# Patient Record
Sex: Female | Born: 1960 | Race: White | Hispanic: No | Marital: Married | Smoking: Never smoker
Health system: Southern US, Community
[De-identification: ages and names within clinical notes are randomized; demographics above are authoritative.]

## PROBLEM LIST (undated history)

## (undated) DIAGNOSIS — C9590 Leukemia, unspecified not having achieved remission: Secondary | ICD-10-CM

## (undated) DIAGNOSIS — I10 Essential (primary) hypertension: Secondary | ICD-10-CM

## (undated) DIAGNOSIS — E119 Type 2 diabetes mellitus without complications: Secondary | ICD-10-CM

## (undated) DIAGNOSIS — E039 Hypothyroidism, unspecified: Secondary | ICD-10-CM

## (undated) HISTORY — PX: MOHS SURGERY: SHX181

## (undated) HISTORY — DX: Essential (primary) hypertension: I10

## (undated) HISTORY — DX: Leukemia, unspecified not having achieved remission: C95.90

## (undated) HISTORY — DX: Hypothyroidism, unspecified: E03.9

## (undated) HISTORY — PX: OTHER SURGICAL HISTORY: SHX169

---

## 1999-07-09 ENCOUNTER — Other Ambulatory Visit: Admission: RE | Admit: 1999-07-09 | Discharge: 1999-07-09 | Payer: Self-pay | Admitting: Emergency Medicine

## 2000-01-15 HISTORY — PX: REDUCTION MAMMAPLASTY: SUR839

## 2000-11-11 ENCOUNTER — Encounter (INDEPENDENT_AMBULATORY_CARE_PROVIDER_SITE_OTHER): Payer: Self-pay

## 2000-11-11 ENCOUNTER — Other Ambulatory Visit: Admission: RE | Admit: 2000-11-11 | Discharge: 2000-11-11 | Payer: Self-pay | Admitting: Plastic Surgery

## 2005-09-16 ENCOUNTER — Emergency Department (HOSPITAL_COMMUNITY): Admission: EM | Admit: 2005-09-16 | Discharge: 2005-09-16 | Payer: Self-pay | Admitting: Emergency Medicine

## 2008-04-11 ENCOUNTER — Ambulatory Visit: Payer: Self-pay | Admitting: Urology

## 2009-05-09 ENCOUNTER — Ambulatory Visit (HOSPITAL_COMMUNITY): Admission: RE | Admit: 2009-05-09 | Discharge: 2009-05-09 | Payer: Self-pay | Admitting: Nurse Practitioner

## 2009-06-14 ENCOUNTER — Ambulatory Visit (HOSPITAL_COMMUNITY): Admission: RE | Admit: 2009-06-14 | Discharge: 2009-06-14 | Payer: Self-pay | Admitting: Nurse Practitioner

## 2009-06-23 ENCOUNTER — Ambulatory Visit (HOSPITAL_COMMUNITY): Admission: RE | Admit: 2009-06-23 | Discharge: 2009-06-23 | Payer: Self-pay | Admitting: Nurse Practitioner

## 2010-02-04 ENCOUNTER — Encounter: Payer: Self-pay | Admitting: Occupational Therapy

## 2010-07-03 ENCOUNTER — Ambulatory Visit (INDEPENDENT_AMBULATORY_CARE_PROVIDER_SITE_OTHER): Payer: Managed Care, Other (non HMO) | Admitting: Gastroenterology

## 2010-07-03 ENCOUNTER — Encounter: Payer: Self-pay | Admitting: Gastroenterology

## 2010-07-03 VITALS — BP 120/78 | HR 77 | Temp 97.4°F | Ht 63.0 in | Wt 143.8 lb

## 2010-07-03 DIAGNOSIS — Z1211 Encounter for screening for malignant neoplasm of colon: Secondary | ICD-10-CM | POA: Insufficient documentation

## 2010-07-03 DIAGNOSIS — K589 Irritable bowel syndrome without diarrhea: Secondary | ICD-10-CM

## 2010-07-03 NOTE — Assessment & Plan Note (Signed)
Usually does well with avoiding trigger foods. Does not feel she needs to have any antispasmodics for her. Would advise high fiber diet.

## 2010-07-03 NOTE — Progress Notes (Signed)
Primary Care Physician:  Ninfa Linden, NP, NP  Primary Gastroenterologist:  Jonette Eva, MD  Chief Complaint  Patient presents with  . Colonoscopy    HPI:  Kristin Good is a 50 y.o. female here to schedule colonoscopy. She has a history of irritable bowel syndrome. She usually controls it with her diet. Denies any melena or rectal bleeding. No abdominal pain, vomiting, heartburn, dysphagia. No family history of colon cancer. No prior history of colonoscopy.  Current Outpatient Prescriptions  Medication Sig Dispense Refill  . calcium citrate-vitamin D (CITRACAL+D) 315-200 MG-UNIT per tablet Take 1 tablet by mouth 2 (two) times daily.        Marland Kitchen levothyroxine (SYNTHROID, LEVOTHROID) 50 MCG tablet Take 50 mcg by mouth daily.        Marland Kitchen losartan (COZAAR) 50 MG tablet Take 50 mg by mouth daily.        Marland Kitchen triamterene-hydrochlorothiazide (MAXZIDE-25) 37.5-25 MG per tablet Take 1 tablet by mouth daily.          Allergies as of 07/03/2010 - Review Complete 07/03/2010  Allergen Reaction Noted  . Penicillins  07/03/2010  . Sulfa antibiotics  07/03/2010    Past Medical History  Diagnosis Date  . Hypothyroidism   . HTN (hypertension)   . Leukemia     age 31    Past Surgical History  Procedure Date  . Infertility surgery   . Mohs surgery     basal cell carcinoma    Family History  Problem Relation Age of Onset  . Colon cancer Neg Hx   . Liver disease Neg Hx   . Heart disease Mother   . Diabetes Father     History   Social History  . Marital Status: Married    Spouse Name: N/A    Number of Children: 0  . 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 Smoking status: Never Smoker   . Smokeless tobacco: Not on file  . Alcohol Use: Yes     once or twice a year  . Drug Use: No  . Sexually Active: Not on file     ROS:  General: Negative for anorexia, weight loss, fever, chills, fatigue,  weakness. Eyes: Negative for vision changes.  ENT: Negative for hoarseness, difficulty swallowing , nasal congestion. CV: Negative for chest pain, angina, palpitations, dyspnea on exertion, peripheral edema.  Respiratory: Negative for dyspnea at rest, dyspnea on exertion, cough, sputum, wheezing.  GI: See history of present illness. GU:  Negative for dysuria, hematuria, urinary incontinence, urinary frequency, nocturnal urination.  MS: Negative for joint pain, low back pain.  Derm: Negative for rash or itching.  Neuro: Negative for weakness, abnormal sensation, seizure, frequent headaches, memory loss, confusion.  Psych: Negative for anxiety, depression, suicidal ideation, hallucinations.  Endo: Negative for unusual weight change.  Heme: Negative for bruising or bleeding. Allergy: Negative for rash or hives.    Physical Examination:  BP 120/78  Pulse 77  Temp(Src) 97.4 F (36.3 C) (Temporal)  Ht 5\' 3"  (1.6 m)  Wt 143 lb 12.8 oz (65.227 kg)  BMI 25.47 kg/m2   General: Well-nourished, well-developed in no acute distress.  Head: Normocephalic, atraumatic.   Eyes: Conjunctiva pink, no icterus. Mouth: Oropharyngeal mucosa moist and pink , no lesions erythema or exudate. Neck: Supple without thyromegaly, masses, or lymphadenopathy.  Lungs: Clear to auscultation bilaterally.  Heart: Regular rate and rhythm,  no murmurs rubs or gallops.  Abdomen: Bowel sounds are normal, nontender, nondistended, no hepatosplenomegaly or masses, no abdominal bruits or    hernia , no rebound or guarding.   Extremities: No lower extremity edema.  Neuro: Alert and oriented x 4 , grossly normal neurologically.  Skin: Warm and dry, no rash or jaundice.   Psych: Alert and cooperative, normal mood and affect.

## 2010-07-03 NOTE — Progress Notes (Signed)
Cc to PCP 

## 2010-07-03 NOTE — Assessment & Plan Note (Signed)
Due for first time screening colonoscopy. I have discussed the risks, alternatives, benefits with regards to but not limited to the risk of reaction to medication, bleeding, infection, perforation and the patient is agreeable to proceed. Written consent to be obtained.

## 2010-07-10 NOTE — Progress Notes (Signed)
agree

## 2010-07-26 MED ORDER — SODIUM CHLORIDE 0.45 % IV SOLN: Freq: Once | INTRAVENOUS | Status: DC

## 2010-07-27 ENCOUNTER — Encounter (HOSPITAL_COMMUNITY)
Admission: RE | Disposition: A | Discharge status: Home / Self Care | Payer: Self-pay | Source: Ambulatory Visit | Attending: Gastroenterology

## 2010-07-27 ENCOUNTER — Encounter: Payer: Managed Care, Other (non HMO) | Admitting: Gastroenterology

## 2010-07-27 ENCOUNTER — Encounter (HOSPITAL_COMMUNITY): Payer: Self-pay | Admitting: *Deleted

## 2010-07-27 ENCOUNTER — Ambulatory Visit (HOSPITAL_COMMUNITY)
Admission: RE | Admit: 2010-07-27 | Discharge: 2010-07-27 | Disposition: A | Discharge status: Home / Self Care | Payer: Managed Care, Other (non HMO) | Source: Ambulatory Visit | Attending: Gastroenterology | Admitting: Gastroenterology

## 2010-07-27 DIAGNOSIS — K648 Other hemorrhoids: Secondary | ICD-10-CM | POA: Insufficient documentation

## 2010-07-27 DIAGNOSIS — Z1211 Encounter for screening for malignant neoplasm of colon: Secondary | ICD-10-CM

## 2010-07-27 HISTORY — PX: COLONOSCOPY: SHX5424

## 2010-07-27 SURGERY — COLONOSCOPY
Anesthesia: Moderate Sedation

## 2010-07-27 MED ORDER — MIDAZOLAM HCL 5 MG/5ML IJ SOLN
INTRAMUSCULAR | Status: AC
  Filled 2010-07-27: qty 5

## 2010-07-27 MED ORDER — MEPERIDINE HCL 100 MG/2ML IJ SOLN
INTRAMUSCULAR | Status: DC | PRN
  Administered 2010-07-27: 25 mg via INTRAMUSCULAR
  Administered 2010-07-27: 50 mg via INTRAMUSCULAR

## 2010-07-27 MED ORDER — MIDAZOLAM HCL 5 MG/5ML IJ SOLN
INTRAMUSCULAR | Status: DC | PRN
  Administered 2010-07-27 (×2): 2 mg via INTRAVENOUS

## 2010-07-27 MED ORDER — MEPERIDINE HCL 100 MG/ML IJ SOLN
INTRAMUSCULAR | Status: AC
  Filled 2010-07-27: qty 1

## 2010-07-27 MED ORDER — STERILE WATER FOR IRRIGATION IR SOLN
Status: DC | PRN
  Administered 2010-07-27: 09:00:00

## 2010-07-27 MED FILL — Meperidine HCl Inj 50 MG/ML: INTRAMUSCULAR | Qty: 1 | Status: AC

## 2010-07-27 NOTE — H&P (Signed)
No changes

## 2010-07-27 NOTE — Progress Notes (Signed)
error 

## 2010-08-02 NOTE — H&P (Signed)
Tana Coast, PA  07/03/2010  2:31 PM  Signed Primary Care Physician:  Ninfa Linden, NP, NP   Primary Gastroenterologist:  Jonette Eva, MD    Chief Complaint   Patient presents with   .  Colonoscopy      HPI:  Kristin Good is a 50 y.o. female here to schedule colonoscopy. She has a history of irritable bowel syndrome. She usually controls it with her diet. Denies any melena or rectal bleeding. No abdominal pain, vomiting, heartburn, dysphagia. No family history of colon cancer. No prior history of colonoscopy.    Current Outpatient Prescriptions   Medication  Sig  Dispense  Refill   .  calcium citrate-vitamin D (CITRACAL+D) 315-200 MG-UNIT per tablet  Take 1 tablet by mouth 2 (two) times daily.           Marland Kitchen  levothyroxine (SYNTHROID, LEVOTHROID) 50 MCG tablet  Take 50 mcg by mouth daily.           Marland Kitchen  losartan (COZAAR) 50 MG tablet  Take 50 mg by mouth daily.           Marland Kitchen  triamterene-hydrochlorothiazide (MAXZIDE-25) 37.5-25 MG per tablet  Take 1 tablet by mouth daily.               Allergies as of 07/03/2010 - Review Complete 07/03/2010   Allergen  Reaction  Noted   .  Penicillins    07/03/2010   .  Sulfa antibiotics    07/03/2010       Past Medical History   Diagnosis  Date   .  Hypothyroidism     .  HTN (hypertension)     .  Leukemia         age 66       Past Surgical History   Procedure  Date   .  Infertility surgery     .  Mohs surgery         basal cell carcinoma       Family History   Problem  Relation  Age of Onset   .  Colon cancer  Neg Hx     .  Liver disease  Neg Hx     .  Heart disease  Mother     .  Diabetes  Father         History       Social History   .  Marital Status:  Married       Spouse Name:  N/A       Number of Children:  0   .  Years of Education:  N/A       Occupational History   .  [employer]         Nurse       Social History Main Topics   .  Smoking status:  Never Smoker    .  Smokeless tobacco:  Not on file    .  Alcohol Use:  Yes         once or twice a year   .  Drug Use:  No   .  Sexually Active:  Not on file       ROS:   General: Negative for anorexia, weight loss, fever, chills, fatigue, weakness. Eyes: Negative for vision changes.   ENT: Negative for hoarseness, difficulty swallowing , nasal congestion. CV: Negative for chest pain, angina, palpitations, dyspnea on exertion, peripheral edema.   Respiratory: Negative for dyspnea at rest,  dyspnea on exertion, cough, sputum, wheezing.   GI: See history of present illness. GU:  Negative for dysuria, hematuria, urinary incontinence, urinary frequency, nocturnal urination.   MS: Negative for joint pain, low back pain.   Derm: Negative for rash or itching.   Neuro: Negative for weakness, abnormal sensation, seizure, frequent headaches, memory loss, confusion.   Psych: Negative for anxiety, depression, suicidal ideation, hallucinations.   Endo: Negative for unusual weight change.   Heme: Negative for bruising or bleeding. Allergy: Negative for rash or hives.     Physical Examination:   BP 120/78  Pulse 77  Temp(Src) 97.4 F (36.3 C) (Temporal)  Ht 5\' 3"  (1.6 m)  Wt 143 lb 12.8 oz (65.227 kg)  BMI 25.47 kg/m2    General: Well-nourished, well-developed in no acute distress.   Head: Normocephalic, atraumatic.    Eyes: Conjunctiva pink, no icterus. Mouth: Oropharyngeal mucosa moist and pink , no lesions erythema or exudate. Neck: Supple without thyromegaly, masses, or lymphadenopathy.   Lungs: Clear to auscultation bilaterally.   Heart: Regular rate and rhythm, no murmurs rubs or gallops.   Abdomen: Bowel sounds are normal, nontender, nondistended, no hepatosplenomegaly or masses, no abdominal bruits or    hernia , no rebound or guarding.    Extremities: No lower extremity edema.   Neuro: Alert and oriented x 4 , grossly normal neurologically.   Skin: Warm and dry, no rash or jaundice.    Psych: Alert and cooperative, normal mood  and affect.          Glendora Score  07/03/2010  2:41 PM  Signed Cc to PCP  Jonette Eva, MD  07/10/2010 11:02 AM  Signed agree        IBS (irritable bowel syndrome) - Tana Coast, PA  07/03/2010  1:52 PM  Signed Usually does well with avoiding trigger foods. Does not feel she needs to have any antispasmodics for her. Would advise high fiber diet.  Colon cancer screening - Tana Coast, PA  07/03/2010  1:56 PM  Signed Due for first time screening colonoscopy. I have discussed the risks, alternatives, benefits with regards to but not limited to the risk of reaction to medication, bleeding, infection, perforation and the patient is agreeable to proceed. Written consent to be obtained.

## 2010-08-08 ENCOUNTER — Encounter (HOSPITAL_COMMUNITY): Payer: Self-pay | Admitting: Gastroenterology

## 2010-10-23 ENCOUNTER — Other Ambulatory Visit (HOSPITAL_COMMUNITY): Payer: Self-pay | Admitting: Nurse Practitioner

## 2010-10-23 DIAGNOSIS — Z139 Encounter for screening, unspecified: Secondary | ICD-10-CM

## 2010-11-15 ENCOUNTER — Ambulatory Visit (HOSPITAL_COMMUNITY): Payer: Managed Care, Other (non HMO)

## 2011-07-11 ENCOUNTER — Ambulatory Visit (HOSPITAL_COMMUNITY)
Admission: RE | Admit: 2011-07-11 | Discharge: 2011-07-11 | Disposition: A | Discharge status: Home / Self Care | Payer: Managed Care, Other (non HMO) | Source: Ambulatory Visit | Attending: Nurse Practitioner | Admitting: Nurse Practitioner

## 2011-07-11 DIAGNOSIS — Z139 Encounter for screening, unspecified: Secondary | ICD-10-CM

## 2011-07-11 DIAGNOSIS — Z1231 Encounter for screening mammogram for malignant neoplasm of breast: Secondary | ICD-10-CM | POA: Insufficient documentation

## 2014-03-14 ENCOUNTER — Ambulatory Visit: Payer: Self-pay | Admitting: Physician Assistant

## 2015-03-29 ENCOUNTER — Other Ambulatory Visit: Payer: Self-pay | Admitting: Physician Assistant

## 2015-03-29 DIAGNOSIS — Z1231 Encounter for screening mammogram for malignant neoplasm of breast: Secondary | ICD-10-CM

## 2015-04-03 ENCOUNTER — Ambulatory Visit
Admission: RE | Admit: 2015-04-03 | Discharge: 2015-04-03 | Disposition: A | Discharge status: Home / Self Care | Payer: Commercial Managed Care - PPO | Source: Ambulatory Visit | Attending: Physician Assistant | Admitting: Physician Assistant

## 2015-04-03 DIAGNOSIS — Z1231 Encounter for screening mammogram for malignant neoplasm of breast: Secondary | ICD-10-CM | POA: Insufficient documentation

## 2016-04-01 ENCOUNTER — Other Ambulatory Visit: Payer: Self-pay | Admitting: Physician Assistant

## 2016-04-01 DIAGNOSIS — Z1231 Encounter for screening mammogram for malignant neoplasm of breast: Secondary | ICD-10-CM

## 2016-04-10 ENCOUNTER — Ambulatory Visit: Payer: Managed Care, Other (non HMO) | Attending: Physician Assistant

## 2016-05-17 ENCOUNTER — Telehealth: Payer: Self-pay | Admitting: Emergency Medicine

## 2016-05-17 NOTE — Telephone Encounter (Signed)
I received a message from answering service that "Kristin Good" has a critical INR. It is unclear if Kristin Good or Kristin Good has the critical INR. I attempted to call the patient back and left a voicemail requesting that they call back so I can discuss this with them. There are no INR lab results in system for me to review.

## 2016-09-11 ENCOUNTER — Ambulatory Visit
Admission: RE | Admit: 2016-09-11 | Discharge: 2016-09-11 | Disposition: A | Discharge status: Home / Self Care | Payer: Commercial Managed Care - PPO | Source: Ambulatory Visit | Attending: Physician Assistant | Admitting: Physician Assistant

## 2016-09-11 DIAGNOSIS — Z1231 Encounter for screening mammogram for malignant neoplasm of breast: Secondary | ICD-10-CM | POA: Insufficient documentation

## 2017-09-03 ENCOUNTER — Other Ambulatory Visit: Payer: Self-pay | Admitting: Physician Assistant

## 2017-09-03 DIAGNOSIS — Z1231 Encounter for screening mammogram for malignant neoplasm of breast: Secondary | ICD-10-CM

## 2017-09-30 ENCOUNTER — Inpatient Hospital Stay: Admission: RE | Admit: 2017-09-30 | Payer: Managed Care, Other (non HMO) | Source: Ambulatory Visit

## 2017-10-08 ENCOUNTER — Ambulatory Visit
Admission: RE | Admit: 2017-10-08 | Discharge: 2017-10-08 | Disposition: A | Discharge status: Home / Self Care | Payer: Commercial Managed Care - PPO | Source: Ambulatory Visit | Attending: Physician Assistant | Admitting: Physician Assistant

## 2017-10-08 DIAGNOSIS — Z1231 Encounter for screening mammogram for malignant neoplasm of breast: Secondary | ICD-10-CM | POA: Diagnosis not present

## 2018-01-18 ENCOUNTER — Emergency Department (HOSPITAL_COMMUNITY): Payer: Commercial Managed Care - PPO

## 2018-01-18 ENCOUNTER — Encounter (HOSPITAL_COMMUNITY): Payer: Self-pay | Admitting: Emergency Medicine

## 2018-01-18 ENCOUNTER — Other Ambulatory Visit: Payer: Self-pay

## 2018-01-18 ENCOUNTER — Inpatient Hospital Stay (HOSPITAL_COMMUNITY)
Admission: EM | Admit: 2018-01-18 | Discharge: 2018-01-24 | DRG: 840 | Disposition: A | Discharge status: Home / Self Care | Payer: Commercial Managed Care - PPO | Attending: Internal Medicine | Admitting: Internal Medicine

## 2018-01-18 DIAGNOSIS — I10 Essential (primary) hypertension: Secondary | ICD-10-CM | POA: Diagnosis not present

## 2018-01-18 DIAGNOSIS — Z79890 Hormone replacement therapy (postmenopausal): Secondary | ICD-10-CM

## 2018-01-18 DIAGNOSIS — D763 Other histiocytosis syndromes: Secondary | ICD-10-CM | POA: Diagnosis present

## 2018-01-18 DIAGNOSIS — C8590 Non-Hodgkin lymphoma, unspecified, unspecified site: Principal | ICD-10-CM | POA: Diagnosis present

## 2018-01-18 DIAGNOSIS — D61818 Other pancytopenia: Secondary | ICD-10-CM

## 2018-01-18 DIAGNOSIS — Z794 Long term (current) use of insulin: Secondary | ICD-10-CM

## 2018-01-18 DIAGNOSIS — E785 Hyperlipidemia, unspecified: Secondary | ICD-10-CM | POA: Diagnosis present

## 2018-01-18 DIAGNOSIS — Z923 Personal history of irradiation: Secondary | ICD-10-CM

## 2018-01-18 DIAGNOSIS — N179 Acute kidney failure, unspecified: Secondary | ICD-10-CM | POA: Diagnosis not present

## 2018-01-18 DIAGNOSIS — R740 Nonspecific elevation of levels of transaminase and lactic acid dehydrogenase [LDH]: Secondary | ICD-10-CM | POA: Diagnosis not present

## 2018-01-18 DIAGNOSIS — E43 Unspecified severe protein-calorie malnutrition: Secondary | ICD-10-CM

## 2018-01-18 DIAGNOSIS — R509 Fever, unspecified: Secondary | ICD-10-CM | POA: Diagnosis not present

## 2018-01-18 DIAGNOSIS — E860 Dehydration: Secondary | ICD-10-CM | POA: Insufficient documentation

## 2018-01-18 DIAGNOSIS — Z85820 Personal history of malignant melanoma of skin: Secondary | ICD-10-CM | POA: Diagnosis not present

## 2018-01-18 DIAGNOSIS — E039 Hypothyroidism, unspecified: Secondary | ICD-10-CM | POA: Diagnosis present

## 2018-01-18 DIAGNOSIS — B9710 Unspecified enterovirus as the cause of diseases classified elsewhere: Secondary | ICD-10-CM | POA: Diagnosis present

## 2018-01-18 DIAGNOSIS — B9789 Other viral agents as the cause of diseases classified elsewhere: Secondary | ICD-10-CM | POA: Diagnosis present

## 2018-01-18 DIAGNOSIS — E538 Deficiency of other specified B group vitamins: Secondary | ICD-10-CM | POA: Diagnosis present

## 2018-01-18 DIAGNOSIS — E119 Type 2 diabetes mellitus without complications: Secondary | ICD-10-CM | POA: Diagnosis present

## 2018-01-18 DIAGNOSIS — A419 Sepsis, unspecified organism: Secondary | ICD-10-CM | POA: Diagnosis not present

## 2018-01-18 DIAGNOSIS — R7401 Elevation of levels of liver transaminase levels: Secondary | ICD-10-CM

## 2018-01-18 DIAGNOSIS — Z9221 Personal history of antineoplastic chemotherapy: Secondary | ICD-10-CM | POA: Diagnosis not present

## 2018-01-18 DIAGNOSIS — Z79899 Other long term (current) drug therapy: Secondary | ICD-10-CM | POA: Diagnosis not present

## 2018-01-18 DIAGNOSIS — R161 Splenomegaly, not elsewhere classified: Secondary | ICD-10-CM | POA: Diagnosis present

## 2018-01-18 DIAGNOSIS — Z6822 Body mass index (BMI) 22.0-22.9, adult: Secondary | ICD-10-CM | POA: Diagnosis not present

## 2018-01-18 DIAGNOSIS — R6520 Severe sepsis without septic shock: Secondary | ICD-10-CM | POA: Diagnosis present

## 2018-01-18 DIAGNOSIS — Z01810 Encounter for preprocedural cardiovascular examination: Secondary | ICD-10-CM | POA: Diagnosis not present

## 2018-01-18 DIAGNOSIS — Z452 Encounter for adjustment and management of vascular access device: Secondary | ICD-10-CM | POA: Diagnosis not present

## 2018-01-18 DIAGNOSIS — Z856 Personal history of leukemia: Secondary | ICD-10-CM | POA: Diagnosis not present

## 2018-01-18 HISTORY — DX: Type 2 diabetes mellitus without complications: E11.9

## 2018-01-18 LAB — CBC WITH DIFFERENTIAL/PLATELET
ABS IMMATURE GRANULOCYTES: 0.01 10*3/uL (ref 0.00–0.07)
Basophils Absolute: 0 10*3/uL (ref 0.0–0.1)
Basophils Relative: 0 %
Eosinophils Absolute: 0 10*3/uL (ref 0.0–0.5)
Eosinophils Relative: 0 %
HCT: 16.2 % — ABNORMAL LOW (ref 36.0–46.0)
Hemoglobin: 4.9 g/dL — CL (ref 12.0–15.0)
IMMATURE GRANULOCYTES: 1 %
Lymphocytes Relative: 73 %
Lymphs Abs: 0.5 10*3/uL — ABNORMAL LOW (ref 0.7–4.0)
MCH: 30.2 pg (ref 26.0–34.0)
MCHC: 30.2 g/dL (ref 30.0–36.0)
MCV: 100 fL (ref 80.0–100.0)
Monocytes Absolute: 0 10*3/uL — ABNORMAL LOW (ref 0.1–1.0)
Monocytes Relative: 4 %
NEUTROS ABS: 0.2 10*3/uL — AB (ref 1.7–7.7)
Neutrophils Relative %: 22 %
Platelets: 29 10*3/uL — CL (ref 150–400)
RBC: 1.62 MIL/uL — ABNORMAL LOW (ref 3.87–5.11)
RDW: 18.5 % — ABNORMAL HIGH (ref 11.5–15.5)
WBC: 0.7 10*3/uL — CL (ref 4.0–10.5)
nRBC: 0 % (ref 0.0–0.2)

## 2018-01-18 LAB — URINALYSIS, ROUTINE W REFLEX MICROSCOPIC
Bilirubin Urine: NEGATIVE
Glucose, UA: NEGATIVE mg/dL
Hgb urine dipstick: NEGATIVE
Ketones, ur: NEGATIVE mg/dL
Leukocytes, UA: NEGATIVE
Nitrite: NEGATIVE
Protein, ur: NEGATIVE mg/dL
Specific Gravity, Urine: 1.011 (ref 1.005–1.030)
pH: 6 (ref 5.0–8.0)

## 2018-01-18 LAB — VITAMIN B12: VITAMIN B 12: 94 pg/mL — AB (ref 180–914)

## 2018-01-18 LAB — APTT: aPTT: 32 seconds (ref 24–36)

## 2018-01-18 LAB — RETICULOCYTES
Immature Retic Fract: 16.3 % — ABNORMAL HIGH (ref 2.3–15.9)
RBC.: 1.62 MIL/uL — ABNORMAL LOW (ref 3.87–5.11)
Retic Count, Absolute: 74.4 10*3/uL (ref 19.0–186.0)
Retic Ct Pct: 4.6 % — ABNORMAL HIGH (ref 0.4–3.1)

## 2018-01-18 LAB — TROPONIN I

## 2018-01-18 LAB — IRON AND TIBC
Iron: 15 ug/dL — ABNORMAL LOW (ref 28–170)
Saturation Ratios: 5 % — ABNORMAL LOW (ref 10.4–31.8)
TIBC: 283 ug/dL (ref 250–450)
UIBC: 268 ug/dL

## 2018-01-18 LAB — I-STAT CG4 LACTIC ACID, ED
Lactic Acid, Venous: 1.98 mmol/L — ABNORMAL HIGH (ref 0.5–1.9)
Lactic Acid, Venous: 1.84 mmol/L (ref 0.5–1.9)

## 2018-01-18 LAB — GLUCOSE, CAPILLARY
Glucose-Capillary: 145 mg/dL — ABNORMAL HIGH (ref 70–99)
Glucose-Capillary: 147 mg/dL — ABNORMAL HIGH (ref 70–99)

## 2018-01-18 LAB — COMPREHENSIVE METABOLIC PANEL
ALT: 285 U/L — ABNORMAL HIGH (ref 0–44)
AST: 30 U/L (ref 15–41)
Albumin: 3.2 g/dL — ABNORMAL LOW (ref 3.5–5.0)
Alkaline Phosphatase: 211 U/L — ABNORMAL HIGH (ref 38–126)
Anion gap: 7 (ref 5–15)
BUN: 37 mg/dL — AB (ref 6–20)
CHLORIDE: 109 mmol/L (ref 98–111)
CO2: 17 mmol/L — ABNORMAL LOW (ref 22–32)
Calcium: 8.1 mg/dL — ABNORMAL LOW (ref 8.9–10.3)
Creatinine, Ser: 1.51 mg/dL — ABNORMAL HIGH (ref 0.44–1.00)
GFR calc Af Amer: 43 mL/min — ABNORMAL LOW (ref >60)
GFR calc non Af Amer: 37 mL/min — ABNORMAL LOW (ref >60)
Glucose, Bld: 254 mg/dL — ABNORMAL HIGH (ref 70–99)
Potassium: 3.8 mmol/L (ref 3.5–5.1)
Sodium: 133 mmol/L — ABNORMAL LOW (ref 135–145)
Total Bilirubin: 1.2 mg/dL (ref 0.3–1.2)
Total Protein: 5.4 g/dL — ABNORMAL LOW (ref 6.5–8.1)

## 2018-01-18 LAB — FIBRINOGEN: Fibrinogen: 302 mg/dL (ref 210–475)

## 2018-01-18 LAB — PROTIME-INR
INR: 1.23
Prothrombin Time: 15.4 seconds — ABNORMAL HIGH (ref 11.4–15.2)

## 2018-01-18 LAB — PREPARE RBC (CROSSMATCH)

## 2018-01-18 LAB — PROCALCITONIN: PROCALCITONIN: 0.35 ng/mL

## 2018-01-18 LAB — FOLATE: Folate: 18.9 ng/mL (ref >5.9)

## 2018-01-18 LAB — INFLUENZA PANEL BY PCR (TYPE A & B)
Influenza A By PCR: NEGATIVE
Influenza B By PCR: NEGATIVE

## 2018-01-18 LAB — ABO/RH: ABO/RH(D): B POS

## 2018-01-18 LAB — LACTATE DEHYDROGENASE: LDH: 255 U/L — AB (ref 98–192)

## 2018-01-18 LAB — MRSA PCR SCREENING: MRSA BY PCR: POSITIVE — AB

## 2018-01-18 LAB — BRAIN NATRIURETIC PEPTIDE: B Natriuretic Peptide: 60 pg/mL (ref 0.0–100.0)

## 2018-01-18 LAB — FERRITIN: Ferritin: 842 ng/mL — ABNORMAL HIGH (ref 11–307)

## 2018-01-18 MED ORDER — KETOROLAC TROMETHAMINE 15 MG/ML IJ SOLN
15.0000 mg | Freq: Once | INTRAMUSCULAR | Status: AC
  Administered 2018-01-18: 15 mg via INTRAVENOUS
  Filled 2018-01-18: qty 1

## 2018-01-18 MED ORDER — SODIUM CHLORIDE 0.9% IV SOLUTION
Freq: Once | INTRAVENOUS | Status: AC
  Administered 2018-01-18: 17:00:00 via INTRAVENOUS

## 2018-01-18 MED ORDER — CHLORHEXIDINE GLUCONATE CLOTH 2 % EX PADS
6.0000 | MEDICATED_PAD | Freq: Every day | CUTANEOUS | Status: AC
  Administered 2018-01-19 – 2018-01-23 (×4): 6 via TOPICAL

## 2018-01-18 MED ORDER — SODIUM CHLORIDE 0.9 % IV BOLUS (SEPSIS)
1000.0000 mL | Freq: Once | INTRAVENOUS | Status: AC
  Administered 2018-01-18: 1000 mL via INTRAVENOUS

## 2018-01-18 MED ORDER — CYANOCOBALAMIN 1000 MCG/ML IJ SOLN
1000.0000 ug | Freq: Once | INTRAMUSCULAR | Status: AC
  Administered 2018-01-18: 1000 ug via INTRAMUSCULAR
  Filled 2018-01-18: qty 1

## 2018-01-18 MED ORDER — ACETAMINOPHEN 650 MG RE SUPP: 650.0000 mg | Freq: Four times a day (QID) | RECTAL | Status: DC | PRN

## 2018-01-18 MED ORDER — INSULIN ASPART 100 UNIT/ML ~~LOC~~ SOLN
0.0000 [IU] | Freq: Three times a day (TID) | SUBCUTANEOUS | Status: DC
  Administered 2018-01-18 – 2018-01-19 (×2): 2 [IU] via SUBCUTANEOUS
  Administered 2018-01-19: 3 [IU] via SUBCUTANEOUS

## 2018-01-18 MED ORDER — SODIUM CHLORIDE 0.9 % IV SOLN
2.0000 g | Freq: Once | INTRAVENOUS | Status: AC
  Administered 2018-01-18: 2 g via INTRAVENOUS
  Filled 2018-01-18: qty 2

## 2018-01-18 MED ORDER — INSULIN GLARGINE 100 UNIT/ML ~~LOC~~ SOLN
20.0000 [IU] | Freq: Every day | SUBCUTANEOUS | Status: DC
  Administered 2018-01-18: 20 [IU] via SUBCUTANEOUS
  Filled 2018-01-18 (×3): qty 0.2

## 2018-01-18 MED ORDER — INSULIN ASPART 100 UNIT/ML ~~LOC~~ SOLN: 0.0000 [IU] | Freq: Every day | SUBCUTANEOUS | Status: DC

## 2018-01-18 MED ORDER — SODIUM CHLORIDE 0.9 % IV SOLN
1000.0000 mL | INTRAVENOUS | Status: DC
  Administered 2018-01-18: 1000 mL via INTRAVENOUS

## 2018-01-18 MED ORDER — ACETAMINOPHEN 500 MG PO TABS
1000.0000 mg | ORAL_TABLET | Freq: Once | ORAL | Status: AC
  Administered 2018-01-18: 1000 mg via ORAL
  Filled 2018-01-18: qty 2

## 2018-01-18 MED ORDER — MUPIROCIN 2 % EX OINT
1.0000 "application " | TOPICAL_OINTMENT | Freq: Two times a day (BID) | CUTANEOUS | Status: AC
  Administered 2018-01-18 – 2018-01-22 (×9): 1 via NASAL
  Filled 2018-01-18: qty 22

## 2018-01-18 MED ORDER — LEVOTHYROXINE SODIUM 50 MCG PO TABS
50.0000 ug | ORAL_TABLET | Freq: Every day | ORAL | Status: DC
  Administered 2018-01-19 – 2018-01-24 (×6): 50 ug via ORAL
  Filled 2018-01-18: qty 1
  Filled 2018-01-18 (×2): qty 2
  Filled 2018-01-18: qty 1
  Filled 2018-01-18 (×3): qty 2

## 2018-01-18 MED ORDER — METRONIDAZOLE IN NACL 5-0.79 MG/ML-% IV SOLN
500.0000 mg | Freq: Three times a day (TID) | INTRAVENOUS | Status: DC
  Administered 2018-01-18 – 2018-01-19 (×3): 500 mg via INTRAVENOUS
  Filled 2018-01-18 (×3): qty 100

## 2018-01-18 MED ORDER — POTASSIUM CHLORIDE IN NACL 20-0.9 MEQ/L-% IV SOLN
INTRAVENOUS | Status: DC
  Administered 2018-01-18 – 2018-01-22 (×6): via INTRAVENOUS

## 2018-01-18 MED ORDER — VANCOMYCIN HCL IN DEXTROSE 1-5 GM/200ML-% IV SOLN
1000.0000 mg | Freq: Once | INTRAVENOUS | Status: AC
  Administered 2018-01-18: 1000 mg via INTRAVENOUS
  Filled 2018-01-18: qty 200

## 2018-01-18 MED ORDER — SODIUM CHLORIDE 0.9 % IV SOLN
2.0000 g | Freq: Three times a day (TID) | INTRAVENOUS | Status: DC
  Administered 2018-01-18 – 2018-01-19 (×2): 2 g via INTRAVENOUS
  Filled 2018-01-18 (×8): qty 2

## 2018-01-18 MED ORDER — VANCOMYCIN HCL 500 MG IV SOLR
500.0000 mg | INTRAVENOUS | Status: DC
  Administered 2018-01-19: 500 mg via INTRAVENOUS
  Filled 2018-01-18 (×3): qty 500

## 2018-01-18 MED ORDER — ACETAMINOPHEN 325 MG PO TABS
650.0000 mg | ORAL_TABLET | Freq: Four times a day (QID) | ORAL | Status: DC | PRN
  Administered 2018-01-18 – 2018-01-23 (×5): 650 mg via ORAL
  Filled 2018-01-18 (×7): qty 2

## 2018-01-18 MED ORDER — SODIUM CHLORIDE 0.9 % IV BOLUS (SEPSIS)
500.0000 mL | Freq: Once | INTRAVENOUS | Status: AC
  Administered 2018-01-18: 500 mL via INTRAVENOUS

## 2018-01-18 MED ORDER — ONDANSETRON HCL 4 MG/2ML IJ SOLN: 4.0000 mg | Freq: Four times a day (QID) | INTRAMUSCULAR | Status: DC | PRN

## 2018-01-18 MED ORDER — SODIUM CHLORIDE 0.9 % IV BOLUS (SEPSIS)
250.0000 mL | Freq: Once | INTRAVENOUS | Status: AC
  Administered 2018-01-18: 250 mL via INTRAVENOUS

## 2018-01-18 MED ORDER — ONDANSETRON HCL 4 MG PO TABS: 4.0000 mg | ORAL_TABLET | Freq: Four times a day (QID) | ORAL | Status: DC | PRN

## 2018-01-18 NOTE — Progress Notes (Addendum)
Pharmacy Antibiotic Note  Kristin Good is a 58 y.o. female admitted on 01/18/2018 with sepsis.  Pharmacy has been consulted for vancomycin, meropenem  and aztreonam dosing.  Lactic acid is 1.98.  Plan:  start aztreonam 2g IV q8h  Start meropenem 1g IV q12h  Loading dose:  vancomycin 1g IV x1 dose Maintenance dose: vancomycin 500mg  IV q24h  Goal vancomycin trough range: 15-20   mcg/mL Pharmacy will continue to monitor renal function, vancomycin troughs as clinically indicated, cultures and patient progress.   Height: 5\' 3"  (160 cm) Weight: 121 lb (54.9 kg) IBW/kg (Calculated) : 52.4  Temp (24hrs), Avg:101 F (38.3 C), Min:99.4 F (37.4 C), Max:102.6 F (39.2 C)  Recent Labs  Lab 01/18/18 1216 01/18/18 1227  WBC 0.7*  --   CREATININE 1.51*  --   LATICACIDVEN  --  1.98*    Estimated Creatinine Clearance: 33.2 mL/min (A) (by C-G formula based on SCr of 1.51 mg/dL (H)).    Allergies  Allergen Reactions  . Doxycycline Anaphylaxis  . Penicillins Anaphylaxis    Has taken Keflex in past with no issues.  . Levofloxacin Nausea And Vomiting  . Sulfa Antibiotics     Antimicrobials this admission: aztreonam 1/5 >>  vancomycin 1/5 >>   Metronidazole 1/5>> Meropenem 1/5>>   Microbiology results: 1/5 BC x2: in progress 1/5 UCx: sent       Thank you for allowing pharmacy to be a part of this patient's care.  Despina Pole 01/18/2018 1:51 PM

## 2018-01-18 NOTE — ED Provider Notes (Signed)
[facility] EMERGENCY DEPARTMENT Provider Note   CSN: [id number] Arrival date & time: 01/18/18  1103     History   Chief Complaint Chief Complaint  Patient presents with  . Weakness    HPI Kristin Good is a 58 y.o. female.  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  Reportedly according to the family members and the patient she started having fevers night sweats and feeling generally weak starting about 1 month ago.  Ultimately seen at her doctor's office thought to be viral, went [place] the [place] at [facility] and had an ER visit because of similar symptoms of fever and night sweats, had lab work and a work-up that showed no acute findings according to the patient.  Told it was viral and supportive care was indicated.  Unfortunately she has lost about 20 pounds over the last month, her symptoms have been persistent, gradually worsening and have now become severe.  She has no associated significant cough, dysuria, diarrhea, rectal bleeding but has had associated pale appearance, generalized weakness, severe progressive fatigue.  She denies taking any blood thinners but does note a history of chronic mild thrombocytopenia with platelet levels in the 130,000 range.  In the last week she has had a couple of nosebleeds including this morning.  It is not actively bleeding.  Past Medical History:  Diagnosis Date  . HTN (hypertension)   . Hypothyroidism   . Leukemia [facility])    age 29    Patient Active Problem List   Diagnosis Date Noted  . IBS (irritable bowel syndrome) 07/03/2010  . Colon cancer screening 07/03/2010    Past Surgical History:  Procedure Laterality Date  . COLONOSCOPY  07/27/2010   Procedure: COLONOSCOPY;  Surgeon: Dorothyann Peng, MD;  Location: [facility]  ENDO SUITE;  Service: Endoscopy;  Laterality: N/A;  . infertility surgery    . MOHS SURGERY     basal cell carcinoma  . REDUCTION MAMMAPLASTY Bilateral 2002     OB History   No obstetric history on file.      Home Medications    Prior to Admission medications   Medication Sig Start Date End Date Taking? Authorizing Provider  calcium citrate-vitamin D (CITRACAL+D) 315-200 MG-UNIT per tablet Take 1 tablet by mouth 2 (two) times daily.      [provider]  levothyroxine (SYNTHROID, LEVOTHROID) 50 MCG tablet Take 50 mcg by mouth daily.      [provider]  losartan (COZAAR) 50 MG tablet Take 50 mg by mouth daily.      [provider]  triamterene-hydrochlorothiazide (MAXZIDE-25) 37.5-25 MG per tablet Take 1 tablet by mouth daily.      [provider]    Family History Family History  Problem Relation Age of Onset  . Heart disease Mother   . Diabetes Father   . Breast cancer Sister 63  . Breast cancer Maternal Aunt   . Colon cancer Neg Hx   . Liver disease Neg Hx     Social History Social History   Tobacco Use  . Smoking status: Never Smoker  . Smokeless tobacco: Never Used  Substance Use Topics  . Alcohol use: Yes    Comment: once or twice a year  . Drug use:  No     Allergies   Penicillins and Sulfa antibiotics   Review of Systems Review of Systems  All other systems reviewed and are negative.    Physical Exam Updated Vital Signs BP (!) 86/51 (BP Location: Left Arm)   Pulse (!) 106   Temp 99.4 F (37.4 C) (Oral)   Resp 18   Ht 1.6 m (_0 )   Wt 54.9 kg   SpO2 100%   BMI 21.43 kg/m   Physical Exam Constitutional:      Comments: Very pale, ill-appearing, hypotensive and tachycardic  HENT:     Head:     Comments: Pale mucous membranes, oropharynx is clear, moist, no obstructions, normal phonation    Nose: Nose normal.     Mouth/Throat:     Pharynx: Oropharynx is clear. No oropharyngeal exudate or posterior  oropharyngeal erythema.  Eyes:     General: No scleral icterus.       Right eye: No discharge.        Left eye: No discharge.     Pupils: Pupils are equal, round, and reactive to light.     Comments: No jaundice but patient is severely pale  Neck:     Musculoskeletal: Normal range of motion and neck supple. No neck rigidity or muscular tenderness.     Vascular: No carotid bruit.  Cardiovascular:     Comments: Soft systolic murmur, tachycardic to 110, weak peripheral pulses, edema present bilaterally at ankles Pulmonary:     Comments: Lungs clear, no rales, no wheezing, no increased work of breathing Abdominal:     Comments: Soft nontender abdomen  Musculoskeletal:     Comments: Bilateral mild edema which is symmetrical in the lower extremities as well as the right upper extremity, the patient states the upper extremity and lower extremities are chronically swollen since her teenage years  Lymphadenopathy:     Cervical: No cervical adenopathy.  Skin:    Coloration: Skin is pale.     Findings: No erythema or rash.  Neurological:     Comments: The patient is generalized weakness however she is able to perform all of the actions I asked her including sitting up in the bed.  There is no facial droop, clear speech, normal coordination.      ED Treatments / Results  Labs (all labs ordered are listed, but only abnormal results are displayed) Labs Reviewed  CULTURE, BLOOD (ROUTINE X 2)  CULTURE, BLOOD (ROUTINE X 2)  URINE CULTURE  COMPREHENSIVE METABOLIC PANEL  CBC WITH DIFFERENTIAL/PLATELET  URINALYSIS, ROUTINE W REFLEX MICROSCOPIC  VITAMIN B12  FOLATE  IRON AND TIBC  FERRITIN  RETICULOCYTES  PROCALCITONIN  BRAIN NATRIURETIC PEPTIDE  TROPONIN I  I-STAT CG4 LACTIC ACID, ED    EKG None  Radiology No results found.  Procedures .Critical Care Performed by: Noemi Chapel, MD Authorized by: Noemi Chapel, MD   Critical care provider statement:    Critical care time  (minutes):  35   Critical care time was exclusive of:  Separately billable procedures and treating other patients and teaching time   Critical care was necessary to treat or prevent imminent or life-threatening deterioration of the following conditions:  Shock (severe anemia)   Critical care was time spent personally by me on the following activities:  Blood draw for specimens, development of treatment plan with patient or surrogate, discussions with consultants, evaluation of patient's response to treatment, examination of patient, obtaining history from patient or surrogate, ordering and performing treatments and  interventions, ordering and review of laboratory studies, ordering and review of radiographic studies, pulse oximetry, re-evaluation of patient's condition and review of old charts   (including critical care time)  Medications Ordered in ED Medications  0.9 %  sodium chloride infusion (has no administration in time range)  sodium chloride 0.9 % bolus 1,000 mL (has no administration in time range)    And  sodium chloride 0.9 % bolus 500 mL (has no administration in time range)    And  sodium chloride 0.9 % bolus 250 mL (has no administration in time range)  aztreonam (AZACTAM) 2 g in sodium chloride 0.9 % 100 mL IVPB (has no administration in time range)  metroNIDAZOLE (FLAGYL) IVPB 500 mg (has no administration in time range)  vancomycin (VANCOCIN) IVPB 1000 mg/200 mL premix (has no administration in time range)     Initial Impression / Assessment and Plan / ED Course  I have reviewed the triage vital signs and the nursing notes.  Pertinent labs & imaging results that were available during my care of the patient were reviewed by me and considered in my medical decision making (see chart for details).     The patient is very ill-appearing and I would suspect a number of different possible etiologies including an underlying infection, recurrent leukemia, she may have a blood  dyscrasia that is causing her anemia.  She has not had any visible blood in her stools and has a home health worker she is aware of what to look for and states she has not had melena hematochezia or any other bleeding.  Now with the nosebleeds the pale appearance the hypotension the tachycardia I suspect that she is severely anemic.  She will need a type and screen, work work-up for sepsis, fluid resuscitation and antibiotics.  She will need a rectal temperature, she was measuring 102.5 prior to arrival.  I have reviewed the prior medical record and the most recent lab work-up was done at [facility] in June 2019 at which time she had a hemoglobin of 12.4, platelets of 100,000 and a white blood cell count of 2900.  Today her lab values are completely different with a hemoglobin of 4.9, platelets of 29,000 and a white blood cell count of 0.7 showing neutropenia.  She has a creatinine of 1.5 and a BUN of 37 suggesting significant and worsening dehydration with some endorgan dysfunction.  I personally looked at her chest x-ray and see no signs of infiltrate on my interpretation.  The patient is critically ill with what appears to be severe sepsis, borderline shock, febrile neutropenia of an unknown source.  I discussed her care with multiple consultants including the oncologist Dr. Delton Coombes who recommends that the patient be transferred to [facility] long hospital to expedite bone marrow biopsy after the patient stabilizes.  I believe that she will likely need ICU level care at this time.  She has 2 IVs with fluid resuscitation and seems to be responding.  Lactic acid just less than 2.  Discussed with Dr. Carles Collet who is agreeable to see the patient for admission.  Transfusion orders started in the ED 30cc / kg of IVF Broad spectrum antibiotics Antipyretics  Sepsis - Repeat Assessment  Performed at:    1:46 PM   Vitals     Blood pressure (!) 95/55, pulse 100, temperature (!) 102.6 F (39.2 C), temperature  source Rectal, resp. rate (!) 22, height 1.6 m (_0 ), weight 54.9 kg, SpO2 100 %.  Heart:  Regular rate and rhythm  Lungs:    CTA  Capillary Refill:   <2 sec  Peripheral Pulse:   Radial pulse palpable  Skin:     Pale     Final Clinical Impressions(s) / ED Diagnoses   Final diagnoses:  Severe sepsis ([place])  Pancytopenia ([place])  AKI (acute kidney injury) ([place])  Dehydration  Transaminitis      Noemi Chapel, MD 01/18/18 1346

## 2018-01-18 NOTE — ED Notes (Signed)
Dr. Delton Coombes called for EDP.

## 2018-01-18 NOTE — ED Triage Notes (Signed)
Pt reports nosebleed,weakness, nausea, weight loss of 20lbs in one month.

## 2018-01-18 NOTE — Progress Notes (Signed)
Patient noted to feel warm to touch, temp 101.5 po tylenol given per orders. At current time temp 101.8 po. Dr. Olevia Bowens notified. Orders recieved

## 2018-01-18 NOTE — ED Notes (Signed)
CRITICAL VALUE ALERT  Critical Value:  WBC 0.7, Hemoglobin 4.9, Platelets 29  Date & Time Notied:  01/18/2018 1242  Provider Notified: Dr. Sabra Heck  Orders Received/Actions taken: EDP notified, no further orders given

## 2018-01-18 NOTE — H&P (Signed)
History and Physical  QUINTAVIA ROGSTAD VCB:[id number] DOB: 1960/05/16 DOA: 01/18/2018   PCP: Marinda Elk, MD   Patient coming from: Home  Chief Complaint: fever, generalized weakness  HPI:  Kristin Good is a 58 y.o. female with medical history of some type of hematologic malignancy, diabetes mellitus, hypertension, hypothyroidism, melanoma presenting with 1 month history of fevers, chills, night sweats, and generalized weakness.  The patient states that she went on vacation to [facility] approximately 2 weeks prior to Christmas.  She went to the emergency department in [facility] with similar symptoms.  She was told that she had a viral infection and discharged in stable condition.  The patient subsequently came home back to [place] on the week of Christmas.  She went to see her primary care provider with a similar symptoms.  Once again, the patient was told that she has some type of viral infection.  However, the patient's generalized weakness and fevers persisted.  Today the patient was going to work and getting out of her car when she had a near syncopal episode secondary to dizziness.  She also had a temperature of 102.9 F.  As result, the patient drove herself to the emergency department for further evaluation.  The patient states that she has had intermittent epistaxis for the better part of a month that has been self-limited.  She denies any hemoptysis, hematemesis, hematochezia, melena.  She denies any headache, chest pain, nausea, vomiting, diarrhea, abdominal pain, dysuria, hematuria, neck pain.  She has had dyspnea on exertion for the better part of the month.  The patient denies starting any new medications.  She does not take any over-the-counter medications.  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  The patient was treated with chlorambucil, prednisone, and radiation  therapy.  She has been in remission since her teenage years.  She states that during her multiple visits to the emergency department and PCP, blood work was performed and she was never told of any concerns.  In the emergency department, the patient was febrile up to 102.6 F with tachycardia and soft blood pressure with systolic blood pressure in the 80s.  Oxygen saturation was 100% on room air.  The patient was started vancomycin and aztreonam and IV fluids.  Assessment/Plan: Sepsis -Source unclear -Certainly, the patient may be having an acute hematologic event such as leukemia -Continue vancomycin -Start meropenem--patient has tolerated cephalexin in the past -Lactic acid 1.8 -Procalcitonin 0.35 -Continue IV fluids -Personally reviewed chest x-ray--no consolidations, no edema -Urinalysis negative for pyuria -Follow blood cultures  Pancytopenia -The case was discussed with hematology, Dr. Allean Found did not feel the patient currently has acute leukemia, but will need further work-up of her hematologic abnormalities -As a result, the patient can be managed for the time being pending further work-up from himself regarding her hematology derangement -Influenza PCR -Viral respiratory panel -RMSF serology -Ehrlichia serology  AKI -Baseline creatinine 0.9-1.1 -Secondary to sepsis -Presented with creatinine 1.51  Diabetes mellitus type 2 -Hemoglobin A1c -Reduced dose Lantus -NovoLog sliding scale  Hypothyroidism -Continue levothyroxine  Essential hypertension -Holding Maxide, losartan secondary to hypotension  Hyperlipidemia -Continue statin    The patient is critically ill with multiple organ systems failure and requires high complexity decision making for assessment and support, frequent evaluation and titration of therapies, application of advanced monitoring technologies and extensive interpretation of  multiple databases.  Critical care time - 35 mins.      Past  Medical History:  Diagnosis Date  . HTN (hypertension)   . Hypothyroidism   . Leukemia [facility])    age 73   Past Surgical History:  Procedure Laterality Date  . COLONOSCOPY  07/27/2010   Procedure: COLONOSCOPY;  Surgeon: Dorothyann Peng, MD;  Location: [facility];  Service: Endoscopy;  Laterality: N/A;  . infertility surgery    . MOHS SURGERY     basal cell carcinoma  . REDUCTION MAMMAPLASTY Bilateral 2002   Social History:  reports that she has never smoked. She has never used smokeless tobacco. She reports current alcohol use. She reports that she does not use drugs.   Family History  Problem Relation Age of Onset  . Heart disease Mother   . Diabetes Father   . Breast cancer Sister 57  . Breast cancer Maternal Aunt   . Colon cancer Neg Hx   . Liver disease Neg Hx      Allergies  Allergen Reactions  . Doxycycline Anaphylaxis  . Penicillins Anaphylaxis    Has taken Keflex in past with no issues.  . Levofloxacin Nausea And Vomiting  . Sulfa Antibiotics      Prior to Admission medications   Medication Sig Start Date End Date Taking? Authorizing Provider  atorvastatin (LIPITOR) 20 MG tablet Take 1 tablet by mouth daily. 04/17/17  Yes [provider]  Insulin Glargine-Lixisenatide (SOLIQUA) 100-33 UNT-MCG/ML SOPN Inject 56 Units into the skin daily. 01/16/18  Yes [provider]  metFORMIN (GLUCOPHAGE) 1000 MG tablet Take 1 tablet by mouth 2 (two) times daily. 10/17/17  Yes [provider]  calcium citrate-vitamin D (CITRACAL+D) 315-200 MG-UNIT per tablet Take 1 tablet by mouth 2 (two) times daily.      [provider]  calcium-vitamin D (CALCIUM 500+D HIGH POTENCY) 500-400 MG-UNIT tablet Take 1 tablet by mouth 2 (two) times daily.    [provider]  furosemide (LASIX) 20 MG tablet Take 1 tablet by mouth daily as needed.    [provider]  levothyroxine (SYNTHROID, LEVOTHROID) 50 MCG tablet Take 50 mcg by mouth daily.       [provider]  losartan (COZAAR) 50 MG tablet Take 50 mg by mouth daily.      [provider]  triamterene-hydrochlorothiazide (MAXZIDE-25) 37.5-25 MG per tablet Take 1 tablet by mouth daily.      [provider]    Review of Systems:  Constitutional:  She endorses weight loss, night sweats, Fevers, chills, fatigue.  Head&Eyes: No headache.  No vision loss.  No eye pain or scotoma ENT:  No Difficulty swallowing,Tooth/dental problems,Sore throat,  No ear ache, post nasal drip,  Cardio-vascular:  No chest pain, Orthopnea, PND, swelling in lower extremities,  dizziness, palpitations  GI:  No  abdominal pain, nausea, vomiting, diarrhea, loss of appetite, hematochezia, melena, heartburn, indigestion, Resp:  No cough. No coughing up of blood .No wheezing.No chest wall deformity  Skin:  no rash or lesions.  GU:  no dysuria, change in color of urine, no urgency or frequency. No flank pain.  Musculoskeletal:  No joint pain or swelling. No decreased range of motion. No back pain.  Psych:  No change in mood or affect. No depression or anxiety. Neurologic: No headache, no dysesthesia, no focal weakness, no vision loss. No syncope  Physical Exam: Vitals:   01/18/18 1400 01/18/18 1406 01/18/18 1427 01/18/18 1430  BP: Marland Kitchen)  93/56 (!) 93/56 100/67 (!) 87/51  Pulse: 91 92 90 91  Resp: 16  18 (!) 22  Temp:  99.7 F (37.6 C) 100.2 F (37.9 C)   TempSrc:  Oral Oral   SpO2:  100% 100%   Weight:      Height:       General:  A&O x 3, NAD, nontoxic, pleasant/cooperative Head/Eye: No conjunctival hemorrhage, no icterus, [place]/AT, No nystagmus ENT:  No icterus,  No thrush, good dentition, no pharyngeal exudate Neck:  No masses, no lymphadenpathy, no bruits CV:  RRR, no rub, no gallop, no S3 Lung:  CTAB, good air movement, no wheeze, no rhonchi Abdomen: soft/NT, +BS, nondistended, no peritoneal signs Ext: No cyanosis, No rashes, No petechiae, No lymphangitis, No  edema Neuro: CNII-XII intact, strength 4/5 in bilateral upper and lower extremities, no dysmetria  Labs on Admission:  Basic Metabolic Panel: Recent Labs  Lab 01/18/18 1216  NA 133*  K 3.8  CL 109  CO2 17*  GLUCOSE 254*  BUN 37*  CREATININE 1.51*  CALCIUM 8.1*   Liver Function Tests: Recent Labs  Lab 01/18/18 1216  AST 30  ALT 285*  ALKPHOS 211*  BILITOT 1.2  PROT 5.4*  ALBUMIN 3.2*   No results for input(s): LIPASE, AMYLASE in the last 168 hours. No results for input(s): AMMONIA in the last 168 hours. CBC: Recent Labs  Lab 01/18/18 1216  WBC 0.7*  NEUTROABS 0.2*  HGB 4.9*  HCT 16.2*  MCV 100.0  PLT 29*   Coagulation Profile: Recent Labs  Lab 01/18/18 1216  INR 1.23   Cardiac Enzymes: Recent Labs  Lab 01/18/18 1216  TROPONINI <0.03   BNP: Invalid input(s): POCBNP CBG: No results for input(s): GLUCAP in the last 168 hours. Urine analysis:    Component Value Date/Time   COLORURINE YELLOW 01/18/2018 [place] 01/18/2018 1245   LABSPEC 1.011 01/18/2018 1245   PHURINE 6.0 01/18/2018 1245   GLUCOSEU NEGATIVE 01/18/2018 1245   HGBUR NEGATIVE 01/18/2018 [place] 01/18/2018 1245   KETONESUR NEGATIVE 01/18/2018 1245   PROTEINUR NEGATIVE 01/18/2018 1245   NITRITE NEGATIVE 01/18/2018 1245   LEUKOCYTESUR NEGATIVE 01/18/2018 1245   Sepsis Labs: @LABRCNTIP (procalcitonin:4,lacticidven:4) ) Recent Results (from the past 240 hour(s))  Blood Culture (routine x 2)     Status: None (Preliminary result)   Collection Time: 01/18/18 12:16 PM  Result Value Ref Range Status   Specimen Description LEFT ANTECUBITAL  Final   Special Requests   Final    BOTTLES DRAWN AEROBIC AND ANAEROBIC Blood Culture adequate volume Performed at [facility], [address] [place], [place] [address]    Culture PENDING  Incomplete   Report Status PENDING  Incomplete  Blood Culture (routine x 2)     Status: None (Preliminary result)    Collection Time: 01/18/18 12:21 PM  Result Value Ref Range Status   Specimen Description BLOOD LEFT ARM  Final   Special Requests   Final    BOTTLES DRAWN AEROBIC AND ANAEROBIC Blood Culture adequate volume Performed at [facility], [address], [place], [place] [address]    Culture PENDING  Incomplete   Report Status PENDING  Incomplete     Radiological Exams on Admission: Dg Chest Port 1 View  Result Date: 01/18/2018 CLINICAL DATA:  58 y/o  F; cough, shortness of breath, fever. EXAM: PORTABLE CHEST 1 VIEW COMPARISON:  None. FINDINGS: The heart size and mediastinal contours are within normal limits. Both lungs  are clear. The visualized skeletal structures are unremarkable. IMPRESSION: No active disease. Electronically Signed   By: Kristine Garbe M.D.   On: 01/18/2018 13:06        Time spent:60 minutes Code Status:   FULL Family Communication:  No Family at bedside Disposition Plan: expect 2-3 day hospitalization Consults called: heme/onc DVT Prophylaxis: SCDs  Orson Eva, DO  [facility] Pager [phone]  If 7PM-7AM, please contact night-coverage [email or web address] Password [email or web address] 01/18/2018, 3:00 PM

## 2018-01-18 NOTE — ED Notes (Signed)
Pharmacy called and is confirming aztreonam order with patient and EDP. Vancomycin and Flagyl to infuse while medication being verified.

## 2018-01-18 NOTE — Progress Notes (Signed)
Spoke with RN re PICC order.  States 2 PIV working well at this time. Notified best to wait on PICC unless necessary due to pending blood cultures and fever.  If PICC deemed necessary, to please notify [facility] for weekend coverage.  RN states she will discuss with Dr Tat.

## 2018-01-19 ENCOUNTER — Inpatient Hospital Stay (HOSPITAL_COMMUNITY): Payer: Commercial Managed Care - PPO

## 2018-01-19 DIAGNOSIS — R7401 Elevation of levels of liver transaminase levels: Secondary | ICD-10-CM

## 2018-01-19 DIAGNOSIS — R161 Splenomegaly, not elsewhere classified: Secondary | ICD-10-CM

## 2018-01-19 DIAGNOSIS — R740 Nonspecific elevation of levels of transaminase and lactic acid dehydrogenase [LDH]: Secondary | ICD-10-CM

## 2018-01-19 LAB — COMPREHENSIVE METABOLIC PANEL
ALT: 222 U/L — ABNORMAL HIGH (ref 0–44)
AST: 56 U/L — ABNORMAL HIGH (ref 15–41)
Albumin: 2.7 g/dL — ABNORMAL LOW (ref 3.5–5.0)
Alkaline Phosphatase: 235 U/L — ABNORMAL HIGH (ref 38–126)
Anion gap: 4 — ABNORMAL LOW (ref 5–15)
BUN: 32 mg/dL — ABNORMAL HIGH (ref 6–20)
CO2: 17 mmol/L — ABNORMAL LOW (ref 22–32)
Calcium: 7.3 mg/dL — ABNORMAL LOW (ref 8.9–10.3)
Chloride: 120 mmol/L — ABNORMAL HIGH (ref 98–111)
Creatinine, Ser: 1.21 mg/dL — ABNORMAL HIGH (ref 0.44–1.00)
GFR calc Af Amer: 57 mL/min — ABNORMAL LOW (ref >60)
GFR calc non Af Amer: 49 mL/min — ABNORMAL LOW (ref >60)
GLUCOSE: 129 mg/dL — AB (ref 70–99)
Potassium: 4 mmol/L (ref 3.5–5.1)
Sodium: 141 mmol/L (ref 135–145)
TOTAL PROTEIN: 4.5 g/dL — AB (ref 6.5–8.1)
Total Bilirubin: 2.2 mg/dL — ABNORMAL HIGH (ref 0.3–1.2)

## 2018-01-19 LAB — CBC
HCT: 23.7 % — ABNORMAL LOW (ref 36.0–46.0)
Hemoglobin: 7.5 g/dL — ABNORMAL LOW (ref 12.0–15.0)
MCH: 30.4 pg (ref 26.0–34.0)
MCHC: 31.6 g/dL (ref 30.0–36.0)
MCV: 96 fL (ref 80.0–100.0)
Platelets: 25 10*3/uL — CL (ref 150–400)
RBC: 2.47 MIL/uL — ABNORMAL LOW (ref 3.87–5.11)
RDW: 18.9 % — ABNORMAL HIGH (ref 11.5–15.5)
WBC: 0.6 10*3/uL — AB (ref 4.0–10.5)
nRBC: 0 % (ref 0.0–0.2)

## 2018-01-19 LAB — HEMOGLOBIN A1C
Hgb A1c MFr Bld: 5.7 % — ABNORMAL HIGH (ref 4.8–5.6)
Mean Plasma Glucose: 116.89 mg/dL

## 2018-01-19 LAB — BILIRUBIN, FRACTIONATED(TOT/DIR/INDIR)
BILIRUBIN TOTAL: 1.8 mg/dL — AB (ref 0.3–1.2)
Bilirubin, Direct: 0.8 mg/dL — ABNORMAL HIGH (ref 0.0–0.2)
Indirect Bilirubin: 1 mg/dL — ABNORMAL HIGH (ref 0.3–0.9)

## 2018-01-19 LAB — LACTATE DEHYDROGENASE: LDH: 283 U/L — ABNORMAL HIGH (ref 98–192)

## 2018-01-19 LAB — RESPIRATORY PANEL BY PCR
Adenovirus: NOT DETECTED
Bordetella pertussis: NOT DETECTED
Chlamydophila pneumoniae: NOT DETECTED
Coronavirus 229E: NOT DETECTED
Coronavirus HKU1: NOT DETECTED
Coronavirus NL63: NOT DETECTED
Coronavirus OC43: NOT DETECTED
INFLUENZA A-RVPPCR: NOT DETECTED
Influenza B: NOT DETECTED
Metapneumovirus: NOT DETECTED
Mycoplasma pneumoniae: NOT DETECTED
Parainfluenza Virus 1: NOT DETECTED
Parainfluenza Virus 2: NOT DETECTED
Parainfluenza Virus 3: NOT DETECTED
Parainfluenza Virus 4: NOT DETECTED
Respiratory Syncytial Virus: NOT DETECTED
Rhinovirus / Enterovirus: DETECTED — AB

## 2018-01-19 LAB — GLUCOSE, CAPILLARY
GLUCOSE-CAPILLARY: 104 mg/dL — AB (ref 70–99)
Glucose-Capillary: 139 mg/dL — ABNORMAL HIGH (ref 70–99)
Glucose-Capillary: 115 mg/dL — ABNORMAL HIGH (ref 70–99)
Glucose-Capillary: 93 mg/dL (ref 70–99)
Glucose-Capillary: 129 mg/dL — ABNORMAL HIGH (ref 70–99)
Glucose-Capillary: 190 mg/dL — ABNORMAL HIGH (ref 70–99)

## 2018-01-19 LAB — URINE CULTURE

## 2018-01-19 LAB — DIFFERENTIAL
Basophils Absolute: 0 10*3/uL (ref 0.0–0.1)
Basophils Relative: 0 %
Eosinophils Absolute: 0 10*3/uL (ref 0.0–0.5)
Eosinophils Relative: 0 %
Lymphocytes Relative: 52 %
Lymphs Abs: 0.3 10*3/uL — ABNORMAL LOW (ref 0.7–4.0)
MONOS PCT: 4 %
Monocytes Absolute: 0 10*3/uL — ABNORMAL LOW (ref 0.1–1.0)
Neutro Abs: 0.2 10*3/uL — ABNORMAL LOW (ref 1.7–7.7)
Neutrophils Relative %: 43 %

## 2018-01-19 LAB — PROCALCITONIN: Procalcitonin: 0.68 ng/mL

## 2018-01-19 MED ORDER — VITAMIN B-12 100 MCG PO TABS
500.0000 ug | ORAL_TABLET | Freq: Every day | ORAL | Status: DC
  Administered 2018-01-19 – 2018-01-24 (×6): 500 ug via ORAL
  Filled 2018-01-19: qty 1
  Filled 2018-01-19 (×2): qty 5
  Filled 2018-01-19 (×3): qty 1

## 2018-01-19 MED ORDER — SODIUM CHLORIDE 0.9 % IV SOLN
100.0000 mg | Freq: Two times a day (BID) | INTRAVENOUS | Status: DC
  Administered 2018-01-19 – 2018-01-22 (×6): 100 mg via INTRAVENOUS
  Filled 2018-01-19 (×8): qty 100

## 2018-01-19 MED ORDER — SODIUM CHLORIDE 0.9 % IV SOLN
1.0000 g | Freq: Two times a day (BID) | INTRAVENOUS | Status: DC
  Administered 2018-01-19 – 2018-01-22 (×7): 1 g via INTRAVENOUS
  Filled 2018-01-19 (×9): qty 1

## 2018-01-19 NOTE — Progress Notes (Signed)
CRITICAL VALUE ALERT  Critical Value:  platlets 25  Date & Time Notied:  01/19/18 0608  Provider Notified: Dr. Olevia Bowens  Orders Received/Actions taken: none

## 2018-01-19 NOTE — Progress Notes (Signed)
PROGRESS NOTE  Kristin Good RXV:[id number] DOB: 1961-01-06 DOA: 01/18/2018 PCP: Marinda Elk, MD  Brief History:   58 y.o. female with medical history of some type of hematologic malignancy, diabetes mellitus, hypertension, hypothyroidism, melanoma presenting with 1 month history of fevers, chills, night sweats, and generalized weakness.  The patient states that she went on vacation to [facility] approximately 2 weeks prior to Christmas.  She went to the emergency department in [facility] with similar symptoms.  She was told that she had a viral infection and discharged in stable condition.  The patient subsequently came home back to [place] on the week of Christmas.  She went to see her primary care provider with a similar symptoms.  Once again, the patient was told that she has some type of viral infection.  However, the patient's generalized weakness and fevers persisted.  Today the patient was going to work and getting out of her car when she had a near syncopal episode secondary to dizziness.  She also had a temperature of 102.9 F.  As result, the patient drove herself to the emergency department for further evaluation.  The patient states that she has had intermittent epistaxis for the better part of a month that has been self-limited.  She denies any hemoptysis, hematemesis, hematochezia, melena.  She denies any headache, chest pain, nausea, vomiting, diarrhea, abdominal pain, dysuria, hematuria, neck pain.  She has had dyspnea on exertion for the better part of the month.  The patient denies starting any new medications.  She does not take any over-the-counter medications.  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  The patient was treated with chlorambucil, prednisone, and radiation therapy.  She has been in remission since her teenage years.  She states that during her  multiple visits to the emergency department and PCP, blood work was performed and she was never told of any concerns  In the emergency department, the patient was febrile up to 102.6 F with tachycardia and soft blood pressure with systolic blood pressure in the 80s.  Oxygen saturation was 100% on room air.  The patient was started vancomycin and aztreonam and IV fluids.  Hematology/oncology was consulted to assist with management   Assessment/Plan: Sepsis -Source unclear, work-up in progress -Certainly, the patient may be having an acute hematologic event such as leukemia -Continue vancomycin, aztreonam, metronidazole -Lactic acid 1.8 -Procalcitonin 0.35 -Continue IV fluids -Personally reviewed chest x-ray--no consolidations, no edema -Urinalysis negative for pyuria -Follow blood cultures  Pancytopenia/fevers -The case was discussed with hematology, Dr. Allean Found did not feel the patient currently has acute leukemia, but will need further work-up of her hematologic abnormalities -As a result, the patient can be managed for the time being pending further work-up from himself regarding her hematology derangement -Influenza PCR--negative -Viral respiratory panel -RMSF serology -Ehrlichia serology -EBV DNA -CMV DNA -HIV RNA -Hepatitis B surface antigen -Hep C antibody -LDH 283  AKI -Baseline creatinine 0.9-1.1 -Secondary to sepsis -Presented with creatinine 1.51 -Abdominal ultrasound  Transaminasemia -Abdominal ultrasound -Viral serologies and PCR as discussed above -Suspect this is likely due to septic shock -fractionate bili -haptoglobin  Diabetes mellitus type 2 -Hemoglobin A1c -Reduced dose Lantus -NovoLog sliding scale  Hypothyroidism -Continue levothyroxine  Essential hypertension -Holding Maxide, losartan secondary to hypotension  Hyperlipidemia -Continue statin   Disposition Plan:  Remain in ICU Family Communication: No  Family at  bedside  Consultants:  Heme/onc  Code Status:  FULL   DVT Prophylaxis:  SCDs   Procedures: As Listed in Progress Note Above  Antibiotics: vanc 1/5>>> aztreo 1/5>>> Flagyl 1/5>>>       Subjective: Patient denies fevers, chills, headache, chest pain, dyspnea, nausea, vomiting, diarrhea, abdominal pain, dysuria, hematuria, hematochezia, and melena.   Objective: Vitals:   01/19/18 0500 01/19/18 0530 01/19/18 0600 01/19/18 0700  BP: (!) 91/57 (!) 94/53 99/64 105/66  Pulse: 70 71 69 67  Resp: 20 18 16 15   Temp:      TempSrc:      SpO2: 99% 96% 98% 99%  Weight:      Height:        Intake/Output Summary (Last 24 hours) at 01/19/2018 0744 Last data filed at 01/19/2018 0200 Gross per 24 hour  Intake 4515.7 ml  Output 500 ml  Net 4015.7 ml   Weight change:  Exam:   General:  Pt is alert, follows commands appropriately, not in acute distress  HEENT: No icterus, No thrush, No neck mass, [place]/AT  Cardiovascular: RRR, S1/S2, no rubs, no gallops  Respiratory: CTA bilaterally, no wheezing, no crackles, no rhonchi  Abdomen: Soft/+BS, non tender, non distended, no guarding  Extremities: trace LE edema, No lymphangitis, No petechiae, No rashes, no synovitis   Data Reviewed: I have personally reviewed following labs and imaging studies Basic Metabolic Panel: Recent Labs  Lab 01/18/18 1216 01/19/18 0420  NA 133* 141  K 3.8 4.0  CL 109 120*  CO2 17* 17*  GLUCOSE 254* 129*  BUN 37* 32*  CREATININE 1.51* 1.21*  CALCIUM 8.1* 7.3*   Liver Function Tests: Recent Labs  Lab 01/18/18 1216 01/19/18 0420  AST 30 56*  ALT 285* 222*  ALKPHOS 211* 235*  BILITOT 1.2 2.2*  PROT 5.4* 4.5*  ALBUMIN 3.2* 2.7*   No results for input(s): LIPASE, AMYLASE in the last 168 hours. No results for input(s): AMMONIA in the last 168 hours. Coagulation Profile: Recent Labs  Lab 01/18/18 1216  INR 1.23   CBC: Recent Labs  Lab 01/18/18 1216 01/19/18 0420  WBC 0.7* 0.6*   NEUTROABS 0.2*  --   HGB 4.9* 7.5*  HCT 16.2* 23.7*  MCV 100.0 96.0  PLT 29* 25*   Cardiac Enzymes: Recent Labs  Lab 01/18/18 1216  TROPONINI <0.03   BNP: Invalid input(s): POCBNP CBG: Recent Labs  Lab 01/18/18 1649 01/18/18 2129 01/19/18 0245 01/19/18 0405  GLUCAP 145* 147* 139* 115*   HbA1C: No results for input(s): HGBA1C in the last 72 hours. Urine analysis:    Component Value Date/Time   COLORURINE YELLOW 01/18/2018 [place] 01/18/2018 1245   LABSPEC 1.011 01/18/2018 1245   PHURINE 6.0 01/18/2018 1245   GLUCOSEU NEGATIVE 01/18/2018 1245   HGBUR NEGATIVE 01/18/2018 [place] 01/18/2018 1245   KETONESUR NEGATIVE 01/18/2018 1245   PROTEINUR NEGATIVE 01/18/2018 1245   NITRITE NEGATIVE 01/18/2018 1245   LEUKOCYTESUR NEGATIVE 01/18/2018 1245   Sepsis Labs: @LABRCNTIP (procalcitonin:4,lacticidven:4) ) Recent Results (from the past 240 hour(s))  Blood Culture (routine x 2)     Status: None (Preliminary result)   Collection Time: 01/18/18 12:16 PM  Result Value Ref Range Status   Specimen Description LEFT ANTECUBITAL  Final   Special Requests   Final    BOTTLES DRAWN AEROBIC AND ANAEROBIC Blood Culture adequate volume Performed at [facility], [address],  [place], [place] [address]    Culture PENDING  Incomplete   Report Status PENDING  Incomplete  Blood Culture (routine x 2)     Status: None (Preliminary result)   Collection Time: 01/18/18 12:21 PM  Result Value Ref Range Status   Specimen Description BLOOD LEFT ARM  Final   Special Requests   Final    BOTTLES DRAWN AEROBIC AND ANAEROBIC Blood Culture adequate volume Performed at [facility], [address] [place], [place] [address]    Culture PENDING  Incomplete   Report Status PENDING  Incomplete  MRSA PCR Screening     Status: Abnormal   Collection Time: 01/18/18  4:02 PM  Result Value Ref Range Status   MRSA by PCR POSITIVE (A) NEGATIVE Final    Comment:         The GeneXpert MRSA Assay (FDA approved for NASAL specimens only), is one component of a comprehensive MRSA colonization surveillance program. It is not intended to diagnose MRSA infection nor to guide or monitor treatment for MRSA infections. RESULT CALLED TO, READ BACK BY AND VERIFIED WITH: EVANS,H. AT 2012 ON 01/18/2017 BY EVA Performed at [facility], [address], [place], [place] [address]      Scheduled Meds: . Chlorhexidine Gluconate Cloth  6 each Topical Q0600  . insulin aspart  0-15 Units Subcutaneous TID WC  . insulin aspart  0-5 Units Subcutaneous QHS  . insulin glargine  20 Units Subcutaneous QHS  . levothyroxine  50 mcg Oral Q0600  . mupirocin ointment  1 application Nasal BID  . vitamin B-12  500 mcg Oral Daily   Continuous Infusions: . 0.9 % NaCl with KCl 20 mEq / L 100 mL/hr at 01/19/18 0406  . aztreonam 2 g (01/19/18 0459)  . metronidazole 500 mg (01/19/18 0410)  . vancomycin      Procedures/Studies: Dg Chest Port 1 View  Result Date: 01/18/2018 CLINICAL DATA:  58 y/o  F; cough, shortness of breath, fever. EXAM: PORTABLE CHEST 1 VIEW COMPARISON:  None. FINDINGS: The heart size and mediastinal contours are within normal limits. Both lungs are clear. The visualized skeletal structures are unremarkable. IMPRESSION: No active disease. Electronically Signed   By: Kristine Garbe M.D.   On: 01/18/2018 13:06    Orson Eva, DO  [facility] Pager [phone]  If 7PM-7AM, please contact night-coverage [email or web address] Password [email or web address] 01/19/2018, 7:44 AM   LOS: 1 day

## 2018-01-19 NOTE — Progress Notes (Signed)
Initial Nutrition Assessment  DOCUMENTATION CODES:   Severe malnutrition in context of acute illness/injury  INTERVENTION:  Regular diet   Snacks between meals as desired  Nutrition services to obtain food preferences each meal  Multivitamin daily  NUTRITION DIAGNOSIS:   Severe Malnutrition related to acute illness(sepsis-w/AKI, B-12 defciency, severe pancocytopenia) as evidenced by energy intake < or equal to 50% for > or equal to 5 days, mild fat depletion, mild muscle depletion, percent weight loss- 9% in < 1 month.   GOAL:   Patient will meet greater than or equal to 90% of their needs MONITOR:   PO intake, Weight trends, Labs REASON FOR ASSESSMENT:   Malnutrition Screening Tool    ASSESSMENT: Patient is a 58 yo female with a history of leukemia, Hypothyroidism. She  presents with generalized weakness, fever-septic with AKI. Oncology consulted -(severe pancocytopenia). Vitamin B-12 found to be low-94. [place] of abdomen- marked splenomegaly. Patient has mild-pitting edema- bilateral upper and lower extremities.  Meal intake poor at 25-50% of regular diet. Reports feeling a little better today but appetite still diminished. Patient says she has been eating approximately 1/3 of her usual intake the past 2 weeks.  She is able to feed herself. Encouraged her to work with nutrition services to customize her meals for maximum intake. She does have snacks on bedside table snickers and diet Dr Malachi Bonds. Patient says family also brought in a meal for her yesterday. Regular diet order allows for flexibility of food choices which is helpful given her poor intake.  Usual body weight self-reported as 140 lb. Currently wt at 128 lb-reflects an acute loss of 9% < 1 month which is clinically significant.      Medications reviewed and include:  SSI, Lantus, B-12, Vibramycin, Merrem, Vancomycin.   Labs: Albumin 2.4 (low), Hemoglobin 7.0 (low) BMP Latest Ref Rng & Units 01/20/2018 01/19/2018 01/18/2018   Glucose 70 - 99 mg/dL 108(H) 129(H) 254(H)  BUN 6 - 20 mg/dL 22(H) 32(H) 37(H)  Creatinine 0.44 - 1.00 mg/dL 0.92 1.21(H) 1.51(H)  Sodium 135 - 145 mmol/L 141 141 133(L)  Potassium 3.5 - 5.1 mmol/L 3.9 4.0 3.8  Chloride 98 - 111 mmol/L 122(H) 120(H) 109  CO2 22 - 32 mmol/L 16(L) 17(L) 17(L)  Calcium 8.9 - 10.3 mg/dL 7.4(L) 7.3(L) 8.1(L)     NUTRITION - FOCUSED PHYSICAL EXAM:    Most Recent Value  Orbital Region  Mild depletion  Buccal Region  Mild depletion  Temple Region  Moderate depletion  Clavicle Bone Region  Mild depletion  Clavicle and Acromion Bone Region  Mild depletion  Dorsal Hand  Unable to assess [edema]  Hair  Reviewed  Skin  Reviewed       Diet Order:   Diet Order            Diet regular Room service appropriate? Yes; Fluid consistency: Thin  Diet effective now             EDUCATION NEEDS:    Skin:  Skin Assessment: Reviewed RN Assessment  Last BM:  1/4  Height:   Ht Readings from Last 1 Encounters:  01/18/18 5\' 3"  (1.6 m)    Weight:   Wt Readings from Last 1 Encounters:  01/20/18 57.5 kg    Ideal Body Weight:  52 kg  BMI:  Body mass index is 22.46 kg/m.  Estimated Nutritional Needs:   Kcal:  2426-8341 (28-32 kcal/kg/bw)  Protein:  70-75 gr (1.2-1.3 gr/kg/bw)  Fluid:  1.6-1.8 liters daily  Colman Cater MS,RD,CSG,LDN Office: [phone] Pager: [phone]

## 2018-01-19 NOTE — Progress Notes (Signed)
Per patient's RN, MD no longer needs a PICC  placed on this patient. Please record if patient's condition changes.

## 2018-01-19 NOTE — Consult Note (Signed)
[facility] Consultation Oncology  Name: Kristin Good      MRN: [id number]    Location: IC05/IC05-01  Date: 01/19/2018 Time:1:32 PM   REFERRING PHYSICIAN: Dr. Carles Collet  REASON FOR CONSULT: Pancytopenia   DIAGNOSIS: Pancytopenia under work-up, likely due to vitamin B12 deficiency   HISTORY OF PRESENT ILLNESS: Kristin Good is a 58 year old very pleasant white female seen in consultation today for further work-up and management of severe pancytopenia.  She has been having severe weakness which has progressively worsened since October/November 2019.  She was evaluated by her PMD and also in the ER at [facility].  She recollects that her blood work was drawn at both sites and was told to have "viral infection".  As she was progressively getting worse, she came to the emergency room and was found to have severely low blood counts with hemoglobin of 4.9, MCV of 100, white count of 0.7 and platelet count of 29.  She was also febrile with a temperature of 102.6 with chills.  She reported 20 pound weight loss since October.  She has lost appetite.  She works as a Emergency planning/management officer.  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  She also reportedly received radiation therapy to the neck area.  She was treated by Dr. Jeralyn Ruths at [facility].  She is currently on IV antibiotics.  Blood cultures are pending at this time.  Chest x-ray did not reveal any source of infection.  She has been afebrile since yesterday evening.  MRSA swab was positive from the nose.  She was started on broad-spectrum antibiotics.  She is feeling much better after transfusions.  Her hemoglobin improved to 7.5.  Platelet count was 25 and white count of 0.6.  Her B12 was found to be low at 94.  Sister has breast cancer diagnosed in 2013.  Mother had ovarian cancer.  Paternal uncle had prostate  cancer. She works as a Emergency planning/management officer and lives at home with her husband.  She does not have children.  PAST MEDICAL HISTORY:   Past Medical History:  Diagnosis Date  . HTN (hypertension)   . Hypothyroidism   . Leukemia [facility])    age 66    ALLERGIES: Allergies  Allergen Reactions  . Doxycycline Anaphylaxis  . Penicillins Anaphylaxis    Has taken Keflex in past with no issues.  . Levofloxacin Nausea And Vomiting  . Sulfa Antibiotics       MEDICATIONS: I have reviewed the patient's current medications.     PAST SURGICAL HISTORY Past Surgical History:  Procedure Laterality Date  . COLONOSCOPY  07/27/2010   Procedure: COLONOSCOPY;  Surgeon: Dorothyann Peng, MD;  Location: [facility] ENDO [facility];  Service: Endoscopy;  Laterality: N/A;  . infertility surgery    . MOHS SURGERY     basal cell carcinoma  . REDUCTION MAMMAPLASTY Bilateral 2002    FAMILY HISTORY: Family History  Problem Relation Age of Onset  . Heart disease Mother   . Diabetes Father   . Breast cancer Sister 72  . Breast cancer Maternal Aunt   . Colon cancer Neg Hx   . Liver disease Neg Hx     SOCIAL HISTORY:  reports that she has never smoked. She has never used smokeless tobacco. She reports current alcohol use. She reports that she does not use drugs.  PERFORMANCE STATUS: The  patient's performance status is 2 - Symptomatic, <50% confined to bed  PHYSICAL EXAM: Most Recent Vital Signs: Blood pressure (!) 144/74, pulse 93, temperature 98.3 F (36.8 C), resp. rate (!) 25, height 5\' 3"  (1.6 m), weight 126 lb 12.2 oz (57.5 kg), SpO2 100 %. BP (!) 144/74   Pulse 93   Temp 98.3 F (36.8 C)   Resp (!) 25   Ht 5\' 3"  (1.6 m)   Wt 126 lb 12.2 oz (57.5 kg)   SpO2 100%   BMI 22.46 kg/m  General appearance: alert, cooperative and appears stated age Neck: no adenopathy, supple, symmetrical, trachea midline and thyroid not enlarged, symmetric, no tenderness/mass/nodules Lungs: clear to auscultation  bilaterally Heart: regularly irregular rhythm Abdomen: soft, non-tender; bowel sounds normal; no masses,  no organomegaly Extremities: extremities normal, atraumatic, no cyanosis or edema Skin: Skin color, texture, turgor normal. No rashes or lesions Lymph nodes: Cervical, supraclavicular, and axillary nodes normal. Neurologic: Grossly normal  LABORATORY DATA:  Results for orders placed or performed during the hospital encounter of 01/18/18 (from the past 48 hour(s))  Type and screen     Status: None   Collection Time: 01/18/18 12:00 PM  Result Value Ref Range   ABO/RH(D) B POS    Antibody Screen NEG    Sample Expiration 01/21/2018    Unit Number [id number]    Blood Component Type RED CELLS,LR    Unit division 00    Status of Unit ISSUED,FINAL    Transfusion Status OK TO TRANSFUSE    Crossmatch Result      Compatible Performed at [facility], [address] [place], [place] [address]    Unit Number [id number]    Blood Component Type RBC LR PHER2    Unit division 00    Status of Unit ISSUED,FINAL    Transfusion Status OK TO TRANSFUSE    Crossmatch Result Compatible    Unit Number [id number]    Blood Component Type RBC LR PHER1    Unit division 00    Status of Unit ISSUED,FINAL    Transfusion Status OK TO TRANSFUSE    Crossmatch Result Compatible   ABO/Rh     Status: None   Collection Time: 01/18/18 12:00 PM  Result Value Ref Range   ABO/RH(D)      B POS Performed at [facility], [address], [place], [place] [address]   Comprehensive metabolic panel     Status: Abnormal   Collection Time: 01/18/18 12:16 PM  Result Value Ref Range   Sodium 133 (L) 135 - 145 mmol/L   Potassium 3.8 3.5 - 5.1 mmol/L   Chloride 109 98 - 111 mmol/L   CO2 17 (L) 22 - 32 mmol/L   Glucose, Bld 254 (H) 70 - 99 mg/dL   BUN 37 (H) 6 - 20 mg/dL   Creatinine, Ser 1.51 (H) 0.44 - 1.00 mg/dL   Calcium 8.1 (L) 8.9 - 10.3 mg/dL   Total Protein 5.4 (L) 6.5 - 8.1 g/dL   Albumin 3.2 (L)  3.5 - 5.0 g/dL   AST 30 15 - 41 U/L   ALT 285 (H) 0 - 44 U/L   Alkaline Phosphatase 211 (H) 38 - 126 U/L   Total Bilirubin 1.2 0.3 - 1.2 mg/dL   GFR calc non Af Amer 37 (L) >60 mL/min   GFR calc Af Amer 43 (L) >60 mL/min   Anion gap 7 5 - 15    Comment: Performed at [facility], [address],  [place], [place] [address]  CBC WITH DIFFERENTIAL     Status: Abnormal   Collection Time: 01/18/18 12:16 PM  Result Value Ref Range   WBC 0.7 (LL) 4.0 - 10.5 K/uL    Comment: REPEATED TO VERIFY WHITE COUNT CONFIRMED ON SMEAR THIS CRITICAL RESULT HAS VERIFIED AND BEEN CALLED TO WHITE M. BY LATISHA HENDERSON ON 01 05 2020 AT 28, AND HAS BEEN READ BACK. CRITICAL RESULT VERIFIED    RBC 1.62 (L) 3.87 - 5.11 MIL/uL   Hemoglobin 4.9 (LL) 12.0 - 15.0 g/dL    Comment: REPEATED TO VERIFY THIS CRITICAL RESULT HAS VERIFIED AND BEEN CALLED TO WHITE M. BY LATISHA HENDERSON ON 01 05 2020 AT 1240, AND HAS BEEN READ BACK. CRITICAL RESULT VERIFIED    HCT 16.2 (L) 36.0 - 46.0 %   MCV 100.0 80.0 - 100.0 fL   MCH 30.2 26.0 - 34.0 pg   MCHC 30.2 30.0 - 36.0 g/dL   RDW 18.5 (H) 11.5 - 15.5 %   Platelets 29 (LL) 150 - 400 K/uL    Comment: REPEATED TO VERIFY PLATELET COUNT CONFIRMED BY SMEAR SPECIMEN CHECKED FOR CLOTS THIS CRITICAL RESULT HAS VERIFIED AND BEEN CALLED TO WHITE M. BY LATISHA HENDERSON ON 01 05 2020 AT 1240, AND HAS BEEN READ BACK. CRITICAL RESULT VERIFIED    nRBC 0.0 0.0 - 0.2 %   Neutrophils Relative % 22 %   Neutro Abs 0.2 (L) 1.7 - 7.7 K/uL   Lymphocytes Relative 73 %   Lymphs Abs 0.5 (L) 0.7 - 4.0 K/uL   Monocytes Relative 4 %   Monocytes Absolute 0.0 (L) 0.1 - 1.0 K/uL   Eosinophils Relative 0 %   Eosinophils Absolute 0.0 0.0 - 0.5 K/uL   Basophils Relative 0 %   Basophils Absolute 0.0 0.0 - 0.1 K/uL   WBC Morphology ABSOLUTE LYMPHOCYTOSIS     Comment: WHITE CELLS CONFIRMED BY SMEAR   Immature Granulocytes 1 %   Abs Immature Granulocytes 0.01 0.00 - 0.07 K/uL   Reactive, Benign  Lymphocytes PRESENT    Tear Drop Cells PRESENT     Comment: Performed at [facility], [address], [place], [place] [address]  Blood Culture (routine x 2)     Status: None (Preliminary result)   Collection Time: 01/18/18 12:16 PM  Result Value Ref Range   Specimen Description LEFT ANTECUBITAL    Special Requests      BOTTLES DRAWN AEROBIC AND ANAEROBIC Blood Culture adequate volume   Culture      NO GROWTH < 24 HOURS Performed at [facility], [address] [place], [place] [address]    Report Status PENDING   Reticulocytes     Status: Abnormal   Collection Time: 01/18/18 12:16 PM  Result Value Ref Range   Retic Ct Pct 4.6 (H) 0.4 - 3.1 %   RBC. 1.62 (L) 3.87 - 5.11 MIL/uL   Retic Count, Absolute 74.4 19.0 - 186.0 K/uL   Immature Retic Fract 16.3 (H) 2.3 - 15.9 %    Comment: Performed at [facility], [address] [place], [place] [address]  Procalcitonin     Status: None   Collection Time: 01/18/18 12:16 PM  Result Value Ref Range   Procalcitonin 0.35 ng/mL    Comment:        Interpretation: PCT (Procalcitonin) <= 0.5 ng/mL: Systemic infection (sepsis) is not likely. Local bacterial infection is possible. (NOTE)       Sepsis PCT Algorithm  Lower Respiratory Tract                                      Infection PCT Algorithm    ----------------------------     ----------------------------         PCT < 0.25 ng/mL                PCT < 0.10 ng/mL         Strongly encourage             Strongly discourage   discontinuation of antibiotics    initiation of antibiotics    ----------------------------     -----------------------------       PCT 0.25 - 0.50 ng/mL            PCT 0.10 - 0.25 ng/mL               OR       >80% decrease in PCT            Discourage initiation of                                            antibiotics      Encourage discontinuation           of antibiotics    ----------------------------     -----------------------------         PCT >=  0.50 ng/mL              PCT 0.26 - 0.50 ng/mL               AND        <80% decrease in PCT             Encourage initiation of                                             antibiotics       Encourage continuation           of antibiotics    ----------------------------     -----------------------------        PCT >= 0.50 ng/mL                  PCT > 0.50 ng/mL               AND         increase in PCT                  Strongly encourage                                      initiation of antibiotics    Strongly encourage escalation           of antibiotics                                     -----------------------------  PCT <= 0.25 ng/mL                                                 OR                                        > 80% decrease in PCT                                     Discontinue / Do not initiate                                             antibiotics Performed at [facility], [address], [place], [place] [address]   Troponin I - ONCE - STAT     Status: None   Collection Time: 01/18/18 12:16 PM  Result Value Ref Range   Troponin I <0.03 <0.03 ng/mL    Comment: Performed at [facility], [address], [place], [place] [address]  Protime-INR     Status: Abnormal   Collection Time: 01/18/18 12:16 PM  Result Value Ref Range   Prothrombin Time 15.4 (H) 11.4 - 15.2 seconds   INR 1.23     Comment: Performed at [facility], [address], [place], [place] [address]  PTT     Status: None   Collection Time: 01/18/18 12:16 PM  Result Value Ref Range   aPTT 32 24 - 36 seconds    Comment: Performed at [facility], [address] [place], [place] [address]  Fibrinogen     Status: None   Collection Time: 01/18/18 12:16 PM  Result Value Ref Range   Fibrinogen 302 210 - 475 mg/dL    Comment: Performed at [facility], [address], [place], [place] [address]  Prepare RBC     Status: None   Collection Time: 01/18/18 12:16 PM   Result Value Ref Range   Order Confirmation      ORDER PROCESSED BY BLOOD BANK Performed at [facility], [address] [place], [place] [address]   Lactate dehydrogenase     Status: Abnormal   Collection Time: 01/18/18 12:16 PM  Result Value Ref Range   LDH 255 (H) 98 - 192 U/L    Comment: Performed at [facility], [address] [place], [place] [address]  Prepare RBC     Status: None   Collection Time: 01/18/18 12:16 PM  Result Value Ref Range   Order Confirmation      ORDER PROCESSED BY BLOOD BANK Performed at [facility], [address] [place], [place] [address]   Vitamin B12     Status: Abnormal   Collection Time: 01/18/18 12:17 PM  Result Value Ref Range   Vitamin B-12 94 (L) 180 - 914 pg/mL    Comment: (NOTE) This assay is not validated for testing neonatal or myeloproliferative syndrome specimens for Vitamin B12 levels. Performed at [facility], [address], [place], [place] [address]   Folate     Status: None   Collection Time: 01/18/18 12:17 PM  Result Value  Ref Range   Folate 18.9 >5.9 ng/mL    Comment: Performed at [facility], [address], [place], [place] [address]  Iron and TIBC     Status: Abnormal   Collection Time: 01/18/18 12:17 PM  Result Value Ref Range   Iron 15 (L) 28 - 170 ug/dL   TIBC 283 250 - 450 ug/dL   Saturation Ratios 5 (L) 10.4 - 31.8 %   UIBC 268 ug/dL    Comment: Performed at [facility], [address] [place], [place] [address]  Ferritin     Status: Abnormal   Collection Time: 01/18/18 12:17 PM  Result Value Ref Range   Ferritin 842 (H) 11 - 307 ng/mL    Comment: Performed at [facility], [address] [place], [place] [address]  Brain natriuretic peptide     Status: None   Collection Time: 01/18/18 12:18 PM  Result Value Ref Range   B Natriuretic Peptide 60.0 0.0 - 100.0 pg/mL    Comment: Performed at [facility], [address] [place], [place] [address]  Blood Culture (routine x 2)     Status: None (Preliminary  result)   Collection Time: 01/18/18 12:21 PM  Result Value Ref Range   Specimen Description BLOOD LEFT ARM    Special Requests      BOTTLES DRAWN AEROBIC AND ANAEROBIC Blood Culture adequate volume   Culture      NO GROWTH < 24 HOURS Performed at [facility], [address], [place], [place] [address]    Report Status PENDING   I-Stat CG4 Lactic Acid, ED     Status: Abnormal   Collection Time: 01/18/18 12:27 PM  Result Value Ref Range   Lactic Acid, Venous 1.98 (H) 0.5 - 1.9 mmol/L  Urinalysis, Routine w reflex microscopic     Status: None   Collection Time: 01/18/18 12:45 PM  Result Value Ref Range   Color, Urine YELLOW YELLOW   APPearance CLEAR CLEAR   Specific Gravity, Urine 1.011 1.005 - 1.030   pH 6.0 5.0 - 8.0   Glucose, UA NEGATIVE NEGATIVE mg/dL   Hgb urine dipstick NEGATIVE NEGATIVE   Bilirubin Urine NEGATIVE NEGATIVE   Ketones, ur NEGATIVE NEGATIVE mg/dL   Protein, ur NEGATIVE NEGATIVE mg/dL   Nitrite NEGATIVE NEGATIVE   Leukocytes, UA NEGATIVE NEGATIVE    Comment: Performed at [facility], [address], [place], [place] [address]  Urine culture     Status: Abnormal   Collection Time: 01/18/18 12:45 PM  Result Value Ref Range   Specimen Description      URINE, CLEAN CATCH Performed at [facility], [address], [place], [place] [address]    Special Requests      NONE Performed at [facility], [address], [place], [place] [address]    Culture MULTIPLE SPECIES PRESENT, SUGGEST RECOLLECTION (A)    Report Status 01/19/2018 FINAL   I-Stat CG4 Lactic Acid, ED     Status: None   Collection Time: 01/18/18  3:46 PM  Result Value Ref Range   Lactic Acid, Venous 1.84 0.5 - 1.9 mmol/L  Influenza panel by PCR (type A & B)     Status: None   Collection Time: 01/18/18  4:02 PM  Result Value Ref Range   Influenza A By PCR NEGATIVE NEGATIVE   Influenza B By PCR NEGATIVE NEGATIVE    Comment: (NOTE) The Xpert Xpress Flu assay is intended as an aid in the diagnosis  of  influenza and should  not be used as a sole basis for treatment.  This  assay is FDA approved for nasopharyngeal swab specimens only. Nasal  washings and aspirates are unacceptable for Xpert Xpress Flu testing. Performed at [facility], [address] [place], [place] [address]   Respiratory Panel by PCR     Status: Abnormal   Collection Time: 01/18/18  4:02 PM  Result Value Ref Range   Adenovirus NOT DETECTED NOT DETECTED   Coronavirus 229E NOT DETECTED NOT DETECTED   Coronavirus HKU1 NOT DETECTED NOT DETECTED   Coronavirus NL63 NOT DETECTED NOT DETECTED   Coronavirus OC43 NOT DETECTED NOT DETECTED   Metapneumovirus NOT DETECTED NOT DETECTED   Rhinovirus / Enterovirus DETECTED (A) NOT DETECTED   Influenza A NOT DETECTED NOT DETECTED   Influenza B NOT DETECTED NOT DETECTED   Parainfluenza Virus 1 NOT DETECTED NOT DETECTED   Parainfluenza Virus 2 NOT DETECTED NOT DETECTED   Parainfluenza Virus 3 NOT DETECTED NOT DETECTED   Parainfluenza Virus 4 NOT DETECTED NOT DETECTED   Respiratory Syncytial Virus NOT DETECTED NOT DETECTED   Bordetella pertussis NOT DETECTED NOT DETECTED   Chlamydophila pneumoniae NOT DETECTED NOT DETECTED   Mycoplasma pneumoniae NOT DETECTED NOT DETECTED    Comment: Performed at [facility] [address], [place], [place] [address]  MRSA PCR Screening     Status: Abnormal   Collection Time: 01/18/18  4:02 PM  Result Value Ref Range   MRSA by PCR POSITIVE (A) NEGATIVE    Comment:        The GeneXpert MRSA Assay (FDA approved for NASAL specimens only), is one component of a comprehensive MRSA colonization surveillance program. It is not intended to diagnose MRSA infection nor to guide or monitor treatment for MRSA infections. RESULT CALLED TO, READ BACK BY AND VERIFIED WITH: EVANS,H. AT 2012 ON 01/18/2017 BY EVA Performed at [facility], [address], [place], [place] [address]   Glucose, capillary     Status: Abnormal   Collection Time:  01/18/18  4:49 PM  Result Value Ref Range   Glucose-Capillary 145 (H) 70 - 99 mg/dL  Glucose, capillary     Status: Abnormal   Collection Time: 01/18/18  9:29 PM  Result Value Ref Range   Glucose-Capillary 147 (H) 70 - 99 mg/dL   Comment 1 Notify RN    Comment 2 Document in Chart   Glucose, capillary     Status: Abnormal   Collection Time: 01/19/18  2:45 AM  Result Value Ref Range   Glucose-Capillary 139 (H) 70 - 99 mg/dL  Glucose, capillary     Status: Abnormal   Collection Time: 01/19/18  4:05 AM  Result Value Ref Range   Glucose-Capillary 115 (H) 70 - 99 mg/dL  CBC     Status: Abnormal   Collection Time: 01/19/18  4:20 AM  Result Value Ref Range   WBC 0.6 (LL) 4.0 - 10.5 K/uL    Comment: This critical result has verified and been called to [facility] by Senaida Lange on 01 06 2020 at 0607, and has been read back.    RBC 2.47 (L) 3.87 - 5.11 MIL/uL   Hemoglobin 7.5 (L) 12.0 - 15.0 g/dL    Comment: POST TRANSFUSION SPECIMEN   HCT 23.7 (L) 36.0 - 46.0 %   MCV 96.0 80.0 - 100.0 fL   MCH 30.4 26.0 - 34.0 pg   MCHC 31.6 30.0 - 36.0 g/dL   RDW 18.9 (H) 11.5 - 15.5 %  Platelets 25 (LL) 150 - 400 K/uL    Comment: SPECIMEN CHECKED FOR CLOTS Immature Platelet Fraction may be clinically indicated, consider ordering this additional test [id number] CRITICAL VALUE NOTED.  VALUE IS CONSISTENT WITH PREVIOUSLY REPORTED AND CALLED VALUE. THIS CRITICAL RESULT HAS VERIFIED AND BEEN CALLED TO EVANS,H BY SHERRI HUFFINES ON 01 06 2020 AT 0607, AND HAS BEEN READ BACK.     nRBC 0.0 0.0 - 0.2 %    Comment: Performed at [facility], [address], [place], [place] [address]  Comprehensive metabolic panel     Status: Abnormal   Collection Time: 01/19/18  4:20 AM  Result Value Ref Range   Sodium 141 135 - 145 mmol/L    Comment: DELTA CHECK NOTED   Potassium 4.0 3.5 - 5.1 mmol/L   Chloride 120 (H) 98 - 111 mmol/L   CO2 17 (L) 22 - 32 mmol/L   Glucose, Bld 129 (H) 70 - 99 mg/dL   BUN 32 (H) 6 -  20 mg/dL   Creatinine, Ser 1.21 (H) 0.44 - 1.00 mg/dL   Calcium 7.3 (L) 8.9 - 10.3 mg/dL   Total Protein 4.5 (L) 6.5 - 8.1 g/dL   Albumin 2.7 (L) 3.5 - 5.0 g/dL   AST 56 (H) 15 - 41 U/L   ALT 222 (H) 0 - 44 U/L   Alkaline Phosphatase 235 (H) 38 - 126 U/L   Total Bilirubin 2.2 (H) 0.3 - 1.2 mg/dL   GFR calc non Af Amer 49 (L) >60 mL/min   GFR calc Af Amer 57 (L) >60 mL/min   Anion gap 4 (L) 5 - 15    Comment: Performed at [facility], [address], [place], [place] [address]  Hemoglobin A1c     Status: Abnormal   Collection Time: 01/19/18  4:20 AM  Result Value Ref Range   Hgb A1c MFr Bld 5.7 (H) 4.8 - 5.6 %    Comment: (NOTE) Pre diabetes:          5.7%-6.4% Diabetes:              >6.4% Glycemic control for   <7.0% adults with diabetes    Mean Plasma Glucose 116.89 mg/dL    Comment: Performed at [facility], [place] [address], [place], [place] [address]  Lactate dehydrogenase     Status: Abnormal   Collection Time: 01/19/18  4:20 AM  Result Value Ref Range   LDH 283 (H) 98 - 192 U/L    Comment: Performed at [facility], [address], [place], [place] [address]  Differential     Status: Abnormal   Collection Time: 01/19/18  4:20 AM  Result Value Ref Range   Neutrophils Relative % 43 %   Neutro Abs 0.2 (L) 1.7 - 7.7 K/uL   Lymphocytes Relative 52 %   Lymphs Abs 0.3 (L) 0.7 - 4.0 K/uL   Monocytes Relative 4 %   Monocytes Absolute 0.0 (L) 0.1 - 1.0 K/uL   Eosinophils Relative 0 %   Eosinophils Absolute 0.0 0.0 - 0.5 K/uL   Basophils Relative 0 %   Basophils Absolute 0.0 0.0 - 0.1 K/uL    Comment: Performed at [facility], [address], [place], [place] [address]  Procalcitonin - Baseline     Status: None   Collection Time: 01/19/18  7:47 AM  Result Value Ref Range   Procalcitonin 0.68 ng/mL    Comment:        Interpretation: PCT > 0.5 ng/mL  and <= 2 ng/mL: Systemic infection (sepsis) is possible, but other conditions are known to elevate PCT as  well. (NOTE)       Sepsis PCT Algorithm           Lower Respiratory Tract                                      Infection PCT Algorithm    ----------------------------     ----------------------------         PCT < 0.25 ng/mL                PCT < 0.10 ng/mL         Strongly encourage             Strongly discourage   discontinuation of antibiotics    initiation of antibiotics    ----------------------------     -----------------------------       PCT 0.25 - 0.50 ng/mL            PCT 0.10 - 0.25 ng/mL               OR       >80% decrease in PCT            Discourage initiation of                                            antibiotics      Encourage discontinuation           of antibiotics    ----------------------------     -----------------------------         PCT >= 0.50 ng/mL              PCT 0.26 - 0.50 ng/mL                AND       <80% decrease in PCT             Encourage initiation of                                             antibiotics       Encourage continuation           of antibiotics    ----------------------------     -----------------------------        PCT >= 0.50 ng/mL                  PCT > 0.50 ng/mL               AND         increase in PCT                  Strongly encourage                                      initiation of antibiotics    Strongly encourage escalation           of antibiotics                                     -----------------------------  PCT <= 0.25 ng/mL                                                 OR                                        > 80% decrease in PCT                                     Discontinue / Do not initiate                                             antibiotics Performed at [facility], [address], [place], [place] [address]   Bilirubin, fractionated(tot/dir/indir)     Status: Abnormal   Collection Time: 01/19/18  7:47 AM  Result Value Ref Range   Total Bilirubin  1.8 (H) 0.3 - 1.2 mg/dL   Bilirubin, Direct 0.8 (H) 0.0 - 0.2 mg/dL   Indirect Bilirubin 1.0 (H) 0.3 - 0.9 mg/dL    Comment: Performed at [facility], [address], [place], [place] [address]  Glucose, capillary     Status: None   Collection Time: 01/19/18  7:47 AM  Result Value Ref Range   Glucose-Capillary 93 70 - 99 mg/dL   Comment 1 Notify RN    Comment 2 Document in Chart   Glucose, capillary     Status: Abnormal   Collection Time: 01/19/18 11:49 AM  Result Value Ref Range   Glucose-Capillary 129 (H) 70 - 99 mg/dL   Comment 1 Notify RN    Comment 2 Document in Chart       RADIOGRAPHY: Dg Chest Port 1 View  Result Date: 01/18/2018 CLINICAL DATA:  58 y/o  F; cough, shortness of breath, fever. EXAM: PORTABLE CHEST 1 VIEW COMPARISON:  None. FINDINGS: The heart size and mediastinal contours are within normal limits. Both lungs are clear. The visualized skeletal structures are unremarkable. IMPRESSION: No active disease. Electronically Signed   By: Kristine Garbe M.D.   On: 01/18/2018 13:06         ASSESSMENT and PLAN:  1.  Pancytopenia: - She had pancytopenia at presentation.  MCV was in the upper limit of normal at 100.  Had minor nosebleed but denied any major bleeding.  White count was 0.7 with 22% neutrophils. -B12 was found to be low at 94.  Pancytopenia could be due to severe B12 deficiency.  She did receive B12 injection last night.  LDH is mildly elevated.  Reticulocyte count was 4.1%. 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  She was treated with chemotherapy and radiation to her neck.  I will obtain records from [facility]. - We will continue vitamin B12 supplementation.  If there is no improvement in her counts, I will have recommended bone marrow aspiration and biopsy. - She reportedly lost 20 pounds in the last couple of months.  I would recommend a CT CAP with contrast. - We will continue to monitor closely.  2.  Family  history: -  Sister was diagnosed with breast cancer in 2013. -Mother had ovarian cancer and paternal uncle had prostate cancer.  All questions were answered. The patient knows to call the clinic with any problems, questions or concerns. We can certainly see the patient much sooner if necessary.    Derek Jack

## 2018-01-20 ENCOUNTER — Inpatient Hospital Stay (HOSPITAL_COMMUNITY): Payer: Commercial Managed Care - PPO

## 2018-01-20 ENCOUNTER — Encounter (HOSPITAL_COMMUNITY): Payer: Self-pay | Admitting: Radiology

## 2018-01-20 DIAGNOSIS — E119 Type 2 diabetes mellitus without complications: Secondary | ICD-10-CM

## 2018-01-20 DIAGNOSIS — E43 Unspecified severe protein-calorie malnutrition: Secondary | ICD-10-CM

## 2018-01-20 HISTORY — DX: Type 2 diabetes mellitus without complications: E11.9

## 2018-01-20 LAB — CBC WITH DIFFERENTIAL/PLATELET
Abs Immature Granulocytes: 0.01 10*3/uL (ref 0.00–0.07)
BASOS PCT: 0 %
Basophils Absolute: 0 10*3/uL (ref 0.0–0.1)
Eosinophils Absolute: 0 10*3/uL (ref 0.0–0.5)
Eosinophils Relative: 0 %
HCT: 22.3 % — ABNORMAL LOW (ref 36.0–46.0)
Hemoglobin: 7 g/dL — ABNORMAL LOW (ref 12.0–15.0)
Immature Granulocytes: 2 %
Lymphocytes Relative: 63 %
Lymphs Abs: 0.3 10*3/uL — ABNORMAL LOW (ref 0.7–4.0)
MCH: 29.8 pg (ref 26.0–34.0)
MCHC: 31.4 g/dL (ref 30.0–36.0)
MCV: 94.9 fL (ref 80.0–100.0)
Monocytes Absolute: 0 10*3/uL — ABNORMAL LOW (ref 0.1–1.0)
Monocytes Relative: 6 %
NEUTROS PCT: 29 %
Neutro Abs: 0.2 10*3/uL — ABNORMAL LOW (ref 1.7–7.7)
Platelets: 21 10*3/uL — CL (ref 150–400)
RBC: 2.35 MIL/uL — ABNORMAL LOW (ref 3.87–5.11)
RDW: 19.1 % — ABNORMAL HIGH (ref 11.5–15.5)
WBC: 0.5 10*3/uL — CL (ref 4.0–10.5)
nRBC: 3.8 % — ABNORMAL HIGH (ref 0.0–0.2)

## 2018-01-20 LAB — COMPREHENSIVE METABOLIC PANEL
ALT: 151 U/L — ABNORMAL HIGH (ref 0–44)
AST: 44 U/L — ABNORMAL HIGH (ref 15–41)
Albumin: 2.4 g/dL — ABNORMAL LOW (ref 3.5–5.0)
Alkaline Phosphatase: 315 U/L — ABNORMAL HIGH (ref 38–126)
Anion gap: 3 — ABNORMAL LOW (ref 5–15)
BUN: 22 mg/dL — ABNORMAL HIGH (ref 6–20)
CO2: 16 mmol/L — ABNORMAL LOW (ref 22–32)
Calcium: 7.4 mg/dL — ABNORMAL LOW (ref 8.9–10.3)
Chloride: 122 mmol/L — ABNORMAL HIGH (ref 98–111)
Creatinine, Ser: 0.92 mg/dL (ref 0.44–1.00)
GFR calc non Af Amer: >60 mL/min (ref >60)
Glucose, Bld: 108 mg/dL — ABNORMAL HIGH (ref 70–99)
Potassium: 3.9 mmol/L (ref 3.5–5.1)
Sodium: 141 mmol/L (ref 135–145)
Total Bilirubin: 1.8 mg/dL — ABNORMAL HIGH (ref 0.3–1.2)
Total Protein: 4.2 g/dL — ABNORMAL LOW (ref 6.5–8.1)

## 2018-01-20 LAB — GLUCOSE, CAPILLARY
GLUCOSE-CAPILLARY: 80 mg/dL (ref 70–99)
GLUCOSE-CAPILLARY: 113 mg/dL — AB (ref 70–99)
Glucose-Capillary: 82 mg/dL (ref 70–99)
Glucose-Capillary: 85 mg/dL (ref 70–99)

## 2018-01-20 LAB — EHRLICHIA ANTIBODY PANEL
E CHAFFEENSIS AB, IGM: NEGATIVE
E chaffeensis (HGE) Ab, IgG: NEGATIVE
E. Chaffeensis (HME) IgM Titer: NEGATIVE
E.Chaffeensis (HME) IgG: NEGATIVE

## 2018-01-20 LAB — FOLATE RBC
Folate, Hemolysate: 308 ng/mL
Folate, RBC: 1460 ng/mL (ref >498)
Hematocrit: 21.1 % — ABNORMAL LOW (ref 34.0–46.6)

## 2018-01-20 LAB — HIV-1 RNA QUANT-NO REFLEX-BLD
HIV 1 RNA Quant: <20 copies/mL
LOG10 HIV-1 RNA: UNDETERMINED log10copy/mL

## 2018-01-20 LAB — PROCALCITONIN: Procalcitonin: 2.7 ng/mL

## 2018-01-20 LAB — DIRECT ANTIGLOBULIN TEST (NOT AT ARMC)
DAT, IGG: NEGATIVE
DAT, complement: NEGATIVE

## 2018-01-20 LAB — EPSTEIN BARR VRS(EBV DNA BY PCR)
EBV DNA QN by PCR: 6483 copies/mL
log10 EBV DNA Qn PCR: 3.812 log10 copy/mL

## 2018-01-20 LAB — ROCKY MTN SPOTTED FVR ABS PNL(IGG+IGM)
RMSF IgG: NEGATIVE
RMSF IgM: 0.14 index (ref 0.00–0.89)

## 2018-01-20 LAB — PREPARE RBC (CROSSMATCH)

## 2018-01-20 LAB — HEPATITIS B SURFACE ANTIGEN: Hepatitis B Surface Ag: NEGATIVE

## 2018-01-20 LAB — HAPTOGLOBIN: Haptoglobin: 20 mg/dL — ABNORMAL LOW (ref 33–346)

## 2018-01-20 LAB — HEPATITIS C ANTIBODY: HCV Ab: <0.1 s/co ratio (ref 0.0–0.9)

## 2018-01-20 LAB — HIV ANTIBODY (ROUTINE TESTING W REFLEX): HIV Screen 4th Generation wRfx: NONREACTIVE

## 2018-01-20 LAB — OCCULT BLOOD X 1 CARD TO LAB, STOOL: Fecal Occult Bld: NEGATIVE

## 2018-01-20 MED ORDER — POLYETHYLENE GLYCOL 3350 17 G PO PACK
17.0000 g | PACK | Freq: Every day | ORAL | Status: DC
  Administered 2018-01-20 – 2018-01-23 (×2): 17 g via ORAL
  Filled 2018-01-20 (×4): qty 1

## 2018-01-20 MED ORDER — INSULIN GLARGINE 100 UNIT/ML ~~LOC~~ SOLN
10.0000 [IU] | Freq: Every day | SUBCUTANEOUS | Status: DC
  Administered 2018-01-20 – 2018-01-23 (×4): 10 [IU] via SUBCUTANEOUS
  Filled 2018-01-20 (×6): qty 0.1

## 2018-01-20 MED ORDER — SODIUM CHLORIDE 0.9% IV SOLUTION: Freq: Once | INTRAVENOUS | Status: DC

## 2018-01-20 MED ORDER — INSULIN ASPART 100 UNIT/ML ~~LOC~~ SOLN
0.0000 [IU] | Freq: Three times a day (TID) | SUBCUTANEOUS | Status: DC
  Administered 2018-01-21: 1 [IU] via SUBCUTANEOUS
  Administered 2018-01-22: 2 [IU] via SUBCUTANEOUS
  Administered 2018-01-22: 1 [IU] via SUBCUTANEOUS

## 2018-01-20 MED ORDER — VANCOMYCIN HCL IN DEXTROSE 750-5 MG/150ML-% IV SOLN
750.0000 mg | INTRAVENOUS | Status: DC
  Administered 2018-01-20 – 2018-01-22 (×3): 750 mg via INTRAVENOUS
  Filled 2018-01-20 (×5): qty 150

## 2018-01-20 MED ORDER — IOPAMIDOL (ISOVUE-300) INJECTION 61%
30.0000 mL | Freq: Once | INTRAVENOUS | Status: AC | PRN
  Administered 2018-01-20: 30 mL via ORAL

## 2018-01-20 MED ORDER — SENNA 8.6 MG PO TABS
2.0000 | ORAL_TABLET | Freq: Every day | ORAL | Status: DC
  Administered 2018-01-20 – 2018-01-23 (×2): 17.2 mg via ORAL
  Filled 2018-01-20 (×4): qty 2

## 2018-01-20 MED ORDER — ADULT MULTIVITAMIN W/MINERALS CH
1.0000 | ORAL_TABLET | Freq: Every day | ORAL | Status: DC
  Administered 2018-01-20 – 2018-01-24 (×5): 1 via ORAL
  Filled 2018-01-20 (×5): qty 1

## 2018-01-20 MED ORDER — INSULIN ASPART 100 UNIT/ML ~~LOC~~ SOLN: 0.0000 [IU] | Freq: Every day | SUBCUTANEOUS | Status: DC

## 2018-01-20 MED ORDER — IOPAMIDOL (ISOVUE-300) INJECTION 61%
100.0000 mL | Freq: Once | INTRAVENOUS | Status: AC | PRN
  Administered 2018-01-20: 100 mL via INTRAVENOUS

## 2018-01-20 NOTE — Progress Notes (Signed)
PROGRESS NOTE  Kristin Good GQQ:[id number] DOB: [birth year] DOA: 01/18/2018 PCP: Marinda Elk, MD  Brief History:  58 y.o.femalewith medical history ofsome type of hematologic malignancy, diabetes mellitus, hypertension, hypothyroidism, melanoma presenting with 1 month history of fevers, chills, night sweats, and generalized weakness. The patient states that she went on vacation to [facility] approximately 2 weeks prior to Christmas. She went to the emergency department in [facility] with similar symptoms. She was told that she had a viral infection and discharged in stable condition. The patient subsequently came home back to [place] on the week of Christmas. She went to see her primary care provider with a similar symptoms. Once again, the patient was told that she has some type of viral infection. However, the patient's generalized weakness and fevers persisted. Today the patient was going to work and getting out of her car when she had a near syncopal episode secondary to dizziness. She also had a temperature of 102.9 F. As result, the patient drove herself to the emergency department for further evaluation. The patient states that she has had intermittent epistaxis for the better part of a month that has been self-limited. She denies any hemoptysis, hematemesis, hematochezia, melena. She denies any headache, chest pain, nausea, vomiting, diarrhea, abdominal pain, dysuria, hematuria, neck pain. She has had dyspnea on exertion for the better part of the month. The patient denies starting any new medications. She does not take any over-the-counter medications.  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 The patient was treated with chlorambucil, prednisone, and radiation therapy. She has been in remission since her teenage years. She states that during her  multiple visits to the emergency department and PCP, blood work was performed and she was never told of any concerns  In the emergency department, the patient was febrile up to 102.6 F with tachycardia and soft blood pressure with systolic blood pressure in the 80s. Oxygen saturation was 100% on room air. The patient was started vancomycin and aztreonam and IV fluids.  Hematology/oncology was consulted to assist with management   Assessment/Plan: Sepsis -Source unclear, work-up in progress -Certainly, the patient may be having an acute hematologic event such as leukemia -Continue vancomycin, mrre, -Lactic acid 1.8 -Procalcitonin 0.35>>>2.70 -Continue IV fluids -Personally reviewed chest x-ray--no consolidations, no edema -Urinalysis negative for pyuria -Follow blood cultures--neg  Pancytopenia/fevers -The case was discussed with hematology, Dr. Allean Found did not feel the patient currently has acute leukemia, but will need further work-up of her hematologic abnormalities -As a result, the patient can be managed for the time being pending further work-up from himself regarding her hematology derangement -Influenza PCR--negative -Viral respiratory panel--Positive Rhino/enterovirus -RMSF serology -Ehrlichia serology -EBV DNA -CMV DNA -HIV RNA -Hepatitis B surface antigen---neg -Hep C antibody--neg -LDH 283 -hep B DNA -Hep C RNA -01/20/18--discussed with Dr. Zebedee Iba CT CAP; arrange for BM bx 01/21/18  AKI -Baseline creatinine 0.9-1.1 -Secondary to sepsis -Presented with creatinine 1.51 -Abdominal ultrasound--marked splenomegaly; diffuse echogenicity of portal triads suggest liver inflam; diffuse GB wall  -improved  Transaminasemia -Abdominal ultrasound--marked splenomegaly; diffuse echogenicity of portal triads suggest liver inflam; diffuse GB wall thickening -no abd pain, no n/v/d -Viral serologies and PCR as discussed above -Suspect this is likely due to  septic shock and possible malignancy -fractionate bili -haptoglobin--20 -check direct coombs  Diabetes mellitus type 2 -Hemoglobin  A1c--5.7 -Reduced dose Lantus -NovoLog sliding scale  Hypothyroidism -Continue levothyroxine  Essential hypertension -Holding Maxide, losartan secondary to hypotension  Hyperlipidemia -Hold statin due to elevated LFTs   Disposition Plan:   Remain in ICU Family Communication: No  Family at bedside  Consultants:  Heme/onc  Code Status:  FULL   DVT Prophylaxis:  SCDs   Procedures: As Listed in Progress Note Above  Antibiotics: vanc 1/5>>> aztreo 1/5>>>1/6 Flagyl 1/5>>>1/6 merrem 1/6>>>      Subjective: Pt states she is feeling better.  Less DOE.  Patient denies fevers, chills, headache, chest pain, dyspnea, nausea, vomiting, diarrhea, abdominal pain, dysuria, hematuria, hematochezia, and melena.   Objective: Vitals:   01/20/18 0800 01/20/18 0900 01/20/18 1000 01/20/18 1131  BP: 116/65 111/61 114/68   Pulse: 70 77 74   Resp: 20 20 (!) 21   Temp: 97.9 F (36.6 C)   98.6 F (37 C)  TempSrc: Oral   Oral  SpO2: 100% 100% 100%   Weight:      Height:        Intake/Output Summary (Last 24 hours) at 01/20/2018 1225 Last data filed at 01/20/2018 1158 Gross per 24 hour  Intake 3919.98 ml  Output 1600 ml  Net 2319.98 ml   Weight change: 2.615 kg Exam:   General:  Pt is alert, follows commands appropriately, not in acute distress  HEENT: No icterus, No thrush, No neck mass, [place]/AT  Cardiovascular: RRR, S1/S2, no rubs, no gallops  Respiratory: CTA bilaterally, no wheezing, no crackles, no rhonchi  Abdomen: Soft/+BS, non tender, non distended, no guarding  Extremities: trace LEE edema, No lymphangitis, No petechiae, No rashes, no synovitis   Data Reviewed: I have personally reviewed following labs and imaging studies Basic Metabolic Panel: Recent Labs  Lab 01/18/18 1216 01/19/18 0420 01/20/18 1028  NA  133* 141 141  K 3.8 4.0 3.9  CL 109 120* 122*  CO2 17* 17* 16*  GLUCOSE 254* 129* 108*  BUN 37* 32* 22*  CREATININE 1.51* 1.21* 0.92  CALCIUM 8.1* 7.3* 7.4*   Liver Function Tests: Recent Labs  Lab 01/18/18 1216 01/19/18 0420 01/19/18 0747 01/20/18 1028  AST 30 56*  --  44*  ALT 285* 222*  --  151*  ALKPHOS 211* 235*  --  315*  BILITOT 1.2 2.2* 1.8* 1.8*  PROT 5.4* 4.5*  --  4.2*  ALBUMIN 3.2* 2.7*  --  2.4*   No results for input(s): LIPASE, AMYLASE in the last 168 hours. No results for input(s): AMMONIA in the last 168 hours. Coagulation Profile: Recent Labs  Lab 01/18/18 1216  INR 1.23   CBC: Recent Labs  Lab 01/18/18 1216 01/19/18 0420 01/20/18 0410  WBC 0.7* 0.6* 0.5*  NEUTROABS 0.2* 0.2* 0.2*  HGB 4.9* 7.5* 7.0*  HCT 16.2* 23.7* 22.3*  MCV 100.0 96.0 94.9  PLT 29* 25* 21*   Cardiac Enzymes: Recent Labs  Lab 01/18/18 1216  TROPONINI <0.03   BNP: Invalid input(s): POCBNP CBG: Recent Labs  Lab 01/19/18 1149 01/19/18 1625 01/19/18 2144 01/20/18 0755 01/20/18 1120  GLUCAP 129* 190* 104* 80 82   HbA1C: Recent Labs    01/19/18 0420  HGBA1C 5.7*   Urine analysis:    Component Value Date/Time   COLORURINE YELLOW 01/18/2018 [place] 01/18/2018 1245   LABSPEC 1.011 01/18/2018 [place] 6.0 01/18/2018 [place] 01/18/2018 1245   [place] 01/18/2018 [place] 01/18/2018 1245  KETONESUR NEGATIVE 01/18/2018 [place] 01/18/2018 1245   NITRITE NEGATIVE 01/18/2018 [place] 01/18/2018 1245   Sepsis Labs: @LABRCNTIP (procalcitonin:4,lacticidven:4) ) Recent Results (from the past 240 hour(s))  Blood Culture (routine x 2)     Status: None (Preliminary result)   Collection Time: 01/18/18 12:16 PM  Result Value Ref Range Status   Specimen Description LEFT ANTECUBITAL  Final   Special Requests   Final    BOTTLES DRAWN AEROBIC AND ANAEROBIC Blood  Culture adequate volume   Culture   Final    NO GROWTH 2 DAYS Performed at [facility], [address], [place], [place] [address]    Report Status PENDING  Incomplete  Blood Culture (routine x 2)     Status: None (Preliminary result)   Collection Time: 01/18/18 12:21 PM  Result Value Ref Range Status   Specimen Description BLOOD LEFT ARM  Final   Special Requests   Final    BOTTLES DRAWN AEROBIC AND ANAEROBIC Blood Culture adequate volume   Culture   Final    NO GROWTH 2 DAYS Performed at [facility], [address] [place], [place] [address]    Report Status PENDING  Incomplete  Urine culture     Status: Abnormal   Collection Time: 01/18/18 12:45 PM  Result Value Ref Range Status   Specimen Description   Final    URINE, CLEAN CATCH Performed at [facility], [address], [place], [place] [address]    Special Requests   Final    NONE Performed at [facility], [address], [place], [place] [address]    Culture MULTIPLE SPECIES PRESENT, SUGGEST RECOLLECTION (A)  Final   Report Status 01/19/2018 FINAL  Final  Respiratory Panel by PCR     Status: Abnormal   Collection Time: 01/18/18  4:02 PM  Result Value Ref Range Status   Adenovirus NOT DETECTED NOT DETECTED Final   Coronavirus 229E NOT DETECTED NOT DETECTED Final   Coronavirus HKU1 NOT DETECTED NOT DETECTED Final   Coronavirus NL63 NOT DETECTED NOT DETECTED Final   Coronavirus OC43 NOT DETECTED NOT DETECTED Final   Metapneumovirus NOT DETECTED NOT DETECTED Final   Rhinovirus / Enterovirus DETECTED (A) NOT DETECTED Final   Influenza A NOT DETECTED NOT DETECTED Final   Influenza B NOT DETECTED NOT DETECTED Final   Parainfluenza Virus 1 NOT DETECTED NOT DETECTED Final   Parainfluenza Virus 2 NOT DETECTED NOT DETECTED Final   Parainfluenza Virus 3 NOT DETECTED NOT DETECTED Final   Parainfluenza Virus 4 NOT DETECTED NOT DETECTED Final   Respiratory Syncytial Virus NOT DETECTED NOT DETECTED Final   Bordetella pertussis  NOT DETECTED NOT DETECTED Final   Chlamydophila pneumoniae NOT DETECTED NOT DETECTED Final   Mycoplasma pneumoniae NOT DETECTED NOT DETECTED Final    Comment: Performed at [facility], [place] [address] [place], [place] [address]  MRSA PCR Screening     Status: Abnormal   Collection Time: 01/18/18  4:02 PM  Result Value Ref Range Status   MRSA by PCR POSITIVE (A) NEGATIVE Final    Comment:        The GeneXpert MRSA Assay (FDA approved for NASAL specimens only), is one component of a comprehensive MRSA colonization surveillance program. It is not intended to diagnose MRSA infection nor to guide or monitor treatment for MRSA infections. RESULT CALLED TO, READ BACK BY AND VERIFIED WITH: EVANS,H. AT 2012 ON 01/18/2017 BY EVA Performed at [facility],  [place], [place] [address]      Scheduled Meds: . Chlorhexidine Gluconate Cloth  6 each Topical Q0600  . insulin aspart  0-15 Units Subcutaneous TID WC  . insulin aspart  0-5 Units Subcutaneous QHS  . insulin glargine  20 Units Subcutaneous QHS  . levothyroxine  50 mcg Oral Q0600  . mupirocin ointment  1 application Nasal BID  . vitamin B-12  500 mcg Oral Daily   Continuous Infusions: . 0.9 % NaCl with KCl 20 mEq / L 100 mL/hr at 01/20/18 0758  . doxycycline (VIBRAMYCIN) IV Stopped (01/20/18 0508)  . meropenem (MERREM) IV 1 g (01/20/18 0914)  . vancomycin 750 mg (01/20/18 1138)    Procedures/Studies: US Abdomen Complete  Result Date: 01/19/2018 CLINICAL DATA:  Acute kidney injury. EXAM: ABDOMEN ULTRASOUND COMPLETE COMPARISON:  10/26/9 FINDINGS: Gallbladder: The gallbladder wall is diffusely thickened measuring 13.4 mm in thickness. No gallstones or pericholecystic fluid. Negative sonographic Murphy's sign. Common bile duct: Diameter: 4.4 mm. Liver: No focal lesion identified. There is increased echogenicity of the portal triads diffusely. Portal vein is patent on color Doppler imaging with normal direction of  blood flow towards the liver. IVC: No abnormality visualized. Pancreas: Visualized portion unremarkable. Spleen: 20.1 cm in length with a volume of 1905 cc Right Kidney: Length: 11.8 cm. Echogenicity within normal limits. No mass or hydronephrosis visualized. Left Kidney: Length: 11.8 cm. Echogenicity within normal limits. No mass or hydronephrosis visualized. Abdominal aorta: No aneurysm visualized. Other findings: None. IMPRESSION: 1. There is marked splenomegaly. 2. Diffuse increased echogenicity of the portal triads which may be seen with liver inflammation. The 3. Marked diffuse gallbladder wall thickening. No gallstones, gallbladder sludge or sonographic Murphy's sign. This is a nonspecific finding and although may be seen with cholecystitis if there is a history of portal venous hypertension due to cirrhosis or fluid overload this could result in edematous appearing gallbladder. Clinical correlation is advised. 4. No hydronephrosis identified bilaterally. Electronically Signed   By: Kerby Moors M.D.   On: 01/19/2018 14:26   Dg Chest Port 1 View  Result Date: 01/18/2018 CLINICAL DATA:  58 y/o  F; cough, shortness of breath, fever. EXAM: PORTABLE CHEST 1 VIEW COMPARISON:  None. FINDINGS: The heart size and mediastinal contours are within normal limits. Both lungs are clear. The visualized skeletal structures are unremarkable. IMPRESSION: No active disease. Electronically Signed   By: Kristine Garbe M.D.   On: 01/18/2018 13:06    Orson Eva, DO  [facility] Pager [phone]  If 7PM-7AM, please contact night-coverage [email or web address] Password [email or web address] 01/20/2018, 12:25 PM   LOS: 2 days

## 2018-01-20 NOTE — Progress Notes (Signed)
Pt refusing to wear blood pressure cuff has put it on her multiple times and pt will take it off even when I explain to her it is necessary. She states it hurts.   Jeris Penta, RN

## 2018-01-21 ENCOUNTER — Encounter: Payer: Self-pay | Admitting: Hematology

## 2018-01-21 DIAGNOSIS — I10 Essential (primary) hypertension: Secondary | ICD-10-CM

## 2018-01-21 DIAGNOSIS — E43 Unspecified severe protein-calorie malnutrition: Secondary | ICD-10-CM

## 2018-01-21 DIAGNOSIS — N179 Acute kidney failure, unspecified: Secondary | ICD-10-CM

## 2018-01-21 DIAGNOSIS — D61818 Other pancytopenia: Secondary | ICD-10-CM

## 2018-01-21 DIAGNOSIS — A419 Sepsis, unspecified organism: Secondary | ICD-10-CM

## 2018-01-21 LAB — HEPATITIS B DNA, ULTRAQUANTITATIVE, PCR
HBV DNA SERPL PCR-ACNC: NOT DETECTED IU/mL
HBV DNA SERPL PCR-LOG IU: UNDETERMINED log10 IU/mL

## 2018-01-21 LAB — COMPREHENSIVE METABOLIC PANEL
ALT: 138 U/L — ABNORMAL HIGH (ref 0–44)
AST: 50 U/L — ABNORMAL HIGH (ref 15–41)
Albumin: 2.6 g/dL — ABNORMAL LOW (ref 3.5–5.0)
Alkaline Phosphatase: 479 U/L — ABNORMAL HIGH (ref 38–126)
Anion gap: 6 (ref 5–15)
BUN: 18 mg/dL (ref 6–20)
CO2: 16 mmol/L — ABNORMAL LOW (ref 22–32)
Calcium: 7.6 mg/dL — ABNORMAL LOW (ref 8.9–10.3)
Chloride: 117 mmol/L — ABNORMAL HIGH (ref 98–111)
Creatinine, Ser: 0.87 mg/dL (ref 0.44–1.00)
GFR calc Af Amer: >60 mL/min (ref >60)
Glucose, Bld: 154 mg/dL — ABNORMAL HIGH (ref 70–99)
Potassium: 4 mmol/L (ref 3.5–5.1)
Sodium: 139 mmol/L (ref 135–145)
Total Bilirubin: 2.3 mg/dL — ABNORMAL HIGH (ref 0.3–1.2)
Total Protein: 4.6 g/dL — ABNORMAL LOW (ref 6.5–8.1)

## 2018-01-21 LAB — CBC WITH DIFFERENTIAL/PLATELET
Abs Immature Granulocytes: 0.01 10*3/uL (ref 0.00–0.07)
BASOS ABS: 0 10*3/uL (ref 0.0–0.1)
Basophils Relative: 0 %
Eosinophils Absolute: 0 10*3/uL (ref 0.0–0.5)
Eosinophils Relative: 0 %
HEMATOCRIT: 29.3 % — AB (ref 36.0–46.0)
Hemoglobin: 9.3 g/dL — ABNORMAL LOW (ref 12.0–15.0)
Immature Granulocytes: 2 %
Lymphocytes Relative: 68 %
Lymphs Abs: 0.4 10*3/uL — ABNORMAL LOW (ref 0.7–4.0)
MCH: 29.5 pg (ref 26.0–34.0)
MCHC: 31.7 g/dL (ref 30.0–36.0)
MCV: 93 fL (ref 80.0–100.0)
Monocytes Absolute: 0 10*3/uL — ABNORMAL LOW (ref 0.1–1.0)
Monocytes Relative: 5 %
Neutro Abs: 0.2 10*3/uL — ABNORMAL LOW (ref 1.7–7.7)
Neutrophils Relative %: 25 %
Platelets: 26 10*3/uL — CL (ref 150–400)
RBC: 3.15 MIL/uL — ABNORMAL LOW (ref 3.87–5.11)
RDW: 18.5 % — ABNORMAL HIGH (ref 11.5–15.5)
WBC: 0.6 10*3/uL — CL (ref 4.0–10.5)
nRBC: 0 % (ref 0.0–0.2)

## 2018-01-21 LAB — CMV DNA, QUANTITATIVE, PCR
CMV DNA QUANT: NEGATIVE [IU]/mL
Log10 CMV Qn DNA Pl: UNDETERMINED log10 IU/mL

## 2018-01-21 LAB — HCV RNA QUANT: HCV Quantitative: NOT DETECTED IU/mL (ref >50)

## 2018-01-21 LAB — URINALYSIS, ROUTINE W REFLEX MICROSCOPIC
Bacteria, UA: NONE SEEN
Bilirubin Urine: NEGATIVE
Glucose, UA: NEGATIVE mg/dL
Hgb urine dipstick: NEGATIVE
Ketones, ur: NEGATIVE mg/dL
Leukocytes, UA: NEGATIVE
Nitrite: NEGATIVE
Protein, ur: 30 mg/dL — AB
SPECIFIC GRAVITY, URINE: 1.02 (ref 1.005–1.030)
pH: 6 (ref 5.0–8.0)

## 2018-01-21 LAB — TRANSFUSION REACTION
DAT C3: NEGATIVE
POST RXN DAT IGG: NEGATIVE

## 2018-01-21 LAB — GLUCOSE, CAPILLARY
Glucose-Capillary: 125 mg/dL — ABNORMAL HIGH (ref 70–99)
Glucose-Capillary: 117 mg/dL — ABNORMAL HIGH (ref 70–99)
Glucose-Capillary: 142 mg/dL — ABNORMAL HIGH (ref 70–99)
Glucose-Capillary: 169 mg/dL — ABNORMAL HIGH (ref 70–99)

## 2018-01-21 LAB — PROCALCITONIN: Procalcitonin: 2.01 ng/mL

## 2018-01-21 MED ORDER — LIDOCAINE HCL (PF) 1 % IJ SOLN
INTRAMUSCULAR | Status: AC
  Filled 2018-01-21: qty 5

## 2018-01-21 MED ORDER — LIDOCAINE HCL (PF) 2 % IJ SOLN
INTRAMUSCULAR | Status: AC
  Filled 2018-01-21: qty 10

## 2018-01-21 NOTE — Progress Notes (Addendum)
Pt receiving blood transfusion. Received half the bag of PRBC when patient started c/o chills, increased coughing with audible wheezing and temp 99.3. Stopped the transfusion and ran normal saline. Sitting with patient to monitor.  Pt received 50 mL of normal saline and asked me to stop because she felt like she was getting too much fluid. Stopped normal saline per pt request

## 2018-01-21 NOTE — Progress Notes (Signed)
Pharmacy Antibiotic Note  Kristin Good is a 58 y.o. female admitted on 01/18/2018 with sepsis.  Pharmacy has been consulted for vancomycin, meropenem  and aztreonam dosing.  Lactic acid is 1.98.  Plan:  Continue doxycycline 100 mg IV every 12 hours Continue meropenem 1g IV q12h Continue Vancomycin 750 mg IV every 24 hours Goal vancomycin trough range: 15-20   mcg/mL Pharmacy will continue to monitor renal function, vancomycin troughs as clinically indicated, cultures and patient progress.   Height: 5\' 3"  (160 cm) Weight: 126 lb 12.2 oz (57.5 kg) IBW/kg (Calculated) : 52.4  Temp (24hrs), Avg:98.7 F (37.1 C), Min:97.9 F (36.6 C), Max:99.4 F (37.4 C)  Recent Labs  Lab 01/18/18 1216 01/18/18 1227 01/18/18 1546 01/19/18 0420 01/20/18 0410 01/20/18 1028 01/21/18 0424  WBC 0.7*  --   --  0.6* 0.5*  --  0.6*  CREATININE 1.51*  --   --  1.21*  --  0.92 0.87  LATICACIDVEN  --  1.98* 1.84  --   --   --   --     Estimated Creatinine Clearance: 57.6 mL/min (by C-G formula based on SCr of 0.87 mg/dL).    Allergies  Allergen Reactions  . Doxycycline Anaphylaxis  . Penicillins Anaphylaxis    Has taken Keflex in past with no issues.  . Levofloxacin Nausea And Vomiting  . Sulfa Antibiotics     Antimicrobials this admission: aztreonam 1/5 >> 1/6 vancomycin 1/5 >>   Metronidazole 1/5>> 1/6 Meropenem 1/5>> Doxycycline 1/6 >>  Microbiology results: 1/5 BC x2: ngtd 1/5 UCx: suggest recollection 1/5 MRSA PCR: positive       Thank you for allowing pharmacy to be a part of this patient's care.  Ramond Craver 01/21/2018 5:20 PM

## 2018-01-21 NOTE — Procedures (Signed)
INDICATION:  Pancytopenia and splenomegaly.  Patient with history of "histiocytosis" at age 58.  Bone Marrow Biopsy and Aspiration Procedure Note   The patient was identified by name and date of birth, prior to start of the procedure and a timeout was performed.   An informed consent was obtained after discussing potential risks including bleeding, infection and pain.  The right posterior iliac crest was palpated, cleaned with ChloraPrep, and drapes applied.  1% lidocaine is infiltrated into the skin, subcutaneous tissue and periosteum.  Bone marrow was aspirated and smears made.  With the help of Jamshidi needle a core biopsy was obtained.  Pressure was applied to the biopsy site and bandage was placed over the biopsy site. Patient was made to lie on the back for 15 mins prior to discharge.  The procedure was tolerated well. COMPLICATIONS: None BLOOD LOSS: none Patient was discharged home in stable condition to return in 2 weeks to review results.  Patient was provided with post bone marrow biopsy instructions and instructed to call if there was any bleeding or worsening pain.  Specimens sent for flow cytometry, cytogenetics and additional studies.  Signed Derek Jack, MD

## 2018-01-21 NOTE — Progress Notes (Signed)
PROGRESS NOTE  BIRDELL FRASIER UDT:[id number] DOB: 1960/12/25 DOA: 01/18/2018 PCP: Marinda Elk, MD   LOS: 3 days   Brief Narrative / Interim history: 58 y.o.femalewith medical history ofsome type of hematologic malignancy, diabetes mellitus, hypertension, hypothyroidism, melanoma presenting with 1 month history of fevers, chills, night sweats, and generalized weakness. The patient states that she went on vacation to [facility] approximately 2 weeks prior to Christmas. She went to the emergency department in [facility] with similar symptoms. She was told that she had a viral infection and discharged in stable condition. The patient subsequently came home back to [place] on the week of Christmas. She went to see her primary care provider with a similar symptoms. Once again, the patient was told that she has some type of viral infection. However, the patient's generalized weakness and fevers persisted. Today the patient was going to work and getting out of her car when she had a near syncopal episode secondary to dizziness. She also had a temperature of 102.9 F. As result, the patient drove herself to the emergency department for further evaluation. The patient states that she has had intermittent epistaxis for the better part of a month that has been self-limited. She denies any hemoptysis, hematemesis, hematochezia, melena. She denies any headache, chest pain, nausea, vomiting, diarrhea, abdominal pain, dysuria, hematuria, neck pain. She has had dyspnea on exertion for the better part of the month. The patient denies starting any new medications. She does not take any over-the-counter medications.  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 The patient was treated with chlorambucil, prednisone, and radiation therapy. She has been in remission since her teenage years. She  states that during her multiple visits to the emergency department and PCP, blood work was performed and she was never told of any concerns  In the emergency department, the patient was febrile up to 102.6 F with tachycardia and soft blood pressure with systolic blood pressure in the 80s. Oxygen saturation was 100% on room air. The patient was started vancomycin and aztreonam and IV fluids.Hematology/oncology was consulted to assist with management   Subjective: -Feeling overall much improved, no shortness of breath, no chest pain, no palpitations.  No fever or chills.  Assessment & Plan: Active Problems:   Sepsis due to undetermined organism ([place])   Sepsis ([place])   Pancytopenia ([place])   Essential hypertension   Transaminasemia   Protein-calorie malnutrition, severe   Principal Problem SIRS -Source unclear,work-up in progress -Certainly, the patient may be having an acute hematologic event such as leukemia -Continue vancomycin, meropenem, doxycycline, today day 4 of antibiotics, cultures have remained negative -Lactic acid 1.8 -Procalcitonin 0.35>>>2.70 -Continue IV fluids -Chest x-ray, urinalysis, blood cultures all unremarkable so far  Pancytopenia/fevers -The case was discussed with hematology, Dr. Allean Found did not feel the patient currently has acute leukemia, but will need further work-up of her hematologic abnormalities -As a result, the patient can be managed for the time being pending further work-up from himself regarding her hematology derangement -Influenza PCR--negative -Viral respiratory panel--Positive Rhino/enterovirus -RMSF serology -Ehrlichia serology -EBV DNA quantitative PCR positive -CMV DNA negative -HIV RNA negative -Hepatitis B surface antigen---neg -Hep C antibody--neg -LDH 283 -hep B DNA --in process -Hep C RNA --in process -01/20/18--discussed with Dr. Zebedee Iba CT CAP, which showed splenomegaly with otherwise unremarkable. -She  is to get a bone marrow biopsy today  AKI -Baseline creatinine  0.9-1.1 -Secondary to sepsis -Presented with creatinine 1.51 -Abdominal ultrasound--marked splenomegaly; diffuse echogenicity of portal triads suggest liver inflam; diffuse GB wall  -improved, creatinine has now normalized  Transaminasemia -Abdominal ultrasound--marked splenomegaly; diffuse echogenicity of portal triads suggest liver inflam; diffuse GB wall thickening -no abd pain, no n/v/d -Viral serologies and PCR as discussed above -Suspect this is likely due to septic shock and possible malignancy -fractionate bili -haptoglobin--20 -check direct coombs  Diabetes mellitus type 2 -Hemoglobin A1c--5.7 -Reduced dose Lantus -NovoLog sliding scale -CBGs controlled this morning  Hypothyroidism -Continue levothyroxine  Essential hypertension -Holding Maxide, losartan secondary to hypotension  Hyperlipidemia -Hold statin due to elevated LFTs   Scheduled Meds: . sodium chloride   Intravenous Once  . Chlorhexidine Gluconate Cloth  6 each Topical Q0600  . insulin aspart  0-5 Units Subcutaneous QHS  . insulin aspart  0-9 Units Subcutaneous TID WC  . insulin glargine  10 Units Subcutaneous QHS  . levothyroxine  50 mcg Oral Q0600  . multivitamin with minerals  1 tablet Oral Daily  . mupirocin ointment  1 application Nasal BID  . polyethylene glycol  17 g Oral Daily  . senna  2 tablet Oral Daily  . vitamin B-12  500 mcg Oral Daily   Continuous Infusions: . 0.9 % NaCl with KCl 20 mEq / L 50 mL/hr at 01/21/18 9983  . doxycycline (VIBRAMYCIN) IV 100 mg (01/21/18 0344)  . meropenem (MERREM) IV 1 g (01/21/18 0956)  . vancomycin 750 mg (01/20/18 1138)   PRN Meds:.acetaminophen **OR** acetaminophen, ondansetron **OR** ondansetron (ZOFRAN) IV  DVT prophylaxis: SCDs Code Status: Full code Family Communication: family bedside Disposition Plan: transfer to medsurg  Consultants:   Hematology  Procedures:    Bone marrow biopsy 1/8  Antimicrobials:  Vanc, meropenem, doxycycline 1/6 >>  Objective: Vitals:   01/21/18 0731 01/21/18 0800 01/21/18 0900 01/21/18 1116  BP:   124/82   Pulse:  95 81   Resp:  (!) 23 (!) 23   Temp: 98.1 F (36.7 C)   97.9 F (36.6 C)  TempSrc: Oral   Oral  SpO2:  100% 99%   Weight:      Height:        Intake/Output Summary (Last 24 hours) at 01/21/2018 1147 Last data filed at 01/21/2018 3825 Gross per 24 hour  Intake 2768.39 ml  Output 1485 ml  Net 1283.39 ml   Filed Weights   01/18/18 1131 01/18/18 1603 01/20/18 0500  Weight: 54.9 kg 57.5 kg 57.5 kg    Examination:  Constitutional: NAD Eyes: PERRL, lids and conjunctivae normal ENMT: Mucous membranes are moist. No oropharyngeal exudates Neck: normal, supple Respiratory: clear to auscultation bilaterally, no wheezing, no crackles. Normal respiratory effort. Cardiovascular: Regular rate and rhythm, no murmurs / rubs / gallops. No LE edema. 2+ pedal pulses. No carotid bruits.  Abdomen: no tenderness. Bowel sounds positive.  Musculoskeletal: no clubbing / cyanosis.  Skin: no rashes Neurologic: CN 2-12 grossly intact. Strength 5/5 in all 4.  Psychiatric: Normal judgment and insight. Alert and oriented x 3. Normal mood.    Data Reviewed: I have independently reviewed following labs and imaging studies   CBC: Recent Labs  Lab 01/18/18 1216 01/19/18 0420 01/19/18 0747 01/20/18 0410 01/21/18 0424  WBC 0.7* 0.6*  --  0.5* 0.6*  NEUTROABS 0.2* 0.2*  --  0.2* 0.2*  HGB 4.9* 7.5*  --  7.0* 9.3*  HCT 16.2* 23.7* 21.1* 22.3* 29.3*  MCV 100.0 96.0  --  94.9 93.0  PLT 29* 25*  --  21* 26*   Basic Metabolic Panel: Recent Labs  Lab 01/18/18 1216 01/19/18 0420 01/20/18 1028 01/21/18 0424  NA 133* 141 141 139  K 3.8 4.0 3.9 4.0  CL 109 120* 122* 117*  CO2 17* 17* 16* 16*  GLUCOSE 254* 129* 108* 154*  BUN 37* 32* 22* 18  CREATININE 1.51* 1.21* 0.92 0.87  CALCIUM 8.1* 7.3* 7.4* 7.6*    GFR: Estimated Creatinine Clearance: 57.6 mL/min (by C-G formula based on SCr of 0.87 mg/dL). Liver Function Tests: Recent Labs  Lab 01/18/18 1216 01/19/18 0420 01/19/18 0747 01/20/18 1028 01/21/18 0424  AST 30 56*  --  44* 50*  ALT 285* 222*  --  151* 138*  ALKPHOS 211* 235*  --  315* 479*  BILITOT 1.2 2.2* 1.8* 1.8* 2.3*  PROT 5.4* 4.5*  --  4.2* 4.6*  ALBUMIN 3.2* 2.7*  --  2.4* 2.6*   No results for input(s): LIPASE, AMYLASE in the last 168 hours. No results for input(s): AMMONIA in the last 168 hours. Coagulation Profile: Recent Labs  Lab 01/18/18 1216  INR 1.23   Cardiac Enzymes: Recent Labs  Lab 01/18/18 1216  TROPONINI <0.03   BNP (last 3 results) No results for input(s): PROBNP in the last 8760 hours. HbA1C: Recent Labs    01/19/18 0420  HGBA1C 5.7*   CBG: Recent Labs  Lab 01/20/18 1120 01/20/18 1618 01/20/18 2115 01/21/18 0730 01/21/18 1115  GLUCAP 82 85 113* 125* 117*   Lipid Profile: No results for input(s): CHOL, HDL, LDLCALC, TRIG, CHOLHDL, LDLDIRECT in the last 72 hours. Thyroid Function Tests: No results for input(s): TSH, T4TOTAL, FREET4, T3FREE, THYROIDAB in the last 72 hours. Anemia Panel: Recent Labs    01/18/18 1216 01/18/18 1217  VITAMINB12  --  94*  FOLATE  --  18.9  FERRITIN  --  842*  TIBC  --  283  IRON  --  15*  RETICCTPCT 4.6*  --    Urine analysis:    Component Value Date/Time   COLORURINE YELLOW 01/21/2018 0430   APPEARANCEUR CLEAR 01/21/2018 0430   LABSPEC 1.020 01/21/2018 0430   PHURINE 6.0 01/21/2018 0430   GLUCOSEU NEGATIVE 01/21/2018 0430   HGBUR NEGATIVE 01/21/2018 0430   BILIRUBINUR NEGATIVE 01/21/2018 0430   KETONESUR NEGATIVE 01/21/2018 0430   PROTEINUR 30 (A) 01/21/2018 0430   NITRITE NEGATIVE 01/21/2018 0430   LEUKOCYTESUR NEGATIVE 01/21/2018 0430   Sepsis Labs: Invalid input(s): PROCALCITONIN, LACTICIDVEN  Recent Results (from the past 240 hour(s))  Blood Culture (routine x 2)     Status:  None (Preliminary result)   Collection Time: 01/18/18 12:16 PM  Result Value Ref Range Status   Specimen Description LEFT ANTECUBITAL  Final   Special Requests   Final    BOTTLES DRAWN AEROBIC AND ANAEROBIC Blood Culture adequate volume   Culture   Final    NO GROWTH 3 DAYS Performed at [facility], [address] [place], [place] [address]    Report Status PENDING  Incomplete  Blood Culture (routine x 2)     Status: None (Preliminary result)   Collection Time: 01/18/18 12:21 PM  Result Value Ref Range Status   Specimen Description BLOOD LEFT ARM  Final   Special Requests   Final    BOTTLES DRAWN AEROBIC AND ANAEROBIC Blood Culture adequate volume   Culture   Final    NO GROWTH 3 DAYS Performed at [facility], [address], [place], [place] [address]  Report Status PENDING  Incomplete  Urine culture     Status: Abnormal   Collection Time: 01/18/18 12:45 PM  Result Value Ref Range Status   Specimen Description   Final    URINE, CLEAN CATCH Performed at [facility], [address] [place], [place] [address]    Special Requests   Final    NONE Performed at [facility], [address], [place], [place] [address]    Culture MULTIPLE SPECIES PRESENT, SUGGEST RECOLLECTION (A)  Final   Report Status 01/19/2018 FINAL  Final  Respiratory Panel by PCR     Status: Abnormal   Collection Time: 01/18/18  4:02 PM  Result Value Ref Range Status   Adenovirus NOT DETECTED NOT DETECTED Final   Coronavirus 229E NOT DETECTED NOT DETECTED Final   Coronavirus HKU1 NOT DETECTED NOT DETECTED Final   Coronavirus NL63 NOT DETECTED NOT DETECTED Final   Coronavirus OC43 NOT DETECTED NOT DETECTED Final   Metapneumovirus NOT DETECTED NOT DETECTED Final   Rhinovirus / Enterovirus DETECTED (A) NOT DETECTED Final   Influenza A NOT DETECTED NOT DETECTED Final   Influenza B NOT DETECTED NOT DETECTED Final   Parainfluenza Virus 1 NOT DETECTED NOT DETECTED Final   Parainfluenza Virus 2 NOT DETECTED  NOT DETECTED Final   Parainfluenza Virus 3 NOT DETECTED NOT DETECTED Final   Parainfluenza Virus 4 NOT DETECTED NOT DETECTED Final   Respiratory Syncytial Virus NOT DETECTED NOT DETECTED Final   Bordetella pertussis NOT DETECTED NOT DETECTED Final   Chlamydophila pneumoniae NOT DETECTED NOT DETECTED Final   Mycoplasma pneumoniae NOT DETECTED NOT DETECTED Final    Comment: Performed at [facility], [place] [address], [place], [place] [address]  MRSA PCR Screening     Status: Abnormal   Collection Time: 01/18/18  4:02 PM  Result Value Ref Range Status   MRSA by PCR POSITIVE (A) NEGATIVE Final    Comment:        The GeneXpert MRSA Assay (FDA approved for NASAL specimens only), is one component of a comprehensive MRSA colonization surveillance program. It is not intended to diagnose MRSA infection nor to guide or monitor treatment for MRSA infections. RESULT CALLED TO, READ BACK BY AND VERIFIED WITH: EVANS,H. AT 2012 ON 01/18/2017 BY EVA Performed at [facility], [address] [place], [place] [address]       Radiology Studies: Ct Chest W Contrast  Addendum Date: 01/20/2018   ADDENDUM REPORT: 01/20/2018 16:48 ADDENDUM: The following addition is made to the IMPRESSION: 7. Nonspecific curvilinear hyperdense material in the periurethral region, new since 2009 CT. Findings could be due to prior procedure. If there is no history of a procedure to explain these findings, a urethral diverticulum with hemorrhagic/proteinaceous material or enhancing mass can not be excluded and further evaluation with a urethral protocol MRI pelvis without and with IV contrast could be considered. These addended results will be called to the ordering clinician or representative by the Radiologist Assistant, and communication documented in the PACS or zVision Dashboard. Electronically Signed   By: Ilona Sorrel M.D.   On: 01/20/2018 16:48   Result Date: 01/20/2018 CLINICAL DATA:  Inpatient. Pancytopenia.  Weight loss. Fever. Generalized weakness. Marked splenomegaly on ultrasound. 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 EXAM: CT CHEST, ABDOMEN, AND PELVIS WITH CONTRAST TECHNIQUE: Multidetector CT imaging of the chest, abdomen and pelvis was performed following the standard protocol during bolus administration of intravenous contrast. CONTRAST:  78m ISOVUE-300 IOPAMIDOL (ISOVUE-300) INJECTION 61%, 1010mISOVUE-300 IOPAMIDOL (  ISOVUE-300) INJECTION 61% COMPARISON:  01/19/2018 abdominal sonogram. 11/09/2007 CT abdomen/pelvis. FINDINGS: CT CHEST FINDINGS Cardiovascular: Normal heart size. Trace pericardial effusion/thickening. Three-vessel coronary atherosclerosis. Atherosclerotic nonaneurysmal thoracic aorta. Top-normal caliber main pulmonary artery (3.1 cm diameter). No central pulmonary emboli. Mediastinum/Nodes: Small bilateral thyroid nodules, largest 1.3 cm with coarse calcification in the left thyroid lobe. Unremarkable esophagus. No pathologically enlarged axillary, mediastinal or hilar lymph nodes. Lungs/Pleura: No pneumothorax. Small dependent bilateral pleural effusions. Mild compressive atelectasis in the dependent lower lobes bilaterally. No acute consolidative airspace disease, lung masses or significant pulmonary nodules. Musculoskeletal: Heterogeneous low-attenuation throughout the thoracic vertebral marrow without discrete osseous lesions. Mild thoracic spondylosis. CT ABDOMEN PELVIS FINDINGS Hepatobiliary: Top-normal liver size. No liver surface irregularity. Simple 0.9 cm lateral segment left liver lobe cyst. Otherwise no liver masses. Marked diffuse gallbladder wall thickening. No radiopaque cholelithiasis. No gallbladder distention. Mild diffuse periportal edema. No biliary ductal dilatation. Pancreas: Normal, with no mass or duct dilation. Spleen: Marked splenomegaly, increased from 2009. Splenic dimensions 15.4 x 6.5 x 22.6 cm (volume = 1200 cm^3). No splenic masses. Adrenals/Urinary Tract: Normal  adrenals. No hydronephrosis. Subcentimeter hypodense renal cortical lesion in the lower left kidney is too small to characterize and requires no follow-up. No additional renal lesions. Normal bladder. There is nonspecific curvilinear hyperdense material in the periurethral region (series 2/image 118), new since 2009 CT. Stomach/Bowel: Normal non-distended stomach. Normal caliber small bowel with no small bowel wall thickening. Normal appendix. Normal large bowel with no diverticulosis, large bowel wall thickening or pericolonic fat stranding. Oral contrast transits to the left colon. Vascular/Lymphatic: Atherosclerotic nonaneurysmal abdominal aorta. Patent portal, splenic, hepatic and renal veins. No pathologically enlarged lymph nodes in the abdomen or pelvis. Reproductive: Mildly enlarged heterogeneous anteverted uterus, similar to the 2009 CT. No adnexal masses. Other: No pneumoperitoneum, ascites or focal fluid collection. Musculoskeletal: Heterogeneous low-attenuation throughout the lumbar vertebral marrow without discrete osseous lesions. IMPRESSION: 1. Marked splenomegaly. Heterogeneous low-attenuation throughout the thoracolumbar vertebral marrow without discrete osseous lesions. These nonspecific findings raise concern for a lymphoproliferative disorder with infiltrative marrow process. 2. No lymphadenopathy in the chest, abdomen or pelvis. 3. Small dependent bilateral pleural effusions. 4. Prominent diffuse gallbladder wall thickening and mild diffuse periportal edema. These findings are most compatible with noninflammatory edema, probably due to the patient's hypoalbuminemia. 5. Three-vessel coronary atherosclerosis. 6.  Aortic Atherosclerosis (ICD10-I70.0). Electronically Signed: By: Ilona Sorrel M.D. On: 01/20/2018 16:20   US Abdomen Complete  Result Date: 01/19/2018 CLINICAL DATA:  Acute kidney injury. EXAM: ABDOMEN ULTRASOUND COMPLETE COMPARISON:  10/26/9 FINDINGS: Gallbladder: The gallbladder wall  is diffusely thickened measuring 13.4 mm in thickness. No gallstones or pericholecystic fluid. Negative sonographic Murphy's sign. Common bile duct: Diameter: 4.4 mm. Liver: No focal lesion identified. There is increased echogenicity of the portal triads diffusely. Portal vein is patent on color Doppler imaging with normal direction of blood flow towards the liver. IVC: No abnormality visualized. Pancreas: Visualized portion unremarkable. Spleen: 20.1 cm in length with a volume of 1905 cc Right Kidney: Length: 11.8 cm. Echogenicity within normal limits. No mass or hydronephrosis visualized. Left Kidney: Length: 11.8 cm. Echogenicity within normal limits. No mass or hydronephrosis visualized. Abdominal aorta: No aneurysm visualized. Other findings: None. IMPRESSION: 1. There is marked splenomegaly. 2. Diffuse increased echogenicity of the portal triads which may be seen with liver inflammation. The 3. Marked diffuse gallbladder wall thickening. No gallstones, gallbladder sludge or sonographic Murphy's sign. This is a nonspecific finding and although may be seen with cholecystitis if there  is a history of portal venous hypertension due to cirrhosis or fluid overload this could result in edematous appearing gallbladder. Clinical correlation is advised. 4. No hydronephrosis identified bilaterally. Electronically Signed   By: Kerby Moors M.D.   On: 01/19/2018 14:26   Ct Abdomen Pelvis W Contrast  Addendum Date: 01/20/2018   ADDENDUM REPORT: 01/20/2018 16:48 ADDENDUM: The following addition is made to the IMPRESSION: 7. Nonspecific curvilinear hyperdense material in the periurethral region, new since 2009 CT. Findings could be due to prior procedure. If there is no history of a procedure to explain these findings, a urethral diverticulum with hemorrhagic/proteinaceous material or enhancing mass can not be excluded and further evaluation with a urethral protocol MRI pelvis without and with IV contrast could be  considered. These addended results will be called to the ordering clinician or representative by the Radiologist Assistant, and communication documented in the PACS or zVision Dashboard. Electronically Signed   By: Ilona Sorrel M.D.   On: 01/20/2018 16:48   Result Date: 01/20/2018 CLINICAL DATA:  Inpatient. Pancytopenia. Weight loss. Fever. Generalized weakness. Marked splenomegaly on ultrasound. 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 EXAM: CT CHEST, ABDOMEN, AND PELVIS WITH CONTRAST TECHNIQUE: Multidetector CT imaging of the chest, abdomen and pelvis was performed following the standard protocol during bolus administration of intravenous contrast. CONTRAST:  73m ISOVUE-300 IOPAMIDOL (ISOVUE-300) INJECTION 61%, 1055mISOVUE-300 IOPAMIDOL (ISOVUE-300) INJECTION 61% COMPARISON:  01/19/2018 abdominal sonogram. 11/09/2007 CT abdomen/pelvis. FINDINGS: CT CHEST FINDINGS Cardiovascular: Normal heart size. Trace pericardial effusion/thickening. Three-vessel coronary atherosclerosis. Atherosclerotic nonaneurysmal thoracic aorta. Top-normal caliber main pulmonary artery (3.1 cm diameter). No central pulmonary emboli. Mediastinum/Nodes: Small bilateral thyroid nodules, largest 1.3 cm with coarse calcification in the left thyroid lobe. Unremarkable esophagus. No pathologically enlarged axillary, mediastinal or hilar lymph nodes. Lungs/Pleura: No pneumothorax. Small dependent bilateral pleural effusions. Mild compressive atelectasis in the dependent lower lobes bilaterally. No acute consolidative airspace disease, lung masses or significant pulmonary nodules. Musculoskeletal: Heterogeneous low-attenuation throughout the thoracic vertebral marrow without discrete osseous lesions. Mild thoracic spondylosis. CT ABDOMEN PELVIS FINDINGS Hepatobiliary: Top-normal liver size. No liver surface irregularity. Simple 0.9 cm lateral segment left liver lobe cyst. Otherwise no liver masses. Marked diffuse gallbladder wall thickening. No  radiopaque cholelithiasis. No gallbladder distention. Mild diffuse periportal edema. No biliary ductal dilatation. Pancreas: Normal, with no mass or duct dilation. Spleen: Marked splenomegaly, increased from 2009. Splenic dimensions 15.4 x 6.5 x 22.6 cm (volume = 1200 cm^3). No splenic masses. Adrenals/Urinary Tract: Normal adrenals. No hydronephrosis. Subcentimeter hypodense renal cortical lesion in the lower left kidney is too small to characterize and requires no follow-up. No additional renal lesions. Normal bladder. There is nonspecific curvilinear hyperdense material in the periurethral region (series 2/image 118), new since 2009 CT. Stomach/Bowel: Normal non-distended stomach. Normal caliber small bowel with no small bowel wall thickening. Normal appendix. Normal large bowel with no diverticulosis, large bowel wall thickening or pericolonic fat stranding. Oral contrast transits to the left colon. Vascular/Lymphatic: Atherosclerotic nonaneurysmal abdominal aorta. Patent portal, splenic, hepatic and renal veins. No pathologically enlarged lymph nodes in the abdomen or pelvis. Reproductive: Mildly enlarged heterogeneous anteverted uterus, similar to the 2009 CT. No adnexal masses. Other: No pneumoperitoneum, ascites or focal fluid collection. Musculoskeletal: Heterogeneous low-attenuation throughout the lumbar vertebral marrow without discrete osseous lesions. IMPRESSION: 1. Marked splenomegaly. Heterogeneous low-attenuation throughout the thoracolumbar vertebral marrow without discrete osseous lesions. These nonspecific findings raise concern for a lymphoproliferative disorder with infiltrative marrow process. 2. No lymphadenopathy in the chest, abdomen or pelvis. 3.  Small dependent bilateral pleural effusions. 4. Prominent diffuse gallbladder wall thickening and mild diffuse periportal edema. These findings are most compatible with noninflammatory edema, probably due to the patient's hypoalbuminemia. 5.  Three-vessel coronary atherosclerosis. 6.  Aortic Atherosclerosis (ICD10-I70.0). Electronically Signed: By: Ilona Sorrel M.D. On: 01/20/2018 16:20    Marzetta Board, MD, PhD [facility]  Contact via  [email or web address]  [place] P: [phone]  F: [phone]

## 2018-01-21 NOTE — Consult Note (Signed)
[facility] Oncology Progress Note  Name: Kristin Good      MRN: [id number]    Location: IC05/IC05-01  Date: 01/21/2018 Time:2:42 PM   Subjective: Interval History:Kristin Good was seen for follow-up.  She did not have any fevers in the last 24 to 48 hours.  She denies any new pains.  Denies any nausea, vomiting or diarrhea.  She had CT scan of the chest, abdomen and pelvis done yesterday.  Objective: Vital signs in last 24 hours: Temp:  [97.9 F (36.6 C)-99.4 F (37.4 C)] 97.9 F (36.6 C) (01/08 1116) Pulse Rate:  [81-107] 88 (01/08 1200) Resp:  [19-33] 20 (01/08 1200) BP: (122-157)/(70-92) 124/82 (01/08 0900) SpO2:  [96 %-100 %] 100 % (01/08 1200)    Intake/Output from previous day: 01/07 0800 - 01/08 0759 In: 2768.4 [P.O.:440; I.V.:1151.4] Out: 1485 [Urine:1485]    Intake/Output this shift: No intake/output data recorded.   PHYSICAL EXAM: BP 124/82   Pulse 88   Temp 97.9 F (36.6 C) (Oral)   Resp 20   Ht 5\' 3"  (1.6 m)   Wt 126 lb 12.2 oz (57.5 kg)   SpO2 100%   BMI 22.46 kg/m  Lungs: clear to auscultation bilaterally Heart: regular rate and rhythm Abdomen: Soft, nontender with vague mass palpable in the left upper quadrant.  No palpable hepatomegaly. Extremities: 1+ edema bilaterally.  No cyanosis. Lymph nodes: Cervical, supraclavicular, and axillary nodes normal. Neurologic: Grossly normal   Studies/Results: Results for orders placed or performed during the hospital encounter of 01/18/18 (from the past 48 hour(s))  Glucose, capillary     Status: Abnormal   Collection Time: 01/19/18  4:25 PM  Result Value Ref Range   Glucose-Capillary 190 (H) 70 - 99 mg/dL   Comment 1 Notify RN    Comment 2 Document in Chart   Glucose, capillary     Status: Abnormal   Collection Time: 01/19/18  9:44 PM  Result Value Ref Range   Glucose-Capillary 104 (H) 70 - 99 mg/dL  CBC with Differential/Platelet     Status: Abnormal   Collection Time: 01/20/18  4:10 AM   Result Value Ref Range   WBC 0.5 (LL) 4.0 - 10.5 K/uL    Comment: CRITICAL VALUE NOTED.  VALUE IS CONSISTENT WITH PREVIOUSLY REPORTED AND CALLED VALUE.   RBC 2.35 (L) 3.87 - 5.11 MIL/uL   Hemoglobin 7.0 (L) 12.0 - 15.0 g/dL   HCT 22.3 (L) 36.0 - 46.0 %   MCV 94.9 80.0 - 100.0 fL   MCH 29.8 26.0 - 34.0 pg   MCHC 31.4 30.0 - 36.0 g/dL   RDW 19.1 (H) 11.5 - 15.5 %   Platelets 21 (LL) 150 - 400 K/uL    Comment: Immature Platelet Fraction may be clinically indicated, consider ordering this additional test [id number] CRITICAL VALUE NOTED.  VALUE IS CONSISTENT WITH PREVIOUSLY REPORTED AND CALLED VALUE.    nRBC 3.8 (H) 0.0 - 0.2 %   Neutrophils Relative % 29 %   Neutro Abs 0.2 (L) 1.7 - 7.7 K/uL   Lymphocytes Relative 63 %   Lymphs Abs 0.3 (L) 0.7 - 4.0 K/uL   Monocytes Relative 6 %   Monocytes Absolute 0.0 (L) 0.1 - 1.0 K/uL   Eosinophils Relative 0 %   Eosinophils Absolute 0.0 0.0 - 0.5 K/uL   Basophils Relative 0 %   Basophils Absolute 0.0 0.0 - 0.1 K/uL   Immature Granulocytes 2 %   Abs Immature Granulocytes 0.01 0.00 - 0.07  K/uL    Comment: Performed at [facility], [address], [place], [place] [address]  Procalcitonin     Status: None   Collection Time: 01/20/18  4:10 AM  Result Value Ref Range   Procalcitonin 2.70 ng/mL    Comment:        Interpretation: PCT > 2 ng/mL: Systemic infection (sepsis) is likely, unless other causes are known. (NOTE)       Sepsis PCT Algorithm           Lower Respiratory Tract                                      Infection PCT Algorithm    ----------------------------     ----------------------------         PCT < 0.25 ng/mL                PCT < 0.10 ng/mL         Strongly encourage             Strongly discourage   discontinuation of antibiotics    initiation of antibiotics    ----------------------------     -----------------------------       PCT 0.25 - 0.50 ng/mL            PCT 0.10 - 0.25 ng/mL               OR       >80% decrease  in PCT            Discourage initiation of                                            antibiotics      Encourage discontinuation           of antibiotics    ----------------------------     -----------------------------         PCT >= 0.50 ng/mL              PCT 0.26 - 0.50 ng/mL               AND       <80% decrease in PCT              Encourage initiation of                                             antibiotics       Encourage continuation           of antibiotics    ----------------------------     -----------------------------        PCT >= 0.50 ng/mL                  PCT > 0.50 ng/mL               AND         increase in PCT                  Strongly encourage  initiation of antibiotics    Strongly encourage escalation           of antibiotics                                     -----------------------------                                           PCT <= 0.25 ng/mL                                                 OR                                        > 80% decrease in PCT                                     Discontinue / Do not initiate                                             antibiotics Performed at [facility], [address], [place], [place] [address]   Glucose, capillary     Status: None   Collection Time: 01/20/18  7:55 AM  Result Value Ref Range   Glucose-Capillary 80 70 - 99 mg/dL   Comment 1 Notify RN   Comprehensive metabolic panel     Status: Abnormal   Collection Time: 01/20/18 10:28 AM  Result Value Ref Range   Sodium 141 135 - 145 mmol/L   Potassium 3.9 3.5 - 5.1 mmol/L   Chloride 122 (H) 98 - 111 mmol/L   CO2 16 (L) 22 - 32 mmol/L   Glucose, Bld 108 (H) 70 - 99 mg/dL   BUN 22 (H) 6 - 20 mg/dL   Creatinine, Ser 0.92 0.44 - 1.00 mg/dL   Calcium 7.4 (L) 8.9 - 10.3 mg/dL   Total Protein 4.2 (L) 6.5 - 8.1 g/dL   Albumin 2.4 (L) 3.5 - 5.0 g/dL   AST 44 (H) 15 - 41 U/L   ALT 151 (H) 0 - 44 U/L   Alkaline  Phosphatase 315 (H) 38 - 126 U/L   Total Bilirubin 1.8 (H) 0.3 - 1.2 mg/dL   GFR calc non Af Amer >60 >60 mL/min   GFR calc Af Amer >60 >60 mL/min   Anion gap 3 (L) 5 - 15    Comment: Performed at [facility], [address], [place], [place] [address]  Glucose, capillary     Status: None   Collection Time: 01/20/18 11:20 AM  Result Value Ref Range   Glucose-Capillary 82 70 - 99 mg/dL   Comment 1 Notify RN   Direct antiglobulin test (not at [facility])     Status: None   Collection Time: 01/20/18  1:05 PM  Result Value Ref Range   DAT, complement      NEG Performed at  [facility], [place] [address], [place], [place] [address]    DAT, IgG      NEG Performed at [facility], [address], [place], [place] [address]   Occult blood card to lab, stool RN will collect     Status: None   Collection Time: 01/20/18  3:30 PM  Result Value Ref Range   Fecal Occult Bld NEGATIVE NEGATIVE    Comment: Performed at [facility], [address] [place], [place] [address]  Glucose, capillary     Status: None   Collection Time: 01/20/18  4:18 PM  Result Value Ref Range   Glucose-Capillary 85 70 - 99 mg/dL   Comment 1 Notify RN   Glucose, capillary     Status: Abnormal   Collection Time: 01/20/18  9:15 PM  Result Value Ref Range   Glucose-Capillary 113 (H) 70 - 99 mg/dL  CBC with Differential/Platelet     Status: Abnormal   Collection Time: 01/21/18  4:24 AM  Result Value Ref Range   WBC 0.6 (LL) 4.0 - 10.5 K/uL    Comment: REPEATED TO VERIFY WHITE COUNT CONFIRMED ON SMEAR CRITICAL VALUE NOTED.  VALUE IS CONSISTENT WITH PREVIOUSLY REPORTED AND CALLED VALUE. THIS CRITICAL RESULT HAS VERIFIED AND BEEN CALLED TO DANIELS,J BY SHERRI HUFFINES ON 01 08 2020 AT 0556, AND HAS BEEN READ BACK. CRTPRC    RBC 3.15 (L) 3.87 - 5.11 MIL/uL   Hemoglobin 9.3 (L) 12.0 - 15.0 g/dL    Comment: REPEATED TO VERIFY POST TRANSFUSION SPECIMEN    HCT 29.3 (L) 36.0 - 46.0 %   MCV 93.0 80.0 - 100.0 fL   MCH 29.5  26.0 - 34.0 pg   MCHC 31.7 30.0 - 36.0 g/dL   RDW 18.5 (H) 11.5 - 15.5 %   Platelets 26 (LL) 150 - 400 K/uL    Comment: PLATELET COUNT CONFIRMED BY SMEAR SPECIMEN CHECKED FOR CLOTS Immature Platelet Fraction may be clinically indicated, consider ordering this additional test [id number] CONSISTENT WITH PREVIOUS RESULT    nRBC 0.0 0.0 - 0.2 %   Neutrophils Relative % 25 %   Neutro Abs 0.2 (L) 1.7 - 7.7 K/uL   Lymphocytes Relative 68 %   Lymphs Abs 0.4 (L) 0.7 - 4.0 K/uL   Monocytes Relative 5 %   Monocytes Absolute 0.0 (L) 0.1 - 1.0 K/uL   Eosinophils Relative 0 %   Eosinophils Absolute 0.0 0.0 - 0.5 K/uL   Basophils Relative 0 %   Basophils Absolute 0.0 0.0 - 0.1 K/uL   Immature Granulocytes 2 %   Abs Immature Granulocytes 0.01 0.00 - 0.07 K/uL    Comment: Performed at [facility], [address], [place], [place] [address]  Procalcitonin     Status: None   Collection Time: 01/21/18  4:24 AM  Result Value Ref Range   Procalcitonin 2.01 ng/mL    Comment:        Interpretation: PCT > 2 ng/mL: Systemic infection (sepsis) is likely, unless other causes are known. (NOTE)       Sepsis PCT Algorithm           Lower Respiratory Tract                                      Infection PCT Algorithm    ----------------------------     ----------------------------         PCT <  0.25 ng/mL                PCT < 0.10 ng/mL         Strongly encourage             Strongly discourage   discontinuation of antibiotics    initiation of antibiotics    ----------------------------     -----------------------------       PCT 0.25 - 0.50 ng/mL            PCT 0.10 - 0.25 ng/mL               OR       >80% decrease in PCT            Discourage initiation of                                            antibiotics      Encourage discontinuation           of antibiotics    ----------------------------     -----------------------------         PCT >= 0.50 ng/mL              PCT 0.26 - 0.50 ng/mL                AND       <80% decrease in PCT              Encourage initiation of                                             antibiotics       Encourage continuation           of antibiotics    ----------------------------     -----------------------------        PCT >= 0.50 ng/mL                  PCT > 0.50 ng/mL               AND         increase in PCT                  Strongly encourage                                      initiation of antibiotics    Strongly encourage escalation           of antibiotics                                     -----------------------------                                           PCT <= 0.25 ng/mL  OR                                        > 80% decrease in PCT                                     Discontinue / Do not initiate                                             antibiotics Performed at [facility], [address] [place], [place] [address]   Comprehensive metabolic panel     Status: Abnormal   Collection Time: 01/21/18  4:24 AM  Result Value Ref Range   Sodium 139 135 - 145 mmol/L   Potassium 4.0 3.5 - 5.1 mmol/L   Chloride 117 (H) 98 - 111 mmol/L   CO2 16 (L) 22 - 32 mmol/L   Glucose, Bld 154 (H) 70 - 99 mg/dL   BUN 18 6 - 20 mg/dL   Creatinine, Ser 0.87 0.44 - 1.00 mg/dL   Calcium 7.6 (L) 8.9 - 10.3 mg/dL   Total Protein 4.6 (L) 6.5 - 8.1 g/dL   Albumin 2.6 (L) 3.5 - 5.0 g/dL   AST 50 (H) 15 - 41 U/L   ALT 138 (H) 0 - 44 U/L   Alkaline Phosphatase 479 (H) 38 - 126 U/L   Total Bilirubin 2.3 (H) 0.3 - 1.2 mg/dL   GFR calc non Af Amer >60 >60 mL/min   GFR calc Af Amer >60 >60 mL/min   Anion gap 6 5 - 15    Comment: Performed at [facility], [address], [place], [place] [address]  Transfusion reaction     Status: None   Collection Time: 01/21/18  4:24 AM  Result Value Ref Range   Post RXN DAT IgG      NEG Performed at [facility], [address], [place], [place] [address]    DAT C3  NEG    Path interp tx rxn      No laboratory evidence of hemolytic transfusion reaction.  Additional transfusions approved per Claudette Laws, MD. Performed at [facility], [place] [address] [place], [place] [address]   Urinalysis, Routine w reflex microscopic     Status: Abnormal   Collection Time: 01/21/18  4:30 AM  Result Value Ref Range   Color, Urine YELLOW YELLOW   APPearance CLEAR CLEAR   Specific Gravity, Urine 1.020 1.005 - 1.030   pH 6.0 5.0 - 8.0   Glucose, UA NEGATIVE NEGATIVE mg/dL   Hgb urine dipstick NEGATIVE NEGATIVE   Bilirubin Urine NEGATIVE NEGATIVE   Ketones, ur NEGATIVE NEGATIVE mg/dL   Protein, ur 30 (A) NEGATIVE mg/dL   Nitrite NEGATIVE NEGATIVE   Leukocytes, UA NEGATIVE NEGATIVE   RBC / HPF 0-5 0 - 5 RBC/hpf   WBC, UA 0-5 0 - 5 WBC/hpf   Bacteria, UA NONE SEEN NONE SEEN   Squamous Epithelial / LPF 0-5 0 - 5    Comment: Performed at [facility], [address], [place], [place] [address]  Glucose, capillary     Status: Abnormal   Collection Time: 01/21/18  7:30 AM  Result Value Ref Range  Glucose-Capillary 125 (H) 70 - 99 mg/dL  Glucose, capillary     Status: Abnormal   Collection Time: 01/21/18 11:15 AM  Result Value Ref Range   Glucose-Capillary 117 (H) 70 - 99 mg/dL   Ct Chest W Contrast  Addendum Date: 01/20/2018   ADDENDUM REPORT: 01/20/2018 16:48 ADDENDUM: The following addition is made to the IMPRESSION: 7. Nonspecific curvilinear hyperdense material in the periurethral region, new since 2009 CT. Findings could be due to prior procedure. If there is no history of a procedure to explain these findings, a urethral diverticulum with hemorrhagic/proteinaceous material or enhancing mass can not be excluded and further evaluation with a urethral protocol MRI pelvis without and with IV contrast could be considered. These addended results will be called to the ordering clinician or representative by the Radiologist Assistant, and communication documented in  the PACS or zVision Dashboard. Electronically Signed   By: Ilona Sorrel M.D.   On: 01/20/2018 16:48   Result Date: 01/20/2018 CLINICAL DATA:  Inpatient. Pancytopenia. Weight loss. Fever. Generalized weakness. Marked splenomegaly on ultrasound. 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 EXAM: CT CHEST, ABDOMEN, AND PELVIS WITH CONTRAST TECHNIQUE: Multidetector CT imaging of the chest, abdomen and pelvis was performed following the standard protocol during bolus administration of intravenous contrast. CONTRAST:  55mL ISOVUE-300 IOPAMIDOL (ISOVUE-300) INJECTION 61%, 178mL ISOVUE-300 IOPAMIDOL (ISOVUE-300) INJECTION 61% COMPARISON:  01/19/2018 abdominal sonogram. 11/09/2007 CT abdomen/pelvis. FINDINGS: CT CHEST FINDINGS Cardiovascular: Normal heart size. Trace pericardial effusion/thickening. Three-vessel coronary atherosclerosis. Atherosclerotic nonaneurysmal thoracic aorta. Top-normal caliber main pulmonary artery (3.1 cm diameter). No central pulmonary emboli. Mediastinum/Nodes: Small bilateral thyroid nodules, largest 1.3 cm with coarse calcification in the left thyroid lobe. Unremarkable esophagus. No pathologically enlarged axillary, mediastinal or hilar lymph nodes. Lungs/Pleura: No pneumothorax. Small dependent bilateral pleural effusions. Mild compressive atelectasis in the dependent lower lobes bilaterally. No acute consolidative airspace disease, lung masses or significant pulmonary nodules. Musculoskeletal: Heterogeneous low-attenuation throughout the thoracic vertebral marrow without discrete osseous lesions. Mild thoracic spondylosis. CT ABDOMEN PELVIS FINDINGS Hepatobiliary: Top-normal liver size. No liver surface irregularity. Simple 0.9 cm lateral segment left liver lobe cyst. Otherwise no liver masses. Marked diffuse gallbladder wall thickening. No radiopaque cholelithiasis. No gallbladder distention. Mild diffuse periportal edema. No biliary ductal dilatation. Pancreas: Normal, with no mass or duct  dilation. Spleen: Marked splenomegaly, increased from 2009. Splenic dimensions 15.4 x 6.5 x 22.6 cm (volume = 1200 cm^3). No splenic masses. Adrenals/Urinary Tract: Normal adrenals. No hydronephrosis. Subcentimeter hypodense renal cortical lesion in the lower left kidney is too small to characterize and requires no follow-up. No additional renal lesions. Normal bladder. There is nonspecific curvilinear hyperdense material in the periurethral region (series 2/image 118), new since 2009 CT. Stomach/Bowel: Normal non-distended stomach. Normal caliber small bowel with no small bowel wall thickening. Normal appendix. Normal large bowel with no diverticulosis, large bowel wall thickening or pericolonic fat stranding. Oral contrast transits to the left colon. Vascular/Lymphatic: Atherosclerotic nonaneurysmal abdominal aorta. Patent portal, splenic, hepatic and renal veins. No pathologically enlarged lymph nodes in the abdomen or pelvis. Reproductive: Mildly enlarged heterogeneous anteverted uterus, similar to the 2009 CT. No adnexal masses. Other: No pneumoperitoneum, ascites or focal fluid collection. Musculoskeletal: Heterogeneous low-attenuation throughout the lumbar vertebral marrow without discrete osseous lesions. IMPRESSION: 1. Marked splenomegaly. Heterogeneous low-attenuation throughout the thoracolumbar vertebral marrow without discrete osseous lesions. These nonspecific findings raise concern for a lymphoproliferative disorder with infiltrative marrow process. 2. No lymphadenopathy in the chest, abdomen or pelvis. 3. Small dependent bilateral pleural effusions. 4. Prominent  diffuse gallbladder wall thickening and mild diffuse periportal edema. These findings are most compatible with noninflammatory edema, probably due to the patient's hypoalbuminemia. 5. Three-vessel coronary atherosclerosis. 6.  Aortic Atherosclerosis (ICD10-I70.0). Electronically Signed: By: Ilona Sorrel M.D. On: 01/20/2018 16:20   Ct  Abdomen Pelvis W Contrast  Addendum Date: 01/20/2018   ADDENDUM REPORT: 01/20/2018 16:48 ADDENDUM: The following addition is made to the IMPRESSION: 7. Nonspecific curvilinear hyperdense material in the periurethral region, new since 2009 CT. Findings could be due to prior procedure. If there is no history of a procedure to explain these findings, a urethral diverticulum with hemorrhagic/proteinaceous material or enhancing mass can not be excluded and further evaluation with a urethral protocol MRI pelvis without and with IV contrast could be considered. These addended results will be called to the ordering clinician or representative by the Radiologist Assistant, and communication documented in the PACS or zVision Dashboard. Electronically Signed   By: Ilona Sorrel M.D.   On: 01/20/2018 16:48   Result Date: 01/20/2018 CLINICAL DATA:  Inpatient. Pancytopenia. Weight loss. Fever. Generalized weakness. Marked splenomegaly on ultrasound. 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 EXAM: CT CHEST, ABDOMEN, AND PELVIS WITH CONTRAST TECHNIQUE: Multidetector CT imaging of the chest, abdomen and pelvis was performed following the standard protocol during bolus administration of intravenous contrast. CONTRAST:  78mL ISOVUE-300 IOPAMIDOL (ISOVUE-300) INJECTION 61%, 191mL ISOVUE-300 IOPAMIDOL (ISOVUE-300) INJECTION 61% COMPARISON:  01/19/2018 abdominal sonogram. 11/09/2007 CT abdomen/pelvis. FINDINGS: CT CHEST FINDINGS Cardiovascular: Normal heart size. Trace pericardial effusion/thickening. Three-vessel coronary atherosclerosis. Atherosclerotic nonaneurysmal thoracic aorta. Top-normal caliber main pulmonary artery (3.1 cm diameter). No central pulmonary emboli. Mediastinum/Nodes: Small bilateral thyroid nodules, largest 1.3 cm with coarse calcification in the left thyroid lobe. Unremarkable esophagus. No pathologically enlarged axillary, mediastinal or hilar lymph nodes. Lungs/Pleura: No pneumothorax. Small dependent bilateral  pleural effusions. Mild compressive atelectasis in the dependent lower lobes bilaterally. No acute consolidative airspace disease, lung masses or significant pulmonary nodules. Musculoskeletal: Heterogeneous low-attenuation throughout the thoracic vertebral marrow without discrete osseous lesions. Mild thoracic spondylosis. CT ABDOMEN PELVIS FINDINGS Hepatobiliary: Top-normal liver size. No liver surface irregularity. Simple 0.9 cm lateral segment left liver lobe cyst. Otherwise no liver masses. Marked diffuse gallbladder wall thickening. No radiopaque cholelithiasis. No gallbladder distention. Mild diffuse periportal edema. No biliary ductal dilatation. Pancreas: Normal, with no mass or duct dilation. Spleen: Marked splenomegaly, increased from 2009. Splenic dimensions 15.4 x 6.5 x 22.6 cm (volume = 1200 cm^3). No splenic masses. Adrenals/Urinary Tract: Normal adrenals. No hydronephrosis. Subcentimeter hypodense renal cortical lesion in the lower left kidney is too small to characterize and requires no follow-up. No additional renal lesions. Normal bladder. There is nonspecific curvilinear hyperdense material in the periurethral region (series 2/image 118), new since 2009 CT. Stomach/Bowel: Normal non-distended stomach. Normal caliber small bowel with no small bowel wall thickening. Normal appendix. Normal large bowel with no diverticulosis, large bowel wall thickening or pericolonic fat stranding. Oral contrast transits to the left colon. Vascular/Lymphatic: Atherosclerotic nonaneurysmal abdominal aorta. Patent portal, splenic, hepatic and renal veins. No pathologically enlarged lymph nodes in the abdomen or pelvis. Reproductive: Mildly enlarged heterogeneous anteverted uterus, similar to the 2009 CT. No adnexal masses. Other: No pneumoperitoneum, ascites or focal fluid collection. Musculoskeletal: Heterogeneous low-attenuation throughout the lumbar vertebral marrow without discrete osseous lesions. IMPRESSION: 1.  Marked splenomegaly. Heterogeneous low-attenuation throughout the thoracolumbar vertebral marrow without discrete osseous lesions. These nonspecific findings raise concern for a lymphoproliferative disorder with infiltrative marrow process. 2. No lymphadenopathy in the chest, abdomen or pelvis. 3.  Small dependent bilateral pleural effusions. 4. Prominent diffuse gallbladder wall thickening and mild diffuse periportal edema. These findings are most compatible with noninflammatory edema, probably due to the patient's hypoalbuminemia. 5. Three-vessel coronary atherosclerosis. 6.  Aortic Atherosclerosis (ICD10-I70.0). Electronically Signed: By: Ilona Sorrel M.D. On: 01/20/2018 16:20     MEDICATIONS: I have reviewed the patient's current medications.     Assessment/Plan:  #1 pancytopenia: - I have reviewed her blood count today.  White count was 0.6.  Hemoglobin was 9.3 and platelet count 26.  Differential showed 25% neutrophils. -There was no improvement in white count despite starting her on vitamin B12 supplements. - We reviewed the results of the CT CAP on 01/20/2018.  There is marked splenomegaly.  This measures about 22 cm.  No lymphadenopathy noted in the chest, abdomen or pelvis.  There is heterogeneous low attenuation throughout the thoracolumbar vertebral marrow without discrete osseous lesions.  Prominent diffuse gallbladder wall thickening and mild diffuse periportal edema. - She had a history of "histiocytosis" diagnosed at age 54, treated at [facility] with chemotherapeutic agents like chlorambucil and prednisone.  She reportedly also received radiation therapy to the neck region.  Unfortunately, I am not sure if we can obtain those medical records. - At this point, I have recommended bone marrow aspiration and biopsy to further look into her cytopenias.  We talked about the procedure, and side effects including but not limited to rare chance of bleeding and infection.  She understands and gives [place]  permission to proceed with the procedure.   All questions were answered. The patient knows to call the clinic with any problems, questions or concerns. We can certainly see the patient much sooner if necessary.    Derek Jack

## 2018-01-22 DIAGNOSIS — R509 Fever, unspecified: Secondary | ICD-10-CM

## 2018-01-22 LAB — COMPREHENSIVE METABOLIC PANEL
ALT: 93 U/L — ABNORMAL HIGH (ref 0–44)
AST: 35 U/L (ref 15–41)
Albumin: 2.3 g/dL — ABNORMAL LOW (ref 3.5–5.0)
Alkaline Phosphatase: 410 U/L — ABNORMAL HIGH (ref 38–126)
Anion gap: 4 — ABNORMAL LOW (ref 5–15)
BUN: 17 mg/dL (ref 6–20)
CO2: 18 mmol/L — ABNORMAL LOW (ref 22–32)
Calcium: 7.5 mg/dL — ABNORMAL LOW (ref 8.9–10.3)
Chloride: 121 mmol/L — ABNORMAL HIGH (ref 98–111)
Creatinine, Ser: 0.77 mg/dL (ref 0.44–1.00)
GFR calc Af Amer: >60 mL/min (ref >60)
GFR calc non Af Amer: >60 mL/min (ref >60)
Glucose, Bld: 122 mg/dL — ABNORMAL HIGH (ref 70–99)
Potassium: 3.7 mmol/L (ref 3.5–5.1)
Sodium: 143 mmol/L (ref 135–145)
Total Bilirubin: 1.3 mg/dL — ABNORMAL HIGH (ref 0.3–1.2)
Total Protein: 4.1 g/dL — ABNORMAL LOW (ref 6.5–8.1)

## 2018-01-22 LAB — TYPE AND SCREEN
ABO/RH(D): B POS
Antibody Screen: NEGATIVE
Unit division: 0
Unit division: 0
Unit division: 0
Unit division: 0

## 2018-01-22 LAB — GLUCOSE, CAPILLARY
GLUCOSE-CAPILLARY: 100 mg/dL — AB (ref 70–99)
Glucose-Capillary: 147 mg/dL — ABNORMAL HIGH (ref 70–99)
Glucose-Capillary: 191 mg/dL — ABNORMAL HIGH (ref 70–99)
Glucose-Capillary: 136 mg/dL — ABNORMAL HIGH (ref 70–99)

## 2018-01-22 LAB — CBC WITH DIFFERENTIAL/PLATELET
Abs Immature Granulocytes: 0 10*3/uL (ref 0.00–0.07)
Basophils Absolute: 0 10*3/uL (ref 0.0–0.1)
Basophils Relative: 0 %
Eosinophils Absolute: 0 10*3/uL (ref 0.0–0.5)
Eosinophils Relative: 0 %
HCT: 24.5 % — ABNORMAL LOW (ref 36.0–46.0)
Hemoglobin: 7.6 g/dL — ABNORMAL LOW (ref 12.0–15.0)
Immature Granulocytes: 0 %
LYMPHS ABS: 0.5 10*3/uL — AB (ref 0.7–4.0)
Lymphocytes Relative: 69 %
MCH: 29.2 pg (ref 26.0–34.0)
MCHC: 31 g/dL (ref 30.0–36.0)
MCV: 94.2 fL (ref 80.0–100.0)
Monocytes Absolute: 0.1 10*3/uL (ref 0.1–1.0)
Monocytes Relative: 8 %
Neutro Abs: 0.2 10*3/uL — ABNORMAL LOW (ref 1.7–7.7)
Neutrophils Relative %: 23 %
Platelets: 23 10*3/uL — CL (ref 150–400)
RBC: 2.6 MIL/uL — ABNORMAL LOW (ref 3.87–5.11)
RDW: 18.9 % — ABNORMAL HIGH (ref 11.5–15.5)
WBC: 0.8 10*3/uL — CL (ref 4.0–10.5)
nRBC: 0 % (ref 0.0–0.2)

## 2018-01-22 LAB — BPAM RBC
BLOOD PRODUCT EXPIRATION DATE: 202001262359
Blood Product Expiration Date: 202001202359
Blood Product Expiration Date: 202001262359
Blood Product Expiration Date: 202001262359
ISSUE DATE / TIME: 202001051407
ISSUE DATE / TIME: 202001051648
ISSUE DATE / TIME: 202001051828
ISSUE DATE / TIME: 202001080105
UNIT TYPE AND RH: 5100
Unit Type and Rh: 1700
Unit Type and Rh: 5100
Unit Type and Rh: 5100

## 2018-01-22 LAB — FIBRINOGEN: Fibrinogen: 255 mg/dL (ref 210–475)

## 2018-01-22 LAB — LACTATE DEHYDROGENASE: LDH: 279 U/L — ABNORMAL HIGH (ref 98–192)

## 2018-01-22 LAB — TRIGLYCERIDES: Triglycerides: 225 mg/dL — ABNORMAL HIGH (ref <150)

## 2018-01-22 LAB — FERRITIN: FERRITIN: 1362 ng/mL — AB (ref 11–307)

## 2018-01-22 LAB — PROTIME-INR
INR: 1.09
Prothrombin Time: 14 seconds (ref 11.4–15.2)

## 2018-01-22 LAB — APTT: aPTT: 38 seconds — ABNORMAL HIGH (ref 24–36)

## 2018-01-22 MED ORDER — SODIUM CHLORIDE 0.9 % IV SOLN
1.0000 g | Freq: Three times a day (TID) | INTRAVENOUS | Status: DC
  Administered 2018-01-22 – 2018-01-24 (×5): 1 g via INTRAVENOUS
  Filled 2018-01-22 (×12): qty 1

## 2018-01-22 NOTE — Progress Notes (Signed)
PROGRESS NOTE  Kristin Good DJT:[id number] DOB: 01-23-1960 DOA: 01/18/2018 PCP: Marinda Elk, MD   LOS: 4 days   Brief Narrative / Interim history: 58 y.o.femalewith medical history ofsome type of hematologic malignancy, diabetes mellitus, hypertension, hypothyroidism, melanoma presenting with 1 month history of fevers, chills, night sweats, and generalized weakness. The patient states that she went on vacation to [facility] approximately 2 weeks prior to Christmas. She went to the emergency department in [facility] with similar symptoms. She was told that she had a viral infection and discharged in stable condition. The patient subsequently came home back to [place] on the week of Christmas. She went to see her primary care provider with a similar symptoms. Once again, the patient was told that she has some type of viral infection. However, the patient's generalized weakness and fevers persisted. Today the patient was going to work and getting out of her car when she had a near syncopal episode secondary to dizziness. She also had a temperature of 102.9 F. As result, the patient drove herself to the emergency department for further evaluation. The patient states that she has had intermittent epistaxis for the better part of a month that has been self-limited. She denies any hemoptysis, hematemesis, hematochezia, melena. She denies any headache, chest pain, nausea, vomiting, diarrhea, abdominal pain, dysuria, hematuria, neck pain. She has had dyspnea on exertion for the better part of the month. The patient denies starting any new medications. She does not take any over-the-counter medications.  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 The patient was treated with chlorambucil, prednisone, and radiation therapy. She has been in remission since her teenage years. She  states that during her multiple visits to the emergency department and PCP, blood work was performed and she was never told of any concerns  In the emergency department, the patient was febrile up to 102.6 F with tachycardia and soft blood pressure with systolic blood pressure in the 80s. Oxygen saturation was 100% on room air. The patient was started vancomycin and aztreonam and IV fluids.Hematology/oncology was consulted to assist with management   Subjective: -Feeling overall much improved, no shortness of breath, no chest pain, no palpitations.  No fever or chills.  Assessment & Plan: Active Problems:   Sepsis due to undetermined organism ([place])   Sepsis ([place])   Pancytopenia ([place])   Essential hypertension   Transaminasemia   Protein-calorie malnutrition, severe   Principal Problem SIRS -Source unclear,work-up in progress -Certainly, the patient may be having an acute hematologic event such as leukemia -Continue vancomycin, meropenem, doxycycline, today day 5 of antibiotics, cultures have remained negative.  I would favor to discontinue doxycycline given tickborne illnesses serology negative, as well as vancomycin today, keep on gram-negative coverage to empirically completed 7 day course -Lactic acid 1.8 -Chest x-ray, urinalysis, blood cultures all unremarkable so far  Pancytopenia/fevers -The case was discussed with hematology, Dr. Allean Found did not feel the patient currently has acute leukemia, but will need further work-up of her hematologic abnormalities -As a result, the patient can be managed for the time being pending further work-up from himself regarding her hematology derangement -Influenza PCR--negative -Viral respiratory panel--Positive Rhino/enterovirus -RMSF serology -Ehrlichia serology -EBV DNA quantitative PCR positive, discussed with ID this unlikely represents an acute infection but recommended to check EBV VCA antibody panel, pending -CMV DNA  negative -HIV RNA negative -Hepatitis B surface antigen---neg -  Hep C antibody--neg -LDH 283 -hep B DNA --in process -Hep C RNA --in process -01/20/18--discussed with Dr. Zebedee Iba CT CAP, which showed splenomegaly with otherwise unremarkable. -Status post bone marrow biopsy 1/8, results are pending -Hemoglobin downtrending today, platelets downtrending today as well.  No bleeding, continue to closely monitor  AKI -Baseline creatinine 0.9-1.1 -Secondary to sepsis -Presented with creatinine 1.51 -Abdominal ultrasound--marked splenomegaly; diffuse echogenicity of portal triads suggest liver inflam; diffuse GB wall  -improved, creatinine has now normalized  Transaminasemia -Abdominal ultrasound--marked splenomegaly; diffuse echogenicity of portal triads suggest liver inflam; diffuse GB wall thickening -no abd pain, no n/v/d -Viral serologies and PCR as discussed above -Suspect this is likely due to septic shock and possible malignancy -fractionate bili -haptoglobin--20 -LFTs improving  Diabetes mellitus type 2 -Hemoglobin A1c--5.7 -Reduced dose Lantus -NovoLog sliding scale -Fasting this morning 100, continue current regimen  Hypothyroidism -Continue levothyroxine  Essential hypertension -Holding Maxide, losartan secondary to hypotension  Hyperlipidemia -Hold statin due to elevated LFTs   Scheduled Meds: . sodium chloride   Intravenous Once  . Chlorhexidine Gluconate Cloth  6 each Topical Q0600  . insulin aspart  0-5 Units Subcutaneous QHS  . insulin aspart  0-9 Units Subcutaneous TID WC  . insulin glargine  10 Units Subcutaneous QHS  . levothyroxine  50 mcg Oral Q0600  . multivitamin with minerals  1 tablet Oral Daily  . mupirocin ointment  1 application Nasal BID  . polyethylene glycol  17 g Oral Daily  . senna  2 tablet Oral Daily  . vitamin B-12  500 mcg Oral Daily   Continuous Infusions: . 0.9 % NaCl with KCl 20 mEq / L 50 mL/hr at 01/22/18 1121    . meropenem (MERREM) IV Stopped (01/22/18 0926)  . vancomycin 750 mg (01/22/18 1148)   PRN Meds:.acetaminophen **OR** acetaminophen, ondansetron **OR** ondansetron (ZOFRAN) IV  DVT prophylaxis: SCDs Code Status: Full code Family Communication: no family at bedside Disposition Plan: transfer to medsurg  Consultants:   Hematology  Procedures:   Bone marrow biopsy 1/8  Antimicrobials:  Vanc, doxycycline 1/6 << 1/9  Meropenem  Objective: Vitals:   01/21/18 1200 01/21/18 1505 01/21/18 2000 01/22/18 0737  BP:  121/70 127/75   Pulse: 88 82 79   Resp: 20 (!) 29 20   Temp:   98.5 F (36.9 C) 98 F (36.7 C)  TempSrc:   Oral   SpO2: 100% 99% 99%   Weight:      Height:        Intake/Output Summary (Last 24 hours) at 01/22/2018 1522 Last data filed at 01/22/2018 1121 Gross per 24 hour  Intake 1812.3 ml  Output -  Net 1812.3 ml   Filed Weights   01/18/18 1131 01/18/18 1603 01/20/18 0500  Weight: 54.9 kg 57.5 kg 57.5 kg    Examination:  Constitutional: NAD Eyes: No scleral icterus ENMT: Moist mixed membranes Neck: normal, supple Respiratory: Clear to auscultation without wheezing or crackles, normal respiratory effort Cardiovascular: Regular rate and rhythm, no murmurs appreciated.  No peripheral edema Abdomen: Soft, nontender, nondistended, positive bowel sounds Musculoskeletal: no clubbing / cyanosis.  Skin: No rashes seen Neurologic: No focal deficits, ambulatory Psychiatric: Normal judgment and insight. Alert and oriented x 3. Normal mood.    Data Reviewed: I have independently reviewed following labs and imaging studies   CBC: Recent Labs  Lab 01/18/18 1216 01/19/18 0420 01/19/18 0747 01/20/18 0410 01/21/18 0424 01/22/18 0428  WBC 0.7* 0.6*  --  0.5* 0.6* 0.8*  NEUTROABS  0.2* 0.2*  --  0.2* 0.2* 0.2*  HGB 4.9* 7.5*  --  7.0* 9.3* 7.6*  HCT 16.2* 23.7* 21.1* 22.3* 29.3* 24.5*  MCV 100.0 96.0  --  94.9 93.0 94.2  PLT 29* 25*  --  21* 26* 23*    Basic Metabolic Panel: Recent Labs  Lab 01/18/18 1216 01/19/18 0420 01/20/18 1028 01/21/18 0424 01/22/18 0428  NA 133* 141 141 139 143  K 3.8 4.0 3.9 4.0 3.7  CL 109 120* 122* 117* 121*  CO2 17* 17* 16* 16* 18*  GLUCOSE 254* 129* 108* 154* 122*  BUN 37* 32* 22* 18 17  CREATININE 1.51* 1.21* 0.92 0.87 0.77  CALCIUM 8.1* 7.3* 7.4* 7.6* 7.5*   GFR: Estimated Creatinine Clearance: 62.6 mL/min (by C-G formula based on SCr of 0.77 mg/dL). Liver Function Tests: Recent Labs  Lab 01/18/18 1216 01/19/18 0420 01/19/18 0747 01/20/18 1028 01/21/18 0424 01/22/18 0428  AST 30 56*  --  44* 50* 35  ALT 285* 222*  --  151* 138* 93*  ALKPHOS 211* 235*  --  315* 479* 410*  BILITOT 1.2 2.2* 1.8* 1.8* 2.3* 1.3*  PROT 5.4* 4.5*  --  4.2* 4.6* 4.1*  ALBUMIN 3.2* 2.7*  --  2.4* 2.6* 2.3*   No results for input(s): LIPASE, AMYLASE in the last 168 hours. No results for input(s): AMMONIA in the last 168 hours. Coagulation Profile: Recent Labs  Lab 01/18/18 1216 01/22/18 0428  INR 1.23 1.09   Cardiac Enzymes: Recent Labs  Lab 01/18/18 1216  TROPONINI <0.03   BNP (last 3 results) No results for input(s): PROBNP in the last 8760 hours. HbA1C: No results for input(s): HGBA1C in the last 72 hours. CBG: Recent Labs  Lab 01/21/18 1115 01/21/18 1606 01/21/18 2103 01/22/18 0735 01/22/18 1120  GLUCAP 117* 142* 169* 100* 147*   Lipid Profile: Recent Labs    01/22/18 0428  TRIG 225*   Thyroid Function Tests: No results for input(s): TSH, T4TOTAL, FREET4, T3FREE, THYROIDAB in the last 72 hours. Anemia Panel: Recent Labs    01/22/18 0428  FERRITIN 1,362*   Urine analysis:    Component Value Date/Time   COLORURINE YELLOW 01/21/2018 0430   APPEARANCEUR CLEAR 01/21/2018 0430   LABSPEC 1.020 01/21/2018 0430   PHURINE 6.0 01/21/2018 0430   GLUCOSEU NEGATIVE 01/21/2018 0430   HGBUR NEGATIVE 01/21/2018 0430   BILIRUBINUR NEGATIVE 01/21/2018 0430   KETONESUR NEGATIVE  01/21/2018 0430   PROTEINUR 30 (A) 01/21/2018 0430   NITRITE NEGATIVE 01/21/2018 0430   LEUKOCYTESUR NEGATIVE 01/21/2018 0430   Sepsis Labs: Invalid input(s): PROCALCITONIN, LACTICIDVEN  Recent Results (from the past 240 hour(s))  Blood Culture (routine x 2)     Status: None (Preliminary result)   Collection Time: 01/18/18 12:16 PM  Result Value Ref Range Status   Specimen Description LEFT ANTECUBITAL  Final   Special Requests   Final    BOTTLES DRAWN AEROBIC AND ANAEROBIC Blood Culture adequate volume   Culture   Final    NO GROWTH 4 DAYS Performed at [facility], [address], [place], [place] [address]    Report Status PENDING  Incomplete  Blood Culture (routine x 2)     Status: None (Preliminary result)   Collection Time: 01/18/18 12:21 PM  Result Value Ref Range Status   Specimen Description BLOOD LEFT ARM  Final   Special Requests   Final    BOTTLES DRAWN AEROBIC AND ANAEROBIC Blood Culture adequate volume   Culture  Final    NO GROWTH 4 DAYS Performed at [facility], [address], [place], [place] [address]    Report Status PENDING  Incomplete  Urine culture     Status: Abnormal   Collection Time: 01/18/18 12:45 PM  Result Value Ref Range Status   Specimen Description   Final    URINE, CLEAN CATCH Performed at [facility], [address], [place], [place] [address]    Special Requests   Final    NONE Performed at [facility], [address] [place], [place] [address]    Culture MULTIPLE SPECIES PRESENT, SUGGEST RECOLLECTION (A)  Final   Report Status 01/19/2018 FINAL  Final  Respiratory Panel by PCR     Status: Abnormal   Collection Time: 01/18/18  4:02 PM  Result Value Ref Range Status   Adenovirus NOT DETECTED NOT DETECTED Final   Coronavirus 229E NOT DETECTED NOT DETECTED Final   Coronavirus HKU1 NOT DETECTED NOT DETECTED Final   Coronavirus NL63 NOT DETECTED NOT DETECTED Final   Coronavirus OC43 NOT DETECTED NOT DETECTED Final   Metapneumovirus NOT  DETECTED NOT DETECTED Final   Rhinovirus / Enterovirus DETECTED (A) NOT DETECTED Final   Influenza A NOT DETECTED NOT DETECTED Final   Influenza B NOT DETECTED NOT DETECTED Final   Parainfluenza Virus 1 NOT DETECTED NOT DETECTED Final   Parainfluenza Virus 2 NOT DETECTED NOT DETECTED Final   Parainfluenza Virus 3 NOT DETECTED NOT DETECTED Final   Parainfluenza Virus 4 NOT DETECTED NOT DETECTED Final   Respiratory Syncytial Virus NOT DETECTED NOT DETECTED Final   Bordetella pertussis NOT DETECTED NOT DETECTED Final   Chlamydophila pneumoniae NOT DETECTED NOT DETECTED Final   Mycoplasma pneumoniae NOT DETECTED NOT DETECTED Final    Comment: Performed at [facility], [place] [address] [place], [place] [address]  MRSA PCR Screening     Status: Abnormal   Collection Time: 01/18/18  4:02 PM  Result Value Ref Range Status   MRSA by PCR POSITIVE (A) NEGATIVE Final    Comment:        The GeneXpert MRSA Assay (FDA approved for NASAL specimens only), is one component of a comprehensive MRSA colonization surveillance program. It is not intended to diagnose MRSA infection nor to guide or monitor treatment for MRSA infections. RESULT CALLED TO, READ BACK BY AND VERIFIED WITH: EVANS,H. AT 2012 ON 01/18/2017 BY EVA Performed at [facility], [address], [place], [place] [address]       Radiology Studies: Ct Chest W Contrast  Addendum Date: 01/20/2018   ADDENDUM REPORT: 01/20/2018 16:48 ADDENDUM: The following addition is made to the IMPRESSION: 7. Nonspecific curvilinear hyperdense material in the periurethral region, new since 2009 CT. Findings could be due to prior procedure. If there is no history of a procedure to explain these findings, a urethral diverticulum with hemorrhagic/proteinaceous material or enhancing mass can not be excluded and further evaluation with a urethral protocol MRI pelvis without and with IV contrast could be considered. These addended results will be called to  the ordering clinician or representative by the Radiologist Assistant, and communication documented in the PACS or zVision Dashboard. Electronically Signed   By: Ilona Sorrel M.D.   On: 01/20/2018 16:48   Result Date: 01/20/2018 CLINICAL DATA:  Inpatient. Pancytopenia. Weight loss. Fever. Generalized weakness. Marked splenomegaly on ultrasound. 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 EXAM: CT CHEST, ABDOMEN, AND PELVIS WITH CONTRAST TECHNIQUE: Multidetector CT imaging of the chest, abdomen and pelvis was  performed following the standard protocol during bolus administration of intravenous contrast. CONTRAST:  63m ISOVUE-300 IOPAMIDOL (ISOVUE-300) INJECTION 61%, 1029mISOVUE-300 IOPAMIDOL (ISOVUE-300) INJECTION 61% COMPARISON:  01/19/2018 abdominal sonogram. 11/09/2007 CT abdomen/pelvis. FINDINGS: CT CHEST FINDINGS Cardiovascular: Normal heart size. Trace pericardial effusion/thickening. Three-vessel coronary atherosclerosis. Atherosclerotic nonaneurysmal thoracic aorta. Top-normal caliber main pulmonary artery (3.1 cm diameter). No central pulmonary emboli. Mediastinum/Nodes: Small bilateral thyroid nodules, largest 1.3 cm with coarse calcification in the left thyroid lobe. Unremarkable esophagus. No pathologically enlarged axillary, mediastinal or hilar lymph nodes. Lungs/Pleura: No pneumothorax. Small dependent bilateral pleural effusions. Mild compressive atelectasis in the dependent lower lobes bilaterally. No acute consolidative airspace disease, lung masses or significant pulmonary nodules. Musculoskeletal: Heterogeneous low-attenuation throughout the thoracic vertebral marrow without discrete osseous lesions. Mild thoracic spondylosis. CT ABDOMEN PELVIS FINDINGS Hepatobiliary: Top-normal liver size. No liver surface irregularity. Simple 0.9 cm lateral segment left liver lobe cyst. Otherwise no liver masses. Marked diffuse gallbladder wall thickening. No radiopaque cholelithiasis. No gallbladder distention. Mild  diffuse periportal edema. No biliary ductal dilatation. Pancreas: Normal, with no mass or duct dilation. Spleen: Marked splenomegaly, increased from 2009. Splenic dimensions 15.4 x 6.5 x 22.6 cm (volume = 1200 cm^3). No splenic masses. Adrenals/Urinary Tract: Normal adrenals. No hydronephrosis. Subcentimeter hypodense renal cortical lesion in the lower left kidney is too small to characterize and requires no follow-up. No additional renal lesions. Normal bladder. There is nonspecific curvilinear hyperdense material in the periurethral region (series 2/image 118), new since 2009 CT. Stomach/Bowel: Normal non-distended stomach. Normal caliber small bowel with no small bowel wall thickening. Normal appendix. Normal large bowel with no diverticulosis, large bowel wall thickening or pericolonic fat stranding. Oral contrast transits to the left colon. Vascular/Lymphatic: Atherosclerotic nonaneurysmal abdominal aorta. Patent portal, splenic, hepatic and renal veins. No pathologically enlarged lymph nodes in the abdomen or pelvis. Reproductive: Mildly enlarged heterogeneous anteverted uterus, similar to the 2009 CT. No adnexal masses. Other: No pneumoperitoneum, ascites or focal fluid collection. Musculoskeletal: Heterogeneous low-attenuation throughout the lumbar vertebral marrow without discrete osseous lesions. IMPRESSION: 1. Marked splenomegaly. Heterogeneous low-attenuation throughout the thoracolumbar vertebral marrow without discrete osseous lesions. These nonspecific findings raise concern for a lymphoproliferative disorder with infiltrative marrow process. 2. No lymphadenopathy in the chest, abdomen or pelvis. 3. Small dependent bilateral pleural effusions. 4. Prominent diffuse gallbladder wall thickening and mild diffuse periportal edema. These findings are most compatible with noninflammatory edema, probably due to the patient's hypoalbuminemia. 5. Three-vessel coronary atherosclerosis. 6.  Aortic Atherosclerosis  (ICD10-I70.0). Electronically Signed: By: JaIlona Sorrel.D. On: 01/20/2018 16:20   Ct Abdomen Pelvis W Contrast  Addendum Date: 01/20/2018   ADDENDUM REPORT: 01/20/2018 16:48 ADDENDUM: The following addition is made to the IMPRESSION: 7. Nonspecific curvilinear hyperdense material in the periurethral region, new since 2009 CT. Findings could be due to prior procedure. If there is no history of a procedure to explain these findings, a urethral diverticulum with hemorrhagic/proteinaceous material or enhancing mass can not be excluded and further evaluation with a urethral protocol MRI pelvis without and with IV contrast could be considered. These addended results will be called to the ordering clinician or representative by the Radiologist Assistant, and communication documented in the PACS or zVision Dashboard. Electronically Signed   By: JaIlona Sorrel.D.   On: 01/20/2018 16:48   Result Date: 01/20/2018 CLINICAL DATA:  Inpatient. Pancytopenia. Weight loss. Fever. Generalized weakness. Marked splenomegaly on ultrasound. 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 EXAM: CT CHEST, ABDOMEN, AND PELVIS WITH CONTRAST TECHNIQUE: Multidetector CT imaging of  the chest, abdomen and pelvis was performed following the standard protocol during bolus administration of intravenous contrast. CONTRAST:  20m ISOVUE-300 IOPAMIDOL (ISOVUE-300) INJECTION 61%, 1063mISOVUE-300 IOPAMIDOL (ISOVUE-300) INJECTION 61% COMPARISON:  01/19/2018 abdominal sonogram. 11/09/2007 CT abdomen/pelvis. FINDINGS: CT CHEST FINDINGS Cardiovascular: Normal heart size. Trace pericardial effusion/thickening. Three-vessel coronary atherosclerosis. Atherosclerotic nonaneurysmal thoracic aorta. Top-normal caliber main pulmonary artery (3.1 cm diameter). No central pulmonary emboli. Mediastinum/Nodes: Small bilateral thyroid nodules, largest 1.3 cm with coarse calcification in the left thyroid lobe. Unremarkable esophagus. No pathologically enlarged axillary,  mediastinal or hilar lymph nodes. Lungs/Pleura: No pneumothorax. Small dependent bilateral pleural effusions. Mild compressive atelectasis in the dependent lower lobes bilaterally. No acute consolidative airspace disease, lung masses or significant pulmonary nodules. Musculoskeletal: Heterogeneous low-attenuation throughout the thoracic vertebral marrow without discrete osseous lesions. Mild thoracic spondylosis. CT ABDOMEN PELVIS FINDINGS Hepatobiliary: Top-normal liver size. No liver surface irregularity. Simple 0.9 cm lateral segment left liver lobe cyst. Otherwise no liver masses. Marked diffuse gallbladder wall thickening. No radiopaque cholelithiasis. No gallbladder distention. Mild diffuse periportal edema. No biliary ductal dilatation. Pancreas: Normal, with no mass or duct dilation. Spleen: Marked splenomegaly, increased from 2009. Splenic dimensions 15.4 x 6.5 x 22.6 cm (volume = 1200 cm^3). No splenic masses. Adrenals/Urinary Tract: Normal adrenals. No hydronephrosis. Subcentimeter hypodense renal cortical lesion in the lower left kidney is too small to characterize and requires no follow-up. No additional renal lesions. Normal bladder. There is nonspecific curvilinear hyperdense material in the periurethral region (series 2/image 118), new since 2009 CT. Stomach/Bowel: Normal non-distended stomach. Normal caliber small bowel with no small bowel wall thickening. Normal appendix. Normal large bowel with no diverticulosis, large bowel wall thickening or pericolonic fat stranding. Oral contrast transits to the left colon. Vascular/Lymphatic: Atherosclerotic nonaneurysmal abdominal aorta. Patent portal, splenic, hepatic and renal veins. No pathologically enlarged lymph nodes in the abdomen or pelvis. Reproductive: Mildly enlarged heterogeneous anteverted uterus, similar to the 2009 CT. No adnexal masses. Other: No pneumoperitoneum, ascites or focal fluid collection. Musculoskeletal: Heterogeneous  low-attenuation throughout the lumbar vertebral marrow without discrete osseous lesions. IMPRESSION: 1. Marked splenomegaly. Heterogeneous low-attenuation throughout the thoracolumbar vertebral marrow without discrete osseous lesions. These nonspecific findings raise concern for a lymphoproliferative disorder with infiltrative marrow process. 2. No lymphadenopathy in the chest, abdomen or pelvis. 3. Small dependent bilateral pleural effusions. 4. Prominent diffuse gallbladder wall thickening and mild diffuse periportal edema. These findings are most compatible with noninflammatory edema, probably due to the patient's hypoalbuminemia. 5. Three-vessel coronary atherosclerosis. 6.  Aortic Atherosclerosis (ICD10-I70.0). Electronically Signed: By: JaIlona Sorrel.D. On: 01/20/2018 16:20    CoMarzetta BoardMD, PhD [facility]  Contact via  [email or web address]  [place]: [id number]: [phone]

## 2018-01-22 NOTE — Consult Note (Signed)
[facility] Oncology Progress Note  Name: Kristin Good      MRN: [id number]    Location: IC05/IC05-01  Date: 01/22/2018 Time:4:56 PM   Subjective: Interval History:Kristin Good is seen for follow-up today.  She is feeling more energetic today.  Did not have any fever in the last 2 days.  Denies any nausea, vomiting or diarrhea.  Appetite is good.  No headaches reported.  She is able to walk around the bed and take shower.  No abdominal pains.  She is anxious to know about the bone marrow results.  Objective: Vital signs in last 24 hours: Temp:  [98 F (36.7 C)-98.5 F (36.9 C)] 98.5 F (36.9 C) (01/09 1647) Pulse Rate:  [79-86] 86 (01/09 1647) Resp:  [16-20] 16 (01/09 1647) BP: (127)/(69-75) 127/69 (01/09 1647) SpO2:  [99 %-100 %] 100 % (01/09 1647)    Intake/Output from previous day: 01/08 0800 - 01/09 0759 In: 536.2 [I.V.:286.2] Out: -     Intake/Output this shift: Total I/O In: 1812.3 [I.V.:862.2; IV Piggyback:950.1] Out: -    PHYSICAL EXAM: BP 127/69 (BP Location: Right Arm)   Pulse 86   Temp 98.5 F (36.9 C) (Oral)   Resp 16   Ht 5\' 3"  (1.6 m)   Wt 126 lb 12.2 oz (57.5 kg)   SpO2 100%   BMI 22.46 kg/m  General appearance: alert, cooperative and appears stated age Lungs: clear to auscultation bilaterally Heart: regular rate and rhythm Abdomen: Soft, nontender with splenomegaly palpable. Extremities: 1+ edema bilaterally. Skin: Skin color, texture, turgor normal. No rashes or lesions Lymph nodes: Cervical, supraclavicular, and axillary nodes normal. Neurologic: Grossly normal   Studies/Results: Results for orders placed or performed during the hospital encounter of 01/18/18 (from the past 48 hour(s))  Glucose, capillary     Status: Abnormal   Collection Time: 01/20/18  9:15 PM  Result Value Ref Range   Glucose-Capillary 113 (H) 70 - 99 mg/dL  CBC with Differential/Platelet     Status: Abnormal   Collection Time: 01/21/18  4:24 AM  Result Value  Ref Range   WBC 0.6 (LL) 4.0 - 10.5 K/uL    Comment: REPEATED TO VERIFY WHITE COUNT CONFIRMED ON SMEAR CRITICAL VALUE NOTED.  VALUE IS CONSISTENT WITH PREVIOUSLY REPORTED AND CALLED VALUE. THIS CRITICAL RESULT HAS VERIFIED AND BEEN CALLED TO DANIELS,J BY SHERRI HUFFINES ON 01 08 2020 AT 0556, AND HAS BEEN READ BACK. CRTPRC    RBC 3.15 (L) 3.87 - 5.11 MIL/uL   Hemoglobin 9.3 (L) 12.0 - 15.0 g/dL    Comment: REPEATED TO VERIFY POST TRANSFUSION SPECIMEN    HCT 29.3 (L) 36.0 - 46.0 %   MCV 93.0 80.0 - 100.0 fL   MCH 29.5 26.0 - 34.0 pg   MCHC 31.7 30.0 - 36.0 g/dL   RDW 18.5 (H) 11.5 - 15.5 %   Platelets 26 (LL) 150 - 400 K/uL    Comment: PLATELET COUNT CONFIRMED BY SMEAR SPECIMEN CHECKED FOR CLOTS Immature Platelet Fraction may be clinically indicated, consider ordering this additional test [id number] CONSISTENT WITH PREVIOUS RESULT    nRBC 0.0 0.0 - 0.2 %   Neutrophils Relative % 25 %   Neutro Abs 0.2 (L) 1.7 - 7.7 K/uL   Lymphocytes Relative 68 %   Lymphs Abs 0.4 (L) 0.7 - 4.0 K/uL   Monocytes Relative 5 %   Monocytes Absolute 0.0 (L) 0.1 - 1.0 K/uL   Eosinophils Relative 0 %   Eosinophils Absolute 0.0 0.0 -  0.5 K/uL   Basophils Relative 0 %   Basophils Absolute 0.0 0.0 - 0.1 K/uL   Immature Granulocytes 2 %   Abs Immature Granulocytes 0.01 0.00 - 0.07 K/uL    Comment: Performed at [facility], [address], [place], [place] [address]  Procalcitonin     Status: None   Collection Time: 01/21/18  4:24 AM  Result Value Ref Range   Procalcitonin 2.01 ng/mL    Comment:        Interpretation: PCT > 2 ng/mL: Systemic infection (sepsis) is likely, unless other causes are known. (NOTE)       Sepsis PCT Algorithm           Lower Respiratory Tract                                      Infection PCT Algorithm    ----------------------------     ----------------------------         PCT < 0.25 ng/mL                PCT < 0.10 ng/mL         Strongly encourage             Strongly  discourage   discontinuation of antibiotics    initiation of antibiotics    ----------------------------     -----------------------------       PCT 0.25 - 0.50 ng/mL            PCT 0.10 - 0.25 ng/mL               OR       >80% decrease in PCT            Discourage initiation of                                            antibiotics      Encourage discontinuation           of antibiotics    ----------------------------     -----------------------------         PCT >= 0.50 ng/mL              PCT 0.26 - 0.50 ng/mL               AND       <80% decrease in PCT              Encourage initiation of                                             antibiotics       Encourage continuation           of antibiotics    ----------------------------     -----------------------------        PCT >= 0.50 ng/mL                  PCT > 0.50 ng/mL               AND         increase in PCT  Strongly encourage                                      initiation of antibiotics    Strongly encourage escalation           of antibiotics                                     -----------------------------                                           PCT <= 0.25 ng/mL                                                 OR                                        > 80% decrease in PCT                                     Discontinue / Do not initiate                                             antibiotics Performed at [facility], [address], [place], [place] [address]   Comprehensive metabolic panel     Status: Abnormal   Collection Time: 01/21/18  4:24 AM  Result Value Ref Range   Sodium 139 135 - 145 mmol/L   Potassium 4.0 3.5 - 5.1 mmol/L   Chloride 117 (H) 98 - 111 mmol/L   CO2 16 (L) 22 - 32 mmol/L   Glucose, Bld 154 (H) 70 - 99 mg/dL   BUN 18 6 - 20 mg/dL   Creatinine, Ser 0.87 0.44 - 1.00 mg/dL   Calcium 7.6 (L) 8.9 - 10.3 mg/dL   Total Protein 4.6 (L) 6.5 - 8.1 g/dL   Albumin 2.6 (L) 3.5 - 5.0  g/dL   AST 50 (H) 15 - 41 U/L   ALT 138 (H) 0 - 44 U/L   Alkaline Phosphatase 479 (H) 38 - 126 U/L   Total Bilirubin 2.3 (H) 0.3 - 1.2 mg/dL   GFR calc non Af Amer >60 >60 mL/min   GFR calc Af Amer >60 >60 mL/min   Anion gap 6 5 - 15    Comment: Performed at [facility], [address] [place], [place] [address]  Transfusion reaction     Status: None   Collection Time: 01/21/18  4:24 AM  Result Value Ref Range   Post RXN DAT IgG      NEG Performed at [facility], [address], [place], [place] [address]    DAT C3 NEG    Path interp tx rxn      No laboratory evidence of hemolytic transfusion reaction.  Additional transfusions  approved per Claudette Laws, MD. Performed at [facility], [place] [address] [place], [place] [address]   Urinalysis, Routine w reflex microscopic     Status: Abnormal   Collection Time: 01/21/18  4:30 AM  Result Value Ref Range   Color, Urine YELLOW YELLOW   APPearance CLEAR CLEAR   Specific Gravity, Urine 1.020 1.005 - 1.030   pH 6.0 5.0 - 8.0   Glucose, UA NEGATIVE NEGATIVE mg/dL   Hgb urine dipstick NEGATIVE NEGATIVE   Bilirubin Urine NEGATIVE NEGATIVE   Ketones, ur NEGATIVE NEGATIVE mg/dL   Protein, ur 30 (A) NEGATIVE mg/dL   Nitrite NEGATIVE NEGATIVE   Leukocytes, UA NEGATIVE NEGATIVE   RBC / HPF 0-5 0 - 5 RBC/hpf   WBC, UA 0-5 0 - 5 WBC/hpf   Bacteria, UA NONE SEEN NONE SEEN   Squamous Epithelial / LPF 0-5 0 - 5    Comment: Performed at [facility], [address] [place], [place] [address]  Glucose, capillary     Status: Abnormal   Collection Time: 01/21/18  7:30 AM  Result Value Ref Range   Glucose-Capillary 125 (H) 70 - 99 mg/dL  Glucose, capillary     Status: Abnormal   Collection Time: 01/21/18 11:15 AM  Result Value Ref Range   Glucose-Capillary 117 (H) 70 - 99 mg/dL  Glucose, capillary     Status: Abnormal   Collection Time: 01/21/18  4:06 PM  Result Value Ref Range   Glucose-Capillary 142 (H) 70 - 99 mg/dL  Glucose,  capillary     Status: Abnormal   Collection Time: 01/21/18  9:03 PM  Result Value Ref Range   Glucose-Capillary 169 (H) 70 - 99 mg/dL  CBC with Differential/Platelet     Status: Abnormal   Collection Time: 01/22/18  4:28 AM  Result Value Ref Range   WBC 0.8 (LL) 4.0 - 10.5 K/uL    Comment: CRITICAL VALUE NOTED.  VALUE IS CONSISTENT WITH PREVIOUSLY REPORTED AND CALLED VALUE.   RBC 2.60 (L) 3.87 - 5.11 MIL/uL   Hemoglobin 7.6 (L) 12.0 - 15.0 g/dL   HCT 24.5 (L) 36.0 - 46.0 %   MCV 94.2 80.0 - 100.0 fL   MCH 29.2 26.0 - 34.0 pg   MCHC 31.0 30.0 - 36.0 g/dL   RDW 18.9 (H) 11.5 - 15.5 %   Platelets 23 (LL) 150 - 400 K/uL    Comment: Immature Platelet Fraction may be clinically indicated, consider ordering this additional test [id number] CRITICAL VALUE NOTED.  VALUE IS CONSISTENT WITH PREVIOUSLY REPORTED AND CALLED VALUE.    nRBC 0.0 0.0 - 0.2 %   Neutrophils Relative % 23 %   Neutro Abs 0.2 (L) 1.7 - 7.7 K/uL   Lymphocytes Relative 69 %   Lymphs Abs 0.5 (L) 0.7 - 4.0 K/uL   Monocytes Relative 8 %   Monocytes Absolute 0.1 0.1 - 1.0 K/uL   Eosinophils Relative 0 %   Eosinophils Absolute 0.0 0.0 - 0.5 K/uL   Basophils Relative 0 %   Basophils Absolute 0.0 0.0 - 0.1 K/uL   Immature Granulocytes 0 %   Abs Immature Granulocytes 0.00 0.00 - 0.07 K/uL    Comment: Performed at [facility], [address], [place], [place] [address]  Comprehensive metabolic panel     Status: Abnormal   Collection Time: 01/22/18  4:28 AM  Result Value Ref Range   Sodium 143 135 - 145 mmol/L   Potassium 3.7 3.5 - 5.1 mmol/L  Chloride 121 (H) 98 - 111 mmol/L   CO2 18 (L) 22 - 32 mmol/L   Glucose, Bld 122 (H) 70 - 99 mg/dL   BUN 17 6 - 20 mg/dL   Creatinine, Ser 0.77 0.44 - 1.00 mg/dL   Calcium 7.5 (L) 8.9 - 10.3 mg/dL   Total Protein 4.1 (L) 6.5 - 8.1 g/dL   Albumin 2.3 (L) 3.5 - 5.0 g/dL   AST 35 15 - 41 U/L   ALT 93 (H) 0 - 44 U/L   Alkaline Phosphatase 410 (H) 38 - 126 U/L   Total Bilirubin 1.3  (H) 0.3 - 1.2 mg/dL   GFR calc non Af Amer >60 >60 mL/min   GFR calc Af Amer >60 >60 mL/min   Anion gap 4 (L) 5 - 15    Comment: Performed at [facility], [address], [place], [place] [address]  Protime-INR     Status: None   Collection Time: 01/22/18  4:28 AM  Result Value Ref Range   Prothrombin Time 14.0 11.4 - 15.2 seconds   INR 1.09     Comment: Performed at [facility], [address] [place], [place] [address]  Triglycerides     Status: Abnormal   Collection Time: 01/22/18  4:28 AM  Result Value Ref Range   Triglycerides 225 (H) <150 mg/dL    Comment: Performed at [facility], [address] [place], [place] [address]  Fibrinogen     Status: None   Collection Time: 01/22/18  4:28 AM  Result Value Ref Range   Fibrinogen 255 210 - 475 mg/dL    Comment: Performed at [facility], [address] [place], [place] [address]  APTT     Status: Abnormal   Collection Time: 01/22/18  4:28 AM  Result Value Ref Range   aPTT 38 (H) 24 - 36 seconds    Comment:        IF BASELINE aPTT IS ELEVATED, SUGGEST PATIENT RISK ASSESSMENT BE USED TO DETERMINE APPROPRIATE ANTICOAGULANT THERAPY. Performed at [facility], [address], [place], [place] [address]   Lactate dehydrogenase     Status: Abnormal   Collection Time: 01/22/18  4:28 AM  Result Value Ref Range   LDH 279 (H) 98 - 192 U/L    Comment: Performed at [facility], [address] [place], [place] [address]  Ferritin     Status: Abnormal   Collection Time: 01/22/18  4:28 AM  Result Value Ref Range   Ferritin 1,362 (H) 11 - 307 ng/mL    Comment: Performed at [facility], [address], [place], [place] [address]  Glucose, capillary     Status: Abnormal   Collection Time: 01/22/18  7:35 AM  Result Value Ref Range   Glucose-Capillary 100 (H) 70 - 99 mg/dL  Glucose, capillary     Status: Abnormal   Collection Time: 01/22/18 11:20 AM  Result Value Ref Range   Glucose-Capillary 147 (H) 70 - 99 mg/dL  Glucose, capillary      Status: Abnormal   Collection Time: 01/22/18  4:44 PM  Result Value Ref Range   Glucose-Capillary 191 (H) 70 - 99 mg/dL   No results found.   MEDICATIONS: I have reviewed the patient's current medications.     Assessment/Plan:  1.  Pancytopenia: - There is no improvement in her pancytopenia.  Hemoglobin actually decreased slightly today. - CT CAP showed splenomegaly. -I could not get in touch with the pathologist (it is highly unlikely that any  results are available today as the marrow was sent for IHC stains).  I will call the pathologist in the morning. 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  She does not have all the findings.  The findings highly suspicious or presentation with fever, splenomegaly, cytopenias.  Ferritin is elevated at 1362.  Triglycerides are only mildly elevated.  Fibrinogen is normal. -I plan to send soluble CD25 which is done in the [facility] lab. - We will look for hemophagocytosis in the bone marrow. - If it is confirmed on the bone marrow, we will consider immunosuppression with etoposide and dexamethasone.  2.  Fevers: -She has been afebrile for the last at least 2 days. -She is currently on vancomycin, meropenem and doxycycline.  3.  Infectious work-up: - All the tickborne related work-up was negative.  EBV DNA PCR was positive. - EBV antibody work-up was sent. -Rarely infections can stimulate hemophagocytosis.  All questions were answered. The patient knows to call the clinic with any problems, questions or concerns. We can certainly see the patient much sooner if necessary.     Derek Jack

## 2018-01-23 ENCOUNTER — Encounter (HOSPITAL_COMMUNITY): Payer: Self-pay | Admitting: Radiology

## 2018-01-23 ENCOUNTER — Inpatient Hospital Stay (HOSPITAL_COMMUNITY): Payer: Commercial Managed Care - PPO

## 2018-01-23 LAB — GLUCOSE, CAPILLARY
Glucose-Capillary: 96 mg/dL (ref 70–99)
Glucose-Capillary: 148 mg/dL — ABNORMAL HIGH (ref 70–99)
Glucose-Capillary: 121 mg/dL — ABNORMAL HIGH (ref 70–99)
Glucose-Capillary: 148 mg/dL — ABNORMAL HIGH (ref 70–99)

## 2018-01-23 LAB — CBC WITH DIFFERENTIAL/PLATELET
Abs Immature Granulocytes: 0.01 10*3/uL (ref 0.00–0.07)
BASOS ABS: 0 10*3/uL (ref 0.0–0.1)
Basophils Relative: 1 %
Eosinophils Absolute: 0 10*3/uL (ref 0.0–0.5)
Eosinophils Relative: 0 %
HCT: 25.1 % — ABNORMAL LOW (ref 36.0–46.0)
Hemoglobin: 7.8 g/dL — ABNORMAL LOW (ref 12.0–15.0)
Immature Granulocytes: 1 %
LYMPHS PCT: 70 %
Lymphs Abs: 0.6 10*3/uL — ABNORMAL LOW (ref 0.7–4.0)
MCH: 29.8 pg (ref 26.0–34.0)
MCHC: 31.1 g/dL (ref 30.0–36.0)
MCV: 95.8 fL (ref 80.0–100.0)
Monocytes Absolute: 0.1 10*3/uL (ref 0.1–1.0)
Monocytes Relative: 8 %
NEUTROS ABS: 0.2 10*3/uL — AB (ref 1.7–7.7)
Neutrophils Relative %: 20 %
PLATELETS: 26 10*3/uL — AB (ref 150–400)
RBC: 2.62 MIL/uL — ABNORMAL LOW (ref 3.87–5.11)
RDW: 18.4 % — ABNORMAL HIGH (ref 11.5–15.5)
WBC: 0.9 10*3/uL — CL (ref 4.0–10.5)
nRBC: 0 % (ref 0.0–0.2)

## 2018-01-23 LAB — COMPREHENSIVE METABOLIC PANEL
ALT: 74 U/L — ABNORMAL HIGH (ref 0–44)
AST: 38 U/L (ref 15–41)
Albumin: 2.4 g/dL — ABNORMAL LOW (ref 3.5–5.0)
Alkaline Phosphatase: 388 U/L — ABNORMAL HIGH (ref 38–126)
BUN: 16 mg/dL (ref 6–20)
CO2: 20 mmol/L — ABNORMAL LOW (ref 22–32)
Calcium: 7.7 mg/dL — ABNORMAL LOW (ref 8.9–10.3)
Chloride: 119 mmol/L — ABNORMAL HIGH (ref 98–111)
Creatinine, Ser: 0.68 mg/dL (ref 0.44–1.00)
GFR calc non Af Amer: >60 mL/min (ref >60)
Glucose, Bld: 100 mg/dL — ABNORMAL HIGH (ref 70–99)
Potassium: 3.5 mmol/L (ref 3.5–5.1)
Sodium: 141 mmol/L (ref 135–145)
Total Bilirubin: 1.2 mg/dL (ref 0.3–1.2)
Total Protein: 4.3 g/dL — ABNORMAL LOW (ref 6.5–8.1)

## 2018-01-23 LAB — CULTURE, BLOOD (ROUTINE X 2)
Culture: NO GROWTH
Culture: NO GROWTH
SPECIAL REQUESTS: ADEQUATE
Special Requests: ADEQUATE

## 2018-01-23 LAB — EPSTEIN-BARR VIRUS VCA ANTIBODY PANEL
EBV Early Antigen Ab, IgG: <9 U/mL (ref 0.0–8.9)
EBV NA IgG: <18 U/mL (ref 0.0–17.9)
EBV VCA IgG: <18 U/mL (ref 0.0–17.9)
EBV VCA IgM: <36 U/mL (ref 0.0–35.9)

## 2018-01-23 LAB — ANTI-DNA ANTIBODY, DOUBLE-STRANDED: ds DNA Ab: <1 IU/mL (ref 0–9)

## 2018-01-23 LAB — ANTINUCLEAR ANTIBODIES, IFA: ANA Ab, IFA: NEGATIVE

## 2018-01-23 MED ORDER — SODIUM CHLORIDE 0.9% FLUSH
10.0000 mL | Freq: Two times a day (BID) | INTRAVENOUS | Status: DC
  Administered 2018-01-23 – 2018-01-24 (×2): 10 mL

## 2018-01-23 MED ORDER — SODIUM CHLORIDE 0.9% FLUSH: 10.0000 mL | INTRAVENOUS | Status: DC | PRN

## 2018-01-23 NOTE — Progress Notes (Signed)
Peripherally Inserted Central Catheter/Midline Placement  The IV Nurse has discussed with the patient and/or persons authorized to consent for the patient, the purpose of this procedure and the potential benefits and risks involved with this procedure.  The benefits include less needle sticks, lab draws from the catheter, and the patient may be discharged home with the catheter. Risks include, but not limited to, infection, bleeding, blood clot (thrombus formation), and puncture of an artery; nerve damage and irregular heartbeat and possibility to perform a PICC exchange if needed/ordered by physician.  Alternatives to this procedure were also discussed.  B[employer] Power PICC patient education guide, fact sheet on infection prevention and patient information card has been provided to patient /or left at bedside.    PICC/Midline Placement Documentation  PICC Single Lumen 93/79/02 PICC Right Basilic 38 cm 0 cm (Active)  Indication for Insertion or Continuance of Line Home intravenous therapies (PICC only) 01/23/2018  8:13 PM  Exposed Catheter (cm) 0 cm 01/23/2018  8:13 PM  Site Assessment Clean;Dry;Intact 01/23/2018  8:13 PM  Line Status Flushed;Saline locked;Blood return noted 01/23/2018  8:13 PM  Dressing Type Transparent 01/23/2018  8:13 PM  Dressing Status Clean;Dry;Intact;Antimicrobial disc in place 01/23/2018  8:13 PM  Dressing Change Due 01/30/18 01/23/2018  8:13 PM       Gordan Payment 01/23/2018, 8:14 PM

## 2018-01-23 NOTE — Consult Note (Signed)
[facility] Progress Note  Name: Kristin Good      MRN: [id number]    Location: A304/A304-01  Date: 01/23/2018 Time:4:33 PM   Subjective: Interval History:Kristin Good is lying in her bed.  Since she was transferred to third floor.  She says that she has been feeling well.  Her temperature went up to 100 degrees today.  Reportedly her antibiotics were being tapered off.  She does not report any chills.  She is eating well and denies any nausea, vomiting or diarrhea.  No abdominal pains were reported.  Objective: Vital signs in last 24 hours: Temp:  [98.5 F (36.9 C)-100 F (37.8 C)] 100 F (37.8 C) (01/10 1356) Pulse Rate:  [84-91] 88 (01/10 1356) Resp:  [16-20] 16 (01/10 1356) BP: (127-135)/(69-76) 130/72 (01/10 1356) SpO2:  [98 %-100 %] 100 % (01/10 1356) Weight:  [138 lb 14.2 oz (63 kg)] 138 lb 14.2 oz (63 kg) (01/09 1803)    Intake/Output from previous day: 01/09 0800 - 01/10 0759 In: 2453.6 [P.O.:360; I.V.:893.6] Out: -     Intake/Output this shift: Total I/O In: 480 [P.O.:480] Out: -    PHYSICAL EXAM: BP 130/72   Pulse 88   Temp 100 F (37.8 C) (Oral)   Resp 16   Ht _0  (1.6 m)   Wt 138 lb 14.2 oz (63 kg)   SpO2 100%   BMI 24.60 kg/m  General appearance: alert, cooperative and appears stated age Lungs: clear to auscultation bilaterally Heart: regular rate and rhythm Abdomen: Soft, nontender with splenomegaly Skin: No rashes. Neurologic: Grossly normal   Studies/Results: Results for orders placed or performed during the hospital encounter of 01/18/18 (from the past 48 hour(s))  Glucose, capillary     Status: Abnormal   Collection Time: 01/21/18  9:03 PM  Result Value Ref Range   Glucose-Capillary 169 (H) 70 - 99 mg/dL  CBC with Differential/Platelet     Status: Abnormal   Collection Time: 01/22/18  4:28 AM  Result Value Ref Range   WBC 0.8 (LL) 4.0 - 10.5 K/uL    Comment: CRITICAL VALUE NOTED.  VALUE IS CONSISTENT WITH PREVIOUSLY  REPORTED AND CALLED VALUE.   RBC 2.60 (L) 3.87 - 5.11 MIL/uL   Hemoglobin 7.6 (L) 12.0 - 15.0 g/dL   HCT 24.5 (L) 36.0 - 46.0 %   MCV 94.2 80.0 - 100.0 fL   MCH 29.2 26.0 - 34.0 pg   MCHC 31.0 30.0 - 36.0 g/dL   RDW 18.9 (H) 11.5 - 15.5 %   Platelets 23 (LL) 150 - 400 K/uL    Comment: Immature Platelet Fraction may be clinically indicated, consider ordering this additional test [id number] CRITICAL VALUE NOTED.  VALUE IS CONSISTENT WITH PREVIOUSLY REPORTED AND CALLED VALUE.    nRBC 0.0 0.0 - 0.2 %   Neutrophils Relative % 23 %   Neutro Abs 0.2 (L) 1.7 - 7.7 K/uL   Lymphocytes Relative 69 %   Lymphs Abs 0.5 (L) 0.7 - 4.0 K/uL   Monocytes Relative 8 %   Monocytes Absolute 0.1 0.1 - 1.0 K/uL   Eosinophils Relative 0 %   Eosinophils Absolute 0.0 0.0 - 0.5 K/uL   Basophils Relative 0 %   Basophils Absolute 0.0 0.0 - 0.1 K/uL   Immature Granulocytes 0 %   Abs Immature Granulocytes 0.00 0.00 - 0.07 K/uL    Comment: Performed at [facility], [address], [place],  [address]  Comprehensive metabolic panel  Status: Abnormal   Collection Time: 01/22/18  4:28 AM  Result Value Ref Range   Sodium 143 135 - 145 mmol/L   Potassium 3.7 3.5 - 5.1 mmol/L   Chloride 121 (H) 98 - 111 mmol/L   CO2 18 (L) 22 - 32 mmol/L   Glucose, Bld 122 (H) 70 - 99 mg/dL   BUN 17 6 - 20 mg/dL   Creatinine, Ser 0.77 0.44 - 1.00 mg/dL   Calcium 7.5 (L) 8.9 - 10.3 mg/dL   Total Protein 4.1 (L) 6.5 - 8.1 g/dL   Albumin 2.3 (L) 3.5 - 5.0 g/dL   AST 35 15 - 41 U/L   ALT 93 (H) 0 - 44 U/L   Alkaline Phosphatase 410 (H) 38 - 126 U/L   Total Bilirubin 1.3 (H) 0.3 - 1.2 mg/dL   GFR calc non Af Amer >60 >60 mL/min   GFR calc Af Amer >60 >60 mL/min   Anion gap 4 (L) 5 - 15    Comment: Performed at [facility], [address], [place], [place] [address]  Protime-INR     Status: None   Collection Time: 01/22/18  4:28 AM  Result Value Ref Range   Prothrombin Time 14.0 11.4 - 15.2 seconds   INR 1.09      Comment: Performed at [facility], [address] [place], [place] [address]  Triglycerides     Status: Abnormal   Collection Time: 01/22/18  4:28 AM  Result Value Ref Range   Triglycerides 225 (H) <150 mg/dL    Comment: Performed at [facility], [address], [place], [place] [address]  Fibrinogen     Status: None   Collection Time: 01/22/18  4:28 AM  Result Value Ref Range   Fibrinogen 255 210 - 475 mg/dL    Comment: Performed at [facility], [address] [place], [place] [address]  APTT     Status: Abnormal   Collection Time: 01/22/18  4:28 AM  Result Value Ref Range   aPTT 38 (H) 24 - 36 seconds    Comment:        IF BASELINE aPTT IS ELEVATED, SUGGEST PATIENT RISK ASSESSMENT BE USED TO DETERMINE APPROPRIATE ANTICOAGULANT THERAPY. Performed at [facility], [address], [place], [place] [address]   Lactate dehydrogenase     Status: Abnormal   Collection Time: 01/22/18  4:28 AM  Result Value Ref Range   LDH 279 (H) 98 - 192 U/L    Comment: Performed at [facility], [address] [place], [place] [address]  Antinuclear Antibodies, IFA     Status: None   Collection Time: 01/22/18  4:28 AM  Result Value Ref Range   ANA Ab, IFA Negative     Comment: (NOTE)                                     Negative   <1:80                                     Borderline  1:80                                     Positive   >1:80 Performed At: [facility],  [place] [id number] Rush Farmer MD [id number]   Anti-DNA antibody, double-stranded     Status: None   Collection Time: 01/22/18  4:28 AM  Result Value Ref Range   ds DNA Ab <1 0 - 9 IU/mL    Comment: (NOTE)                                   Negative      <5                                   Equivocal  5 - 9                                   Positive      >9 Performed At: [facility] [place], [place] [id number] Rush Farmer MD OM:[id number]   Ferritin     Status:  Abnormal   Collection Time: 01/22/18  4:28 AM  Result Value Ref Range   Ferritin 1,362 (H) 11 - 307 ng/mL    Comment: Performed at [facility], [address] [place], [place] [address]  Glucose, capillary     Status: Abnormal   Collection Time: 01/22/18  7:35 AM  Result Value Ref Range   Glucose-Capillary 100 (H) 70 - 99 mg/dL  Glucose, capillary     Status: Abnormal   Collection Time: 01/22/18 11:20 AM  Result Value Ref Range   Glucose-Capillary 147 (H) 70 - 99 mg/dL  Glucose, capillary     Status: Abnormal   Collection Time: 01/22/18  4:44 PM  Result Value Ref Range   Glucose-Capillary 191 (H) 70 - 99 mg/dL  Glucose, capillary     Status: Abnormal   Collection Time: 01/22/18 10:31 PM  Result Value Ref Range   Glucose-Capillary 136 (H) 70 - 99 mg/dL  CBC with Differential/Platelet     Status: Abnormal   Collection Time: 01/23/18  5:57 AM  Result Value Ref Range   WBC 0.9 (LL) 4.0 - 10.5 K/uL    Comment: This critical result has verified and been called to [place] S. by Levan Hurst on 01 10 2020 at 0800, and has been read back. CRITICAL RESULT VERIFIED   RBC 2.62 (L) 3.87 - 5.11 MIL/uL   Hemoglobin 7.8 (L) 12.0 - 15.0 g/dL   HCT 25.1 (L) 36.0 - 46.0 %   MCV 95.8 80.0 - 100.0 fL   MCH 29.8 26.0 - 34.0 pg   MCHC 31.1 30.0 - 36.0 g/dL   RDW 18.4 (H) 11.5 - 15.5 %   Platelets 26 (LL) 150 - 400 K/uL    Comment: Immature Platelet Fraction may be clinically indicated, consider ordering this additional test [id number] THIS CRITICAL RESULT HAS VERIFIED AND BEEN CALLED TO [place] S. BY LATISHA HENDERSON ON 01 10 2020 AT 0800, AND HAS BEEN READ BACK. CRITICAL RESULT VERIFIED    nRBC 0.0 0.0 - 0.2 %   Neutrophils Relative % 20 %   Neutro Abs 0.2 (L) 1.7 - 7.7 K/uL   Lymphocytes Relative 70 %   Lymphs Abs 0.6 (L) 0.7 - 4.0 K/uL   Monocytes Relative 8 %   Monocytes Absolute 0.1 0.1 - 1.0 K/uL   Eosinophils Relative 0 %   Eosinophils Absolute 0.0 0.0 - 0.5 K/uL  Basophils  Relative 1 %   Basophils Absolute 0.0 0.0 - 0.1 K/uL   Immature Granulocytes 1 %   Abs Immature Granulocytes 0.01 0.00 - 0.07 K/uL   Reactive, Benign Lymphocytes PRESENT     Comment: Performed at [facility], [address] [place], [place] [address]  Comprehensive metabolic panel     Status: Abnormal   Collection Time: 01/23/18  5:57 AM  Result Value Ref Range   Sodium 141 135 - 145 mmol/L   Potassium 3.5 3.5 - 5.1 mmol/L   Chloride 119 (H) 98 - 111 mmol/L   CO2 20 (L) 22 - 32 mmol/L   Glucose, Bld 100 (H) 70 - 99 mg/dL   BUN 16 6 - 20 mg/dL   Creatinine, Ser 0.68 0.44 - 1.00 mg/dL   Calcium 7.7 (L) 8.9 - 10.3 mg/dL   Total Protein 4.3 (L) 6.5 - 8.1 g/dL   Albumin 2.4 (L) 3.5 - 5.0 g/dL   AST 38 15 - 41 U/L   ALT 74 (H) 0 - 44 U/L   Alkaline Phosphatase 388 (H) 38 - 126 U/L   Total Bilirubin 1.2 0.3 - 1.2 mg/dL   GFR calc non Af Amer >60 >60 mL/min   GFR calc Af Amer >60 >60 mL/min    Comment: Performed at [facility], [address], [place], [place] [address]  Glucose, capillary     Status: None   Collection Time: 01/23/18  7:31 AM  Result Value Ref Range   Glucose-Capillary 96 70 - 99 mg/dL  Glucose, capillary     Status: Abnormal   Collection Time: 01/23/18 11:42 AM  Result Value Ref Range   Glucose-Capillary 148 (H) 70 - 99 mg/dL  Glucose, capillary     Status: Abnormal   Collection Time: 01/23/18  4:26 PM  Result Value Ref Range   Glucose-Capillary 121 (H) 70 - 99 mg/dL   No results found.   MEDICATIONS: I have reviewed the patient's current medications.     Assessment/Plan:  1.  Severe pancytopenia: - Presentation with severe pancytopenia. -Bone marrow biopsy on 01/21/2018.  Final result is pending. -I have talked to the pathologist Dr. Melina Copa.  Preliminary report is that her bone marrow is infiltrated with 95% lymphocytes.  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 -Additional stains including CD30 and CD56 are ordered.  Final results will  be back and final report will be signed off on Monday. -I have discussed the findings of the bone marrow with the patient in detail. - She will require an echocardiogram in preparation for chemotherapy to evaluate baseline ejection fraction. - She will also require a venous access.  I would be reluctant to place a port at this time given her severe thrombocytopenia. - We will order a PICC line placement. -I have discussed this with the patient and Dr. Cruzita Lederer. -Once the final diagnosis is back, I will discuss the details of chemotherapy regimen and its administration.   2.  Fevers: - Antibiotics are being de-escalated.  Doxycycline and vancomycin were discontinued. -She will complete meropenem by tomorrow. -She had very mild fever today 200 degrees.  This could be from lymphoma also.  All questions were answered. The patient knows to call the clinic with any problems, questions or concerns. We can certainly see the patient much sooner if necessary.     Derek Jack

## 2018-01-23 NOTE — Progress Notes (Signed)
PROGRESS NOTE  Kristin Good:[id number] DOB: May 03, 1960 DOA: 01/18/2018 PCP: Marinda Elk, MD   LOS: 5 days   Brief Narrative / Interim history: 58 y.o.femalewith medical history ofsome type of hematologic malignancy, diabetes mellitus, hypertension, hypothyroidism, melanoma presenting with 1 month history of fevers, chills, night sweats, and generalized weakness. The patient states that she went on vacation to [facility] approximately 2 weeks prior to Christmas. She went to the emergency department in [place] with similar symptoms. She was told that she had a viral infection and discharged in stable condition. The patient subsequently came home back to [place] on the week of Christmas. She went to see her primary care provider with a similar symptoms. Once again, the patient was told that she has some type of viral infection. However, the patient's generalized weakness and fevers persisted. Today the patient was going to work and getting out of her car when she had a near syncopal episode secondary to dizziness. She also had a temperature of 102.9 F. As result, the patient drove herself to the emergency department for further evaluation. The patient states that she has had intermittent epistaxis for the better part of a month that has been self-limited. She denies any hemoptysis, hematemesis, hematochezia, melena. She denies any headache, chest pain, nausea, vomiting, diarrhea, abdominal pain, dysuria, hematuria, neck pain. She has had dyspnea on exertion for the better part of the month. The patient denies starting any new medications. She does not take any over-the-counter medications.  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 The patient was treated with chlorambucil, prednisone, and radiation therapy. She has been in remission since her teenage years. She  states that during her multiple visits to the emergency department and PCP, blood work was performed and she was never told of any concerns  In the emergency department, the patient was febrile up to 102.6 F with tachycardia and soft blood pressure with systolic blood pressure in the 80s. Oxygen saturation was 100% on room air. The patient was started vancomycin and aztreonam and IV fluids.Hematology/oncology was consulted to assist with management   Subjective: -Feeling overall much improved, no shortness of breath, no chest pain, no palpitations.  No fever or chills.  Assessment & Plan: Active Problems:   Sepsis due to undetermined organism ([place])   Sepsis ([place])   Pancytopenia ([place])   Essential hypertension   Transaminasemia   Protein-calorie malnutrition, severe   Principal Problem SIRS -Source unclear,work-up in progress but negative so far, influenza was negative, EBV DNA quantitative PCR positive, discussed with ID this unlikely represents an acute infection but recommended to check EBV VCA antibody panel, pending, CMV, HIV, hep B, hep C all negative -Was positive on admission for rhinovirus on viral respiratory panel, supportive treatment.  Fever has resolved -Patient was initially started on broad-spectrum antibiotics of vancomycin, meropenem, doxycycline, has received already 6 days.  His vancomycin and doxycycline were discontinued on 1/9, keep on meropenem alone for 1 additional days to complete a 7-day course -All the cultures remain negative, tickborne illness serologies are negative as well.  Pancytopenia -The case was discussed with hematology, Dr. 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  Transaminasemia -Abdominal ultrasound--marked splenomegaly; diffuse echogenicity of portal triads suggest liver inflam; diffuse GB wall thickening -Viral serologies negative as above, her LFTs are improving and almost normalized.  Diabetes mellitus type 2 -Hemoglobin A1c--5.7, CBGs have been well controlled, continue current regimen  Hypothyroidism -Continue levothyroxine  Essential hypertension -Holding Maxide, losartan secondary to hypotension  Hyperlipidemia -Hold statin due to elevated LFTs   Scheduled Meds: . sodium chloride   Intravenous Once  . Chlorhexidine Gluconate Cloth  6 each Topical Q0600  . insulin aspart  0-5 Units Subcutaneous QHS  . insulin aspart  0-9 Units Subcutaneous TID WC  . insulin glargine  10 Units Subcutaneous QHS  . levothyroxine  50 mcg Oral Q0600  . multivitamin with minerals  1 tablet Oral Daily  . mupirocin ointment  1 application Nasal BID  . polyethylene glycol  17 g Oral Daily  . senna  2 tablet Oral Daily  . vitamin B-12  500 mcg Oral Daily   Continuous Infusions: . meropenem (MERREM) IV 1 g (01/23/18 0545)   PRN Meds:.acetaminophen **OR** acetaminophen, ondansetron **OR** ondansetron (ZOFRAN) IV  DVT prophylaxis: SCDs Code Status: Full code Family Communication: Husband present at bedside Disposition Plan: Home when cleared by hematology  Consultants:   Hematology  Procedures:   Bone marrow biopsy 1/8  Antimicrobials:  Vanc, doxycycline 1/6 << 1/9  Meropenem  Objective: Vitals:   01/22/18 1800 01/22/18 1803 01/22/18 2113 01/23/18 0510  BP: 135/76  128/70 133/72  Pulse: 91  84 90  Resp: '19  20 20  '$ Temp: 99.2 F (37.3 C)  99.2 F (37.3 C) 99.1 F (37.3 C)  TempSrc: Oral  Oral Oral  SpO2: 100%  100% 98%  Weight:  63 kg    Height:  '5\' 3"'$  (1.6 m)      Intake/Output Summary (Last 24 hours) at  01/23/2018 1118 Last data filed at 01/23/2018 0905 Gross per 24 hour  Intake 1175.69 ml  Output -  Net 1175.69 ml   Filed Weights   01/18/18 1603 01/20/18 0500 01/22/18 1803  Weight: 57.5 kg 57.5 kg 63 kg    Examination:  Constitutional: No distress, pale Eyes: No icterus seen ENMT: mmm Neck: normal, supple Respiratory: Lungs are clear to auscultation, no wheezing or crackles heard.  Normal respiratory effort with good excursion Cardiovascular: Regular rate and rhythm, no murmurs appreciated.  Trace lower extremity edema Abdomen: Soft, nontender, nondistended, positive bowel sounds, no guarding or rebound Musculoskeletal: no clubbing / cyanosis.  Skin: No rashes appreciated Neurologic: Equal strength, no focal deficits Psychiatric: Normal judgment and insight. Alert and oriented x 3. Normal mood.    Data Reviewed: I have independently reviewed following labs and imaging studies   CBC: Recent Labs  Lab 01/19/18 0420 01/19/18 0747 01/20/18 0410 01/21/18 0424 01/22/18 0428 01/23/18 0557  WBC 0.6*  --  0.5* 0.6* 0.8* 0.9*  NEUTROABS 0.2*  --  0.2* 0.2* 0.2* 0.2*  HGB 7.5*  --  7.0* 9.3* 7.6* 7.8*  HCT 23.7* 21.1* 22.3* 29.3* 24.5* 25.1*  MCV 96.0  --  94.9 93.0 94.2 95.8  PLT 25*  --  21* 26* 23* 26*   Basic Metabolic Panel: Recent Labs  Lab 01/19/18 0420 01/20/18 1028 01/21/18 0424 01/22/18 0428 01/23/18 0557  NA 141 141 139 143 141  K 4.0 3.9 4.0 3.7 3.5  CL 120* 122* 117* 121* 119*  CO2  17* 16* 16* 18* 20*  GLUCOSE 129* 108* 154* 122* 100*  BUN 32* 22* '18 17 16  '$ CREATININE 1.21* 0.92 0.87 0.77 0.68  CALCIUM 7.3* 7.4* 7.6* 7.5* 7.7*   GFR: Estimated Creatinine Clearance: 67.7 mL/min (by C-G formula based on SCr of 0.68 mg/dL). Liver Function Tests: Recent Labs  Lab 01/19/18 0420 01/19/18 0747 01/20/18 1028 01/21/18 0424 01/22/18 0428 01/23/18 0557  AST 56*  --  44* 50* 35 38  ALT 222*  --  151* 138* 93* 74*  ALKPHOS 235*  --  315* 479* 410* 388*    BILITOT 2.2* 1.8* 1.8* 2.3* 1.3* 1.2  PROT 4.5*  --  4.2* 4.6* 4.1* 4.3*  ALBUMIN 2.7*  --  2.4* 2.6* 2.3* 2.4*   No results for input(s): LIPASE, AMYLASE in the last 168 hours. No results for input(s): AMMONIA in the last 168 hours. Coagulation Profile: Recent Labs  Lab 01/18/18 1216 01/22/18 0428  INR 1.23 1.09   Cardiac Enzymes: Recent Labs  Lab 01/18/18 1216  TROPONINI <0.03   BNP (last 3 results) No results for input(s): PROBNP in the last 8760 hours. HbA1C: No results for input(s): HGBA1C in the last 72 hours. CBG: Recent Labs  Lab 01/22/18 0735 01/22/18 1120 01/22/18 1644 01/22/18 2231 01/23/18 0731  GLUCAP 100* 147* 191* 136* 96   Lipid Profile: Recent Labs    01/22/18 0428  TRIG 225*   Thyroid Function Tests: No results for input(s): TSH, T4TOTAL, FREET4, T3FREE, THYROIDAB in the last 72 hours. Anemia Panel: Recent Labs    01/22/18 0428  FERRITIN 1,362*   Urine analysis:    Component Value Date/Time   COLORURINE YELLOW 01/21/2018 0430   APPEARANCEUR CLEAR 01/21/2018 0430   LABSPEC 1.020 01/21/2018 0430   PHURINE 6.0 01/21/2018 0430   GLUCOSEU NEGATIVE 01/21/2018 0430   HGBUR NEGATIVE 01/21/2018 0430   BILIRUBINUR NEGATIVE 01/21/2018 0430   KETONESUR NEGATIVE 01/21/2018 0430   PROTEINUR 30 (A) 01/21/2018 0430   NITRITE NEGATIVE 01/21/2018 0430   LEUKOCYTESUR NEGATIVE 01/21/2018 0430   Sepsis Labs: Invalid input(s): PROCALCITONIN, LACTICIDVEN  Recent Results (from the past 240 hour(s))  Blood Culture (routine x 2)     Status: None   Collection Time: 01/18/18 12:16 PM  Result Value Ref Range Status   Specimen Description LEFT ANTECUBITAL  Final   Special Requests   Final    BOTTLES DRAWN AEROBIC AND ANAEROBIC Blood Culture adequate volume   Culture   Final    NO GROWTH 5 DAYS Performed at [facility], [address], [place], [place] [address]    Report Status 01/23/2018 FINAL  Final  Blood Culture (routine x 2)     Status: None    Collection Time: 01/18/18 12:21 PM  Result Value Ref Range Status   Specimen Description BLOOD LEFT ARM  Final   Special Requests   Final    BOTTLES DRAWN AEROBIC AND ANAEROBIC Blood Culture adequate volume   Culture   Final    NO GROWTH 5 DAYS Performed at [facility], [address], [place], [place] [address]    Report Status 01/23/2018 FINAL  Final  Urine culture     Status: Abnormal   Collection Time: 01/18/18 12:45 PM  Result Value Ref Range Status   Specimen Description   Final    URINE, CLEAN CATCH Performed at [facility], [address], [place], [place] [address]    Special Requests   Final    NONE Performed at [facility], [address], [place], [place] [address]    Culture MULTIPLE SPECIES PRESENT, SUGGEST RECOLLECTION (A)  Final   Report Status 01/19/2018 FINAL  Final  Respiratory Panel by PCR     Status: Abnormal   Collection Time: 01/18/18  4:02 PM  Result Value Ref Range Status   Adenovirus NOT DETECTED NOT DETECTED Final   Coronavirus 229E NOT DETECTED NOT DETECTED Final   Coronavirus HKU1 NOT DETECTED NOT DETECTED Final   Coronavirus NL63 NOT DETECTED NOT DETECTED Final   Coronavirus OC43 NOT DETECTED NOT DETECTED Final   Metapneumovirus NOT DETECTED NOT DETECTED Final   Rhinovirus / Enterovirus DETECTED (A) NOT DETECTED Final   Influenza A NOT DETECTED NOT DETECTED Final   Influenza B NOT DETECTED NOT DETECTED Final   Parainfluenza Virus 1 NOT DETECTED NOT DETECTED Final   Parainfluenza Virus 2 NOT DETECTED NOT DETECTED Final   Parainfluenza Virus 3 NOT DETECTED NOT DETECTED Final   Parainfluenza Virus 4 NOT DETECTED NOT DETECTED Final   Respiratory Syncytial Virus NOT DETECTED NOT DETECTED Final   Bordetella pertussis NOT DETECTED NOT DETECTED Final   Chlamydophila pneumoniae NOT DETECTED NOT DETECTED Final   Mycoplasma pneumoniae NOT DETECTED NOT DETECTED Final    Comment: Performed at [facility], [place] [address] [place], [place] [address]    MRSA PCR Screening     Status: Abnormal   Collection Time: 01/18/18  4:02 PM  Result Value Ref Range Status   MRSA by PCR POSITIVE (A) NEGATIVE Final    Comment:        The GeneXpert MRSA Assay (FDA approved for NASAL specimens only), is one component of a comprehensive MRSA colonization surveillance program. It is not intended to diagnose MRSA infection nor to guide or monitor treatment for MRSA infections. RESULT CALLED TO, READ BACK BY AND VERIFIED WITH: EVANS,H. AT 2012 ON 01/18/2017 BY EVA Performed at [facility], [address], [place],  [address]       Radiology Studies: No results found.  Marzetta Board, MD, PhD [facility]  Contact via  [email or web address]  [place] P: [phone]  F: [phone]

## 2018-01-24 ENCOUNTER — Inpatient Hospital Stay (HOSPITAL_COMMUNITY): Payer: Commercial Managed Care - PPO

## 2018-01-24 DIAGNOSIS — Z01810 Encounter for preprocedural cardiovascular examination: Secondary | ICD-10-CM

## 2018-01-24 DIAGNOSIS — E860 Dehydration: Secondary | ICD-10-CM

## 2018-01-24 DIAGNOSIS — Z452 Encounter for adjustment and management of vascular access device: Secondary | ICD-10-CM

## 2018-01-24 LAB — CBC WITH DIFFERENTIAL/PLATELET
Abs Immature Granulocytes: 0.01 10*3/uL (ref 0.00–0.07)
Basophils Absolute: 0 10*3/uL (ref 0.0–0.1)
Basophils Relative: 0 %
Eosinophils Absolute: 0 10*3/uL (ref 0.0–0.5)
Eosinophils Relative: 0 %
HEMATOCRIT: 22.6 % — AB (ref 36.0–46.0)
Hemoglobin: 7.1 g/dL — ABNORMAL LOW (ref 12.0–15.0)
Immature Granulocytes: 1 %
Lymphocytes Relative: 73 %
Lymphs Abs: 0.6 10*3/uL — ABNORMAL LOW (ref 0.7–4.0)
MCH: 30.1 pg (ref 26.0–34.0)
MCHC: 31.4 g/dL (ref 30.0–36.0)
MCV: 95.8 fL (ref 80.0–100.0)
Monocytes Absolute: 0.1 10*3/uL (ref 0.1–1.0)
Monocytes Relative: 6 %
NEUTROS ABS: 0.2 10*3/uL — AB (ref 1.7–7.7)
NEUTROS PCT: 20 %
Platelets: 24 10*3/uL — CL (ref 150–400)
RBC: 2.36 MIL/uL — ABNORMAL LOW (ref 3.87–5.11)
RDW: 18 % — ABNORMAL HIGH (ref 11.5–15.5)
WBC: 0.8 10*3/uL — CL (ref 4.0–10.5)
nRBC: 0 % (ref 0.0–0.2)

## 2018-01-24 LAB — COMPREHENSIVE METABOLIC PANEL
ALT: 61 U/L — ABNORMAL HIGH (ref 0–44)
AST: 36 U/L (ref 15–41)
Albumin: 2.4 g/dL — ABNORMAL LOW (ref 3.5–5.0)
Alkaline Phosphatase: 353 U/L — ABNORMAL HIGH (ref 38–126)
Anion gap: 3 — ABNORMAL LOW (ref 5–15)
BILIRUBIN TOTAL: 1.2 mg/dL (ref 0.3–1.2)
BUN: 16 mg/dL (ref 6–20)
CO2: 21 mmol/L — ABNORMAL LOW (ref 22–32)
Calcium: 7.9 mg/dL — ABNORMAL LOW (ref 8.9–10.3)
Chloride: 116 mmol/L — ABNORMAL HIGH (ref 98–111)
Creatinine, Ser: 0.72 mg/dL (ref 0.44–1.00)
GFR calc Af Amer: >60 mL/min (ref >60)
Glucose, Bld: 110 mg/dL — ABNORMAL HIGH (ref 70–99)
Potassium: 3.4 mmol/L — ABNORMAL LOW (ref 3.5–5.1)
Sodium: 140 mmol/L (ref 135–145)
Total Protein: 4.4 g/dL — ABNORMAL LOW (ref 6.5–8.1)

## 2018-01-24 LAB — PREPARE RBC (CROSSMATCH)

## 2018-01-24 LAB — GLUCOSE, CAPILLARY: Glucose-Capillary: 117 mg/dL — ABNORMAL HIGH (ref 70–99)

## 2018-01-24 LAB — ECHOCARDIOGRAM COMPLETE
Height: 63 in
Weight: 2222.24 oz

## 2018-01-24 MED ORDER — VITAMIN B-12 1000 MCG PO TABS: 1000.0000 ug | ORAL_TABLET | Freq: Every day | ORAL | 0 refills | Status: AC

## 2018-01-24 MED ORDER — SODIUM CHLORIDE 0.9 % IV SOLN
1.0000 g | Freq: Three times a day (TID) | INTRAVENOUS | Status: DC
  Filled 2018-01-24 (×5): qty 1

## 2018-01-24 MED ORDER — SODIUM CHLORIDE 0.9% IV SOLUTION
Freq: Once | INTRAVENOUS | Status: AC
  Administered 2018-01-24: 10:00:00 via INTRAVENOUS

## 2018-01-24 NOTE — Discharge Instructions (Signed)
Follow with Dr. Delton Coombes in 2 days. Call 1st thing Monday morning  Please get a complete blood count and chemistry panel checked by your Primary MD at your next visit, and again as instructed by your Primary MD. Please get your medications reviewed and adjusted by your Primary MD.  Please request your Primary MD to go over all Hospital Tests and Procedure/Radiological results at the follow up, please get all Hospital records sent to your Prim MD by signing hospital release before you go home.  If you had Pneumonia of Lung problems at the Hospital: Please get a 2 view Chest X ray done in 6-8 weeks after hospital discharge or sooner if instructed by your Primary MD.  If you have Congestive Heart Failure: Please call your Cardiologist or Primary MD anytime you have any of the following symptoms:  1) 3 pound weight gain in 24 hours or 5 pounds in 1 week  2) shortness of breath, with or without a dry hacking cough  3) swelling in the hands, feet or stomach  4) if you have to sleep on extra pillows at night in order to breathe  Follow cardiac low salt diet and 1.5 lit/day fluid restriction.  If you have diabetes Accuchecks 4 times/day, Once in AM empty stomach and then before each meal. Log in all results and show them to your primary doctor at your next visit. If any glucose reading is under 80 or above 300 call your primary MD immediately.  If you have Seizure/Convulsions/Epilepsy: Please do not drive, operate heavy machinery, participate in activities at heights or participate in high speed sports until you have seen by Primary MD or a Neurologist and advised to do so again.  If you had Gastrointestinal Bleeding: Please ask your Primary MD to check a complete blood count within one week of discharge or at your next visit. Your endoscopic/colonoscopic biopsies that are pending at the time of discharge, will also need to followed by your Primary MD.  Get Medicines reviewed and  adjusted. Please take all your medications with you for your next visit with your Primary MD  Please request your Primary MD to go over all hospital tests and procedure/radiological results at the follow up, please ask your Primary MD to get all Hospital records sent to his/her office.  If you experience worsening of your admission symptoms, develop shortness of breath, life threatening emergency, suicidal or homicidal thoughts you must seek medical attention immediately by calling 911 or calling your MD immediately  if symptoms less severe.  You must read complete instructions/literature along with all the possible adverse reactions/side effects for all the Medicines you take and that have been prescribed to you. Take any new Medicines after you have completely understood and accpet all the possible adverse reactions/side effects.   Do not drive or operate heavy machinery when taking Pain medications.   Do not take more than prescribed Pain, Sleep and Anxiety Medications  Special Instructions: If you have smoked or chewed Tobacco  in the last 2 yrs please stop smoking, stop any regular Alcohol  and or any Recreational drug use.  Wear Seat belts while driving.  Please note You were cared for by a hospitalist during your hospital stay. If you have any questions about your discharge medications or the care you received while you were in the hospital after you are discharged, you can call the unit and asked to speak with the hospitalist on call if the hospitalist that took care of you  is not available. Once you are discharged, your primary care physician will handle any further medical issues. Please note that NO REFILLS for any discharge medications will be authorized once you are discharged, as it is imperative that you return to your primary care physician (or establish a relationship with a primary care physician if you do not have one) for your aftercare needs so that they can reassess your need  for medications and monitor your lab values.  You can reach the hospitalist office at phone [phone] or fax [phone]   If you do not have a primary care physician, you can call [phone] for a physician referral.  Activity: As tolerated with Full fall precautions use walker/cane & assistance as needed  Diet: regular  Disposition Home

## 2018-01-24 NOTE — Progress Notes (Signed)
Echocardiogram 2D Echocardiogram has been performed.  Kristin Good 01/24/2018, 9:55 AM

## 2018-01-24 NOTE — Discharge Summary (Signed)
Physician Discharge Summary  Kristin Good XMI:[id number] DOB: 1960-04-28 DOA: 01/18/2018  PCP: Marinda Elk, MD  Admit date: 01/18/2018 Discharge date: 01/24/2018  Admitted From: home Disposition:  home  Recommendations for Outpatient Follow-up:  1. Follow up with Dr. Delton Coombes in 2 days 2. Please obtain BMP/CBC at the next appointment   Home Health: none Equipment/Devices: none  Discharge Condition: stable CODE STATUS: Full code Diet recommendation: regular  HPI: Per admitting MD, Kristin Good is a 58 y.o. female with medical history of some type of hematologic malignancy, diabetes mellitus, hypertension, hypothyroidism, melanoma presenting with 1 month history of fevers, chills, night sweats, and generalized weakness.  The patient states that she went on vacation to [facility] approximately 2 weeks prior to Christmas.  She went to the emergency department in [facility] with similar symptoms.  She was told that she had a viral infection and discharged in stable condition.  The patient subsequently came home back to [place] on the week of Christmas.  She went to see her primary care provider with a similar symptoms.  Once again, the patient was told that she has some type of viral infection.  However, the patient's generalized weakness and fevers persisted.  Today the patient was going to work and getting out of her car when she had a near syncopal episode secondary to dizziness.  She also had a temperature of 102.9 F.  As result, the patient drove herself to the emergency department for further evaluation.  The patient states that she has had intermittent epistaxis for the better part of a month that has been self-limited.  She denies any hemoptysis, hematemesis, hematochezia, melena.  She denies any headache, chest pain, nausea, vomiting, diarrhea, abdominal pain, dysuria, hematuria, neck pain.  She has had dyspnea on exertion for the better part of the month.  The  patient denies starting any new medications.  She does not take any over-the-counter medications. 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  The patient was treated with chlorambucil, prednisone, and radiation therapy.  She has been in remission since her teenage years.  She states that during her multiple visits to the emergency department and PCP, blood work was performed and she was never told of any concerns. In the emergency department, the patient was febrile up to 102.6 F with tachycardia and soft blood pressure with systolic blood pressure in the 80s.  Oxygen saturation was 100% on room air.  The patient was started vancomycin and aztreonam and IV fluids.  Hospital Course: Principal Problem SIRS -patient was admitted to the hospital with febrile illness as well as a pancytopenia.  She was placed empirically on broad-spectrum antibiotics and cultures were obtained.  Given pancytopenia oncology was consulted and followed patient while hospitalized.  The source for her fever is unclear,work-up in progress but negative so far, influenza was negative, EBV DNA quantitative PCR positive, discussed with ID this unlikely represents an acute infection, CMV, HIV, hep B, hep C all negative.  She did test positive for rhinovirus on admission suggesting an URI for which she received supportive management.  Due to persistent pancytopenia she underwent a bone marrow biopsy on 01/21/2018.  Final results are not yet back, however per Dr. Delton Coombes who has in turn discussed with pathology it looks like T-cell lymphoma which can overall explain her recurrent fevers as well as pancytopenia.  Given  thrombocytopenia a PICC line was placed instead of a port and she will probably start chemotherapy as an outpatient soon as pathology diagnosis is finalized early next week.  All her cultures have remained negative, she was  maintained on broad-spectrum antibiotics for 7 days, she is afebrile her antibiotics have been discontinued.  In preparation for chemotherapy she also underwent a 2D echo which showed normal EF 65-70%, no WMA and grade 1 diastolic dysfunction (full report below) Pancytopenia -The case was discussed with hematology, Dr. 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 Transaminasemia -Abdominal ultrasound--marked splenomegaly; diffuse echogenicity of portal triads suggest liver inflam; diffuse GB wall thickening, but completely asymptomatic. Viral serologies negative as above, her LFTs are improving and almost normalized. Diabetes mellitus type 2 -Hemoglobin A1c--5.7, CBGs have been well controlled, continue current regimen Hypothyroidism -Continue levothyroxine Essential hypertension -resume home medications  Hyperlipidemia -resume home medications  Discharge Diagnoses:  Active Problems:   Sepsis due to undetermined organism ([place])   Sepsis ([place])   Pancytopenia ([place])   Essential hypertension   Transaminasemia   Protein-calorie malnutrition, severe    Discharge Instructions   Allergies as of 01/24/2018      Reactions   Doxycycline Anaphylaxis   Penicillins Anaphylaxis   Has taken Keflex in past with no issues.   Levofloxacin Nausea And Vomiting   Sulfa Antibiotics       Medication List    TAKE these medications   atorvastatin 20 MG tablet Commonly known as:  LIPITOR Take 1 tablet by mouth daily.   CALCIUM 500+D HIGH POTENCY 500-400 MG-UNIT tablet Generic drug:  calcium-vitamin D Take 1 tablet by mouth 2 (two) times daily.   furosemide 20 MG  tablet Commonly known as:  LASIX Take 1 tablet by mouth daily as needed.   GLUCOSAMINE 1500 COMPLEX PO Take 1 capsule by mouth 2 (two) times daily.   levothyroxine 75 MCG tablet Commonly known as:  SYNTHROID, LEVOTHROID Take 75 mcg by mouth daily before breakfast.   losartan 100 MG tablet Commonly known as:  COZAAR Take 100 mg by mouth daily.   metFORMIN 1000 MG tablet Commonly known as:  GLUCOPHAGE Take 1 tablet by mouth 2 (two) times daily.   SOLIQUA 100-33 UNT-MCG/ML Sopn Generic drug:  Insulin Glargine-Lixisenatide Inject 56 Units into the skin daily.   triamterene-hydrochlorothiazide 37.5-25 MG tablet Commonly known as:  MAXZIDE-25 Take 1 tablet by mouth daily.   valACYclovir 500 MG tablet Commonly known as:  VALTREX Take 1,000 mg by mouth 2 (two) times daily as needed.   vitamin B-12 1000 MCG tablet Commonly known as:  CYANOCOBALAMIN Take 1 tablet (1,000 mcg total) by mouth daily.   vitamin C 500 MG tablet Commonly known as:  ASCORBIC ACID Take 500 mg by mouth 2 (two) times daily.      Follow-up Information    Derek Jack, MD. Call in 2 day(s).   Specialty:  Hematology Why:  call first thing monday morning Contact information: [address] [place] [address] [phone]           Consultations:  Hematology  Procedures/Studies:  2D echo  Study Conclusions - Left ventricle: The cavity size  was normal. Wall thickness was increased in a pattern of mild LVH. Systolic function was vigorous. The estimated ejection fraction was in the range of 65% to 70%. Wall motion was normal; there were no regional wall motion abnormalities. Doppler parameters are consistent with abnormal left ventricular relaxation (grade 1 diastolic dysfunction). - Aortic valve: Valve area (Vmax): 2.36 cm^2.   Ct Chest W Contrast  Addendum Date: 01/20/2018   ADDENDUM REPORT: 01/20/2018 16:48 ADDENDUM: The following addition is made to the IMPRESSION: 7. Nonspecific  curvilinear hyperdense material in the periurethral region, new since 2009 CT. Findings could be due to prior procedure. If there is no history of a procedure to explain these findings, a urethral diverticulum with hemorrhagic/proteinaceous material or enhancing mass can not be excluded and further evaluation with a urethral protocol MRI pelvis without and with IV contrast could be considered. These addended results will be called to the ordering clinician or representative by the Radiologist Assistant, and communication documented in the PACS or zVision Dashboard. Electronically Signed   By: Ilona Sorrel M.D.   On: 01/20/2018 16:48   Result Date: 01/20/2018 CLINICAL DATA:  Inpatient. Pancytopenia. Weight loss. Fever. Generalized weakness. Marked splenomegaly on ultrasound. 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 EXAM: CT CHEST, ABDOMEN, AND PELVIS WITH CONTRAST TECHNIQUE: Multidetector CT imaging of the chest, abdomen and pelvis was performed following the standard protocol during bolus administration of intravenous contrast. CONTRAST:  86m ISOVUE-300 IOPAMIDOL (ISOVUE-300) INJECTION 61%, 1035mISOVUE-300 IOPAMIDOL (ISOVUE-300) INJECTION 61% COMPARISON:  01/19/2018 abdominal sonogram. 11/09/2007 CT abdomen/pelvis. FINDINGS: CT CHEST FINDINGS Cardiovascular: Normal heart size. Trace pericardial effusion/thickening. Three-vessel coronary atherosclerosis. Atherosclerotic nonaneurysmal thoracic aorta. Top-normal caliber main pulmonary artery (3.1 cm diameter). No central pulmonary emboli. Mediastinum/Nodes: Small bilateral thyroid nodules, largest 1.3 cm with coarse calcification in the left thyroid lobe. Unremarkable esophagus. No pathologically enlarged axillary, mediastinal or hilar lymph nodes. Lungs/Pleura: No pneumothorax. Small dependent bilateral pleural effusions. Mild compressive atelectasis in the dependent lower lobes bilaterally. No acute consolidative airspace disease, lung masses or significant pulmonary  nodules. Musculoskeletal: Heterogeneous low-attenuation throughout the thoracic vertebral marrow without discrete osseous lesions. Mild thoracic spondylosis. CT ABDOMEN PELVIS FINDINGS Hepatobiliary: Top-normal liver size. No liver surface irregularity. Simple 0.9 cm lateral segment left liver lobe cyst. Otherwise no liver masses. Marked diffuse gallbladder wall thickening. No radiopaque cholelithiasis. No gallbladder distention. Mild diffuse periportal edema. No biliary ductal dilatation. Pancreas: Normal, with no mass or duct dilation. Spleen: Marked splenomegaly, increased from 2009. Splenic dimensions 15.4 x 6.5 x 22.6 cm (volume = 1200 cm^3). No splenic masses. Adrenals/Urinary Tract: Normal adrenals. No hydronephrosis. Subcentimeter hypodense renal cortical lesion in the lower left kidney is too small to characterize and requires no follow-up. No additional renal lesions. Normal bladder. There is nonspecific curvilinear hyperdense material in the periurethral region (series 2/image 118), new since 2009 CT. Stomach/Bowel: Normal non-distended stomach. Normal caliber small bowel with no small bowel wall thickening. Normal appendix. Normal large bowel with no diverticulosis, large bowel wall thickening or pericolonic fat stranding. Oral contrast transits to the left colon. Vascular/Lymphatic: Atherosclerotic nonaneurysmal abdominal aorta. Patent portal, splenic, hepatic and renal veins. No pathologically enlarged lymph nodes in the abdomen or pelvis. Reproductive: Mildly enlarged heterogeneous anteverted uterus, similar to the 2009 CT. No adnexal masses. Other: No pneumoperitoneum, ascites or focal fluid collection. Musculoskeletal: Heterogeneous low-attenuation throughout the lumbar vertebral marrow without discrete osseous lesions. IMPRESSION: 1. Marked splenomegaly. Heterogeneous low-attenuation throughout the thoracolumbar vertebral marrow without discrete osseous lesions. These nonspecific findings raise  concern for  a lymphoproliferative disorder with infiltrative marrow process. 2. No lymphadenopathy in the chest, abdomen or pelvis. 3. Small dependent bilateral pleural effusions. 4. Prominent diffuse gallbladder wall thickening and mild diffuse periportal edema. These findings are most compatible with noninflammatory edema, probably due to the patient's hypoalbuminemia. 5. Three-vessel coronary atherosclerosis. 6.  Aortic Atherosclerosis (ICD10-I70.0). Electronically Signed: By: Ilona Sorrel M.D. On: 01/20/2018 16:20   US Abdomen Complete  Result Date: 01/19/2018 CLINICAL DATA:  Acute kidney injury. EXAM: ABDOMEN ULTRASOUND COMPLETE COMPARISON:  10/26/9 FINDINGS: Gallbladder: The gallbladder wall is diffusely thickened measuring 13.4 mm in thickness. No gallstones or pericholecystic fluid. Negative sonographic Murphy's sign. Common bile duct: Diameter: 4.4 mm. Liver: No focal lesion identified. There is increased echogenicity of the portal triads diffusely. Portal vein is patent on color Doppler imaging with normal direction of blood flow towards the liver. IVC: No abnormality visualized. Pancreas: Visualized portion unremarkable. Spleen: 20.1 cm in length with a volume of 1905 cc Right Kidney: Length: 11.8 cm. Echogenicity within normal limits. No mass or hydronephrosis visualized. Left Kidney: Length: 11.8 cm. Echogenicity within normal limits. No mass or hydronephrosis visualized. Abdominal aorta: No aneurysm visualized. Other findings: None. IMPRESSION: 1. There is marked splenomegaly. 2. Diffuse increased echogenicity of the portal triads which may be seen with liver inflammation. The 3. Marked diffuse gallbladder wall thickening. No gallstones, gallbladder sludge or sonographic Murphy's sign. This is a nonspecific finding and although may be seen with cholecystitis if there is a history of portal venous hypertension due to cirrhosis or fluid overload this could result in edematous appearing gallbladder.  Clinical correlation is advised. 4. No hydronephrosis identified bilaterally. Electronically Signed   By: Kerby Moors M.D.   On: 01/19/2018 14:26   Ct Abdomen Pelvis W Contrast  Addendum Date: 01/20/2018   ADDENDUM REPORT: 01/20/2018 16:48 ADDENDUM: The following addition is made to the IMPRESSION: 7. Nonspecific curvilinear hyperdense material in the periurethral region, new since 2009 CT. Findings could be due to prior procedure. If there is no history of a procedure to explain these findings, a urethral diverticulum with hemorrhagic/proteinaceous material or enhancing mass can not be excluded and further evaluation with a urethral protocol MRI pelvis without and with IV contrast could be considered. These addended results will be called to the ordering clinician or representative by the Radiologist Assistant, and communication documented in the PACS or zVision Dashboard. Electronically Signed   By: Ilona Sorrel M.D.   On: 01/20/2018 16:48   Result Date: 01/20/2018 CLINICAL DATA:  Inpatient. Pancytopenia. Weight loss. Fever. Generalized weakness. Marked splenomegaly on ultrasound. 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 EXAM: CT CHEST, ABDOMEN, AND PELVIS WITH CONTRAST TECHNIQUE: Multidetector CT imaging of the chest, abdomen and pelvis was performed following the standard protocol during bolus administration of intravenous contrast. CONTRAST:  75m ISOVUE-300 IOPAMIDOL (ISOVUE-300) INJECTION 61%, 1013mISOVUE-300 IOPAMIDOL (ISOVUE-300) INJECTION 61% COMPARISON:  01/19/2018 abdominal sonogram. 11/09/2007 CT abdomen/pelvis. FINDINGS: CT CHEST FINDINGS Cardiovascular: Normal heart size. Trace pericardial effusion/thickening. Three-vessel coronary atherosclerosis. Atherosclerotic nonaneurysmal thoracic aorta. Top-normal caliber main pulmonary artery (3.1 cm diameter). No central pulmonary emboli. Mediastinum/Nodes: Small bilateral thyroid nodules, largest 1.3 cm with coarse calcification in the left thyroid lobe.  Unremarkable esophagus. No pathologically enlarged axillary, mediastinal or hilar lymph nodes. Lungs/Pleura: No pneumothorax. Small dependent bilateral pleural effusions. Mild compressive atelectasis in the dependent lower lobes bilaterally. No acute consolidative airspace disease, lung masses or significant pulmonary nodules. Musculoskeletal: Heterogeneous low-attenuation throughout the thoracic vertebral marrow without discrete osseous lesions. Mild thoracic spondylosis.  CT ABDOMEN PELVIS FINDINGS Hepatobiliary: Top-normal liver size. No liver surface irregularity. Simple 0.9 cm lateral segment left liver lobe cyst. Otherwise no liver masses. Marked diffuse gallbladder wall thickening. No radiopaque cholelithiasis. No gallbladder distention. Mild diffuse periportal edema. No biliary ductal dilatation. Pancreas: Normal, with no mass or duct dilation. Spleen: Marked splenomegaly, increased from 2009. Splenic dimensions 15.4 x 6.5 x 22.6 cm (volume = 1200 cm^3). No splenic masses. Adrenals/Urinary Tract: Normal adrenals. No hydronephrosis. Subcentimeter hypodense renal cortical lesion in the lower left kidney is too small to characterize and requires no follow-up. No additional renal lesions. Normal bladder. There is nonspecific curvilinear hyperdense material in the periurethral region (series 2/image 118), new since 2009 CT. Stomach/Bowel: Normal non-distended stomach. Normal caliber small bowel with no small bowel wall thickening. Normal appendix. Normal large bowel with no diverticulosis, large bowel wall thickening or pericolonic fat stranding. Oral contrast transits to the left colon. Vascular/Lymphatic: Atherosclerotic nonaneurysmal abdominal aorta. Patent portal, splenic, hepatic and renal veins. No pathologically enlarged lymph nodes in the abdomen or pelvis. Reproductive: Mildly enlarged heterogeneous anteverted uterus, similar to the 2009 CT. No adnexal masses. Other: No pneumoperitoneum, ascites or focal  fluid collection. Musculoskeletal: Heterogeneous low-attenuation throughout the lumbar vertebral marrow without discrete osseous lesions. IMPRESSION: 1. Marked splenomegaly. Heterogeneous low-attenuation throughout the thoracolumbar vertebral marrow without discrete osseous lesions. These nonspecific findings raise concern for a lymphoproliferative disorder with infiltrative marrow process. 2. No lymphadenopathy in the chest, abdomen or pelvis. 3. Small dependent bilateral pleural effusions. 4. Prominent diffuse gallbladder wall thickening and mild diffuse periportal edema. These findings are most compatible with noninflammatory edema, probably due to the patient's hypoalbuminemia. 5. Three-vessel coronary atherosclerosis. 6.  Aortic Atherosclerosis (ICD10-I70.0). Electronically Signed: By: Ilona Sorrel M.D. On: 01/20/2018 16:20   Dg Chest Port 1 View  Result Date: 01/23/2018 CLINICAL DATA:  PICC line EXAM: PORTABLE CHEST 1 VIEW COMPARISON:  01/18/2017 FINDINGS: Right upper extremity catheter tip projects over the cavoatrial region. There is a small left-sided pleural effusion. No focal consolidation. Normal heart size. No pneumothorax. IMPRESSION: 1. Right upper extremity catheter tip over the cavoatrial region 2. Small left effusion Electronically Signed   By: Donavan Foil M.D.   On: 01/23/2018 20:57   Dg Chest Port 1 View  Result Date: 01/18/2018 CLINICAL DATA:  58 y/o  F; cough, shortness of breath, fever. EXAM: PORTABLE CHEST 1 VIEW COMPARISON:  None. FINDINGS: The heart size and mediastinal contours are within normal limits. Both lungs are clear. The visualized skeletal structures are unremarkable. IMPRESSION: No active disease. Electronically Signed   By: Kristine Garbe M.D.   On: 01/18/2018 13:06     Subjective: - no chest pain, shortness of breath, no abdominal pain, nausea or vomiting.   Discharge Exam: Vitals:   01/24/18 1258 01/24/18 1523  BP: 131/65 134/81  Pulse: 95 88    Resp: 18 18  Temp: 98.6 F (37 C) 99.6 F (37.6 C)  SpO2: 100% 100%    General: Pt is alert, awake, not in acute distress Cardiovascular: RRR, 3/6 SEM Respiratory: CTA bilaterally, no wheezing, no rhonchi Abdominal: Soft, NT, ND, bowel sounds + Extremities: no edema, no cyanosis    The results of significant diagnostics from this hospitalization (including imaging, microbiology, ancillary and laboratory) are listed below for reference.     Microbiology: Recent Results (from the past 240 hour(s))  Blood Culture (routine x 2)     Status: None   Collection Time: 01/18/18 12:16 PM  Result Value Ref  Range Status   Specimen Description LEFT ANTECUBITAL  Final   Special Requests   Final    BOTTLES DRAWN AEROBIC AND ANAEROBIC Blood Culture adequate volume   Culture   Final    NO GROWTH 5 DAYS Performed at [facility], [address], [place], [place] [address]    Report Status 01/23/2018 FINAL  Final  Blood Culture (routine x 2)     Status: None   Collection Time: 01/18/18 12:21 PM  Result Value Ref Range Status   Specimen Description BLOOD LEFT ARM  Final   Special Requests   Final    BOTTLES DRAWN AEROBIC AND ANAEROBIC Blood Culture adequate volume   Culture   Final    NO GROWTH 5 DAYS Performed at [facility], [address], [place], [place] [address]    Report Status 01/23/2018 FINAL  Final  Urine culture     Status: Abnormal   Collection Time: 01/18/18 12:45 PM  Result Value Ref Range Status   Specimen Description   Final    URINE, CLEAN CATCH Performed at [facility], [address] [place], [place] [address]    Special Requests   Final    NONE Performed at [facility], [address] [place], [place] [address]    Culture MULTIPLE SPECIES PRESENT, SUGGEST RECOLLECTION (A)  Final   Report Status 01/19/2018 FINAL  Final  Respiratory Panel by PCR     Status: Abnormal   Collection Time: 01/18/18  4:02 PM  Result Value Ref Range Status   Adenovirus NOT DETECTED  NOT DETECTED Final   Coronavirus 229E NOT DETECTED NOT DETECTED Final   Coronavirus HKU1 NOT DETECTED NOT DETECTED Final   Coronavirus NL63 NOT DETECTED NOT DETECTED Final   Coronavirus OC43 NOT DETECTED NOT DETECTED Final   Metapneumovirus NOT DETECTED NOT DETECTED Final   Rhinovirus / Enterovirus DETECTED (A) NOT DETECTED Final   Influenza A NOT DETECTED NOT DETECTED Final   Influenza B NOT DETECTED NOT DETECTED Final   Parainfluenza Virus 1 NOT DETECTED NOT DETECTED Final   Parainfluenza Virus 2 NOT DETECTED NOT DETECTED Final   Parainfluenza Virus 3 NOT DETECTED NOT DETECTED Final   Parainfluenza Virus 4 NOT DETECTED NOT DETECTED Final   Respiratory Syncytial Virus NOT DETECTED NOT DETECTED Final   Bordetella pertussis NOT DETECTED NOT DETECTED Final   Chlamydophila pneumoniae NOT DETECTED NOT DETECTED Final   Mycoplasma pneumoniae NOT DETECTED NOT DETECTED Final    Comment: Performed at [facility], [place] [address], [place], [place] [address]  MRSA PCR Screening     Status: Abnormal   Collection Time: 01/18/18  4:02 PM  Result Value Ref Range Status   MRSA by PCR POSITIVE (A) NEGATIVE Final    Comment:        The GeneXpert MRSA Assay (FDA approved for NASAL specimens only), is one component of a comprehensive MRSA colonization surveillance program. It is not intended to diagnose MRSA infection nor to guide or monitor treatment for MRSA infections. RESULT CALLED TO, READ BACK BY AND VERIFIED WITH: EVANS,H. AT 2012 ON 01/18/2017 BY EVA Performed at [facility], [address], [place], [place] [address]      Labs: BNP (last 3 results) Recent Labs    01/18/18 1218  BNP 07.1   Basic Metabolic Panel: Recent Labs  Lab 01/20/18 1028 01/21/18 0424 01/22/18 0428 01/23/18 0557 01/24/18 0628  NA 141 139 143 141 140  K 3.9 4.0 3.7 3.5 3.4*  CL  122* 117* 121* 119* 116*  CO2 16* 16* 18* 20* 21*  GLUCOSE 108* 154* 122* 100* 110*  BUN 22* _0 CREATININE  0.92 0.87 0.77 0.68 0.72  CALCIUM 7.4* 7.6* 7.5* 7.7* 7.9*   Liver Function Tests: Recent Labs  Lab 01/20/18 1028 01/21/18 0424 01/22/18 0428 01/23/18 0557 01/24/18 0628  AST 44* 50* 35 38 36  ALT 151* 138* 93* 74* 61*  ALKPHOS 315* 479* 410* 388* 353*  BILITOT 1.8* 2.3* 1.3* 1.2 1.2  PROT 4.2* 4.6* 4.1* 4.3* 4.4*  ALBUMIN 2.4* 2.6* 2.3* 2.4* 2.4*   No results for input(s): LIPASE, AMYLASE in the last 168 hours. No results for input(s): AMMONIA in the last 168 hours. CBC: Recent Labs  Lab 01/20/18 0410 01/21/18 0424 01/22/18 0428 01/23/18 0557 01/24/18 0628  WBC 0.5* 0.6* 0.8* 0.9* 0.8*  NEUTROABS 0.2* 0.2* 0.2* 0.2* 0.2*  HGB 7.0* 9.3* 7.6* 7.8* 7.1*  HCT 22.3* 29.3* 24.5* 25.1* 22.6*  MCV 94.9 93.0 94.2 95.8 95.8  PLT 21* 26* 23* 26* 24*   Cardiac Enzymes: Recent Labs  Lab 01/18/18 1216  TROPONINI <0.03   BNP: Invalid input(s): POCBNP CBG: Recent Labs  Lab 01/23/18 0731 01/23/18 1142 01/23/18 1626 01/23/18 2107 01/24/18 1211  GLUCAP 96 148* 121* 148* 117*   D-Dimer No results for input(s): DDIMER in the last 72 hours. Hgb A1c No results for input(s): HGBA1C in the last 72 hours. Lipid Profile Recent Labs    01/22/18 0428  TRIG 225*   Thyroid function studies No results for input(s): TSH, T4TOTAL, T3FREE, THYROIDAB in the last 72 hours.  Invalid input(s): FREET3 Anemia work up Recent Labs    01/22/18 0428  FERRITIN 1,362*   Urinalysis    Component Value Date/Time   COLORURINE YELLOW 01/21/2018 0430   APPEARANCEUR CLEAR 01/21/2018 0430   LABSPEC 1.020 01/21/2018 0430   PHURINE 6.0 01/21/2018 0430   GLUCOSEU NEGATIVE 01/21/2018 0430   HGBUR NEGATIVE 01/21/2018 0430   BILIRUBINUR NEGATIVE 01/21/2018 0430   KETONESUR NEGATIVE 01/21/2018 0430   PROTEINUR 30 (A) 01/21/2018 0430   NITRITE NEGATIVE 01/21/2018 0430   LEUKOCYTESUR NEGATIVE 01/21/2018 0430   Sepsis Labs Invalid input(s): PROCALCITONIN,  WBC,  LACTICIDVEN   Time  coordinating discharge: 35 minutes  SIGNED:  Marzetta Board, MD  [facility] 01/24/2018, 4:14 PM

## 2018-01-24 NOTE — Progress Notes (Signed)
Pharmacy Antibiotic Note  Kristin Good is a 58 y.o. female admitted on 01/18/2018 with sepsis.  Pharmacy has been consulted for meropenem dosing. Tmax 100, severe pancytopenia. Awaiting bone marrow bx from 1/8, still neutropenic. No obvious source of infection  Plan:  Continue meropenem 1g IV q8h for total of 7 days Pharmacy will continue to monitor clinical progress Monitor V/S, labs   Height: 5\' 3"  (160 cm) Weight: 138 lb 14.2 oz (63 kg) IBW/kg (Calculated) : 52.4  Temp (24hrs), Avg:99.6 F (37.6 C), Min:99.3 F (37.4 C), Max:100 F (37.8 C)  Recent Labs  Lab 01/18/18 1227 01/18/18 1546  01/20/18 0410 01/20/18 1028 01/21/18 0424 01/22/18 0428 01/23/18 0557 01/24/18 0628  WBC  --   --    < > 0.5*  --  0.6* 0.8* 0.9* 0.8*  CREATININE  --   --    < >  --  0.92 0.87 0.77 0.68 0.72  LATICACIDVEN 1.98* 1.84  --   --   --   --   --   --   --    < > = values in this interval not displayed.    Estimated Creatinine Clearance: 67.7 mL/min (by C-G formula based on SCr of 0.72 mg/dL).    Allergies  Allergen Reactions  . Doxycycline Anaphylaxis  . Penicillins Anaphylaxis    Has taken Keflex in past with no issues.  . Levofloxacin Nausea And Vomiting  . Sulfa Antibiotics     Antimicrobials this admission: aztreonam 1/5 >> 1/6 vancomycin 1/5 >>  1/9 Metronidazole 1/5>> 1/6 Meropenem 1/6>> Doxycycline 1/6 >> 1/9  Microbiology results: 1/5 BC x2: ngtd 1/5 UCx: suggest recollection 1/5 MRSA PCR: positive  1/9 Viral tests unremarkable   Thank you for allowing pharmacy to be a part of this patient's care.  Isac Sarna, BS Pharm D, [place] Clinical Pharmacist Pager [phone] 01/24/2018 9:06 AM

## 2018-01-24 NOTE — Progress Notes (Signed)
PICC line stable for D/C. D/C instructions given to pt, verbalized understanding. Pt spouse at bedside to transport home.

## 2018-01-25 LAB — TYPE AND SCREEN
ABO/RH(D): B POS
ANTIBODY SCREEN: NEGATIVE
Unit division: 0

## 2018-01-25 LAB — BPAM RBC
Blood Product Expiration Date: 202002082359
ISSUE DATE / TIME: 202001111235
Unit Type and Rh: 1700

## 2018-01-26 LAB — GLUCOSE, CAPILLARY: Glucose-Capillary: 100 mg/dL — ABNORMAL HIGH (ref 70–99)

## 2018-01-27 ENCOUNTER — Other Ambulatory Visit: Payer: Self-pay

## 2018-01-27 ENCOUNTER — Inpatient Hospital Stay (HOSPITAL_COMMUNITY): Payer: Commercial Managed Care - PPO | Attending: Hematology | Admitting: Hematology

## 2018-01-27 ENCOUNTER — Other Ambulatory Visit (HOSPITAL_COMMUNITY): Payer: Self-pay | Admitting: *Deleted

## 2018-01-27 ENCOUNTER — Encounter (HOSPITAL_COMMUNITY): Payer: Self-pay | Admitting: Hematology

## 2018-01-27 DIAGNOSIS — E876 Hypokalemia: Secondary | ICD-10-CM | POA: Diagnosis not present

## 2018-01-27 DIAGNOSIS — C8447 Peripheral T-cell lymphoma, not classified, spleen: Secondary | ICD-10-CM | POA: Diagnosis present

## 2018-01-27 DIAGNOSIS — Z5111 Encounter for antineoplastic chemotherapy: Secondary | ICD-10-CM | POA: Diagnosis present

## 2018-01-27 LAB — CBC WITH DIFFERENTIAL/PLATELET
Abs Immature Granulocytes: 0.02 10*3/uL (ref 0.00–0.07)
Basophils Absolute: 0 10*3/uL (ref 0.0–0.1)
Basophils Relative: 1 %
Eosinophils Absolute: 0 10*3/uL (ref 0.0–0.5)
Eosinophils Relative: 0 %
HCT: 24.2 % — ABNORMAL LOW (ref 36.0–46.0)
Hemoglobin: 7.6 g/dL — ABNORMAL LOW (ref 12.0–15.0)
Immature Granulocytes: 3 %
Lymphocytes Relative: 75 %
Lymphs Abs: 0.6 10*3/uL — ABNORMAL LOW (ref 0.7–4.0)
MCH: 28.9 pg (ref 26.0–34.0)
MCHC: 31.4 g/dL (ref 30.0–36.0)
MCV: 92 fL (ref 80.0–100.0)
MONOS PCT: 5 %
Monocytes Absolute: 0 10*3/uL — ABNORMAL LOW (ref 0.1–1.0)
Neutro Abs: 0.1 10*3/uL — ABNORMAL LOW (ref 1.7–7.7)
Neutrophils Relative %: 16 %
Platelets: 22 10*3/uL — CL (ref 150–400)
RBC: 2.63 MIL/uL — ABNORMAL LOW (ref 3.87–5.11)
RDW: 17.6 % — AB (ref 11.5–15.5)
WBC: 0.8 10*3/uL — CL (ref 4.0–10.5)
nRBC: 0 % (ref 0.0–0.2)

## 2018-01-27 LAB — COMPREHENSIVE METABOLIC PANEL
ALT: 52 U/L — ABNORMAL HIGH (ref 0–44)
AST: 39 U/L (ref 15–41)
Albumin: 2.8 g/dL — ABNORMAL LOW (ref 3.5–5.0)
Alkaline Phosphatase: 306 U/L — ABNORMAL HIGH (ref 38–126)
Anion gap: 5 (ref 5–15)
BUN: 18 mg/dL (ref 6–20)
CHLORIDE: 111 mmol/L (ref 98–111)
CO2: 23 mmol/L (ref 22–32)
Calcium: 7.9 mg/dL — ABNORMAL LOW (ref 8.9–10.3)
Creatinine, Ser: 0.7 mg/dL (ref 0.44–1.00)
GFR calc Af Amer: >60 mL/min (ref >60)
GFR calc non Af Amer: >60 mL/min (ref >60)
Glucose, Bld: 161 mg/dL — ABNORMAL HIGH (ref 70–99)
POTASSIUM: 3.2 mmol/L — AB (ref 3.5–5.1)
Sodium: 139 mmol/L (ref 135–145)
Total Bilirubin: 1.5 mg/dL — ABNORMAL HIGH (ref 0.3–1.2)
Total Protein: 5 g/dL — ABNORMAL LOW (ref 6.5–8.1)

## 2018-01-27 LAB — URIC ACID: Uric Acid, Serum: 4.1 mg/dL (ref 2.5–7.1)

## 2018-01-27 LAB — MAGNESIUM: Magnesium: 1.8 mg/dL (ref 1.7–2.4)

## 2018-01-27 LAB — LACTATE DEHYDROGENASE: LDH: 376 U/L — ABNORMAL HIGH (ref 98–192)

## 2018-01-27 MED ORDER — ONDANSETRON HCL 8 MG PO TABS: ORAL_TABLET | ORAL | 1 refills | Status: DC

## 2018-01-27 MED ORDER — HEPARIN SOD (PORK) LOCK FLUSH 100 UNIT/ML IV SOLN
250.0000 [IU] | Freq: Once | INTRAVENOUS | Status: AC
  Administered 2018-01-27: 250 [IU] via INTRAVENOUS

## 2018-01-27 MED ORDER — PREDNISONE 20 MG PO TABS: 100.0000 mg | ORAL_TABLET | Freq: Every day | ORAL | 0 refills | Status: DC

## 2018-01-27 MED ORDER — PROCHLORPERAZINE MALEATE 10 MG PO TABS: 10.0000 mg | ORAL_TABLET | Freq: Four times a day (QID) | ORAL | 6 refills | Status: DC | PRN

## 2018-01-27 MED ORDER — SODIUM CHLORIDE 0.9% FLUSH
3.0000 mL | INTRAVENOUS | Status: DC | PRN
  Administered 2018-01-27: 3 mL via INTRAVENOUS
  Filled 2018-01-27: qty 10

## 2018-01-27 MED ORDER — ONDANSETRON HCL 8 MG PO TABS: 8.0000 mg | ORAL_TABLET | Freq: Two times a day (BID) | ORAL | 1 refills | Status: DC | PRN

## 2018-01-27 NOTE — Progress Notes (Signed)
[place], [place] [address]   CLINIC:  Medical Oncology/Hematology  PCP:  Marinda Elk, [place] [address] [phone]   REASON FOR VISIT: Follow-up for Peripheral T cell Lymphoma.  CURRENT THERAPY: CHOEP    INTERVAL HISTORY:  Ms. Sinnett 58 y.o. 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 She is here today with her husband. She got her single lumen PICC line while she was in the hospital last week. She is ready to begin treatment this week. She does report bilateral leg edema and fatigue. Denies any nausea, vomiting, or diarrhea. Denies any new pains. Had not noticed any recent bleeding such as epistaxis, hematuria or hematochezia. Denies recent chest pain on exertion, shortness of breath on minimal exertion, pre-syncopal episodes, or palpitations. Denies any numbness or tingling in hands or feet. Denies any recent fevers, infections, or recent hospitalizations. She reports her appetite is 100% and her energy level is 75%.    REVIEW OF SYSTEMS:  Review of Systems  Cardiovascular: Positive for leg swelling.  All other systems reviewed and are negative.    PAST MEDICAL/SURGICAL HISTORY:  Past Medical History:  Diagnosis Date  . Diabetes ([place]) 01/20/2018  . HTN (hypertension)   . Hypothyroidism   . Leukemia [facility])    age 47   Past Surgical History:  Procedure Laterality Date  . COLONOSCOPY  07/27/2010   Procedure: COLONOSCOPY;  Surgeon: Dorothyann Peng, MD;  Location: [facility];  Service: Endoscopy;  Laterality: N/A;  . infertility surgery    . MOHS SURGERY     basal cell carcinoma  . REDUCTION MAMMAPLASTY Bilateral 2002     SOCIAL HISTORY:  Social History   Socioeconomic History  . Marital status: Married    Spouse name: Not on file  . Number of children: 0  . Years of education: Not on file  . Highest education level: Not on file    Occupational History  . Occupation: [employer]    Comment: Nurse  Social Needs  . Financial resource strain: Not on file  . Food insecurity:    Worry: Not on file    Inability: Not on file  . Transportation needs:    Medical: Not on file    Non-medical: Not on file  Tobacco Use  . Smoking status: Never Smoker  . Smokeless tobacco: Never Used  Substance and Sexual Activity  . Alcohol use: Yes    Comment: once or twice a year  . Drug use: No  . Sexual activity: Not on file  Lifestyle  . Physical activity:    Days per week: Not on file    Minutes per session: Not on file  . Stress: Not on file  Relationships  . Social connections:    Talks on phone: Not on file    Gets together: Not on file    Attends religious service: Not on file    Active member of club or organization: Not on file    Attends meetings of clubs or organizations: Not on file    Relationship status: Not on file  . Intimate partner violence:    Fear of current or ex partner: Not on file    Emotionally abused: Not on file    Physically abused: Not on file    Forced sexual activity: Not on file  Other Topics Concern  . Not on file  Social History  Narrative  . Not on file    FAMILY HISTORY:  Family History  Problem Relation Age of Onset  . Heart disease Mother   . Diabetes Father   . Breast cancer Sister 92  . Breast cancer Maternal Aunt   . Colon cancer Neg Hx   . Liver disease Neg Hx     CURRENT MEDICATIONS:  Outpatient Encounter Medications as of 01/27/2018  Medication Sig Note  . atorvastatin (LIPITOR) 20 MG tablet Take 1 tablet by mouth daily.   . calcium-vitamin D (CALCIUM 500+D HIGH POTENCY) 500-400 MG-UNIT tablet Take 1 tablet by mouth 2 (two) times daily.   . furosemide (LASIX) 20 MG tablet Take 1 tablet by mouth daily as needed. 01/19/2018: Has not needed in months.  . Glucosamine-Chondroit-Vit C-Mn (GLUCOSAMINE 1500 COMPLEX PO) Take 1 capsule by mouth 2 (two) times daily.    Marland Kitchen levothyroxine (SYNTHROID, LEVOTHROID) 75 MCG tablet Take 75 mcg by mouth daily before breakfast.    . triamterene-hydrochlorothiazide (MAXZIDE-25) 37.5-25 MG per tablet Take 1 tablet by mouth daily.     . valACYclovir (VALTREX) 500 MG tablet Take 1,000 mg by mouth 2 (two) times daily as needed.    . vitamin B-12 (CYANOCOBALAMIN) 1000 MCG tablet Take 1 tablet (1,000 mcg total) by mouth daily.   . vitamin C (ASCORBIC ACID) 500 MG tablet Take 500 mg by mouth 2 (two) times daily.   . Insulin Glargine-Lixisenatide (SOLIQUA) 100-33 UNT-MCG/ML SOPN Inject 56 Units into the skin daily. 01/19/2018: Has not used in over 2 weeks.  Marland Kitchen losartan (COZAAR) 100 MG tablet Take 100 mg by mouth daily.    . metFORMIN (GLUCOPHAGE) 1000 MG tablet Take 1 tablet by mouth 2 (two) times daily. 01/19/2018: Patient's sugar has been low for 2 to 3 weeks and has been taking '500mg'$  bid.   Facility-Administered Encounter Medications as of 01/27/2018  Medication  . [COMPLETED] heparin lock flush 100 unit/mL  . sodium chloride flush (NS) 0.9 % injection 3 mL    ALLERGIES:  Allergies  Allergen Reactions  . Doxycycline Anaphylaxis  . Penicillins Anaphylaxis    Has taken Keflex in past with no issues.  . Levofloxacin Nausea And Vomiting  . Sulfa Antibiotics      PHYSICAL EXAM:  ECOG Performance status: 1  Vitals:   01/27/18 1000  BP: 137/69  Pulse: 96  Resp: 16  Temp: 98.6 F (37 C)  SpO2: 100%   Filed Weights   01/27/18 1000  Weight: 139 lb 8 oz (63.3 kg)    Physical Exam Constitutional:      Appearance: Normal appearance. She is normal weight.  Musculoskeletal: Normal range of motion.  Skin:    General: Skin is warm and dry.  Neurological:     Mental Status: She is alert and oriented to person, place, and time. Mental status is at baseline.  Psychiatric:        Mood and Affect: Mood normal.        Behavior: Behavior normal.        Thought Content: Thought content normal.        Judgment: Judgment  normal.      LABORATORY DATA:  I have reviewed the labs as listed.  CBC    Component Value Date/Time   WBC 0.8 (LL) 01/27/2018 1146   RBC 2.63 (L) 01/27/2018 1146   HGB 7.6 (L) 01/27/2018 1146   HCT 24.2 (L) 01/27/2018 1146   HCT 21.1 (L) 01/19/2018 0747   PLT 22 (LL) 01/27/2018  1146   MCV 92.0 01/27/2018 1146   MCH 28.9 01/27/2018 1146   MCHC 31.4 01/27/2018 1146   RDW 17.6 (H) 01/27/2018 1146   LYMPHSABS 0.6 (L) 01/27/2018 1146   MONOABS 0.0 (L) 01/27/2018 1146   EOSABS 0.0 01/27/2018 1146   BASOSABS 0.0 01/27/2018 1146   CMP Latest Ref Rng & Units 01/27/2018 01/24/2018 01/23/2018  Glucose 70 - 99 mg/dL 161(H) 110(H) 100(H)  BUN 6 - 20 mg/dL '18 16 16  '$ Creatinine 0.44 - 1.00 mg/dL 0.70 0.72 0.68  Sodium 135 - 145 mmol/L 139 140 141  Potassium 3.5 - 5.1 mmol/L 3.2(L) 3.4(L) 3.5  Chloride 98 - 111 mmol/L 111 116(H) 119(H)  CO2 22 - 32 mmol/L 23 21(L) 20(L)  Calcium 8.9 - 10.3 mg/dL 7.9(L) 7.9(L) 7.7(L)  Total Protein 6.5 - 8.1 g/dL 5.0(L) 4.4(L) 4.3(L)  Total Bilirubin 0.3 - 1.2 mg/dL 1.5(H) 1.2 1.2  Alkaline Phos 38 - 126 U/L 306(H) 353(H) 388(H)  AST 15 - 41 U/L 39 36 38  ALT 0 - 44 U/L 52(H) 61(H) 74(H)       DIAGNOSTIC IMAGING:  I have independently reviewed the scans and discussed with the patient.   I have reviewed Francene Finders, NP's note and agree with the documentation.  I personally performed a face-to-face visit, made revisions and my assessment and plan is as follows.    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  PTCL, NOS: -Presentation with pancytopenia, night sweats and fevers for last 1 to 2 months. - CT CAP shows splenomegaly with no lymphadenopathy. 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  T cells do not express TDT, CD1a, CD10, CD15, CD30, CD34, CD56, CD 117 or CD99.  Immunophenotype is nonspecific, but compatible with  mature T-cell neoplasm. - Prognostic index for PTCL (PIT) has 2 positive factors including serum LDH above normal and bone marrow involvement. - She is not a candidate for brentuximab-based regimen as CD30 was negative. -We talked about first-line therapy with CHOP-based regimen.  I have also talked to her about CHOEP regimen which has shown to be helpful for patients less than 60 and normal LDH. - We will obtain a baseline PET CT scan. - She has a PICC line placed for chemotherapy administration. -2D echo on 01/24/2018 shows ejection fraction of 65 to 70%. -We talked about side effects of CHOEP-based regimen including but not limited to alopecia, allergic reactions, bone marrow suppression, life-threatening infections, rare chance of congestive heart failure, among others.  She understands and gives [place] permission to proceed with the treatment.  We also talked about 5-year survival rates of around 25% with this regimen.  I have also recommended stem cell transplant to improve the 5-year survival rate. -We will also start her on prophylactic antibiotics because of her severe neutropenia. -We will closely monitor her blood counts weekly to see if she needs any transfusions. -I will also contact Dr[facility]Rizzieri at [facility] for consultation regarding bone marrow transplant in first remission.  Total time spent is 40 minutes with more than 50% of the time spent face-to-face discussing diagnosis, treatment options, side effects and coordination of care.    Orders placed this encounter:  Orders Placed This Encounter  Procedures  . Lactate dehydrogenase  . Magnesium  . CBC with Differential/Platelet  . Comprehensive metabolic panel  . Lactate dehydrogenase  . Uric acid  Derek Jack, MD [place] [phone]

## 2018-01-27 NOTE — Progress Notes (Signed)
Print outs given to patient on cytoxan, adriamycin, vincristine, etoposide and neulasta. Thermometer given to patient as Dr. Delton Coombes discussed importance of fevers with her. I discussed general chemotherapy information as well as precautions to take.  We talked about general things to do on the first day of chemotherapy treatment.  Patient and husband were given the opportunity to ask questions and all were answered to their satisfaction.

## 2018-01-27 NOTE — Progress Notes (Signed)
Chemotherapy teaching packet pulled together. 

## 2018-01-27 NOTE — Progress Notes (Signed)
START ON PATHWAY REGIMEN - Lymphoma and CLL     A cycle is every 21 days:     Cyclophosphamide      Doxorubicin      Vincristine      Etoposide      Prednisone   **Always confirm dose/schedule in your pharmacy ordering system**  Patient Characteristics: T-Cell Lymphoma, First Line, AITL (Angioimmunoblastic T-Cell Lymphoma) or ALCL (Anaplastic Large Cell Lymphoma) or Peripheral T-Cell, NOS, CD30 Negative Disease Type: Not Applicable Disease Type: T-Cell Lymphoma Disease Type: Not Applicable Line of Therapy: First Line [place] Stage: IV T-Cell Lymphoma Subtype: Peripheral T-Cell Lymphoma, NOS CD30 Status: CD30 Negative Intent of Therapy: Curative Intent, Discussed with Patient

## 2018-01-27 NOTE — Progress Notes (Signed)
Kristin Good tolerated PICC line lab draw with flush well without complaints or incident. Blood obtained for labs ordered then flushed with 3 ml NS and 2.5 ml Heparin easily per protocol. Pt discharged self ambulatory in satisfactory condition accompanied by family member

## 2018-01-27 NOTE — Assessment & Plan Note (Addendum)
1.  PTCL, NOS: -Presentation with pancytopenia, night sweats and fevers for last 1 to 2 months. - CT CAP shows splenomegaly with no lymphadenopathy. 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  T cells do not express TDT, CD1a, CD10, CD15, CD30, CD34, CD56, CD 117 or CD99.  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 - Prognostic index for PTCL (PIT) has 2 positive factors including serum LDH above normal and bone marrow involvement. - She is not a candidate for brentuximab-based regimen as CD30 was negative. -We talked about first-line therapy with CHOP-based regimen.  I have also talked to her about CHOEP regimen which has shown to be helpful for patients less than 60 and normal LDH. - We will obtain a baseline PET CT scan. - She has a PICC line placed for chemotherapy administration. -2D echo on 01/24/2018 shows ejection fraction of 65 to 70%. -We talked about side effects of CHOEP-based regimen including but not limited to alopecia, allergic reactions, bone marrow suppression, life-threatening infections, rare chance of congestive heart failure, among others.  She understands and gives [place] permission to proceed with the treatment.  We also talked about 5-year survival rates of around 25% with this regimen.  I have also recommended stem cell transplant to improve the 5-year survival rate. -We will also start her on prophylactic antibiotics because of her severe neutropenia. -We will closely monitor her blood counts weekly to see if she needs any transfusions. -I will also contact Dr.Rizzieri at [facility] for consultation regarding bone marrow transplant in first remission.

## 2018-01-27 NOTE — Patient Instructions (Signed)
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 We are going to be treating you with curative intent. We will be treating you with CHOP regimen + etoposide.  That includes Cytoxan (cyclophosphamide), Adriamycin (Doxorubicin), Oncovin (Vincristine) Prednisone and Vepesid (Etoposide).  These will be given on day 1 every three weeks and you will also receive etoposide on days 2 and 3 every 3 weeks.  They will be given through your PICC line.  You will see the doctor regularly throughout treatment.  We monitor your lab work prior to every treatment. The doctor monitors your response to treatment by the way you are feeling, your blood work, and scans periodically.  There will be wait times while you are here for treatment.  It will take about 30 minutes to 1 hour for your lab work to result.  Then there will be wait times while pharmacy mixes your medications.   You need to be sure to take your prednisone for 5 days starting on the first day of chemotherapy every 21 days Take the Acyclovir daily to prevent viral infections Take the Ciprofloxacin daily to prevent any bacterial infections Take the Diflucan daily to prevent any fungal infections   You will receive the following premedications prior to treatment:  Aloxi - high powered nausea medication used to treat chemotherapy induced nausea Emend - high powered nausea medication used to treat chemotherapy induced nausea Dexamethasone - steroid given to help reduce incidence of allergic reaction to chemotherapy as well as helps to reduce nausea  Cyclophosphamide (Cytoxan)  About This Drug Cyclophosphamide is used to treat cancer. It is given orally (by mouth) and in the vein (IV).  Possible Side Effects  A decrease in the number of white blood cells which may raise your risk of infection  Fever and fever in the setting of decreased white blood cells, which is a serious condition that  can be life-threatening  Infection. Severe infections, including viral, bacterial and fungal, which can be life-threatening.  Nausea and vomiting (throwing up)  Diarrhea (loose bowel movements)  Hair loss. Hair loss is often temporary, although with certain medicine, hair loss can sometimes be permanent. Hair loss may happen suddenly or gradually. If you lose hair, you may lose it from your head, face, armpits, pubic area, chest, and/or legs. You may also notice your hair getting thin.  Note: Not all possible side effects are included above.  Warnings and Precautions  Severe bone marrow suppression, which can be life-threatening. This is a decrease in the number of white blood cells, red blood cells, and platelets. This may raise your risk of infection, make you tired and weak (fatigue), and raise your risk of bleeding.  Abnormal heart beat and/or changes in the tissue of the heart, which can be life-threatening. Some changes may happen that can cause your heart to have less ability to pump blood.  Effects on the bladder and kidneys that may be life-threatening. This drug may cause inflammation (swelling), irritation and bleeding in the bladder and/or kidneys. You may have blood in your urine.  Changes in your liver function and blockage of small veins in the liver, which can cause liver failure and be life-threatening.  Inflammation (swelling) of the lungs, and changes to the small vessels of your lungs. You may have a dry cough or trouble breathing.  This drug may cause slow wound healing  Electrolyte changes, especially a decrease in sodium which can be life-threatening  This drug  may raise your risk of getting a second cancer  These side effects may be more severe if you are receiving high doses of this medication included in pre-transplant chemotherapy.  Note: Some of the side effects above are very rare. If you have concerns and/or questions, please discuss them with  your medical team.  Important Information  Your doctor may prescribe you to drink extra fluids after your treatment to flush your bladder and urinate often to help decrease the risk of the effects on your bladder.  If you must have emergency surgery or have an accident that results in a wound, tell the doctor that you are on cyclophosphamide.  How to Take Your Medication  For Oral Only: Swallow the medicine whole with or without food. Do not chew, break or crush it. Do not touch a broken or crushed tablet. Do not take the medicine at bedtime.  Missed dose: If you miss a dose, do not take 2 doses at the same time or extra doses. Call your doctor or nurse for further instructions.  Handling: Wash your hands after handling your medicine, your caretakers should not handle your medicine with bare hands and should wear latex gloves.  This drug may be present in the saliva, tears, sweat, urine, stool, vomit, semen, and vaginal secretions. Talk to your doctor and/or your nurse about the necessary precautions to take during this time.  If you get any of the content of a broken capsules on your skin, you should wash the area of the skin well with soap and water right away. Call your doctor if you get a skin reaction.  Storage: Store this medicine in the original container at room temperature. Do not store at temperature above 30C (70F).  Disposal of unused oral medicine: Do not flush any expired and/or unused medicine down the toilet or drain unless you are specifically instructed to do so on the medication label. Some facilities have take-back programs and/or other options. If you do not have a take-back program in your area, then please discuss with your nurse or your doctor how to dispose of unused medicine.  Treating Side Effects  Manage tiredness by pacing your activities for the day.  Be sure to include periods of rest between energy-draining activities.  To decrease the risk  of infection, wash your hands regularly.  Avoid close contact with people who have a cold, the flu, or other infections.  Take your temperature as your doctor or nurse tells you, and whenever you feel like you may have a fever.  To help decrease the risk of bleeding, use a soft toothbrush. Check with your nurse before using dental floss.  Be very careful when using knives or tools.  Use an electric shaver instead of a razor.  Keeping your pain under control is important to your well-being. Please tell your doctor or nurse if you are experiencing pain.  Drink plenty of fluids (a minimum of eight glasses per day is recommended).  If you throw up or have loose bowel movements, you should drink more fluids so that you do not become dehydrated (lack of water in the body from losing too much fluid).  If you have diarrhea, eat low-fiber foods that are high in protein and calories and avoid foods that can irritate your digestive tracts or lead to cramping.  Ask your nurse or doctor about medicine that can lessen or stop your diarrhea.  To help with nausea and vomiting, eat small, frequent meals instead of three  large meals a day. Choose foods and drinks that are at room temperature. Ask your nurse or doctor about other helpful tips and medicine that is available to help stop or lessen these symptoms.  To help with hair loss, wash with a mild shampoo and avoid washing your hair every day.  Avoid rubbing your scalp, pat your hair or scalp dry.  Avoid coloring your hair.  Limit your use of hair spray, electric curlers, blow dryers, and curling irons.  If you are interested in getting a wig, talk to your nurse. You can also call the [facility] at 800-ACS-2345 to find out information about the Look Good, Feel Better program close to where you live. It is a free program where women getting chemotherapy can learn about wigs, turbans and scarves as well as makeup techniques  and skin and nail care.  Food and Drug Interactions  There are no known interactions of cyclophosphamide with food.  Check with your doctor or pharmacist about all other prescription medicines and over-the-counter medicines and dietary supplements (vitamins, minerals, herbs and others) you are taking before starting this medicine as there are known drug interactions with cyclophosphamide Also, check with your doctor or pharmacist before starting any new prescription or over-the-counter medicines, or dietary supplement to make sure that there are no interactions.  When to Call the Doctor Call your doctor or nurse if you have any of these symptoms and/or any new or unusual symptoms:  Fever of 100.4 F (38 C) or higher  Chills  Tiredness that interferes with your daily activities  Feeling dizzy or lightheaded  Easy bleeding or bruising  Dry cough  Trouble breathing  Swelling of arms, hands, legs, and/or feet  Weight gain of 5 pounds in one week (fluid retention)  Feeling that your heart is beating in a fast or not normal way (palpitations)  Pain in your chest  Nausea that stops you from eating or drinking and/or is not relieved by prescribed medicines  Throwing up more than 3 times a day  Diarrhea, 4 times in one day or diarrhea with lack of strength or a feeling of being dizzy  Decreased urine, or very dark urine  Pain when passing urine; blood in urine  Decreased urine or difficulty urinating  Signs of possible liver problems: dark urine, pale bowel movements, bad stomach pain, feeling very tired and weak, unusual itching, or yellowing of the eyes or skin  If you think you may be pregnant or may have impregnated your partner  Reproduction Warnings  Pregnancy warning: This drug can have harmful effects on the unborn baby. Women of childbearing potential should use highly effective methods of birth control during your cancer treatment and for 1 year after  treatment. Men with female partners who are pregnant or may become pregnant should use a condom during your cancer treatment and for at least 4 months after your cancer treatment. Let your doctor know right away if you think you may be pregnant or may have impregnated your partner.  In women, menstrual bleeding may become irregular or stop while you are getting this drug. Do not assume that you cannot become pregnant if you do not have a menstrual period.  Breastfeeding warning: This drug passes into breast milk. For this reason, women should talk to their doctor about the risks and benefits of breastfeeding during treatment with this drug because this drug may enter the breast milk and cause harm to a breastfeeding baby.  Fertility warning: In  men and women both, this drug may affect your ability to have children in the future. Talk with your doctor or nurse if you plan to have children. Ask for information on sperm or egg banking.  Doxorubicin (Adriamycin)  About This Drug Doxorubicin is used to treat cancer. It is given in the vein (IV).  Possible Side Effects  Bone marrow suppression. This is a decrease in the number of white blood cells, red blood cells, and platelets. This may raise your risk of infection, make you tired and weak (fatigue), and raise your risk of bleeding.  Fever in the setting of decreased white blood cells, which is a serious condition that can be lifethreatening  Blurred vision or other changes in eyesight  Nausea and vomiting (throwing up)  Diarrhea (loose bowel movements)  Decreased appetite (decreased hunger)  Soreness of the mouth and throat. You may have red areas, white patches, or sores that hurt.  Pain in your abdomen  Tiredness and weakness  General discomfort, a feeling of being unwell  Allergic reactions, including anaphylaxis are rare but may happen in some patients. Signs of allergic reaction to this drug may be swelling of the  face, feeling like your tongue or throat are swelling, trouble breathing, rash, itching, fever, chills, feeling dizzy, and/or feeling that your heart is beating in a fast or not normal way. If this happens, do not take another dose of this drug. You should get urgent medical treatment.  In women, menstrual bleeding may become irregular or stop while you are getting this drug. Do not assume that you cannot become pregnant if you do not have a menstrual period.  Women may go through signs of menopause (change of life) like vaginal dryness or itching.  Hair loss. Hair loss is often temporary, although with certain medicine, hair loss can sometimes be permanent. Hair loss may happen suddenly or gradually. If you lose hair, you may lose it from your head, face, armpits, pubic area, chest, and/or legs. You may also notice your hair getting thin.  Changes in your nail color, nail loss and/or brittle nail  If you have received radiation treatments, your skin may become red after doxorubicin. This reaction is called radiation recall. Your body is recalling, or remembering, that it had radiation therapy.  Note: Not all possible side effects are included above.  Warnings and Precautions  Skin and tissue irritation including redness, pain, warmth, or swelling at the IV site if the drug leaks out of the vein and into nearby tissue. Very rarely it may cause tissue necrosis (death).  Changes in the tissue of the heart and/or congestive heart failure. You may be short of breath. Your arms, hands, legs and feet may swell.  This drug may raise your risk of getting a second cancer, such as leukemia and myelodysplastic syndrome  Severe bone marrow suppression  Note: Some of the side effects above are very rare. If you have concerns and/or questions, please discuss them with your medical team.  Important Information  This drug may be present in the saliva, tears, sweat, urine, stool, vomit,  semen, and vaginal secretions. Talk to your doctor and/or your nurse about the necessary precautions to take during this time.  Talk to your doctor before receiving any vaccinations during your treatment. Some vaccinations are not recommended while receiving doxorubicin.  Urine color may be slightly orange or reddish starting several hours after you get this drug. This will slowly go away within one to two days.  Treating Side Effects  Manage tiredness by pacing your activities for the day.  Be sure to include periods of rest between energy-draining activities.  To decrease the risk of infection, wash your hands regularly.  Avoid close contact with people who have a cold, the flu, or other infections.  Take your temperature as your doctor or nurse tells you, and whenever you feel like you may have a fever.  To help decrease the risk of bleeding, use a soft toothbrush. Check with your nurse before using dental floss.  Be very careful when using knives or tools.  Use an electric shaver instead of a razor.  Drink plenty of fluids (a minimum of eight glasses per day is recommended).  If you throw up or have loose bowel movements, you should drink more fluids so that you do not become dehydrated (lack of water in the body from losing too much fluid).  If you have diarrhea, eat low-fiber foods that are high in protein and calories and avoid foods that can irritate your digestive tracts or lead to cramping.  Ask your nurse or doctor about medicine that can lessen or stop your diarrhea.  To help with nausea and vomiting, eat small, frequent meals instead of three large meals a day. Choose foods and drinks that are at room temperature. Ask your nurse or doctor about other helpful tips and medicine that is available to help stop or lessen these symptoms.  Mouth care is very important. Your mouth care should consist of routine, gentle cleaning of your teeth or dentures and rinsing  your mouth with a mixture of 1/2 teaspoon of salt in 8 ounces of water or 1/2 teaspoon of baking soda in 8 ounces of water. This should be done at least after each meal and at bedtime.  If you have mouth sores, avoid mouthwash that has alcohol. Also avoid alcohol and smoking because they can bother your mouth and throat.  To help with decreased appetite, eat small, frequent meals.  Eat foods high in calories and protein, such as meat, poultry, fish, dry beans, tofu, eggs, nuts, milk, yogurt, cheese, ice cream, pudding, and nutritional supplements.  Consider using sauces and spices to increase taste. Daily exercise, with your doctors approval, may increase your appetite.  Keeping your pain under control is important to your well-being. Please tell your doctor or nurse if you are experiencing pain.  To help with possible signs of early menopause, vaginal lubricants can be used to lessen vaginal dryness, itching, and pain during sexual relations.  To help with hair loss, wash with a mild shampoo and avoid washing your hair every day.  Avoid rubbing your scalp, pat your hair or scalp dry.  Avoid coloring your hair.  Limit your use of hair spray, electric curlers, blow dryers, and curling irons.  If you are interested in getting a wig, talk to your nurse. You can also call the [facility] at 800-ACS-2345 to find out information about the Look Good, Feel Better program close to where you live. It is a free program where women getting chemotherapy can learn about wigs, turbans and scarves as well as makeup techniques and skin and nail care.  Keeping your nails moisturized may help with brittleness.  If you received radiation, and your skin becomes red or irritated again, follow the same care instructions you did during radiation treatment. Be sure to tell the nurse or doctor administering your chemotherapy about your skin changes.  Food and Drug Interactions  There  are no known interactions of doxorubicin with food.  Check with your doctor or pharmacist about all other prescription medicines and over-the-counter medicines and dietary supplements (vitamins, minerals, herbs and others) you are taking before starting this medicine as there are known drug interactions with doxorubicin. Also, check with your doctor or pharmacist before starting any new prescription or over-the-counter medicines, or dietary supplements to make sure that there are no interactions.  When to Call the Doctor Call your doctor or nurse if you have any of these symptoms and/or any new or unusual symptoms:  Fever of 100.4 F (38 C) or higher  Chills  Blurred vision or other changes in eyesight  Feeling dizzy or lightheaded  Trouble breathing  Feeling that your heart is beating fast or in a not normal way (palpitations)  Tiredness that interferes with your daily activities  Easy bleeding or bruising  Loose bowel movements (diarrhea) 4 times a day or loose bowel movements with lack of strength or a feeling of being dizzy  Pain in your mouth or throat that makes it hard to eat or drink  Pain in your abdomen that does not go away  Nausea that stops you from eating or drinking and/or is not relieved by prescribed medicines  Throwing up more than 3 times a day  Lasting loss of appetite or rapid weight loss of five pounds in a week  Swelling of legs, ankles, or feet  Signs of inflammation/infection (redness, swelling, pain) of the tissue around your nails.  Weight gain of 5 pounds in one week (fluid retention)  Signs of allergic reaction: swelling of the face, feeling like your tongue or throat are swelling, trouble breathing, rash, itching, fever, chills, feeling dizzy, and/or feeling that your heart is beating in a fast or not normal way. If this happens, call 911 for emergency care.  While you are getting this drug, please tell your nurse right away if you have  any pain, redness, or swelling at the site of the IV infusion  If you think you may be pregnant or may have impregnated your partner  Reproduction Warnings  Pregnancy warning: This drug can have harmful effects on the unborn baby. Women of childbearing potential and men with female partners of childbearing potential should use effective methods of birth control during your cancer treatment. Let your doctor know right away if you think you may be pregnant or may have impregnated your partner  Breastfeeding warning: Women should not breastfeed during treatment because this drug could enter the breast milk and cause harm to a breastfeeding baby. Women should talk to their doctor about the risks and benefits of breastfeeding during treatment with this drug.  Fertility warning: In men and women both, this drug may affect your ability to have children in the future. Talk with your doctor or nurse if you plan to have children. Ask for information on sperm or egg banking.   Etoposide (Toposar, VePesid, VP-16)  About This Drug Etoposide is used to treat cancer. It is given in the vein (IV) and orally (by mouth).  Possible Side Effects  Bone marrow suppression. This is a decrease in the number of white blood cells, red blood cells, and platelets. This may raise your risk of infection, make you tired and weak (fatigue), and raise your risk of bleeding.  Nausea and vomiting (throwing up)  Hair loss. Hair loss is often temporary, although with certain medicine, hair loss can sometimes be permanent. Hair loss may happen  suddenly or gradually. If you lose hair, you may lose it from your head, face, armpits, pubic area, chest, and/or legs. You may also notice your hair getting thin.  Note: Each of the side effects above was reported in 20% or greater of patients treated with etoposide. Not all possible side effects are included above.  Warnings and Precautions  Severe bone marrow  suppression, which may be life-threatening  This drug may raise your risk of getting a second cancer, such as leukemia  Low blood pressure with rapid infusion of the medication  Allergic reactions, including anaphylaxis are rare but may happen in some patients. Signs of allergic reaction to this drug may be swelling of the face, feeling like your tongue or throat are swelling, trouble breathing, rash, itching, fever, chills, feeling dizzy, and/or feeling that your heart is beating in a fast or not normal way. If this happens, do not take another dose of this drug. You should get urgent medical treatment.  These side effects may be more severe if you are receiving high doses of this medication included in pre-transplant chemotherapy.  How to Take Your Medication  For Oral: Swallow the medicine as prescribed by your doctor.  Missed dose: If you vomit or miss a dose, contact your doctor for further instructions. Do not take 2 doses at the same time.  Handling: Wash your hands after handling your medicine, your caretakers should not handle your medicine with bare hands and should wear latex gloves.  If any of the capsules are broken, do not touch them with bare hands. Carefully throw away the capsules and wash your hands after handling.  If you get any of the content of a broken capsules on your skin or in your eyes, you should wash the area of the skin well with soap and water right away. Call your doctor if you get a skin reaction.  This drug may be present in the saliva, tears, sweat, urine, stool, vomit, semen, and vaginal secretions. Talk to your doctor and/or your nurse about the necessary precautions to take during this time.  Storage: Store this medicine in the refrigerator, between 45F to 4F (2C to [place]). Do not freeze.  Disposal of unused medicine: Do not flush any expired and/or unused medicine down the toilet or drain unless you are specifically instructed to do so on  the medication label. Some facilities have take-back programs and/or other options. If you do not have a take-back program in your area, then please discuss with your nurse or your doctor how to dispose of unused medicine.  Treating Side Effects  Manage tiredness by pacing your activities for the day.  Be sure to include periods of rest between energy-draining activities.  To decrease the risk of infection, wash your hands regularly.  Avoid close contact with people who have a cold, the flu, or other infections.  Take your temperature as your doctor or nurse tells you, and whenever you feel like you may have a fever.  To help decrease bleeding, use a soft toothbrush. Check with your nurse before using dental floss.  Be very careful when using knives or tools.  Use an electric shaver instead of a razor.  Drink plenty of fluids (a minimum of eight glasses per day is recommended).  If you throw up or have loose bowel movements, you should drink more fluids so that you do not become dehydrated (lack of water in the body from losing too much fluid).  To help with nausea  and vomiting, eat small, frequent meals instead of three large meals a day. Choose foods and drinks that are at room temperature. Ask your nurse or doctor about other helpful tips and medicine that is available to help or stop lessen these symptoms.  To help with hair loss, wash with a mild shampoo and avoid washing your hair every day.  Avoid rubbing your scalp, pat your hair or scalp dry.  Avoid coloring your hair.  Limit your use of hair spray, electric curlers, blow dryers, and curling irons.  If you are interested in getting a wig, talk to your nurse. You can also call the [place] [facility] at 800-ACS-2345 to find out information about the Look Good, Feel Better program close to where you live. It is a free program where women getting chemotherapy can learn about wigs, turbans and scarves as well as  makeup techniques and skin and nail care.  Food and Drug Interactions  There are no known interactions of etoposide with food.  This drug may interact with other medicines. Tell your doctor and pharmacist about all the medicines and dietary supplements (vitamins, minerals, herbs and others) that you are taking at this time. The safety and use of dietary supplements and alternative diets are often not known. Using these might affect your cancer or interfere with your treatment. Until more is known, you should not use dietary supplements or alternative diets without your cancer doctor's help.  There are known interactions of etoposide with blood thinning medicine such as warfarin. Ask your doctor what precautions you should take.  When to Call the Doctor Call your doctor or nurse if you have any of these symptoms and/or any new or unusual symptoms:  Fever of 100.4 F (38 C) or higher  Chills  Tiredness that interferes with your daily activities  Feeling dizzy or lightheaded  Feeling that your heart is beating in a fast or not normal way (palpitations)  Easy bleeding or bruising  Nausea that stops you from eating or drinking and/or is not relieved by prescribed medicines  Throwing up more than 3 times a day  Signs of allergic reaction: swelling of the face, feeling like your tongue or throat are swelling, trouble breathing, rash, itching, fever, chills, feeling dizzy, and/or feeling that your heart is beating in a fast or not normal way  If you think you may be pregnant or may have impregnated your partner  Reproduction Warnings  Pregnancy warning: This drug can have harmful effects on the unborn baby. Women of childbearing potential should use effective methods of birth control during your cancer treatment and for at least 6 months after treatment. Men with female partners of childbearing potential should use effective methods of birth control during your cancer treatment  and for at least 4 months after your cancer treatment. Let your doctor know right away if you think you may be pregnant or may have impregnated your partner.  Breastfeeding warning: It is not known if this drug passes into breast milk. For this reason, women should talk to their doctor about the risks and benefits of breastfeeding during treatment with this drug because this drug may enter the breast milk and cause harm to a breastfeeding baby.  Fertility warning: In men and women both, this drug may affect your ability to have children in the future. Talk with your doctor or nurse if you plan to have children. Ask for information on sperm or egg banking.  Prednisone  About This Drug Prednisone is  a steroid that may be used to treat cancer. It is given orally (by mouth).  Possible Side Effects  Abnormal heart beat  Headache  High blood pressure  Increased sweating  Blurred vision or other changes in eyesight  Tiredness and weakness  Changes in mood, which may include depression or a feeling of extreme well-being  Trouble sleeping  Feeling restless (unable to relax)  Skin changes such as rash, dryness, or redness  Increased appetite (increased hunger)  Weight gain  Electrolyte changes  Increased risk of infections  Aggravation of stomach ulcers  Pain in your abdomen  Nausea  Blood sugar levels may change  Swelling of your legs, ankles and/or feet  Muscle loss and/or weakness (lack of muscle strength)  Increased risk of developing osteoporosis- your bones may become weak and brittle  Increased risk for cataracts, glaucoma or infections of the eye  Changes in your liver function  Note: Not all possible side effects are included above.  Warnings and Precautions  High blood pressure and changes in electrolytes, which can cause fluid build-up around your heart, lungs or elsewhere.  Severe infections, which can be life-threatening.  Allergic  reactions, including anaphylaxis are rare but may happen in some patients. Signs of allergic reaction to this drug may be swelling of the face, feeling like your tongue or throat are swelling, trouble breathing, rash, itching, fever, chills, feeling dizzy, and/or feeling that your heart is beating in a fast or not normal way. If this happens, do not take another dose of this drug. You should get urgent medical treatment.  Increased risk of developing a hole in your stomach, small, and/or large intestine if you have ulcers in the lining of your stomach and/or intestine, or have diverticulitis, ulcerative colitis and/or other diseases that affect the gastrointestinal tract.  Blurred vision or other changes in eyesight.  Severe depression and other psychiatric disorders such as mood changes.  Effects on the endocrine glands including pituitary, adrenals and thyroid during or after use of this medication.  Note: Some of the side effects above are very rare. If you have concerns and/or questions, please discuss them with your medical team.  Important Information  Talk to your doctor or your nurse before stopping this medication, it should be stopped gradually. Depending on the dose and length of treatment, you could experience serious side effects if stopped abruptly (suddenly).  Talk to your doctor before receiving any vaccinations during your treatment. Some vaccinations are not recommended while receiving prednisone.  How to Take Your Medication  For Oral (by mouth): You can take the medicine with or without food, but it is preferable to be taken with food, especially If you have nausea or upset stomach.  Missed dose: If you miss or vomit a dose, contact your physician. Do not take 2 doses at the same time and do not double up on the next dose.  Handling: Wash your hands after handling your medicine, your caretakers should not handle your medicine with bare hands and should wear  latex gloves.  Storage: Store this medicine in the original container at room temperature, protect from moisture. Discuss with your nurse or your doctor how to dispose of unused medicine.  Treating Side Effects  Drink plenty of fluids (a minimum of eight glasses per day is recommended).  To help with nausea and vomiting, eat small, frequent meals instead of three large meals a day. Choose foods and drinks that are at room temperature. Ask your nurse or doctor  about other helpful tips and medicine that is available to help stop or lessen these symptoms.  If you throw up, you should drink more fluids so that you do not become dehydrated (lack of water in the body from losing too much fluid).  Manage tiredness by pacing your activities for the day. Be sure to include periods of rest between energy-draining activities.  To help with muscle weakness, get regular exercise. If you feel too tired to exercise vigorously, try taking a short walk.  If you are having trouble sleeping, talk to your nurse or doctor on tips to help you sleep better.  If you are feeling depressed, talk to your nurse or doctor about it.  Keeping your pain under control is important to your well-being. Please tell your doctor or nurse if you are experiencing pain.  If you have diabetes, keep good control of your blood sugar level. Tell your nurse or your doctor if your glucose levels are higher or lower than normal.  To decrease the risk of infection, wash your hands regularly.  Avoid close contact with people who have a cold, the flu, or other infections.  Take your temperature as your doctor or nurse tells you, and whenever you feel like you may have a fever.  If you get a rash do not put anything on it unless your doctor or nurse says you may. Keep the area around the rash clean and dry. Ask your doctor for medicine if your rash bothers you.  Moisturize your skin several times a day  Avoid sun exposure  and apply sunscreen routinely when outdoors.  Food and Drug Interactions  This drug may interact with grapefruit and grapefruit juice. Talk to your doctor as this could make side effects worse.  Check with your doctor or pharmacist about all other prescription medicines and over-the-counter medicines and dietary supplements (vitamins, minerals, herbs and others) you are taking before starting this medicine as there are known drug interactions with prednisone. Also, check with your doctor or pharmacist before starting any new prescription or over-the-counter medicines, or dietary supplements to make sure that there are no interactions.  There are known interactions of prednisone with other medicines and products like aspirin, and other nonsteroidal anti-inflammatory agents. Ask your doctor what over-the-counter (OTC) medicines you can take.  There are known interactions of prednisone with blood thinning medicine such as warfarin. Ask your doctor what precautions you should take.  Avoid the use of [place] while taking prednisone as this may lower the levels of the drug in your body, which can make it less effective.  When to Call the Doctor Call your doctor or nurse if you have any of these symptoms and/or any new or unusual symptoms:  Fever of 100.4 F (38 C) or higher  Chills  A headache that does not go away  Blurry vision or other changes in eyesight  Feel irritable, nervous or restless  Trouble sleeping  Tiredness or weakness that interferes with your daily activities  Trouble breathing  Feeling that your heart is beating in a fast or not normal way (palpitations)  Chest pain or symptoms of a heart attack. Most heart attacks involve pain in the center of the chest that lasts more than a few minutes. The pain may go away and come back or it can be constant. It can feel like pressure, squeezing, fullness, or pain. Sometimes pain is felt in one or both arms,  the back, neck, jaw, or stomach. If  any of these symptoms last 2 minutes, call 911.  Nausea that stops you from eating or drinking and/or is not relieved by prescribed medicines  Throwing up more than 3 times a day  Severe abdominal pain that does not go away  Heartburn or indigestion  Abnormal blood sugar  Severe mood changes such as depression or unusual thoughts and/or behaviors  Thoughts of hurting yourself or others, and suicide  Feeling hopeless on most days  Unusual thirst, passing urine often, headache, sweating, shakiness, irritability  Swelling of legs, ankles, or feet  Weight gain of 5 pounds in one week (fluid retention)  A new rash or a rash that is not relieved by prescribed medicines  Signs of possible liver problems: dark urine, pale bowel movements, bad stomach pain, feeling very tired and weak, unusual itching, or yellowing of the eyes or skin  Signs of allergic reaction: swelling of the face, feeling like your tongue or throat are swelling, trouble breathing, rash, itching, fever, chills, feeling dizzy, and/or feeling that your heart is beating in a fast or not normal way. If this happens, call 911 for emergency care.  If you think you may be pregnant  Reproduction Warnings  Pregnancy warning: It is not known if this drug may harm an unborn child. For this reason, be sure to talk with your doctor if you are pregnant or planning to become pregnant while receiving this drug. Let your doctor know right away if you think you may be pregnant.  Breastfeeding warning: It is not known if this drug passes into breast milk. For this reason, women should talk to their doctor about the risks and benefits of breastfeeding during treatment with this drug because this drug may enter the breast milk and cause harm to a breastfeeding baby.  Fertility warning: Human fertility studies have not been done with this drug. Talk with your doctor or nurse if you plan to have  children. Ask for information on sperm or egg banking.  Vincristine (Oncovin)  About This Drug Vincristine is used to treat cancer. It is given in the vein (IV)  Possible Side Effects  Changes to your heart function including heart attack may rarely occur  Nausea and vomiting (throwing up)  Diarrhea (loose bowel movements)  Constipation (not able to move bowels)  Weight loss  Swelling of legs, ankles and/or feet  Feeling dizzy  Headache  Pain in your abdomen  Effects on the nerves are called peripheral neuropathy. You may feel numbness, tingling, or pain in your hands and feet. It may be hard for you to button your clothes, open jars, or walk as usual. The effect on the nerves may get worse with more doses of the drug. These effects get better in some people after the drug is stopped but it does not get better in all people.  Hair loss. Hair loss is often temporary, although with certain medicine, hair loss can sometimes be permanent. Hair loss may happen suddenly or gradually. If you lose hair, you may lose it from your head, face, armpits, pubic area, chest, and/or legs. You may also notice your hair getting thin.  Changes in blood pressure  Note: Not all possible side effects are included above.  Warnings and Precautions  Skin and tissue irritation including redness, pain, warmth, or swelling at the IV site if the drug leaks out of the vein and into nearby tissue. Very rarely, it may cause tissue necrosis (death).  Tumor lysis syndrome: This drug may act  on the cancer cells very quickly. This may affect how your kidneys work.  Allergic reactions, including anaphylaxis are rare but may happen in some patients. Signs of allergic reaction to this drug may be swelling of the face, feeling like your tongue or throat are swelling, trouble breathing, rash, itching, fever, chills, feeling dizzy, and/or feeling that your heart is beating in a fast or not normal way. If  this happens, do not take another dose of this drug. You should get urgent medical treatment.  Changes in your central nervous system can happen. You may have numbness or tingling, also described above as neuropathy. With more doses this may cause problems with balance and walking. Rarely, it may cause paralysis. If you start to have any of these symptoms let your doctor know right away.  Constipation may be severe causing intestinal blockage or perforation.  Trouble breathing or severe spasm of airway may occur following infusion of vincristine usually in combination with another drug called mitomycin.  Note: Some of the side effects above are very rare. If you have concerns and/or questions, please discuss them with your medical team.  Important Information  This drug may be present in the saliva, tears, sweat, urine, stool, vomit, semen, and vaginal secretions. Talk to your doctor and/or your nurse about the necessary precautions to take during this time.  Treating Side Effects  Drink plenty of fluids (a minimum of eight glasses per day is recommended).  If you throw up or have loose bowel movements, you should drink more fluids so that you do not become dehydrated (lack of water in the body from losing too much fluid).  If you have diarrhea, eat low-fiber foods that are high in protein and calories and avoid foods that can irritate your digestive tracts or lead to cramping.  To help with nausea and vomiting, eat small, frequent meals instead of three large meals a day. Choose foods and drinks that are at room temperature. Ask your nurse or doctor about other helpful tips and medicine that is available to help stop or lessen these symptoms.  Ask your doctor or nurse about medicines that are available to help stop or lessen constipation and/ or diarrhea.  If you are not able to move your bowels, check with your doctor or nurse before you use enemas, laxatives, or  suppositories.  To help decrease weight loss, drink fluids that contribute calories (whole milk, juice, soft drinks, sweetened beverages, milkshakes, and nutritional supplements) instead of water.  Include a source of protein at every meal and snack, such as meat, poultry, fish, dry beans, tofu, eggs, nuts, milk, yogurt, cheese, ice cream, pudding, and nutritional supplements.  If you are dizzy, get up slowly after sitting or lying.  If you have numbness and tingling in your hands and feet, be careful when cooking, walking, and handling sharp objects and hot liquids.  Keeping your pain under control is important to your well-being. Please tell your doctor or nurse if you are experiencing pain.  To help with hair loss, wash with a mild shampoo and avoid washing your hair every day.  Avoid rubbing your scalp, pat your hair or scalp dry.  Avoid coloring your hair.  Limit your use of hair spray, electric curlers, blow dryers, and curling irons.  If you are interested in getting a wig, talk to your nurse. You can also call the [facility] at 800-ACS-2345 to find out information about the Look Good, Feel Better program close to where  you live. It is a free program where women getting chemotherapy can learn about wigs, turbans and scarves as well as makeup techniques and skin and nail care.  Food and Drug Interactions  There are no known interactions of vincristine with food.  Check with your doctor or pharmacist about all other prescription medicines and dietary supplements you are taking before starting this medicine as there are known drug interactions with vincristine. Also, check with your doctor or pharmacist before starting any new prescription or overthe- counter medicines, or dietary supplement to make sure that there are no interactions.  When to Call the Doctor Call your doctor or nurse if you have any of these symptoms and/or any new or unusual symptoms:   Fever of 100.4 F (38 C) or higher  Chills  A headache that does not go away  Blurry vision or other changes in eyesight  Lightheadedness, or dizziness  Confusion and/or agitation  Seizures  Chest pain or symptoms of a heart attack. Most heart attacks involve pain in the center of the chest that lasts more than a few minutes. The pain may go away and come back or it can be constant. It can feel like pressure, squeezing, fullness, or pain. Sometimes pain is felt in one or both arms, the back, neck, jaw, or stomach. If any of these symptoms last 2 minutes, call 911.  Trouble breathing  Nausea that stops you from eating or drinking and/or is not relieved by prescribed medicines  Throwing up more than 3 times a day  Pain in your abdomen that does not go away  No bowel movement in 3 days or when you feel uncomfortable  Diarrhea, 4 times in one day or diarrhea with lack of strength or a feeling of being dizzy  Lasting loss of appetite or rapid weight loss of five pounds in a week  While you are getting this drug, please tell your nurse right away if you have any pain, redness, or swelling at the site of the IV infusion  Numbness, tingling, or pain in your hands and feet  Problems with balance or walking  Inability to urinate  Signs of allergic reaction: swelling of the face, feeling like your tongue or throat are swelling, trouble breathing, rash, itching, fever, chills, feeling dizzy, and/or feeling that your heart is beating in a fast or not normal way. If this happens, call 911 for emergency care.  Signs of tumor lysis: Confusion or agitation, decreased urine, nausea/vomiting, diarrhea, muscle cramping, numbness and/or tingling, seizures.  If you think you may be pregnant  Reproduction Warnings  Pregnancy warning: This drug can have harmful effects on the unborn baby. Women of child bearing potential should use effective methods of birth control during your cancer  treatment. Let your doctor know right away if you think you may be pregnant.  Breastfeeding warning: It is not known if this drug passes into breast milk. For this reason, women should talk to their doctor about the risks and benefits of breastfeeding during treatment with this drug because this drug may enter the breast milk and cause harm to a breastfeeding baby.  Fertility warning: Human fertility studies have not been done with this drug. Talk with your doctor or nurse if you plan to have children. Ask for information on sperm or egg banking.  Pegfilgrastim-xxxx (Neulasta, Neulasta Onpro, Fulphila, Udenyca)  About This Drug Pegfilgrastim-xxxx belongs to a class of medicines called granulocyte colony-stimulating factor (G-CSF). G-CSF helps the body make more white  blood cells. White blood cells help fight infection in your body. This drug is given as an injection under the skin (subcutaneously).  Possible Side Effects  Bone pain  Pain in your arms and/or legs  Note: Each of the side effects above was reported in 5% or greater of patients treated with pegfilgrastim-xxxx. Not all possible side effects are included above.  Warnings and Precautions  Enlargement and inflammation (swelling) of your spleen, which can very rarely rupture and be lifethreatening. Signs of enlargement may be left-sided pain in your abdomen and/or shoulder.  Trouble breathing because of fluid build-up around your lungs and/or inflammation (swelling) of the lungs.  Allergic reactions, including anaphylaxis are rare but may happen in some patients. Signs of allergic reaction to this drug may be swelling of the face, feeling like your tongue or throat are swelling, trouble breathing, rash, itching, fever, chills, feeling dizzy, and/or feeling that your heart is beating in a fast or not normal way. If this happens, do not take another dose of this drug. You should get urgent medical treatment.  Sickle  cell crisis in sickle cell patients treated with pegfilgrastim-xxxx  Changes in your kidney function  A rapid increase in your white blood cells may happen  A syndrome where fluid and protein can leak from your blood vessels into your tissues. This can cause a decrease in your blood protein level and blood pressure and fluid can accumulate in your tissues and/or lungs.  The on-body injector uses acrylic adhesive. Caution should be used if you have an allergy to acrylic adhesives.  Missed or partial doses of the medication have been reported from the on-body injector device not working properly. This could increase your risk of developing severely low white blood cells, which puts you at a risk of severe and life-threatening infections. If you have any problems with the onbody injector, contact your doctor and/or nurse right away.  Inflammation of the aorta- symptoms may include fever, abdominal pain, back pain and feeling tired.  Note: Some of the side effects above are very rare. If you have concerns and/or questions, please discuss them with your medical team.  Important Information  If you have the on-body injector (OBI) placed, avoid activities such as traveling, driving, or operating heavy machinery during hours 26-29 after it is placed.  How to Take Your Medication  Talk to your doctor, nurse and/or pharmacist for proper preparation, dosing, administration. It is important that you follow the instructions and precautions closely.  Treating Side Effects  Keeping your pain under control is important to your well-being. Please tell your doctor or nurse if you are experiencing pain.  Food and Drug Interactions  There are no known interactions of pegfilgrastim-xxxx with food.  Tell your doctor and pharmacist about all the prescription and over-the-counter medicines and dietary supplements (vitamins, minerals, herbs and others) that you are taking at this time.  Also, check with your doctor or pharmacist before starting any new prescription or over-the-counter medicines, or dietary supplements to make sure that there are no interactions.  When to Call the Doctor Call your doctor or nurse if you have any of these symptoms and/or any new or unusual symptoms:  Fever of 100.4 F (38 C) or higher  Chills  Cough  Wheezing and/or trouble breathing  Feeling dizzy or lightheaded  Tiredness that interferes with your daily activities  Pain in the left side of the abdomen and/or shoulder pain     SELF CARE ACTIVITIES WHILE ON CHEMOTHERAPY:  Hydration Increase your fluid intake 48 hours prior to treatment and drink at least 8 to 12 cups (64 ounces) of water/decaffeinated beverages per day after treatment. You can still have your cup of coffee or soda but these beverages do not count as part of your 8 to 12 cups that you need to drink daily. No alcohol intake.  Medications Continue taking your normal prescription medication as prescribed.  If you start any new herbal or new supplements please let us know first to make sure it is safe.  Mouth Care Have teeth cleaned professionally before starting treatment. Keep dentures and partial plates clean. Use soft toothbrush and do not use mouthwashes that contain alcohol. Biotene is a good mouthwash that is available at most pharmacies or may be ordered by calling [phone]. Use warm salt water gargles (1 teaspoon salt per 1 quart warm water) before and after meals and at bedtime. Or you may rinse with 2 tablespoons of three-percent hydrogen peroxide mixed in eight ounces of water. If you are still having problems with your mouth or sores in your mouth please call the clinic. If you need dental work, please let the doctor know before you go for your appointment so that we can coordinate the best possible time for you in regards to your chemo regimen. You need to also let your dentist know that you are  actively taking chemo. We may need to do labs prior to your dental appointment.  Skin Care Always use sunscreen that has not expired and with SPF (Sun Protection Factor) of 50 or higher. Wear hats to protect your head from the sun. Remember to use sunscreen on your hands, ears, face, & feet.  Use good moisturizing lotions such as udder cream, eucerin, or even Vaseline. Some chemotherapies can cause dry skin, color changes in your skin and nails.     Avoid long, hot showers or baths.  Use gentle, fragrance-free soaps and laundry detergent.  Use moisturizers, preferably creams or ointments rather than lotions because the thicker consistency is better at preventing skin dehydration. Apply the cream or ointment within 15 minutes of showering. Reapply moisturizer at night, and moisturize your hands every time after you wash them.  Hair Loss (if your doctor says your hair will fall out)   If your doctor says that your hair is likely to fall out, decide before you begin chemo whether you want to wear a wig. You may want to shop before treatment to match your hair color.  Hats, turbans, and scarves can also camouflage hair loss, although some people prefer to leave their heads uncovered. If you go bare-headed outdoors, be sure to use sunscreen on your scalp.  Cut your hair short. It eases the inconvenience of shedding lots of hair, but it also can reduce the emotional impact of watching your hair fall out.  Don't perm or color your hair during chemotherapy. Those chemical treatments are already damaging to hair and can enhance hair loss. Once your chemo treatments are done and your hair has grown back, it's OK to resume dyeing or perming hair. With chemotherapy, hair loss is almost always temporary. But when it grows back, it may be a different color or texture. In older adults who still had hair color before chemotherapy, the new growth may be completely gray.  Often, new hair is very fine and  soft.  Infection Prevention Please wash your hands for at least 30 seconds using warm soapy water. Handwashing is the #1 way  to prevent the spread of germs. Stay away from sick people or people who are getting over a cold. If you develop respiratory systems such as green/yellow mucus production or productive cough or persistent cough let us know and we will see if you need an antibiotic. It is a good idea to keep a pair of gloves on when going into grocery stores/[employer] to decrease your risk of coming into contact with germs on the carts, etc. Carry alcohol hand gel with you at all times and use it frequently if out in public. If your temperature reaches 100.5 or higher please call the clinic and let us know.  If it is after hours or on the weekend please go to the ER if your temperature is over 100.5.  Please have your own personal thermometer at home to use.    Sex and bodily fluids If you are going to have sex, a condom must be used to protect the person that isn't taking chemotherapy. Chemo can decrease your libido (sex drive). For a few days after chemotherapy, chemotherapy can be excreted through your bodily fluids.  When using the toilet please close the lid and flush the toilet twice.  Do this for a few day after you have had chemotherapy.   Effects of chemotherapy on your sex life Some changes are simple and won't last long. They won't affect your sex life permanently. Sometimes you may feel:  too tired  not strong enough to be very active  sick or sore   not in the mood  anxious or low Your anxiety might not seem related to sex. For example, you may be worried about the cancer and how your treatment is going. Or you may be worried about money, or about how you family are coping with your illness. These things can cause stress, which can affect your interest in sex. It's important to talk to your partner about how you feel. Remember - the changes to your sex life don't usually last  long. There's usually no medical reason to stop having sex during chemo. The drugs won't have any long term physical effects on your performance or enjoyment of sex. Cancer can't be passed on to your partner during sex  Contraception It's important to use reliable contraception during treatment. Avoid getting pregnant while you or your partner are having chemotherapy. This is because the drugs may harm the baby. Sometimes chemotherapy drugs can leave a man or woman infertile.  This means you would not be able to have children in the future. You might want to talk to someone about permanent infertility. It can be very difficult to learn that you may no longer be able to have children. Some people find counselling helpful. There might be ways to preserve your fertility, although this is easier for men than for women. You may want to speak to a fertility expert. You can talk about sperm banking or harvesting your eggs. You can also ask about other fertility options, such as donor eggs. If you have or have had breast cancer, your doctor might advise you not to take the contraceptive pill. This is because the hormones in it might affect the cancer.  It is not known for sure whether or not chemotherapy drugs can be passed on through semen or secretions from the vagina. Because of this some doctors advise people to use a barrier method if you have sex during treatment. This applies to vaginal, anal or oral sex. Generally, doctors advise a barrier  method only for the time you are actually having the treatment and for about a week after your treatment. Advice like this can be worrying, but this does not mean that you have to avoid being intimate with your partner. You can still have close contact with your partner and continue to enjoy sex.  Animals If you have cats or birds we just ask that you not change the litter or change the cage.  Please have someone else do this for you while you are on chemotherapy.    Food Safety During and After Cancer Treatment Food safety is important for people both during and after cancer treatment. Cancer and cancer treatments, such as chemotherapy, radiation therapy, and stem cell/bone marrow transplantation, often weaken the immune system. This makes it harder for your body to protect itself from foodborne illness, also called food poisoning. Foodborne illness is caused by eating food that contains harmful bacteria, parasites, or viruses.  Foods to avoid Some foods have a higher risk of becoming tainted with bacteria. These include:  Unwashed fresh fruit and vegetables, especially leafy vegetables that can hide dirt and other contaminants  Raw sprouts, such as alfalfa sprouts  Raw or undercooked beef, especially ground beef, or other raw or undercooked meat and poultry  Fatty, fried, or spicy foods immediately before or after treatment.  These can sit heavy on your stomach and make you feel nauseous.  Raw or undercooked shellfish, such as oysters.  Sushi and sashimi, which often contain raw fish.   Unpasteurized beverages, such as unpasteurized fruit juices, raw milk, raw yogurt, or cider  Undercooked eggs, such as soft boiled, over easy, and poached; raw, unpasteurized eggs; or foods made with raw egg, such as homemade raw cookie dough and homemade mayonnaise Simple steps for food safety Shop smart.  Do not buy food stored or displayed in an unclean area.  Do not buy bruised or damaged fruits or vegetables.  Do not buy cans that have cracks, dents, or bulges.  Pick up foods that can spoil at the end of your shopping trip and store them in a cooler on the way home. Prepare and clean up foods carefully.  Rinse all fresh fruits and vegetables under running water, and dry them with a clean towel or paper towel.  Clean the top of cans before opening them.  After preparing food, wash your hands for 20 seconds with hot water and soap. Pay special  attention to areas between fingers and under nails.  Clean your utensils and dishes with hot water and soap.  Disinfect your kitchen and cutting boards using 1 teaspoon of liquid, unscented bleach mixed into 1 quart of water.   Dispose of old food.  Eat canned and packaged food before its expiration date (the use by or best before date).  Consume refrigerated leftovers within 3 to 4 days. After that time, throw out the food. Even if the food does not smell or look spoiled, it still may be unsafe. Some bacteria, such as Listeria, can grow even on foods stored in the refrigerator if they are kept for too long. Take precautions when eating out.  At restaurants, avoid buffets and salad bars where food sits out for a long time and comes in contact with many people. Food can become contaminated when someone with a virus, often a norovirus, or another bug handles it.  Put any leftover food in a to-go container yourself, rather than having the server do it. And, refrigerate leftovers as soon  as you get home.  Choose restaurants that are clean and that are willing to prepare your food as you order it cooked.   MEDICATIONS:                                                                                                                                                                Compazine/Prochlorperazine 10mg  tablet. Take 1 tablet every 6 hours as needed for nausea/vomiting.   Zofran 8 mg - Take 1 tablet twice daily starting the 3rd day after chemotherapy.    Prednisone 20 mg - Take 5 tablets (100 mg) on days 1-5 of chemotherapy every 21 days.    Over-the-Counter Meds:  Colace - 100 mg capsules - take 2 capsules daily.  If this doesn't help then you can increase to 2 capsules twice daily.  Call us if this does not help your bowels move.   Imodium 2mg  capsule. Take 2 capsules after the 1st loose stool and then 1 capsule every 2 hours until you go a total of 12 hours without having a  loose stool. Call the [facility] if loose stools continue. If diarrhea occurs at bedtime, take 2 capsules at bedtime. Then take 2 capsules every 4 hours until morning. Call [facility].    Diarrhea Sheet   If you are having loose stools/diarrhea, please purchase Imodium and begin taking as outlined:  At the first sign of poorly formed or loose stools you should begin taking Imodium (loperamide) 2 mg capsules.  Take two caplets (4mg ) followed by one caplet (2mg ) every 2 hours until you have had no diarrhea for 12 hours.  During the night take two caplets (4mg ) at bedtime and continue every 4 hours during the night until the morning.  Stop taking Imodium only after there is no sign of diarrhea for 12 hours.    Always call the [facility] if you are having loose stools/diarrhea that you can't get under control.  Loose stools/diarrhea leads to dehydration (loss of water) in your body.  We have other options of trying to get the loose stools/diarrhea to stop but you must let us know!   Constipation Sheet  Colace - 100 mg capsules - take 2 capsules daily.  If this doesn't help then you can increase to 2 capsules twice daily.  Please call if the above does not work for you.   Do not go more than 2 days without a bowel movement.  It is very important that you do not become constipated.  It will make you feel sick to your stomach (nausea) and can cause abdominal pain and vomiting.   Nausea Sheet   Compazine/Prochlorperazine 10mg  tablet. Take 1 tablet every 6 hours as needed for nausea/vomiting. (This can make you sleepy)  Zofran -  8 mg tablet - Take 1 tablet twice daily starting the 3rd day after chemotherapy.   If you are having persistent nausea (nausea that does not stop) please call the [facility] and let us know the amount of nausea that you are experiencing.  If you begin to vomit, you need to call the [facility] and if it is the weekend and you have vomited more than one time and  can't get it to stop-go to the Emergency Room.  Persistent nausea/vomiting can lead to dehydration (loss of fluid in your body) and will make you feel terrible.   Ice chips, sips of clear liquids, foods that are @ room temperature, crackers, and toast tend to be better tolerated.   SYMPTOMS TO REPORT AS SOON AS POSSIBLE AFTER TREATMENT:   FEVER GREATER THAN 100.5 F  CHILLS WITH OR WITHOUT FEVER  NAUSEA AND VOMITING THAT IS NOT CONTROLLED WITH YOUR NAUSEA MEDICATION  UNUSUAL SHORTNESS OF BREATH  UNUSUAL BRUISING OR BLEEDING  TENDERNESS IN MOUTH AND THROAT WITH OR WITHOUT PRESENCE OF ULCERS  URINARY PROBLEMS  BOWEL PROBLEMS  UNUSUAL RASH      Wear comfortable clothing and clothing appropriate for easy access to any Portacath or PICC line. Let us know if there is anything that we can do to make your therapy better!    What to do if you need assistance after hours or on the weekends: CALL [phone].  HOLD on the line, do not hang up.  You will hear multiple messages but at the end you will be connected with a nurse triage line.  They will contact the doctor if necessary.  Most of the time they will be able to assist you.  Do not call the hospital operator.      I have been informed and understand all of the instructions given to me and have received a copy. I have been instructed to call the clinic [phone] or my family physician as soon as possible for continued medical care, if indicated. I do not have any more questions at this time but understand that I may call the [facility] or the Patient Navigator at [phone] during office hours should I have questions or need assistance in obtaining follow-up care.

## 2018-01-27 NOTE — Patient Instructions (Signed)
[facility] Discharge Instructions     Thank you for choosing [facility] at [facility] to provide your oncology and hematology care.  To afford each patient quality time with our provider, please arrive at least 15 minutes before your scheduled appointment time.   If you have a lab appointment with the Cancer Center please come in thru the  Main Entrance and check in at the main information desk  You need to re-schedule your appointment should you arrive 10 or more minutes late.  We strive to give you quality time with our providers, and arriving late affects you and other patients whose appointments are after yours.  Also, if you no show three or more times for appointments you may be dismissed from the clinic at the providers discretion.     Again, thank you for choosing [facility].  Our hope is that these requests will decrease the amount of time that you wait before being seen by our physicians.       _____________________________________________________________  Should you have questions after your visit to  Cancer Center, please contact our office at [phone] between the hours of 8:00 a.m. and 4:30 p.m.  Voicemails left after 4:00 p.m. will not be returned until the following business day.  For prescription refill requests, have your pharmacy contact our office and allow 72 hours.    Cancer Center Support Programs:   > Cancer Support Group  2nd Tuesday of the month 1pm-2pm, Journey Room    

## 2018-01-27 NOTE — Progress Notes (Signed)
Per Dr. Delton Coombes orders placed for urgent PET scan.

## 2018-01-28 ENCOUNTER — Other Ambulatory Visit: Payer: Self-pay

## 2018-01-28 ENCOUNTER — Inpatient Hospital Stay (HOSPITAL_COMMUNITY): Payer: Commercial Managed Care - PPO

## 2018-01-28 ENCOUNTER — Encounter (HOSPITAL_COMMUNITY): Payer: Self-pay

## 2018-01-28 VITALS — BP 114/59 | HR 79 | Temp 97.7°F | Resp 16 | Ht 63.0 in | Wt 135.2 lb

## 2018-01-28 DIAGNOSIS — C8447 Peripheral T-cell lymphoma, not classified, spleen: Secondary | ICD-10-CM

## 2018-01-28 DIAGNOSIS — Z5111 Encounter for antineoplastic chemotherapy: Secondary | ICD-10-CM | POA: Diagnosis not present

## 2018-01-28 LAB — PREPARE RBC (CROSSMATCH)

## 2018-01-28 MED ORDER — SODIUM CHLORIDE 0.9 % IV SOLN
75.0000 mg/m2 | Freq: Once | INTRAVENOUS | Status: AC
  Administered 2018-01-28: 130 mg via INTRAVENOUS
  Filled 2018-01-28: qty 6.5

## 2018-01-28 MED ORDER — CIPROFLOXACIN HCL 500 MG PO TABS: 500.0000 mg | ORAL_TABLET | Freq: Two times a day (BID) | ORAL | 2 refills | Status: DC

## 2018-01-28 MED ORDER — SODIUM CHLORIDE 0.9 % IV SOLN
Freq: Once | INTRAVENOUS | Status: AC
  Administered 2018-01-28: 11:00:00 via INTRAVENOUS
  Filled 2018-01-28: qty 5

## 2018-01-28 MED ORDER — PROCHLORPERAZINE MALEATE 10 MG PO TABS: 10.0000 mg | ORAL_TABLET | Freq: Four times a day (QID) | ORAL | 2 refills | Status: AC | PRN

## 2018-01-28 MED ORDER — FUROSEMIDE 20 MG PO TABS: 20.0000 mg | ORAL_TABLET | Freq: Every day | ORAL | 2 refills | Status: AC | PRN

## 2018-01-28 MED ORDER — PALONOSETRON HCL INJECTION 0.25 MG/5ML
0.2500 mg | Freq: Once | INTRAVENOUS | Status: AC
  Administered 2018-01-28: 0.25 mg via INTRAVENOUS
  Filled 2018-01-28: qty 5

## 2018-01-28 MED ORDER — SODIUM CHLORIDE 0.9 % IV SOLN
750.0000 mg/m2 | Freq: Once | INTRAVENOUS | Status: AC
  Administered 2018-01-28: 1260 mg via INTRAVENOUS
  Filled 2018-01-28: qty 50

## 2018-01-28 MED ORDER — SODIUM CHLORIDE 0.9 % IV SOLN
Freq: Once | INTRAVENOUS | Status: AC
  Administered 2018-01-28: 11:00:00 via INTRAVENOUS

## 2018-01-28 MED ORDER — PREDNISONE 50 MG PO TABS
100.0000 mg | ORAL_TABLET | Freq: Once | ORAL | Status: AC
  Administered 2018-01-28: 100 mg via ORAL
  Filled 2018-01-28: qty 2

## 2018-01-28 MED ORDER — VINCRISTINE SULFATE CHEMO INJECTION 1 MG/ML
1.0000 mg | Freq: Once | INTRAVENOUS | Status: AC
  Administered 2018-01-28: 1 mg via INTRAVENOUS
  Filled 2018-01-28: qty 1

## 2018-01-28 MED ORDER — DOXORUBICIN HCL CHEMO IV INJECTION 2 MG/ML
30.0000 mg/m2 | Freq: Once | INTRAVENOUS | Status: AC
  Administered 2018-01-28: 50 mg via INTRAVENOUS
  Filled 2018-01-28: qty 25

## 2018-01-28 MED ORDER — SODIUM CHLORIDE 0.9% FLUSH
10.0000 mL | INTRAVENOUS | Status: DC | PRN
  Administered 2018-01-28: 10 mL
  Filled 2018-01-28: qty 10

## 2018-01-28 MED ORDER — PREDNISONE 20 MG PO TABS: 100.0000 mg | ORAL_TABLET | Freq: Every day | ORAL | 2 refills | Status: DC

## 2018-01-28 MED ORDER — VALACYCLOVIR HCL 500 MG PO TABS: 500.0000 mg | ORAL_TABLET | Freq: Every day | ORAL | 2 refills | Status: DC

## 2018-01-28 MED ORDER — FLUCONAZOLE 100 MG PO TABS: 100.0000 mg | ORAL_TABLET | Freq: Every day | ORAL | 2 refills | Status: DC

## 2018-01-28 NOTE — Treatment Plan (Signed)
Dose reductions due to  First dose and elevated bilirubin.  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

## 2018-01-28 NOTE — Progress Notes (Signed)
CRITICAL VALUE ALERT  Critical Value:  WBC 0.8; platelets 22  Date & Time Notied:  01/27/2018  Provider Notified: Dr. Delton Coombes  Orders Received/Actions taken: n/a

## 2018-01-28 NOTE — Progress Notes (Signed)
Teaching packet given and consent obtained today by Anastasio Champion RN.     Labs reviewed by Dr. Delton Coombes today. Total bilirubin 1.5, hemoglobin 7.6, platelets 22, abs. Neut. 0.1. MD is aware of the above mentioned. Proceed with treatment today per orders. Treatment doses reduced on day one only per MD. Pharmacist aware. Patient will take day one of prednisone today and then pick up her prednisone for day 2-5 after treatment today. Prophylactic antibiotics will be picked up today as well.   Patient complained of loose stools today, pt stated that "the 2nd one was more watery"  Treatment given per orders. Patient tolerated it well without problems. Vitals stable and discharged home from clinic ambulatory. Follow up as scheduled. Pt will have pet scan tomorrow and then one unit of blood with her VP16 as well.

## 2018-01-28 NOTE — Patient Instructions (Signed)
[facility] Discharge Instructions for Patients Receiving Chemotherapy  Today you received the following chemotherapy agents   To help prevent nausea and vomiting after your treatment, we encourage you to take your nausea medication   If you develop nausea and vomiting that is not controlled by your nausea medication, call the clinic.   BELOW ARE SYMPTOMS THAT SHOULD BE REPORTED IMMEDIATELY:  *FEVER GREATER THAN 100.5 F  *CHILLS WITH OR WITHOUT FEVER  NAUSEA AND VOMITING THAT IS NOT CONTROLLED WITH YOUR NAUSEA MEDICATION  *UNUSUAL SHORTNESS OF BREATH  *UNUSUAL BRUISING OR BLEEDING  TENDERNESS IN MOUTH AND THROAT WITH OR WITHOUT PRESENCE OF ULCERS  *URINARY PROBLEMS  *BOWEL PROBLEMS  UNUSUAL RASH Items with * indicate a potential emergency and should be followed up as soon as possible.  Feel free to call the clinic should you have any questions or concerns. The clinic phone number is [phone].  Please show the CHEMO ALERT CARD at check-in to the Emergency Department and triage nurse.   

## 2018-01-29 ENCOUNTER — Ambulatory Visit (HOSPITAL_COMMUNITY): Payer: Commercial Managed Care - PPO

## 2018-01-29 ENCOUNTER — Inpatient Hospital Stay (HOSPITAL_COMMUNITY): Payer: Commercial Managed Care - PPO

## 2018-01-29 ENCOUNTER — Encounter (HOSPITAL_COMMUNITY): Payer: Self-pay | Admitting: Hematology

## 2018-01-29 ENCOUNTER — Encounter
Admission: RE | Admit: 2018-01-29 | Discharge: 2018-01-29 | Disposition: A | Discharge status: Home / Self Care | Payer: Commercial Managed Care - PPO | Source: Ambulatory Visit | Attending: Hematology | Admitting: Hematology

## 2018-01-29 VITALS — BP 128/68 | HR 65 | Temp 97.5°F | Resp 16 | Wt 134.0 lb

## 2018-01-29 DIAGNOSIS — T451X5A Adverse effect of antineoplastic and immunosuppressive drugs, initial encounter: Secondary | ICD-10-CM

## 2018-01-29 DIAGNOSIS — C8447 Peripheral T-cell lymphoma, not classified, spleen: Secondary | ICD-10-CM

## 2018-01-29 DIAGNOSIS — Z5111 Encounter for antineoplastic chemotherapy: Secondary | ICD-10-CM | POA: Diagnosis not present

## 2018-01-29 DIAGNOSIS — E883 Tumor lysis syndrome: Secondary | ICD-10-CM

## 2018-01-29 LAB — CBC WITH DIFFERENTIAL/PLATELET
Abs Immature Granulocytes: 0 10*3/uL (ref 0.00–0.07)
Basophils Absolute: 0 10*3/uL (ref 0.0–0.1)
Basophils Relative: 0 %
EOS PCT: 0 %
Eosinophils Absolute: 0 10*3/uL (ref 0.0–0.5)
HEMATOCRIT: 23.6 % — AB (ref 36.0–46.0)
Hemoglobin: 7.6 g/dL — ABNORMAL LOW (ref 12.0–15.0)
Immature Granulocytes: 0 %
Lymphocytes Relative: 67 %
Lymphs Abs: 0.2 10*3/uL — ABNORMAL LOW (ref 0.7–4.0)
MCH: 29.1 pg (ref 26.0–34.0)
MCHC: 32.2 g/dL (ref 30.0–36.0)
MCV: 90.4 fL (ref 80.0–100.0)
Monocytes Absolute: 0 10*3/uL — ABNORMAL LOW (ref 0.1–1.0)
Monocytes Relative: 3 %
Neutro Abs: 0.1 10*3/uL — ABNORMAL LOW (ref 1.7–7.7)
Neutrophils Relative %: 30 %
Platelets: 23 10*3/uL — CL (ref 150–400)
RBC: 2.61 MIL/uL — ABNORMAL LOW (ref 3.87–5.11)
RDW: 17.1 % — ABNORMAL HIGH (ref 11.5–15.5)
WBC: 0.3 10*3/uL — CL (ref 4.0–10.5)
nRBC: 0 % (ref 0.0–0.2)

## 2018-01-29 LAB — LACTATE DEHYDROGENASE: LDH: 470 U/L — ABNORMAL HIGH (ref 98–192)

## 2018-01-29 LAB — COMPREHENSIVE METABOLIC PANEL
ALBUMIN: 2.9 g/dL — AB (ref 3.5–5.0)
ALT: 103 U/L — AB (ref 0–44)
AST: 106 U/L — AB (ref 15–41)
Alkaline Phosphatase: 329 U/L — ABNORMAL HIGH (ref 38–126)
Anion gap: 10 (ref 5–15)
BUN: 34 mg/dL — AB (ref 6–20)
CO2: 19 mmol/L — ABNORMAL LOW (ref 22–32)
Calcium: 8 mg/dL — ABNORMAL LOW (ref 8.9–10.3)
Chloride: 106 mmol/L (ref 98–111)
Creatinine, Ser: 0.88 mg/dL (ref 0.44–1.00)
GFR calc Af Amer: >60 mL/min (ref >60)
GFR calc non Af Amer: >60 mL/min (ref >60)
GLUCOSE: 387 mg/dL — AB (ref 70–99)
Potassium: 3.3 mmol/L — ABNORMAL LOW (ref 3.5–5.1)
Sodium: 135 mmol/L (ref 135–145)
Total Bilirubin: 1.6 mg/dL — ABNORMAL HIGH (ref 0.3–1.2)
Total Protein: 5.1 g/dL — ABNORMAL LOW (ref 6.5–8.1)

## 2018-01-29 LAB — URIC ACID: Uric Acid, Serum: 9 mg/dL — ABNORMAL HIGH (ref 2.5–7.1)

## 2018-01-29 LAB — MAGNESIUM: Magnesium: 2.1 mg/dL (ref 1.7–2.4)

## 2018-01-29 LAB — GLUCOSE, CAPILLARY
Glucose-Capillary: 316 mg/dL — ABNORMAL HIGH (ref 70–99)
Glucose-Capillary: 328 mg/dL — ABNORMAL HIGH (ref 70–99)

## 2018-01-29 MED ORDER — SODIUM CHLORIDE 0.9% IV SOLUTION
250.0000 mL | Freq: Once | INTRAVENOUS | Status: AC
  Administered 2018-01-29: 250 mL via INTRAVENOUS

## 2018-01-29 MED ORDER — SODIUM CHLORIDE 0.9 % IV SOLN
6.0000 mg | Freq: Once | INTRAVENOUS | Status: AC
  Administered 2018-01-29: 6 mg via INTRAVENOUS
  Filled 2018-01-29: qty 4

## 2018-01-29 MED ORDER — POTASSIUM CHLORIDE CRYS ER 20 MEQ PO TBCR
EXTENDED_RELEASE_TABLET | ORAL | Status: AC
  Filled 2018-01-29: qty 1

## 2018-01-29 MED ORDER — DIPHENHYDRAMINE HCL 25 MG PO CAPS
25.0000 mg | ORAL_CAPSULE | Freq: Once | ORAL | Status: AC
  Administered 2018-01-29: 25 mg via ORAL
  Filled 2018-01-29: qty 1

## 2018-01-29 MED ORDER — SODIUM CHLORIDE 0.9 % IV SOLN
10.0000 mg | Freq: Once | INTRAVENOUS | Status: AC
  Administered 2018-01-29: 10 mg via INTRAVENOUS
  Filled 2018-01-29: qty 1

## 2018-01-29 MED ORDER — SODIUM CHLORIDE 0.9 % IV SOLN
75.0000 mg/m2 | Freq: Once | INTRAVENOUS | Status: AC
  Administered 2018-01-29: 130 mg via INTRAVENOUS
  Filled 2018-01-29: qty 6.5

## 2018-01-29 MED ORDER — INSULIN LISPRO 100 UNIT/ML ~~LOC~~ SOLN: SUBCUTANEOUS | 11 refills | Status: DC

## 2018-01-29 MED ORDER — POTASSIUM CHLORIDE CRYS ER 20 MEQ PO TBCR
40.0000 meq | EXTENDED_RELEASE_TABLET | Freq: Once | ORAL | Status: AC
  Administered 2018-01-29: 40 meq via ORAL

## 2018-01-29 MED ORDER — SODIUM CHLORIDE 0.9 % IV SOLN
INTRAVENOUS | Status: DC
  Administered 2018-01-29: 11:00:00 via INTRAVENOUS

## 2018-01-29 MED ORDER — ACETAMINOPHEN 325 MG PO TABS
650.0000 mg | ORAL_TABLET | Freq: Once | ORAL | Status: AC
  Administered 2018-01-29: 650 mg via ORAL
  Filled 2018-01-29: qty 2

## 2018-01-29 NOTE — Progress Notes (Signed)
Labs drawn today per MD. Will proceed with treatment and blood transfusion per MD. Patient had no complaints since yesterday other than the usual night sweats that she has been having, no fever reported. MD notified of this.   Labs reviewed by MD and will give potassium and rasburicase as ordered.   One unit of blood given today. Treatment given per orders. Patient tolerated it well without problems. Vitals stable and discharged home from clinic ambulatory. Follow up as scheduled.

## 2018-01-29 NOTE — Progress Notes (Signed)
CRITICAL VALUE ALERT  Critical Value:  WBC 0.3; platelets 23  Date & Time Notied:  01/29/2018 at 1045  Provider Notified: Dr. Delton Coombes  Orders Received/Actions taken: na/

## 2018-01-30 ENCOUNTER — Encounter (HOSPITAL_COMMUNITY): Payer: Self-pay

## 2018-01-30 ENCOUNTER — Inpatient Hospital Stay (HOSPITAL_COMMUNITY): Payer: Commercial Managed Care - PPO

## 2018-01-30 VITALS — BP 147/73 | HR 65 | Temp 97.4°F | Resp 18 | Wt 132.3 lb

## 2018-01-30 DIAGNOSIS — Z5111 Encounter for antineoplastic chemotherapy: Secondary | ICD-10-CM | POA: Diagnosis not present

## 2018-01-30 DIAGNOSIS — C8447 Peripheral T-cell lymphoma, not classified, spleen: Secondary | ICD-10-CM

## 2018-01-30 LAB — CBC WITH DIFFERENTIAL/PLATELET
Abs Immature Granulocytes: 0 10*3/uL (ref 0.00–0.07)
BASOS ABS: 0 10*3/uL (ref 0.0–0.1)
Basophils Relative: 0 %
Eosinophils Absolute: 0 10*3/uL (ref 0.0–0.5)
Eosinophils Relative: 0 %
HCT: 28 % — ABNORMAL LOW (ref 36.0–46.0)
Hemoglobin: 9.1 g/dL — ABNORMAL LOW (ref 12.0–15.0)
Immature Granulocytes: 0 %
LYMPHS PCT: 52 %
Lymphs Abs: 0.3 10*3/uL — ABNORMAL LOW (ref 0.7–4.0)
MCH: 29.3 pg (ref 26.0–34.0)
MCHC: 32.5 g/dL (ref 30.0–36.0)
MCV: 90 fL (ref 80.0–100.0)
Monocytes Absolute: 0 10*3/uL — ABNORMAL LOW (ref 0.1–1.0)
Monocytes Relative: 2 %
Neutro Abs: 0.2 10*3/uL — ABNORMAL LOW (ref 1.7–7.7)
Neutrophils Relative %: 46 %
Platelets: 34 10*3/uL — ABNORMAL LOW (ref 150–400)
RBC: 3.11 MIL/uL — AB (ref 3.87–5.11)
RDW: 16.8 % — AB (ref 11.5–15.5)
WBC: 0.5 10*3/uL — CL (ref 4.0–10.5)
nRBC: 0 % (ref 0.0–0.2)

## 2018-01-30 LAB — TYPE AND SCREEN
ABO/RH(D): B POS
Antibody Screen: NEGATIVE
Unit division: 0

## 2018-01-30 LAB — COMPREHENSIVE METABOLIC PANEL
ALT: 78 U/L — AB (ref 0–44)
AST: 44 U/L — AB (ref 15–41)
Albumin: 3.3 g/dL — ABNORMAL LOW (ref 3.5–5.0)
Alkaline Phosphatase: 298 U/L — ABNORMAL HIGH (ref 38–126)
Anion gap: 10 (ref 5–15)
BUN: 48 mg/dL — ABNORMAL HIGH (ref 6–20)
CHLORIDE: 108 mmol/L (ref 98–111)
CO2: 19 mmol/L — ABNORMAL LOW (ref 22–32)
CREATININE: 0.88 mg/dL (ref 0.44–1.00)
Calcium: 8.4 mg/dL — ABNORMAL LOW (ref 8.9–10.3)
GFR calc Af Amer: >60 mL/min (ref >60)
GFR calc non Af Amer: >60 mL/min (ref >60)
Glucose, Bld: 226 mg/dL — ABNORMAL HIGH (ref 70–99)
POTASSIUM: 4 mmol/L (ref 3.5–5.1)
Sodium: 137 mmol/L (ref 135–145)
Total Bilirubin: 1.3 mg/dL — ABNORMAL HIGH (ref 0.3–1.2)
Total Protein: 5.5 g/dL — ABNORMAL LOW (ref 6.5–8.1)

## 2018-01-30 LAB — BPAM RBC
Blood Product Expiration Date: 202002012359
ISSUE DATE / TIME: 202001161354
Unit Type and Rh: 5100

## 2018-01-30 LAB — URIC ACID: Uric Acid, Serum: 3.4 mg/dL (ref 2.5–7.1)

## 2018-01-30 LAB — PHOSPHORUS: Phosphorus: 4.9 mg/dL — ABNORMAL HIGH (ref 2.5–4.6)

## 2018-01-30 LAB — MAGNESIUM: Magnesium: 2.3 mg/dL (ref 1.7–2.4)

## 2018-01-30 MED ORDER — HEPARIN SOD (PORK) LOCK FLUSH 100 UNIT/ML IV SOLN
250.0000 [IU] | Freq: Once | INTRAVENOUS | Status: AC
  Administered 2018-01-30: 250 [IU] via INTRAVENOUS

## 2018-01-30 MED ORDER — SODIUM CHLORIDE 0.9% FLUSH
10.0000 mL | INTRAVENOUS | Status: DC | PRN
  Administered 2018-01-30: 10 mL via INTRAVENOUS
  Filled 2018-01-30: qty 10

## 2018-01-30 MED ORDER — SODIUM CHLORIDE 0.9 % IV SOLN
75.0000 mg/m2 | Freq: Once | INTRAVENOUS | Status: AC
  Administered 2018-01-30: 130 mg via INTRAVENOUS
  Filled 2018-01-30: qty 6.5

## 2018-01-30 MED ORDER — SODIUM CHLORIDE 0.9 % IV SOLN
INTRAVENOUS | Status: DC
  Administered 2018-01-30: 13:00:00 via INTRAVENOUS

## 2018-01-30 MED ORDER — SODIUM CHLORIDE 0.9 % IV SOLN
10.0000 mg | Freq: Once | INTRAVENOUS | Status: AC
  Administered 2018-01-30: 10 mg via INTRAVENOUS
  Filled 2018-01-30: qty 1

## 2018-01-30 NOTE — Patient Instructions (Signed)
[facility] Discharge Instructions for Patients Receiving Chemotherapy  Today you received the following chemotherapy agents   To help prevent nausea and vomiting after your treatment, we encourage you to take your nausea medication   If you develop nausea and vomiting that is not controlled by your nausea medication, call the clinic.   BELOW ARE SYMPTOMS THAT SHOULD BE REPORTED IMMEDIATELY:  *FEVER GREATER THAN 100.5 F  *CHILLS WITH OR WITHOUT FEVER  NAUSEA AND VOMITING THAT IS NOT CONTROLLED WITH YOUR NAUSEA MEDICATION  *UNUSUAL SHORTNESS OF BREATH  *UNUSUAL BRUISING OR BLEEDING  TENDERNESS IN MOUTH AND THROAT WITH OR WITHOUT PRESENCE OF ULCERS  *URINARY PROBLEMS  *BOWEL PROBLEMS  UNUSUAL RASH Items with * indicate a potential emergency and should be followed up as soon as possible.  Feel free to call the clinic should you have any questions or concerns. The clinic phone number is [phone].  Please show the CHEMO ALERT CARD at check-in to the Emergency Department and triage nurse.   

## 2018-01-30 NOTE — Progress Notes (Signed)
Rozelle M Morrissey tolerated VP-16 infusion well without complaints or incident. VSS upon discharge. Pt discharged self ambulatory in satisfactory condition accompanied by family member

## 2018-01-30 NOTE — Patient Instructions (Signed)
[facility] Discharge Instructions for Patients Receiving Chemotherapy   Beginning January 23rd 2017 lab work for the Cancer Center will be done in the  Main lab at [facility] on 1st floor. If you have a lab appointment with the Cancer Center please come in thru the  Main Entrance and check in at the main information desk   Today you received the following chemotherapy agents VP-16. Follow-up as scheduled. Call clinic for any questions or concerns  To help prevent nausea and vomiting after your treatment, we encourage you to take your nausea medication   If you develop nausea and vomiting, or diarrhea that is not controlled by your medication, call the clinic.  The clinic phone number is [phone]. Office hours are Monday-Friday 8:30am-5:00pm.  BELOW ARE SYMPTOMS THAT SHOULD BE REPORTED IMMEDIATELY:  *FEVER GREATER THAN 101.0 F  *CHILLS WITH OR WITHOUT FEVER  NAUSEA AND VOMITING THAT IS NOT CONTROLLED WITH YOUR NAUSEA MEDICATION  *UNUSUAL SHORTNESS OF BREATH  *UNUSUAL BRUISING OR BLEEDING  TENDERNESS IN MOUTH AND THROAT WITH OR WITHOUT PRESENCE OF ULCERS  *URINARY PROBLEMS  *BOWEL PROBLEMS  UNUSUAL RASH Items with * indicate a potential emergency and should be followed up as soon as possible. If you have an emergency after office hours please contact your primary care physician or go to the nearest emergency department.  Please call the clinic during office hours if you have any questions or concerns.   You may also contact the Patient Navigator at [phone] should you have any questions or need assistance in obtaining follow up care.      Resources For Cancer Patients and their Caregivers ? American Cancer Society: Can assist with transportation, wigs, general needs, runs Look Good Feel Better.        1-888-227-6333 ? Cancer Care: Provides financial assistance, online support groups, medication/co-pay assistance.  1-800-813-HOPE  (4673) ? [facility] Assists [place] cancer patients and their families through emotional , educational and financial support.  336-427-4357 ? [place] DSS Where to apply for food stamps, Medicaid and utility assistance. 336-342-1394 ? [facility]: Transportation to medical appointments. 336-347-2287 ? Social Security Administration: May apply for disability if have a Stage IV cancer. 336-342-7796 1-800-772-1213 ? [place] Aging, Disability and Transit Services: Assists with nutrition, care and transit needs. 336-349-2343         

## 2018-01-30 NOTE — Progress Notes (Signed)
Labs drawn per orders.  Lab results reviewed with MD today. Will get VP 16 today, day 3. Pet scan done on Monday at [facility] and back her to [facility] for picc flush, labs , and [place]

## 2018-02-02 ENCOUNTER — Other Ambulatory Visit: Payer: Self-pay

## 2018-02-02 ENCOUNTER — Ambulatory Visit (HOSPITAL_COMMUNITY): Payer: Commercial Managed Care - PPO

## 2018-02-02 ENCOUNTER — Emergency Department (HOSPITAL_COMMUNITY)
Admission: EM | Admit: 2018-02-02 | Discharge: 2018-02-02 | Disposition: A | Discharge status: Home / Self Care | Payer: Commercial Managed Care - PPO | Attending: Emergency Medicine | Admitting: Emergency Medicine

## 2018-02-02 ENCOUNTER — Encounter
Admission: RE | Admit: 2018-02-02 | Discharge: 2018-02-02 | Disposition: A | Discharge status: Home / Self Care | Payer: Commercial Managed Care - PPO | Source: Ambulatory Visit | Attending: Hematology | Admitting: Hematology

## 2018-02-02 ENCOUNTER — Inpatient Hospital Stay (HOSPITAL_COMMUNITY): Payer: Commercial Managed Care - PPO

## 2018-02-02 ENCOUNTER — Encounter (HOSPITAL_COMMUNITY): Payer: Self-pay

## 2018-02-02 DIAGNOSIS — C8447 Peripheral T-cell lymphoma, not classified, spleen: Secondary | ICD-10-CM | POA: Diagnosis present

## 2018-02-02 DIAGNOSIS — E039 Hypothyroidism, unspecified: Secondary | ICD-10-CM | POA: Diagnosis not present

## 2018-02-02 DIAGNOSIS — I10 Essential (primary) hypertension: Secondary | ICD-10-CM | POA: Insufficient documentation

## 2018-02-02 DIAGNOSIS — Z794 Long term (current) use of insulin: Secondary | ICD-10-CM | POA: Insufficient documentation

## 2018-02-02 DIAGNOSIS — R7989 Other specified abnormal findings of blood chemistry: Secondary | ICD-10-CM | POA: Insufficient documentation

## 2018-02-02 DIAGNOSIS — Z79899 Other long term (current) drug therapy: Secondary | ICD-10-CM | POA: Diagnosis not present

## 2018-02-02 DIAGNOSIS — R531 Weakness: Secondary | ICD-10-CM | POA: Diagnosis present

## 2018-02-02 DIAGNOSIS — E790 Hyperuricemia without signs of inflammatory arthritis and tophaceous disease: Secondary | ICD-10-CM

## 2018-02-02 LAB — COMPREHENSIVE METABOLIC PANEL
ALT: 58 U/L — ABNORMAL HIGH (ref 0–44)
ANION GAP: 11 (ref 5–15)
AST: 37 U/L (ref 15–41)
Albumin: 3.4 g/dL — ABNORMAL LOW (ref 3.5–5.0)
Alkaline Phosphatase: 194 U/L — ABNORMAL HIGH (ref 38–126)
BUN: 50 mg/dL — ABNORMAL HIGH (ref 6–20)
CO2: 21 mmol/L — ABNORMAL LOW (ref 22–32)
Calcium: 8.7 mg/dL — ABNORMAL LOW (ref 8.9–10.3)
Chloride: 104 mmol/L (ref 98–111)
Creatinine, Ser: 1.09 mg/dL — ABNORMAL HIGH (ref 0.44–1.00)
GFR calc Af Amer: >60 mL/min (ref >60)
GFR calc non Af Amer: 56 mL/min — ABNORMAL LOW (ref >60)
Glucose, Bld: 256 mg/dL — ABNORMAL HIGH (ref 70–99)
Potassium: 3.7 mmol/L (ref 3.5–5.1)
Sodium: 136 mmol/L (ref 135–145)
TOTAL PROTEIN: 5.3 g/dL — AB (ref 6.5–8.1)
Total Bilirubin: 0.8 mg/dL (ref 0.3–1.2)

## 2018-02-02 LAB — CBC WITH DIFFERENTIAL/PLATELET
Abs Immature Granulocytes: 0.01 10*3/uL (ref 0.00–0.07)
Basophils Absolute: 0 10*3/uL (ref 0.0–0.1)
Basophils Relative: 0 %
Eosinophils Absolute: 0 10*3/uL (ref 0.0–0.5)
Eosinophils Relative: 0 %
HCT: 32 % — ABNORMAL LOW (ref 36.0–46.0)
Hemoglobin: 10.4 g/dL — ABNORMAL LOW (ref 12.0–15.0)
Immature Granulocytes: 1 %
Lymphocytes Relative: 86 %
Lymphs Abs: 1 10*3/uL (ref 0.7–4.0)
MCH: 29.5 pg (ref 26.0–34.0)
MCHC: 32.5 g/dL (ref 30.0–36.0)
MCV: 90.7 fL (ref 80.0–100.0)
Monocytes Absolute: 0 10*3/uL — ABNORMAL LOW (ref 0.1–1.0)
Monocytes Relative: 0 %
Neutro Abs: 0.2 10*3/uL — ABNORMAL LOW (ref 1.7–7.7)
Neutrophils Relative %: 13 %
Platelets: 39 10*3/uL — ABNORMAL LOW (ref 150–400)
RBC: 3.53 MIL/uL — ABNORMAL LOW (ref 3.87–5.11)
RDW: 16 % — ABNORMAL HIGH (ref 11.5–15.5)
WBC: 1.2 10*3/uL — CL (ref 4.0–10.5)
nRBC: 0 % (ref 0.0–0.2)

## 2018-02-02 LAB — TYPE AND SCREEN
ABO/RH(D): B POS
Antibody Screen: NEGATIVE

## 2018-02-02 LAB — URIC ACID: Uric Acid, Serum: 8.4 mg/dL — ABNORMAL HIGH (ref 2.5–7.1)

## 2018-02-02 LAB — GLUCOSE, CAPILLARY: Glucose-Capillary: 94 mg/dL (ref 70–99)

## 2018-02-02 LAB — PHOSPHORUS: Phosphorus: 4.7 mg/dL — ABNORMAL HIGH (ref 2.5–4.6)

## 2018-02-02 LAB — MAGNESIUM: Magnesium: 2.3 mg/dL (ref 1.7–2.4)

## 2018-02-02 MED ORDER — SODIUM CHLORIDE 0.9% FLUSH
10.0000 mL | Freq: Once | INTRAVENOUS | Status: AC
  Administered 2018-02-02: 10 mL via INTRAVENOUS

## 2018-02-02 MED ORDER — PEGFILGRASTIM-CBQV 6 MG/0.6ML ~~LOC~~ SOSY
6.0000 mg | PREFILLED_SYRINGE | Freq: Once | SUBCUTANEOUS | Status: AC
  Administered 2018-02-02: 6 mg via SUBCUTANEOUS

## 2018-02-02 MED ORDER — SODIUM CHLORIDE 0.9 % IV BOLUS
1000.0000 mL | Freq: Once | INTRAVENOUS | Status: AC
  Administered 2018-02-02: 1000 mL via INTRAVENOUS

## 2018-02-02 MED ORDER — SODIUM CHLORIDE 0.9 % IV SOLN
6.0000 mg | Freq: Once | INTRAVENOUS | Status: AC
  Administered 2018-02-02: 6 mg via INTRAVENOUS
  Filled 2018-02-02: qty 4

## 2018-02-02 MED ORDER — PEGFILGRASTIM-CBQV 6 MG/0.6ML ~~LOC~~ SOSY
PREFILLED_SYRINGE | SUBCUTANEOUS | Status: AC
  Filled 2018-02-02: qty 0.6

## 2018-02-02 MED ORDER — HEPARIN SOD (PORK) LOCK FLUSH 100 UNIT/ML IV SOLN
250.0000 [IU] | Freq: Once | INTRAVENOUS | Status: AC
  Administered 2018-02-02: 250 [IU] via INTRAVENOUS

## 2018-02-02 MED ORDER — FLUDEOXYGLUCOSE F - 18 (FDG) INJECTION
6.0000 | Freq: Once | INTRAVENOUS | Status: AC | PRN
  Administered 2018-02-02: 6.62 via INTRAVENOUS

## 2018-02-02 MED ORDER — HEPARIN SOD (PORK) LOCK FLUSH 100 UNIT/ML IV SOLN
INTRAVENOUS | Status: AC
  Administered 2018-02-02: 250 [IU]
  Filled 2018-02-02: qty 5

## 2018-02-02 NOTE — ED Notes (Signed)
Pt updated at this time about medication.

## 2018-02-02 NOTE — ED Notes (Signed)
MD notified [facility] to be delivered from [facility].

## 2018-02-02 NOTE — Discharge Instructions (Addendum)
Follow up with your oncologist as planned

## 2018-02-02 NOTE — ED Triage Notes (Addendum)
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 Pt has picc line in right arm

## 2018-02-02 NOTE — ED Provider Notes (Signed)
[facility] EMERGENCY DEPARTMENT Provider Note   CSN: [id number] Arrival date & time: 02/02/18  1635     History   Chief Complaint Chief Complaint  Patient presents with  . Abnormal Lab    HPI Kristin Good is a 58 y.o. female.  Patient has cancer.  She had blood work that shows a uric acid is elevated.  Her oncologist stated that she had tumor lysis and wanted her to come to the hospital to get a liter of saline and some Elitek.  The history is provided by the patient. No language interpreter was used.  Weakness  Severity:  Mild Onset quality:  Gradual Timing:  Constant Progression:  Unchanged Chronicity:  Recurrent Context: not alcohol use   Relieved by:  Nothing Worsened by:  Nothing Ineffective treatments:  None tried Associated symptoms: no abdominal pain, no chest pain, no cough, no diarrhea, no frequency, no headaches and no seizures     Past Medical History:  Diagnosis Date  . Diabetes ([place]) 01/20/2018  . HTN (hypertension)   . Hypothyroidism   . 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 Protein-calorie malnutrition, severe 01/20/2018  . Transaminasemia   . Sepsis due to undetermined organism ([place]) 01/18/2018  . Sepsis ([place]) 01/18/2018  . Pancytopenia ([place]) 01/18/2018  . Essential hypertension 01/18/2018  . AKI (acute kidney injury) ([place])   . Dehydration   . Transaminitis   . IBS (irritable bowel syndrome) 07/03/2010  . Colon cancer screening 07/03/2010    Past Surgical History:  Procedure Laterality Date  . COLONOSCOPY  07/27/2010   Procedure: COLONOSCOPY;  Surgeon: Dorothyann Peng, MD;  Location: [facility];  Service: Endoscopy;  Laterality: N/A;  . infertility surgery    . MOHS SURGERY     basal cell carcinoma  . REDUCTION MAMMAPLASTY Bilateral 2002     OB History   No obstetric history on file.      Home  Medications    Prior to Admission medications   Medication Sig Start Date End Date Taking? Authorizing Provider  atorvastatin (LIPITOR) 20 MG tablet Take 1 tablet by mouth every morning.  04/17/17  Yes [provider]  calcium-vitamin D (CALCIUM 500+D HIGH POTENCY) 500-400 MG-UNIT tablet Take 1 tablet by mouth 2 (two) times daily.   Yes [provider]  ciprofloxacin (CIPRO) 500 MG tablet Take 1 tablet (500 mg total) by mouth 2 (two) times daily. 01/28/18  Yes Derek Jack, MD  cyclophosphamide in sodium chloride 0.9 % 250 mL Inject 1,260 mg into the vein every 21 ( twenty-one) days.   Yes [provider]  DOXOrubicin HCl (ADRIAMYCIN IV) Inject 84 mg into the vein every 21 ( twenty-one) days.   Yes [provider]  ETOPOSIDE IV Inject 170 mg into the vein every 21 ( twenty-one) days. Days 1-3   Yes [provider]  fluconazole (DIFLUCAN) 100 MG tablet Take 1 tablet (100 mg total) by mouth daily. 01/28/18  Yes Derek Jack, MD  furosemide (LASIX) 20 MG tablet Take 1 tablet (20 mg total) by mouth daily as needed. Patient taking differently: Take 20 mg by mouth daily as needed for fluid.  01/28/18  Yes Derek Jack, MD  Glucosamine-Chondroit-Vit C-Mn (GLUCOSAMINE 1500 COMPLEX PO) Take 1 capsule by mouth 2 (two) times daily.   Yes [provider]  levothyroxine (SYNTHROID,  LEVOTHROID) 75 MCG tablet Take 75 mcg by mouth daily before breakfast.  10/17/17  Yes [provider]  metFORMIN (GLUCOPHAGE) 1000 MG tablet Take 1 tablet by mouth 2 (two) times daily. 10/17/17  Yes [provider]  pegfilgrastim (NEULASTA) 6 MG/0.6ML injection Inject 6 mg into the skin See admin instructions. To be administered 3 days after chemo therapy treatment   Yes [provider]  predniSONE (DELTASONE) 20 MG tablet Take 5 tablets (100 mg total) by mouth daily. Take on days 1-5 of chemotherapy. 01/28/18  Yes Derek Jack, MD   prochlorperazine (COMPAZINE) 10 MG tablet Take 1 tablet (10 mg total) by mouth every 6 (six) hours as needed for nausea or vomiting. 01/28/18  Yes Derek Jack, MD  triamterene-hydrochlorothiazide (MAXZIDE-25) 37.5-25 MG per tablet Take 1 tablet by mouth daily.     Yes [provider]  valACYclovir (VALTREX) 500 MG tablet Take 1 tablet (500 mg total) by mouth daily. 01/28/18  Yes Derek Jack, MD  vinCRIStine 2 mg in sodium chloride 0.9 % 50 mL Inject 2 mg into the vein every 21 ( twenty-one) days.   Yes [provider]  vitamin B-12 (CYANOCOBALAMIN) 1000 MCG tablet Take 1 tablet (1,000 mcg total) by mouth daily. 01/24/18  Yes Gherghe, Vella Redhead, MD  vitamin C (ASCORBIC ACID) 500 MG tablet Take 500 mg by mouth 2 (two) times daily.   Yes [provider]  insulin lispro (HUMALOG) 100 UNIT/ML injection Check blood sugars three times a day and administer Humalog insulin subcutaneously as ordered:  151-200 inject 2 units; 201-250 inject 4 units; 251-300 inject 6 units; greater than or equal to 301 inject 8 units and call MD Patient not taking: Reported on 02/02/2018 01/29/18   Glennie Isle, NP-C    Family History Family History  Problem Relation Age of Onset  . Heart disease Mother   . Diabetes Father   . Breast cancer Sister 70  . Breast cancer Maternal Aunt   . Colon cancer Neg Hx   . Liver disease Neg Hx     Social History Social History   Tobacco Use  . Smoking status: Never Smoker  . Smokeless tobacco: Never Used  Substance Use Topics  . Alcohol use: Yes    Comment: once or twice a year  . Drug use: No     Allergies   Doxycycline; Penicillins; Levofloxacin; and Sulfa antibiotics   Review of Systems Review of Systems  Constitutional: Negative for appetite change and fatigue.  HENT: Negative for congestion, ear discharge and sinus pressure.   Eyes: Negative for discharge.  Respiratory: Negative for cough.   Cardiovascular: Negative  for chest pain.  Gastrointestinal: Negative for abdominal pain and diarrhea.  Genitourinary: Negative for frequency and hematuria.  Musculoskeletal: Negative for back pain.  Skin: Negative for rash.  Neurological: Positive for weakness. Negative for seizures and headaches.  Psychiatric/Behavioral: Negative for hallucinations.     Physical Exam Updated Vital Signs BP 115/65   Pulse 74   Temp (!) 97.4 F (36.3 C) (Oral)   Resp 16   Ht 5\' 3"  (1.6 m)   Wt 52.2 kg   SpO2 100%   BMI 20.37 kg/m   Physical Exam Vitals signs and nursing note reviewed.  Constitutional:      Appearance: She is well-developed.  HENT:     Head: Normocephalic.     Nose: Nose normal.  Eyes:     Conjunctiva/sclera: Conjunctivae normal.  Neck:     Trachea:  No tracheal deviation.  Cardiovascular:     Rate and Rhythm: Normal rate.  Pulmonary:     Effort: Pulmonary effort is normal.  Abdominal:     General: There is no distension.  Musculoskeletal: Normal range of motion.  Skin:    General: Skin is warm.  Neurological:     Mental Status: She is oriented to person, place, and time.      ED Treatments / Results  Labs (all labs ordered are listed, but only abnormal results are displayed) Labs Reviewed - No data to display  EKG None  Radiology Nm Pet Image Initial (pi) Skull Base To Thigh  Result Date: 02/02/2018 CLINICAL DATA:  Initial treatment strategy for T-cell lymphoma of the spleen. EXAM: NUCLEAR MEDICINE PET SKULL BASE TO THIGH TECHNIQUE: 6.62 mCi F-18 FDG was injected intravenously. Full-ring PET imaging was performed from the skull base to thigh after the radiotracer. CT data was obtained and used for attenuation correction and anatomic localization. Fasting blood glucose: 94 mg/dl COMPARISON:  CT scan 01/20/2018 FINDINGS: Mediastinal blood pool activity: SUV max 1.85 NECK: No hypermetabolic lymph nodes in the neck. Incidental CT findings: Multinodular thyroid goiter noted. CHEST: No  hypermetabolic mediastinal or hilar nodes. No suspicious pulmonary nodules on the CT scan. Incidental CT findings: Three-vessel coronary artery calcifications are noted. Right-sided PICC line in good position without complicating features. Aortic calcifications but no aneurysm. ABDOMEN/PELVIS: No abnormal hypermetabolic activity within the liver, pancreas, adrenal glands, or spleen. No hypermetabolic lymph nodes in the abdomen or pelvis. Splenomegaly but no focal splenic lesions or areas of hypermetabolism. The spleen is self is not hypermetabolic. Incidental CT findings: Mild gallbladder distension. Moderate atherosclerotic calcifications involving the aorta. Enlarged fibroid uterus. SKELETON: No focal hypermetabolic activity to suggest skeletal metastasis. Incidental CT findings: none IMPRESSION: 1. Splenomegaly but the spleen is not hypermetabolic and there are no focal hypermetabolic lesions. 2. No enlarged or hypermetabolic lymphadenopathy in the neck, chest, abdomen or pelvis. Electronically Signed   By: Marijo Sanes M.D.   On: 02/02/2018 14:00    Procedures Procedures (including critical care time)  Medications Ordered in ED Medications  rasburicase (ELITEK) 6 mg in sodium chloride 0.9 % 46 mL IVPB ( Intravenous Rate/Dose Verify 02/02/18 2216)  sodium chloride 0.9 % bolus 1,000 mL (0 mLs Intravenous Stopped 02/02/18 1936)     Initial Impression / Assessment and Plan / ED Course  I have reviewed the triage vital signs and the nursing notes.  Pertinent labs & imaging results that were available during my care of the patient were reviewed by me and considered in my medical decision making (see chart for details).    Patient with elevated uric acid.  She was given some Elitek and saline and will follow-up with her oncologist  Final Clinical Impressions(s) / ED Diagnoses   Final diagnoses:  Elevated blood uric acid level    ED Discharge Orders    None       Milton Ferguson,  MD 02/02/18 2226

## 2018-02-03 ENCOUNTER — Telehealth: Payer: Self-pay | Admitting: General Practice

## 2018-02-03 ENCOUNTER — Ambulatory Visit (HOSPITAL_COMMUNITY): Payer: Commercial Managed Care - PPO

## 2018-02-03 NOTE — Progress Notes (Signed)
Late entry for 02-03-2017, 1500. Labs drawn per orders. Patient is ok to go home per MD, does not have to wait on bloodwork. Udenyca given per orders.   Kristin Good presented for PICC line flush. PICC line located right arm . Good blood return present. PICC line flushed with 22ml NS and 300U/12ml Heparin. Procedure without incident. Patient tolerated procedure well.   Vitals stable and discharged home from clinic ambulatory. Follow up as scheduled.

## 2018-02-03 NOTE — Progress Notes (Unsigned)
CRITICAL VALUE ALERT  Critical Value:  WBC 1.2  Date & Time Notied:  02/02/2018 at 1530  Provider Notified: Dr. Delton Coombes  Orders Received/Actions taken: n/a

## 2018-02-03 NOTE — Telephone Encounter (Signed)
[facility] CSW Progress Notes  Call to patient to assess for needs/resources after initial treatment.  "I have a great support system from both sides of the family, every one is jumping in to help."  No needs at this time, family is helping her as needed.  Reviewed opportunities available through support services including cooking class and support group.  Encouraged participation as needed.  Edwyna Shell, LCSW Clinical Social Worker Phone:  [phone]

## 2018-02-05 ENCOUNTER — Ambulatory Visit (HOSPITAL_COMMUNITY): Payer: Commercial Managed Care - PPO

## 2018-02-05 ENCOUNTER — Other Ambulatory Visit: Payer: Self-pay

## 2018-02-05 ENCOUNTER — Inpatient Hospital Stay (HOSPITAL_COMMUNITY): Payer: Commercial Managed Care - PPO

## 2018-02-05 ENCOUNTER — Other Ambulatory Visit (HOSPITAL_COMMUNITY): Payer: Self-pay | Admitting: Nurse Practitioner

## 2018-02-05 ENCOUNTER — Encounter (HOSPITAL_COMMUNITY): Payer: Self-pay

## 2018-02-05 DIAGNOSIS — C8447 Peripheral T-cell lymphoma, not classified, spleen: Secondary | ICD-10-CM

## 2018-02-05 LAB — CBC WITH DIFFERENTIAL/PLATELET
Abs Immature Granulocytes: 0 10*3/uL (ref 0.00–0.07)
Basophils Absolute: 0 10*3/uL (ref 0.0–0.1)
Basophils Relative: 0 %
Eosinophils Absolute: 0 10*3/uL (ref 0.0–0.5)
Eosinophils Relative: 0 %
HEMATOCRIT: 29.9 % — AB (ref 36.0–46.0)
Hemoglobin: 9.8 g/dL — ABNORMAL LOW (ref 12.0–15.0)
Immature Granulocytes: 0 %
LYMPHS ABS: 0.4 10*3/uL — AB (ref 0.7–4.0)
Lymphocytes Relative: 97 %
MCH: 29.4 pg (ref 26.0–34.0)
MCHC: 32.8 g/dL (ref 30.0–36.0)
MCV: 89.8 fL (ref 80.0–100.0)
MONO ABS: 0 10*3/uL — AB (ref 0.1–1.0)
Monocytes Relative: 0 %
Neutro Abs: 0 10*3/uL — ABNORMAL LOW (ref 1.7–7.7)
Neutrophils Relative %: 3 %
Platelets: 15 10*3/uL — CL (ref 150–400)
RBC: 3.33 MIL/uL — ABNORMAL LOW (ref 3.87–5.11)
RDW: 14.9 % (ref 11.5–15.5)
WBC: 0.4 10*3/uL — CL (ref 4.0–10.5)
nRBC: 0 % (ref 0.0–0.2)

## 2018-02-05 LAB — COMPREHENSIVE METABOLIC PANEL
ALT: 30 U/L (ref 0–44)
AST: 16 U/L (ref 15–41)
Albumin: 3.5 g/dL (ref 3.5–5.0)
Alkaline Phosphatase: 149 U/L — ABNORMAL HIGH (ref 38–126)
Anion gap: 11 (ref 5–15)
BUN: 40 mg/dL — AB (ref 6–20)
CO2: 23 mmol/L (ref 22–32)
Calcium: 8.7 mg/dL — ABNORMAL LOW (ref 8.9–10.3)
Chloride: 104 mmol/L (ref 98–111)
Creatinine, Ser: 1.07 mg/dL — ABNORMAL HIGH (ref 0.44–1.00)
GFR calc Af Amer: >60 mL/min (ref >60)
GFR calc non Af Amer: 57 mL/min — ABNORMAL LOW (ref >60)
Glucose, Bld: 201 mg/dL — ABNORMAL HIGH (ref 70–99)
Potassium: 3.9 mmol/L (ref 3.5–5.1)
SODIUM: 138 mmol/L (ref 135–145)
Total Bilirubin: 1.3 mg/dL — ABNORMAL HIGH (ref 0.3–1.2)
Total Protein: 5.5 g/dL — ABNORMAL LOW (ref 6.5–8.1)

## 2018-02-05 LAB — URIC ACID: URIC ACID, SERUM: 1.8 mg/dL — AB (ref 2.5–7.1)

## 2018-02-05 MED ORDER — SODIUM CHLORIDE 0.9% IV SOLUTION
250.0000 mL | Freq: Once | INTRAVENOUS | Status: AC
  Administered 2018-02-05: 250 mL via INTRAVENOUS

## 2018-02-05 MED ORDER — SODIUM CHLORIDE 0.9% FLUSH
10.0000 mL | Freq: Once | INTRAVENOUS | Status: AC
  Administered 2018-02-05: 10 mL via INTRAVENOUS

## 2018-02-05 MED ORDER — DIPHENHYDRAMINE HCL 25 MG PO CAPS
25.0000 mg | ORAL_CAPSULE | Freq: Once | ORAL | Status: AC
  Administered 2018-02-05: 25 mg via ORAL
  Filled 2018-02-05: qty 1

## 2018-02-05 MED ORDER — ACETAMINOPHEN 325 MG PO TABS
650.0000 mg | ORAL_TABLET | Freq: Once | ORAL | Status: AC
  Administered 2018-02-05: 650 mg via ORAL
  Filled 2018-02-05: qty 2

## 2018-02-05 MED ORDER — HEPARIN SOD (PORK) LOCK FLUSH 100 UNIT/ML IV SOLN
500.0000 [IU] | Freq: Once | INTRAVENOUS | Status: AC
  Administered 2018-02-05: 250 [IU] via INTRAVENOUS
  Filled 2018-02-05: qty 5

## 2018-02-05 NOTE — Progress Notes (Signed)
Pt presents today for PICC flush and lab draw today. VSS. Pt has no complaints of pain or any changes since the last visit. MAR reviewed with pt. PICC line flushed with 24mls of NS. Blood return noted. Dressing change performed.   Pt to received 1 unit of platelets per Dr. Delton Coombes.   1 unit of platelets administered today per MD orders. Tolerated infusion without adverse affects. Vital signs stable. No complaints at this time. Discharged from clinic ambulatory. F/U with [facility] as scheduled.

## 2018-02-05 NOTE — Progress Notes (Signed)
CRITICAL VALUE ALERT  Critical Value:  WBC 0.4; platelets 15  Date & Time Notied:  02/05/2018 at 1005  Provider Notified: Dr. Delton Coombes  Orders Received/Actions taken: transfuse one unit platelets

## 2018-02-05 NOTE — Patient Instructions (Signed)
[facility]  Discharge Instructions:   _______________________________________________________________  Thank you for choosing [facility] at [facility] to provide your oncology and hematology care.  To afford each patient quality time with our providers, please arrive at least 15 minutes before your scheduled appointment.  You need to re-schedule your appointment if you arrive 10 or more minutes late.  We strive to give you quality time with our providers, and arriving late affects you and other patients whose appointments are after yours.  Also, if you no show three or more times for appointments you may be dismissed from the clinic.  Again, thank you for choosing [facility]. Our hope is that these requests will allow you access to exceptional care and in a timely manner. _______________________________________________________________  If you have questions after your visit, please contact our office at [phone] between the hours of 8:30 a.m. and 5:00 p.m. Voicemails left after 4:30 p.m. will not be returned until the following business day. _______________________________________________________________  For prescription refill requests, have your pharmacy contact our office. _______________________________________________________________  Recommendations made by the consultant and any test results will be sent to your referring physician. _______________________________________________________________ 

## 2018-02-06 LAB — PREPARE PLATELET PHERESIS: Unit division: 0

## 2018-02-06 LAB — BPAM PLATELET PHERESIS
Blood Product Expiration Date: 202001232359
ISSUE DATE / TIME: 202001231206
Unit Type and Rh: 7300

## 2018-02-07 ENCOUNTER — Inpatient Hospital Stay (HOSPITAL_COMMUNITY)
Admission: EM | Admit: 2018-02-07 | Discharge: 2018-02-10 | DRG: 872 | Disposition: A | Discharge status: Home / Self Care | Payer: Commercial Managed Care - PPO | Attending: Student | Admitting: Student

## 2018-02-07 ENCOUNTER — Observation Stay (HOSPITAL_COMMUNITY): Payer: Commercial Managed Care - PPO

## 2018-02-07 ENCOUNTER — Encounter (HOSPITAL_COMMUNITY): Payer: Self-pay | Admitting: Emergency Medicine

## 2018-02-07 ENCOUNTER — Emergency Department (HOSPITAL_COMMUNITY): Payer: Commercial Managed Care - PPO

## 2018-02-07 ENCOUNTER — Other Ambulatory Visit: Payer: Self-pay

## 2018-02-07 DIAGNOSIS — R1114 Bilious vomiting: Secondary | ICD-10-CM

## 2018-02-07 DIAGNOSIS — I10 Essential (primary) hypertension: Secondary | ICD-10-CM | POA: Diagnosis not present

## 2018-02-07 DIAGNOSIS — E1165 Type 2 diabetes mellitus with hyperglycemia: Secondary | ICD-10-CM | POA: Diagnosis not present

## 2018-02-07 DIAGNOSIS — Z794 Long term (current) use of insulin: Secondary | ICD-10-CM

## 2018-02-07 DIAGNOSIS — E440 Moderate protein-calorie malnutrition: Secondary | ICD-10-CM | POA: Diagnosis not present

## 2018-02-07 DIAGNOSIS — C8440 Peripheral T-cell lymphoma, not classified, unspecified site: Secondary | ICD-10-CM | POA: Diagnosis not present

## 2018-02-07 DIAGNOSIS — E872 Acidosis: Secondary | ICD-10-CM | POA: Diagnosis present

## 2018-02-07 DIAGNOSIS — Z79899 Other long term (current) drug therapy: Secondary | ICD-10-CM | POA: Diagnosis not present

## 2018-02-07 DIAGNOSIS — E039 Hypothyroidism, unspecified: Secondary | ICD-10-CM | POA: Diagnosis present

## 2018-02-07 DIAGNOSIS — N179 Acute kidney failure, unspecified: Secondary | ICD-10-CM | POA: Diagnosis not present

## 2018-02-07 DIAGNOSIS — Z880 Allergy status to penicillin: Secondary | ICD-10-CM | POA: Diagnosis not present

## 2018-02-07 DIAGNOSIS — D61818 Other pancytopenia: Secondary | ICD-10-CM | POA: Diagnosis not present

## 2018-02-07 DIAGNOSIS — R5081 Fever presenting with conditions classified elsewhere: Secondary | ICD-10-CM | POA: Diagnosis present

## 2018-02-07 DIAGNOSIS — Z85828 Personal history of other malignant neoplasm of skin: Secondary | ICD-10-CM

## 2018-02-07 DIAGNOSIS — A419 Sepsis, unspecified organism: Secondary | ICD-10-CM | POA: Diagnosis not present

## 2018-02-07 DIAGNOSIS — I959 Hypotension, unspecified: Secondary | ICD-10-CM | POA: Diagnosis not present

## 2018-02-07 DIAGNOSIS — C8447 Peripheral T-cell lymphoma, not classified, spleen: Secondary | ICD-10-CM | POA: Diagnosis present

## 2018-02-07 DIAGNOSIS — D709 Neutropenia, unspecified: Secondary | ICD-10-CM | POA: Diagnosis present

## 2018-02-07 DIAGNOSIS — Z6820 Body mass index (BMI) 20.0-20.9, adult: Secondary | ICD-10-CM

## 2018-02-07 DIAGNOSIS — E876 Hypokalemia: Secondary | ICD-10-CM | POA: Diagnosis present

## 2018-02-07 DIAGNOSIS — D701 Agranulocytosis secondary to cancer chemotherapy: Secondary | ICD-10-CM

## 2018-02-07 DIAGNOSIS — Z881 Allergy status to other antibiotic agents status: Secondary | ICD-10-CM | POA: Diagnosis not present

## 2018-02-07 DIAGNOSIS — Z0189 Encounter for other specified special examinations: Secondary | ICD-10-CM

## 2018-02-07 DIAGNOSIS — K56600 Partial intestinal obstruction, unspecified as to cause: Secondary | ICD-10-CM | POA: Diagnosis present

## 2018-02-07 DIAGNOSIS — T451X5A Adverse effect of antineoplastic and immunosuppressive drugs, initial encounter: Secondary | ICD-10-CM | POA: Diagnosis not present

## 2018-02-07 DIAGNOSIS — Z882 Allergy status to sulfonamides status: Secondary | ICD-10-CM

## 2018-02-07 DIAGNOSIS — D696 Thrombocytopenia, unspecified: Secondary | ICD-10-CM

## 2018-02-07 DIAGNOSIS — K56609 Unspecified intestinal obstruction, unspecified as to partial versus complete obstruction: Secondary | ICD-10-CM

## 2018-02-07 LAB — COMPREHENSIVE METABOLIC PANEL
ALT: 17 U/L (ref 0–44)
AST: 11 U/L — ABNORMAL LOW (ref 15–41)
Albumin: 3.5 g/dL (ref 3.5–5.0)
Alkaline Phosphatase: 124 U/L (ref 38–126)
Anion gap: 15 (ref 5–15)
BUN: 53 mg/dL — ABNORMAL HIGH (ref 6–20)
CALCIUM: 9.4 mg/dL (ref 8.9–10.3)
CO2: 24 mmol/L (ref 22–32)
Chloride: 92 mmol/L — ABNORMAL LOW (ref 98–111)
Creatinine, Ser: 1.74 mg/dL — ABNORMAL HIGH (ref 0.44–1.00)
GFR calc Af Amer: 37 mL/min — ABNORMAL LOW (ref >60)
GFR calc non Af Amer: 32 mL/min — ABNORMAL LOW (ref >60)
Glucose, Bld: 340 mg/dL — ABNORMAL HIGH (ref 70–99)
Potassium: 3.9 mmol/L (ref 3.5–5.1)
Sodium: 131 mmol/L — ABNORMAL LOW (ref 135–145)
Total Bilirubin: 1.3 mg/dL — ABNORMAL HIGH (ref 0.3–1.2)
Total Protein: 6.5 g/dL (ref 6.5–8.1)

## 2018-02-07 LAB — TYPE AND SCREEN
ABO/RH(D): B POS
Antibody Screen: NEGATIVE

## 2018-02-07 LAB — CBC WITH DIFFERENTIAL/PLATELET
Abs Immature Granulocytes: 0 10*3/uL (ref 0.00–0.07)
Basophils Absolute: 0 10*3/uL (ref 0.0–0.1)
Basophils Relative: 0 %
Eosinophils Absolute: 0 10*3/uL (ref 0.0–0.5)
Eosinophils Relative: 0 %
HEMATOCRIT: 29.8 % — AB (ref 36.0–46.0)
HEMOGLOBIN: 9.9 g/dL — AB (ref 12.0–15.0)
Immature Granulocytes: 0 %
Lymphocytes Relative: 92 %
Lymphs Abs: 0.2 10*3/uL — ABNORMAL LOW (ref 0.7–4.0)
MCH: 28.9 pg (ref 26.0–34.0)
MCHC: 33.2 g/dL (ref 30.0–36.0)
MCV: 86.9 fL (ref 80.0–100.0)
MONO ABS: 0 10*3/uL — AB (ref 0.1–1.0)
Monocytes Relative: 4 %
Neutro Abs: 0 10*3/uL — ABNORMAL LOW (ref 1.7–7.7)
Neutrophils Relative %: 4 %
Platelets: 8 10*3/uL — CL (ref 150–400)
RBC: 3.43 MIL/uL — ABNORMAL LOW (ref 3.87–5.11)
RDW: 14.7 % (ref 11.5–15.5)
WBC: 0.3 10*3/uL — CL (ref 4.0–10.5)
nRBC: 0 % (ref 0.0–0.2)

## 2018-02-07 LAB — MAGNESIUM: Magnesium: 1.8 mg/dL (ref 1.7–2.4)

## 2018-02-07 LAB — TROPONIN I

## 2018-02-07 LAB — GRAM STAIN

## 2018-02-07 LAB — PROTIME-INR
INR: 1.14
Prothrombin Time: 14.5 seconds (ref 11.4–15.2)

## 2018-02-07 LAB — LIPASE, BLOOD: Lipase: 19 U/L (ref 11–51)

## 2018-02-07 LAB — CBG MONITORING, ED: GLUCOSE-CAPILLARY: 217 mg/dL — AB (ref 70–99)

## 2018-02-07 LAB — LACTIC ACID, PLASMA
Lactic Acid, Venous: 3 mmol/L (ref 0.5–1.9)
Lactic Acid, Venous: 1.4 mmol/L (ref 0.5–1.9)

## 2018-02-07 LAB — GLUCOSE, CAPILLARY: GLUCOSE-CAPILLARY: 221 mg/dL — AB (ref 70–99)

## 2018-02-07 MED ORDER — SODIUM CHLORIDE 0.9 % IV BOLUS (SEPSIS)
1000.0000 mL | Freq: Once | INTRAVENOUS | Status: AC
  Administered 2018-02-07: 1000 mL via INTRAVENOUS

## 2018-02-07 MED ORDER — SODIUM CHLORIDE 0.9 % IV BOLUS (SEPSIS)
250.0000 mL | Freq: Once | INTRAVENOUS | Status: AC
  Administered 2018-02-07: 250 mL via INTRAVENOUS

## 2018-02-07 MED ORDER — TBO-FILGRASTIM 300 MCG/0.5ML ~~LOC~~ SOSY
300.0000 ug | PREFILLED_SYRINGE | Freq: Every day | SUBCUTANEOUS | Status: DC
  Filled 2018-02-07 (×2): qty 0.5

## 2018-02-07 MED ORDER — INSULIN ASPART 100 UNIT/ML ~~LOC~~ SOLN
0.0000 [IU] | Freq: Three times a day (TID) | SUBCUTANEOUS | Status: DC
  Administered 2018-02-08: 2 [IU] via SUBCUTANEOUS
  Administered 2018-02-08 – 2018-02-10 (×5): 1 [IU] via SUBCUTANEOUS

## 2018-02-07 MED ORDER — ONDANSETRON HCL 4 MG/2ML IJ SOLN: 4.0000 mg | Freq: Four times a day (QID) | INTRAMUSCULAR | Status: DC | PRN

## 2018-02-07 MED ORDER — SODIUM CHLORIDE 0.9 % IV BOLUS (SEPSIS)
500.0000 mL | Freq: Once | INTRAVENOUS | Status: AC
  Administered 2018-02-07: 500 mL via INTRAVENOUS

## 2018-02-07 MED ORDER — SODIUM CHLORIDE 0.9 % IV SOLN
INTRAVENOUS | Status: AC
  Administered 2018-02-07: 22:00:00 via INTRAVENOUS

## 2018-02-07 MED ORDER — FILGRASTIM 300 MCG/ML IJ SOLN
300.0000 ug | Freq: Once | INTRAMUSCULAR | Status: AC
  Administered 2018-02-07: 300 ug via SUBCUTANEOUS
  Filled 2018-02-07: qty 1

## 2018-02-07 MED ORDER — VITAMIN B-12 1000 MCG PO TABS
1000.0000 ug | ORAL_TABLET | Freq: Every day | ORAL | Status: DC
  Administered 2018-02-09 – 2018-02-10 (×2): 1000 ug via ORAL
  Filled 2018-02-07 (×4): qty 1

## 2018-02-07 MED ORDER — SODIUM CHLORIDE 0.9 % IV SOLN
500.0000 mg | Freq: Two times a day (BID) | INTRAVENOUS | Status: DC
  Administered 2018-02-07 – 2018-02-08 (×2): 500 mg via INTRAVENOUS
  Filled 2018-02-07: qty 0.5
  Filled 2018-02-07: qty 500
  Filled 2018-02-07: qty 0.5

## 2018-02-07 MED ORDER — ACETAMINOPHEN 500 MG PO TABS
ORAL_TABLET | ORAL | Status: AC
  Administered 2018-02-07: 1000 mg via ORAL
  Filled 2018-02-07: qty 2

## 2018-02-07 MED ORDER — SODIUM CHLORIDE 0.9% FLUSH
3.0000 mL | Freq: Once | INTRAVENOUS | Status: AC
  Administered 2018-02-07: 3 mL via INTRAVENOUS

## 2018-02-07 MED ORDER — VITAMIN C 500 MG PO TABS
500.0000 mg | ORAL_TABLET | Freq: Two times a day (BID) | ORAL | Status: DC
  Administered 2018-02-07 – 2018-02-10 (×4): 500 mg via ORAL
  Filled 2018-02-07 (×8): qty 1

## 2018-02-07 MED ORDER — FLUCONAZOLE 100 MG PO TABS: 100.0000 mg | ORAL_TABLET | Freq: Every day | ORAL | Status: DC

## 2018-02-07 MED ORDER — ACETAMINOPHEN 650 MG RE SUPP: 650.0000 mg | Freq: Four times a day (QID) | RECTAL | Status: DC | PRN

## 2018-02-07 MED ORDER — SODIUM CHLORIDE 0.9% IV SOLUTION: Freq: Once | INTRAVENOUS | Status: DC

## 2018-02-07 MED ORDER — LEVOTHYROXINE SODIUM 50 MCG PO TABS: 75.0000 ug | ORAL_TABLET | Freq: Every day | ORAL | Status: DC

## 2018-02-07 MED ORDER — ONDANSETRON HCL 4 MG PO TABS: 4.0000 mg | ORAL_TABLET | Freq: Four times a day (QID) | ORAL | Status: DC | PRN

## 2018-02-07 MED ORDER — POLYETHYLENE GLYCOL 3350 17 G PO PACK: 17.0000 g | PACK | Freq: Every day | ORAL | Status: DC | PRN

## 2018-02-07 MED ORDER — VALACYCLOVIR HCL 500 MG PO TABS: 500.0000 mg | ORAL_TABLET | Freq: Every day | ORAL | Status: DC

## 2018-02-07 MED ORDER — ACETAMINOPHEN 500 MG PO TABS
1000.0000 mg | ORAL_TABLET | Freq: Once | ORAL | Status: AC
  Administered 2018-02-07: 1000 mg via ORAL

## 2018-02-07 MED ORDER — ACETAMINOPHEN 325 MG PO TABS: 650.0000 mg | ORAL_TABLET | Freq: Four times a day (QID) | ORAL | Status: DC | PRN

## 2018-02-07 MED ORDER — FLUCONAZOLE 100MG IVPB
100.0000 mg | INTRAVENOUS | Status: DC
  Administered 2018-02-08 – 2018-02-10 (×3): 100 mg via INTRAVENOUS
  Filled 2018-02-07 (×5): qty 50

## 2018-02-07 MED ORDER — ONDANSETRON HCL 4 MG/2ML IJ SOLN
4.0000 mg | Freq: Once | INTRAMUSCULAR | Status: AC
  Administered 2018-02-07: 4 mg via INTRAVENOUS
  Filled 2018-02-07: qty 2

## 2018-02-07 MED ORDER — SODIUM CHLORIDE 0.9 % IV SOLN
10.0000 mL/h | Freq: Once | INTRAVENOUS | Status: AC
  Administered 2018-02-07: 10 mL/h via INTRAVENOUS

## 2018-02-07 MED ORDER — SODIUM CHLORIDE 0.9 % IV BOLUS
1000.0000 mL | Freq: Once | INTRAVENOUS | Status: AC
  Administered 2018-02-07: 1000 mL via INTRAVENOUS

## 2018-02-07 NOTE — Progress Notes (Signed)
Pharmacy Antibiotic Note  Kristin Good is a 58 y.o. female admitted on 02/07/2018 with surgical prophylaxis and febrile neutropenia.  Pharmacy has been consulted for merrem dosing.  Plan: merrem 500mg  iv q12h  Height: 5\' 3"  (160 cm) Weight: 115 lb (52.2 kg) IBW/kg (Calculated) : 52.4  Temp (24hrs), Avg:100.6 F (38.1 C), Min:98.3 F (36.8 C), Max:102 F (38.9 C)  Recent Labs  Lab 02/02/18 1507 02/05/18 0914 02/07/18 1445 02/07/18 1616  WBC 1.2* 0.4* 0.3*  --   CREATININE 1.09* 1.07* 1.74*  --   LATICACIDVEN  --   --  3.0* 1.4    Estimated Creatinine Clearance: 28.7 mL/min (A) (by C-G formula based on SCr of 1.74 mg/dL (H)).    Allergies  Allergen Reactions  . Doxycycline Anaphylaxis  . Penicillins Anaphylaxis    Did it involve swelling of the face/tongue/throat, SOB, or low BP? Yes-SOB Did it involve sudden or severe rash/hives, skin peeling, or any reaction on the inside of your mouth or nose? Unknown Did you need to seek medical attention at a hospital or doctor's office? Yes When did it last happen?Over 10 years ago If all above answers are "NO", may proceed with cephalosporin use.   . Levofloxacin Nausea And Vomiting  . Sulfa Antibiotics Itching and Rash    Antimicrobials this admission: 1/25 merrem >>  1/25 fluconazole >>    Microbiology results: 1/25 BCx: sent 1/25 UCx: sent   Thank you for allowing pharmacy to be a part of this patient's care.  Donna Christen Kaetlyn Noa 02/07/2018 8:44 PM

## 2018-02-07 NOTE — ED Notes (Signed)
Order changed per pharmacy request so Neupogen would scan.

## 2018-02-07 NOTE — H&P (Signed)
History and Physical    Kristin Good [facility]:[id number] DOB: 15-Feb-1960 DOA: 02/07/2018  PCP: Marinda Elk, MD   Patient coming from: Home  I have personally briefly reviewed patient's old medical records in [address]  Chief Complaint: Vomiting, Abdominal pain  HPI: Kristin Good is a 58 y.o. female with medical history significant for recent diagnosis of T-cell lymphoma, HTN, IBS, who presented to ED with complaints of 4-5 episodes of vomiting since last night, also with intermittent generalized sharp abdominal pain.  She reports loose stools but not watery twice yesterday and twice today.  No fever or chills at home.  No burning with urination.  No difficulty breathing, dry cough over the.  No headache or neck stiffness or pain.  Patient was started on prophylactic ciprofloxacin, valacyclovir, and Diflucan, which she is compliant with.  ED Course: Temperature 100.6.  Hypotension-71/56. Tachycardic to 130.  Markedly low WBC 0.3 and platelets 8. Hgb 9.9. Lactic acid 3.0.  Sodium low 131.  Creatinine therapeutic 1.7.  Portable chest x-ray negative for acute abnormality.  UA pending.  ED provider talked with oncologist on-call Dr. Lindi Adie, who recommended admission to hospitalist service did not feel strongly either way about admission here or transfer, transfuse platelets, give Granix, broad-spectrum antibiotics.   Review of Systems: As per HPI all other systems reviewed and negative  Past Medical History:  Diagnosis Date  . Diabetes ([place]) 01/20/2018  . HTN (hypertension)   . Hypothyroidism   . Leukemia ([place])    Atypical T cell lymphoma    Past Surgical History:  Procedure Laterality Date  . COLONOSCOPY  07/27/2010   Procedure: COLONOSCOPY;  Surgeon: Dorothyann Peng, MD;  Location: [facility];  Service: Endoscopy;  Laterality: N/A;  . infertility surgery    . MOHS SURGERY     basal cell carcinoma  . REDUCTION MAMMAPLASTY Bilateral 2002     reports that she has never  smoked. She has never used smokeless tobacco. She reports current alcohol use. She reports that she does not use drugs.  Allergies  Allergen Reactions  . Doxycycline Anaphylaxis  . Penicillins Anaphylaxis    Did it involve swelling of the face/tongue/throat, SOB, or low BP? Yes-SOB Did it involve sudden or severe rash/hives, skin peeling, or any reaction on the inside of your mouth or nose? Unknown Did you need to seek medical attention at a hospital or doctor's office? Yes When did it last happen?Over 10 years ago If all above answers are "NO", may proceed with cephalosporin use.   . Levofloxacin Nausea And Vomiting  . Sulfa Antibiotics Itching and Rash    Family History  Problem Relation Age of Onset  . Heart disease Mother   . Diabetes Father   . Breast cancer Sister 57  . Breast cancer Maternal Aunt   . Colon cancer Neg Hx   . Liver disease Neg Hx     Prior to Admission medications   Medication Sig Start Date End Date Taking? Authorizing Provider  atorvastatin (LIPITOR) 20 MG tablet Take 1 tablet by mouth every morning.  04/17/17  Yes [provider]  calcium-vitamin D (CALCIUM 500+D HIGH POTENCY) 500-400 MG-UNIT tablet Take 1 tablet by mouth 2 (two) times daily.   Yes [provider]  ciprofloxacin (CIPRO) 500 MG tablet Take 1 tablet (500 mg total) by mouth 2 (two) times daily. 01/28/18  Yes Derek Jack, MD  cyclophosphamide in sodium chloride 0.9 % 250 mL Inject 1,260 mg into the  vein every 21 ( twenty-one) days.   Yes [provider]  DOXOrubicin HCl (ADRIAMYCIN IV) Inject 84 mg into the vein every 21 ( twenty-one) days.   Yes [provider]  ETOPOSIDE IV Inject 170 mg into the vein every 21 ( twenty-one) days. Days 1-3   Yes [provider]  fluconazole (DIFLUCAN) 100 MG tablet Take 1 tablet (100 mg total) by mouth daily. 01/28/18  Yes Derek Jack, MD  Glucosamine-Chondroit-Vit C-Mn (GLUCOSAMINE 1500 COMPLEX  PO) Take 1 capsule by mouth 2 (two) times daily.   Yes [provider]  levothyroxine (SYNTHROID, LEVOTHROID) 75 MCG tablet Take 75 mcg by mouth daily before breakfast.  10/17/17  Yes [provider]  metFORMIN (GLUCOPHAGE) 1000 MG tablet Take 1 tablet by mouth 2 (two) times daily. 10/17/17  Yes [provider]  pegfilgrastim (NEULASTA) 6 MG/0.6ML injection Inject 6 mg into the skin See admin instructions. To be administered 3 days after chemo therapy treatment   Yes [provider]  prochlorperazine (COMPAZINE) 10 MG tablet Take 1 tablet (10 mg total) by mouth every 6 (six) hours as needed for nausea or vomiting. 01/28/18  Yes Derek Jack, MD  triamterene-hydrochlorothiazide (MAXZIDE-25) 37.5-25 MG per tablet Take 1 tablet by mouth daily.     Yes [provider]  valACYclovir (VALTREX) 500 MG tablet Take 1 tablet (500 mg total) by mouth daily. 01/28/18  Yes Derek Jack, MD  vinCRIStine 2 mg in sodium chloride 0.9 % 50 mL Inject 2 mg into the vein every 21 ( twenty-one) days.   Yes [provider]  vitamin B-12 (CYANOCOBALAMIN) 1000 MCG tablet Take 1 tablet (1,000 mcg total) by mouth daily. 01/24/18  Yes Gherghe, Vella Redhead, MD  vitamin C (ASCORBIC ACID) 500 MG tablet Take 500 mg by mouth 2 (two) times daily.   Yes [provider]  furosemide (LASIX) 20 MG tablet Take 1 tablet (20 mg total) by mouth daily as needed. Patient taking differently: Take 20 mg by mouth daily as needed for fluid.  01/28/18   Derek Jack, MD  insulin lispro (HUMALOG) 100 UNIT/ML injection Check blood sugars three times a day and administer Humalog insulin subcutaneously as ordered:  151-200 inject 2 units; 201-250 inject 4 units; 251-300 inject 6 units; greater than or equal to 301 inject 8 units and call MD Patient not taking: Reported on 02/02/2018 01/29/18   Francene Finders L, NP-C  predniSONE (DELTASONE) 20 MG tablet Take 5 tablets (100 mg  total) by mouth daily. Take on days 1-5 of chemotherapy. 01/28/18   Derek Jack, MD    Physical Exam: Vitals:   02/07/18 1418 02/07/18 1433 02/07/18 1500 02/07/18 1600  BP: (!) 71/56 (!) 84/69 95/67 108/61  Pulse: (!) 133  (!) 123 (!) 108  Resp: 18 (!) 22 (!) 26 (!) 23  Temp: 98.3 F (36.8 C)     TempSrc: Oral     SpO2: 98%  96% 100%  Weight:      Height:        Constitutional: Chronically ill-appearing calm, comfortable Vitals:   02/07/18 1418 02/07/18 1433 02/07/18 1500 02/07/18 1600  BP: (!) 71/56 (!) 84/69 95/67 108/61  Pulse: (!) 133  (!) 123 (!) 108  Resp: 18 (!) 22 (!) 26 (!) 23  Temp: 98.3 F (36.8 C)     TempSrc: Oral     SpO2: 98%  96% 100%  Weight:      Height:       Eyes: PERRL,  lids and conjunctivae normal ENMT: Mucous membranes are moist. Posterior pharynx clear of any exudate or lesions. Neck: normal, supple, no masses, no thyromegaly Respiratory: clear to auscultation bilaterally, no wheezing, no crackles. Normal respiratory effort. No accessory muscle use.  Cardiovascular: Regular rate and rhythm, no murmurs / rubs / gallops. No extremity edema. 2+ pedal pulses. No carotid bruits.  Abdomen: Minimal guarding without obvious tenderness, no masses palpated. No hepatosplenomegaly. Bowel sounds positive.  Musculoskeletal: no clubbing / cyanosis. No joint deformity upper and lower extremities. Good ROM, no contractures. Normal muscle tone. PICC line right under-arm, no tende5rness or erythema, no drainage.  Skin: no rashes, lesions, ulcers. No induration Neurologic: CN 2-12 grossly intact.  Strength 5/5 in all 4.  Psychiatric: Normal judgment and insight. Alert and oriented x 3. Normal mood.   Labs on Admission: I have personally reviewed following labs and imaging studies  CBC: Recent Labs  Lab 02/02/18 1507 02/05/18 0914 02/07/18 1445  WBC 1.2* 0.4* 0.3*  NEUTROABS 0.2* 0.0* 0.0*  HGB 10.4* 9.8* 9.9*  HCT 32.0* 29.9* 29.8*  MCV 90.7 89.8 86.9    PLT 39* 15* 8*   Basic Metabolic Panel: Recent Labs  Lab 02/02/18 1507 02/05/18 0914 02/07/18 1445  NA 136 138 131*  K 3.7 3.9 3.9  CL 104 104 92*  CO2 21* 23 24  GLUCOSE 256* 201* 340*  BUN 50* 40* 53*  CREATININE 1.09* 1.07* 1.74*  CALCIUM 8.7* 8.7* 9.4  MG 2.3  --   --   PHOS 4.7*  --   --     Recent Labs  Lab 02/02/18 1507 02/05/18 0914 02/07/18 1445  AST 37 16 11*  ALT 58* 30 17  ALKPHOS 194* 149* 124  BILITOT 0.8 1.3* 1.3*  PROT 5.3* 5.5* 6.5  ALBUMIN 3.4* 3.5 3.5   Recent Labs  Lab 02/07/18 1455  LIPASE 19   Coagulation Profile: Recent Labs  Lab 02/07/18 1445  INR 1.14   Cardiac Enzymes: Recent Labs  Lab 02/07/18 1455  TROPONINI <0.03   CBG: Recent Labs  Lab 02/02/18 1110  GLUCAP 94    Radiological Exams on Admission: Ct Abdomen Pelvis Wo Contrast  Result Date: 02/07/2018 CLINICAL DATA:  History of T-cell lymphoma with recent vomiting EXAM: CT ABDOMEN AND PELVIS WITHOUT CONTRAST TECHNIQUE: Multidetector CT imaging of the abdomen and pelvis was performed following the standard protocol without IV contrast. COMPARISON:  PET-CT from 02/02/2018, CT from 01/20/2018 FINDINGS: Lower chest: No acute abnormality. Hepatobiliary: Liver is within normal limits. The gallbladder is decompressed. Previously seen wall thickening in the gallbladder on CT has resolved in the interval. Small hypodensity is noted in the lateral segment of the left lobe of the liver consistent with a tiny cyst. Pancreas: Unremarkable. No pancreatic ductal dilatation or surrounding inflammatory changes. Spleen: Spleen is enlarged similar to that seen on prior PET-CT although no focal mass is noted. Adrenals/Urinary Tract: Adrenal glands are within normal limits. Kidneys are well visualized bilaterally. Nonobstructing renal calculi are seen bilaterally. The collecting systems and ureters are within normal limits. The bladder is well distended. Stable densities are noted at the base of the  bladder stable from previous exams. Stomach/Bowel: Multiple dilated loops of jejunum are identified increased when compared with the prior PET-CT from 4 days previous. Fecalization of small bowel contents is noted within the proximal ileum. Transition zone is noted deep within the pelvis centrally although no focal mass lesion is noted. These changes are likely related to underlying adhesions. The  more distal ileum is within normal limits. The colon shows fecal material within. The appendix is within normal limits. Vascular/Lymphatic: Aortic atherosclerosis. No enlarged abdominal or pelvic lymph nodes. Reproductive: Uterus and bilateral adnexa are unremarkable. Other: No abdominal wall hernia or abnormality. No abdominopelvic ascites. Musculoskeletal: Degenerative changes of lumbar spine are noted. No acute bony abnormality is seen. IMPRESSION: New proximal small bowel obstruction extending from the proximal jejunum into the proximal ileum. A transition zone is noted deep in the pelvis although incompletely evaluated without oral or IV contrast. This is likely related to adhesions given a relatively negative PET-CT from 4 days previous. The remainder of the exam is stable from the recent exams. Electronically Signed   By: Inez Catalina M.D.   On: 02/07/2018 19:19   Dg Chest Port 1 View  Result Date: 02/07/2018 CLINICAL DATA:  Emesis, cough, history of lymphoma EXAM: PORTABLE CHEST 1 VIEW COMPARISON:  02/02/2018 FINDINGS: Right-sided PICC line is noted in satisfactory position. Cardiac shadow is within normal limits. The lungs are well aerated without focal infiltrate. No bony abnormality is seen. IMPRESSION: No acute abnormality noted. Electronically Signed   By: Inez Catalina M.D.   On: 02/07/2018 15:58    EKG: Independently reviewed.   Assessment/Plan Active Problems:   AKI (acute kidney injury) ([place])  Acute kidney injury-Cr 1.7, baseline 0.8- 1.0. With hypotension, likely from poor p.o. intake 2/2  chemo and vomiting, with triamterene- HCTZ use. -Hydrate N/s 125cc/hr x 1 - BMP a.m  Febrile neutropenia-meeting SIRs criteria.  Lactic acid 3>1.4 after 2.7 L bolus IVF.  Portable chest x-ray negative for acute abnormality. PICC line area appears clean and not infected. - UA pending, urine cultures -Follow-up blood cultures -Continue broad-spectrum antibiotics with meropenem, IV fluconazole - Influenza panel  SBO- vomiting, Abdominal pain.  CT abdomen and pelvis wo contrast- new proximal small bowel obstruction extending from the proximal jejunum into proximal.  Transition zone noted deep in the pelvis incompletely evaluated without oral or IV contrast, likely related to adhesions. ? Bacteria translocation from GI in immunocompromise status. - NPO -Consulted general surgery- I talked to Dr. Barry Dienes, will see patient. - IVF - Meropenem IV  Pancytopenia-platelets markedly low at 8, WBC markedly low 0.3.  Secondary to T-cell lymphoma and chemo.  With fever, hypotension and lactic acidosis.  Recent transfusion of platelets 02/05/2018-without significantly improved.  EDP talked to the oncologist Dr. Lindi Adie on call-who recommended Granix, transfuse platelets, and meropenem.  -Transfuse 1 units of platelets- patient febrile 100.6 prior to transfusion,  Ordered tylenol and to continue transfuse. Temp increased to 102 during transfusion,  per protocol increase of >1 degree from baseline is labelled a transfusion reaction and transfusion has to be d/c. Re-ordered platelet transfusion. - Granix/neupogen given x 1 - Please re-consult oncology in the a.m.  T-cell lymphoma-recently diagnosed with recent hospital admission.  Received second course of chemotherapy. Follows with Dr. Delton Coombes. Has PICC line.  -Reconsult Oncology consult in a.m   DVT prophylaxis: Scds Code Status: Full Family Communication: Spouse at bedside Disposition Plan: per rounding team Consults called:  General surgery consulted,  please consult oncology in a.m Admission status: Obs, stepdown   Bethena Roys MD [facility]  02/07/2018, 11:26 PM

## 2018-02-07 NOTE — ED Provider Notes (Signed)
[facility] EMERGENCY DEPARTMENT Provider Note   CSN: [id number] Arrival date & time: 02/07/18  1407     History   Chief Complaint Chief Complaint  Patient presents with  . Emesis    HPI Kristin Good is a 58 y.o. female.  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  She is here with complaint of vomiting that started last night.  She has had 5 episodes of vomiting.  There is a little vague abdominal pain.  She feels nauseous.  There is been no fever.  She has had a cough that is been going on a couple months.  She was here 2 days ago for fluids.  The history is provided by the patient.  Emesis  Severity:  Severe Duration:  18 hours Timing:  Intermittent Number of daily episodes:  5 Quality:  Bilious material Progression:  Unchanged Chronicity:  New Recent urination:  Decreased Context: not post-tussive and not self-induced   Relieved by:  Nothing Worsened by:  Nothing Ineffective treatments:  Antiemetics Associated symptoms: cough   Associated symptoms: no abdominal pain, no arthralgias, no chills, no diarrhea, no fever, no headaches, no myalgias, no sore throat and no URI   Risk factors: no sick contacts and no travel to endemic areas     Past Medical History:  Diagnosis Date  . Diabetes ([place]) 01/20/2018  . HTN (hypertension)   . Hypothyroidism   . 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 Protein-calorie malnutrition, severe 01/20/2018  . Transaminasemia   . Sepsis due to undetermined organism ([place]) 01/18/2018  . Sepsis ([place]) 01/18/2018  . Pancytopenia ([place]) 01/18/2018  . Essential hypertension 01/18/2018  . AKI (acute kidney injury) ([place])   . Dehydration   . Transaminitis   . IBS (irritable bowel syndrome) 07/03/2010  . Colon cancer screening 07/03/2010    Past Surgical History:  Procedure Laterality  Date  . COLONOSCOPY  07/27/2010   Procedure: COLONOSCOPY;  Surgeon: Dorothyann Peng, MD;  Location: [facility];  Service: Endoscopy;  Laterality: N/A;  . infertility surgery    . MOHS SURGERY     basal cell carcinoma  . REDUCTION MAMMAPLASTY Bilateral 2002     OB History   No obstetric history on file.      Home Medications    Prior to Admission medications   Medication Sig Start Date End Date Taking? Authorizing Provider  atorvastatin (LIPITOR) 20 MG tablet Take 1 tablet by mouth every morning.  04/17/17   [provider]  atorvastatin (LIPITOR) 20 MG tablet TAKE 1 TABLET BY MOUTH ONCE DAILY FOR CHOLESTEROL 02/02/18   [provider]  calcium-vitamin D (CALCIUM 500+D HIGH POTENCY) 500-400 MG-UNIT tablet Take 1 tablet by mouth 2 (two) times daily.    [provider]  ciprofloxacin (CIPRO) 500 MG tablet Take 1 tablet (500 mg total) by mouth 2 (two) times daily. 01/28/18   Derek Jack, MD  cyclophosphamide in sodium chloride 0.9 % 250 mL Inject 1,260 mg into the vein every 21 ( twenty-one) days.    [provider]  DOXOrubicin HCl (ADRIAMYCIN IV) Inject 84 mg into the vein every 21 ( twenty-one) days.    [provider]  ETOPOSIDE IV Inject 170 mg into the vein every 21 ( twenty-one) days. Days 1-3    [provider]  fluconazole (DIFLUCAN) 100 MG tablet Take 1 tablet (100 mg total) by mouth daily. 01/28/18   Derek Jack, MD  furosemide (LASIX) 20 MG tablet Take 1 tablet (20 mg total) by mouth daily as needed. Patient taking differently: Take 20 mg by mouth daily as needed for fluid.  01/28/18   Derek Jack, MD  Glucosamine-Chondroit-Vit C-Mn (GLUCOSAMINE 1500 COMPLEX PO) Take 1 capsule by mouth 2 (two) times daily.    [provider]  insulin lispro (HUMALOG) 100 UNIT/ML injection Check blood sugars three times a day and administer Humalog insulin subcutaneously as ordered:  151-200 inject 2 units;  201-250 inject 4 units; 251-300 inject 6 units; greater than or equal to 301 inject 8 units and call MD Patient not taking: Reported on 02/02/2018 01/29/18   Glennie Isle, NP-C  levothyroxine (SYNTHROID, LEVOTHROID) 75 MCG tablet Take 75 mcg by mouth daily before breakfast.  10/17/17   [provider]  metFORMIN (GLUCOPHAGE) 1000 MG tablet Take 1 tablet by mouth 2 (two) times daily. 10/17/17   [provider]  pegfilgrastim (NEULASTA) 6 MG/0.6ML injection Inject 6 mg into the skin See admin instructions. To be administered 3 days after chemo therapy treatment    [provider]  predniSONE (DELTASONE) 20 MG tablet Take 5 tablets (100 mg total) by mouth daily. Take on days 1-5 of chemotherapy. 01/28/18   Derek Jack, MD  prochlorperazine (COMPAZINE) 10 MG tablet Take 1 tablet (10 mg total) by mouth every 6 (six) hours as needed for nausea or vomiting. 01/28/18   Derek Jack, MD  triamterene-hydrochlorothiazide (MAXZIDE-25) 37.5-25 MG per tablet Take 1 tablet by mouth daily.      [provider]  valACYclovir (VALTREX) 500 MG tablet Take 1 tablet (500 mg total) by mouth daily. 01/28/18   Derek Jack, MD  vinCRIStine 2 mg in sodium chloride 0.9 % 50 mL Inject 2 mg into the vein every 21 ( twenty-one) days.    [provider]  vitamin B-12 (CYANOCOBALAMIN) 1000 MCG tablet Take 1 tablet (1,000 mcg total) by mouth daily. 01/24/18   Caren Griffins, MD  vitamin C (ASCORBIC ACID) 500 MG tablet Take 500 mg by mouth 2 (two) times daily.    [provider]    Family History Family History  Problem Relation Age of Onset  . Heart disease Mother   . Diabetes Father   . Breast cancer Sister 75  . Breast cancer Maternal Aunt   . Colon cancer Neg Hx   . Liver disease Neg Hx     Social History Social History   Tobacco Use  . Smoking status: Never Smoker  . Smokeless tobacco: Never Used  Substance Use Topics  . Alcohol use:  Yes    Comment: once or twice a year  . Drug use: No     Allergies   Doxycycline; Penicillins; Levofloxacin; and Sulfa antibiotics   Review of Systems Review of Systems  Constitutional: Negative for chills and fever.  HENT: Negative for sore throat.   Eyes: Negative for visual disturbance.  Respiratory: Positive for cough. Negative for shortness of breath.   Cardiovascular: Negative for chest pain.  Gastrointestinal: Positive for vomiting. Negative for abdominal pain and diarrhea.  Genitourinary: Positive for decreased urine volume. Negative for dysuria and frequency.  Musculoskeletal: Negative for arthralgias and myalgias.  Skin: Negative for rash.  Neurological: Negative for seizures and headaches.     Physical Exam Updated Vital Signs BP (!) 71/56 (BP Location:  Left Arm)   Pulse (!) 133   Temp 98.3 F (36.8 C) (Oral)   Resp 18   Ht 5\' 3"  (1.6 m)   Wt 52.2 kg   SpO2 98%   BMI 20.37 kg/m   Physical Exam Vitals signs and nursing note reviewed.  Constitutional:      General: She is not in acute distress.    Appearance: She is well-developed.  HENT:     Head: Normocephalic and atraumatic.  Eyes:     Conjunctiva/sclera: Conjunctivae normal.  Neck:     Musculoskeletal: Neck supple.  Cardiovascular:     Rate and Rhythm: Regular rhythm. Tachycardia present.     Heart sounds: No murmur.  Pulmonary:     Effort: Pulmonary effort is normal. No respiratory distress.     Breath sounds: Normal breath sounds.  Abdominal:     General: There is distension.     Tenderness: There is no abdominal tenderness. There is no guarding or rebound.     Comments: She has tympany to abdominal percussion.  Musculoskeletal:        General: No tenderness or deformity.     Right lower leg: No edema.     Left lower leg: No edema.     Comments: The PICC line in her right upper arm no redness no tenderness.  Skin:    General: Skin is warm and dry.     Capillary Refill: Capillary refill  takes less than 2 seconds.  Neurological:     General: No focal deficit present.     Mental Status: She is alert and oriented to person, place, and time.     Sensory: No sensory deficit.     Motor: No weakness.      ED Treatments / Results  Labs (all labs ordered are listed, but only abnormal results are displayed) Labs Reviewed  CULTURE, BLOOD (ROUTINE X 2)  CULTURE, BLOOD (ROUTINE X 2)  COMPREHENSIVE METABOLIC PANEL  LACTIC ACID, PLASMA  LACTIC ACID, PLASMA  CBC WITH DIFFERENTIAL/PLATELET  PROTIME-INR  URINALYSIS, ROUTINE W REFLEX MICROSCOPIC  LIPASE, BLOOD  TROPONIN I    EKG None  Radiology No results found.  Procedures .Critical Care Performed by: Hayden Rasmussen, MD Authorized by: Hayden Rasmussen, MD   Critical care provider statement:    Critical care time (minutes):  45   Critical care time was exclusive of:  Separately billable procedures and treating other patients   Critical care was necessary to treat or prevent imminent or life-threatening deterioration of the following conditions:  Sepsis and shock   Critical care was time spent personally by me on the following activities:  Discussions with consultants, evaluation of patient's response to treatment, examination of patient, ordering and performing treatments and interventions, ordering and review of laboratory studies, ordering and review of radiographic studies, pulse oximetry, re-evaluation of patient's condition, obtaining history from patient or surrogate, review of old charts and development of treatment plan with patient or surrogate   I assumed direction of critical care for this patient from another provider in my specialty: no     (including critical care time)  Medications Ordered in ED Medications  sodium chloride flush (NS) 0.9 % injection 3 mL (has no administration in time range)  sodium chloride 0.9 % bolus 1,000 mL (has no administration in time range)    And  sodium chloride 0.9 %  bolus 500 mL (has no administration in time range)    And  sodium chloride 0.9 %  bolus 250 mL (has no administration in time range)     Initial Impression / Assessment and Plan / ED Course  I have reviewed the triage vital signs and the nursing notes.  Pertinent labs & imaging results that were available during my care of the patient were reviewed by me and considered in my medical decision making (see chart for details).  Clinical Course as of Feb 08 1939  Sat Feb 07, 2018  1512 EKG is sinus tachycardia rate of 129 LVH old anterior MI nonspecific ST-T changes.   [MB]  1611 Patient has had no further vomiting since arrival.  She denies any abdominal pain right now.  Currently she is received her fluid boluses and her heart rates down to 100 and pressures up over 100.   [MB]  1612 She is neutropenic and thrombocytopenic a little bit lower than her most recent tests.  Her creatinine is also risen to 174 along with an elevated glucose.  I put a call into oncology for recommendations   [MB]  1620 Discussed with Dr. Payton Mccallum oncology on-call.  He recommends that the patient be started on meropenem and platelets along with Gramix 300 mcg daily.  Continue IV fluids.   [MB]  4975 Discussed with [facility] hospitalist Dr. Denton Brick who will evaluate the patient for admission.   [MB]    Clinical Course User Index [MB] Hayden Rasmussen, MD     Final Clinical Impressions(s) / ED Diagnoses   Final diagnoses:  Bilious vomiting with nausea  Chemotherapy-induced neutropenia ([place])  Thrombocytopenia ([place])  AKI (acute kidney injury) [facility])    ED Discharge Orders    None       Hayden Rasmussen, MD 02/07/18 2010

## 2018-02-07 NOTE — ED Notes (Signed)
Dr Denton Brick called to unit. Per [place]okp[place], give tylenol then recheck temp in 10-15 minutes then hang platelets.

## 2018-02-07 NOTE — ED Notes (Signed)
Date and time results received: 02/07/18 1517 (use smartphrase ".now" to insert current time)  Test: Lactic Acid Critical Value: 3.0  Name of Provider Notified: Dr Melina Copa  Orders Received? Or Actions Taken?:NA

## 2018-02-07 NOTE — ED Triage Notes (Signed)
Pt is a cancer pt receiving chemo and has been having emesis since last night. Last chemo dose was 01/28/18. Had platelets and fluids on 1/23.

## 2018-02-07 NOTE — ED Notes (Signed)
Pt attempted to use bedpan, unsuccessful. Per Dr Melina Copa, no in and out foley.

## 2018-02-07 NOTE — ED Notes (Signed)
Dr Melina Copa informed elink questioned about antibiotics for this pt, per Dr Melina Copa awaiting results of chest xray and urine.

## 2018-02-07 NOTE — ED Notes (Signed)
Dr Denton Brick notified pt temp 101.3 15 minutes after transfusion started.

## 2018-02-07 NOTE — ED Notes (Signed)
Date and time results received: 02/07/18 3:19 PM  (use smartphrase ".now" to insert current time)  Test: WBC Critical Value: 0.3  Name of Provider Notified: Melina Copa  Orders Received? Or Actions Taken?: Orders Received - See Orders for details

## 2018-02-07 NOTE — ED Notes (Signed)
Dr Denton Brick paged and informed pt temp 100.6. Do you still want to hang platelets?

## 2018-02-07 NOTE — ED Notes (Signed)
ED TO INPATIENT HANDOFF REPORT  Name/Age/Gender Kristin Good 58 y.o. female  Code Status    Code Status Orders  (From admission, onward)         Start     Ordered   02/07/18 1939  Full code  Continuous     02/07/18 1938        Code Status History    Date Active Date Inactive Code Status Order ID Comments User Context   01/18/2018 1601 01/24/2018 1932 Full Code [id number]  Orson Eva, MD Inpatient      Home/SNF/Other Home  Chief Complaint ca pt emesis  Level of Care/Admitting Diagnosis ED Disposition    ED Disposition Condition [place]: [place] [010272]  Level of Care: Telemetry [5]  Admit to tele based on following criteria: Other see comments  Comments: Hypotension  Diagnosis: AKI (acute kidney injury) [facility]) [536644]  Admitting Physician: Bethena Roys [phone]  Attending Physician: Bethena Roys Nessa.Cuff  PT Class (Do Not Modify): Observation [104]  PT Acc Code (Do Not Modify): Observation [10022]       Medical History Past Medical History:  Diagnosis Date  . Diabetes ([place]) 01/20/2018  . HTN (hypertension)   . Hypothyroidism   . 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 Doxycycline Anaphylaxis  . Penicillins Anaphylaxis    Did it involve swelling of the face/tongue/throat, SOB, or low BP? Yes-SOB Did it involve sudden or severe rash/hives, skin peeling, or any reaction on the inside of your mouth or nose? Unknown Did you need to seek medical attention at a hospital or doctor's office? Yes When did it last happen?Over 10 years ago If all above answers are "NO", may proceed with cephalosporin use.   . Levofloxacin Nausea And Vomiting  . Sulfa Antibiotics Itching and Rash    IV Location/Drains/Wounds Patient Lines/Drains/Airways Status   Active Line/Drains/Airways    Name:   Placement date:   Placement time:   Site:   Days:    Peripheral IV 02/07/18 Left Antecubital   02/07/18    1442    Antecubital   less than 1   PICC Single Lumen 42/59/56 PICC Right Basilic 38 cm 0 cm   38/75/64    3329    Basilic   15   Incision (Closed) 01/21/18 Back Right   01/21/18    0930     17          Labs/Imaging Results for orders placed or performed during the hospital encounter of 02/07/18 (from the past 48 hour(s))  Culture, blood (Routine x 2)     Status: None (Preliminary result)   Collection Time: 02/07/18  2:44 PM  Result Value Ref Range   Specimen Description BLOOD LEFT ARM    Special Requests      BOTTLES DRAWN AEROBIC AND ANAEROBIC Blood Culture adequate volume Performed at [facility], [address] [place], [place] [address]    Culture PENDING    Report Status PENDING   Comprehensive metabolic panel     Status: Abnormal   Collection Time: 02/07/18  2:45 PM  Result Value Ref Range   Sodium 131 (L) 135 - 145 mmol/L   Potassium 3.9 3.5 - 5.1 mmol/L   Chloride 92 (L) 98 - 111 mmol/L   CO2 24 22 - 32 mmol/L   Glucose, Bld 340 (H) 70 - 99 mg/dL  BUN 53 (H) 6 - 20 mg/dL   Creatinine, Ser 1.74 (H) 0.44 - 1.00 mg/dL   Calcium 9.4 8.9 - 10.3 mg/dL   Total Protein 6.5 6.5 - 8.1 g/dL   Albumin 3.5 3.5 - 5.0 g/dL   AST 11 (L) 15 - 41 U/L   ALT 17 0 - 44 U/L   Alkaline Phosphatase 124 38 - 126 U/L   Total Bilirubin 1.3 (H) 0.3 - 1.2 mg/dL   GFR calc non Af Amer 32 (L) >60 mL/min   GFR calc Af Amer 37 (L) >60 mL/min   Anion gap 15 5 - 15    Comment: Performed at [facility], [address], [place], [place] [address]  Lactic acid, plasma     Status: Abnormal   Collection Time: 02/07/18  2:45 PM  Result Value Ref Range   Lactic Acid, Venous 3.0 (HH) 0.5 - 1.9 mmol/L    Comment: CRITICAL RESULT CALLED TO, READ BACK BY AND VERIFIED WITH: Irania Durell H. @ 2694 ON [id number] BY HENDERSON L. Performed at [facility], [address], [place], [place] [address]   CBC with Differential     Status: Abnormal   Collection  Time: 02/07/18  2:45 PM  Result Value Ref Range   WBC 0.3 (LL) 4.0 - 10.5 K/uL    Comment: REPEATED TO VERIFY WHITE COUNT CONFIRMED ON SMEAR THIS CRITICAL RESULT HAS VERIFIED AND BEEN CALLED TO CREWS,M BY BOBBIE MATTHEWS ON 01 25 2020 AT 1517, AND HAS BEEN READ BACK. REPEATED TO VERIFY    RBC 3.43 (L) 3.87 - 5.11 MIL/uL   Hemoglobin 9.9 (L) 12.0 - 15.0 g/dL   HCT 29.8 (L) 36.0 - 46.0 %   MCV 86.9 80.0 - 100.0 fL   MCH 28.9 26.0 - 34.0 pg   MCHC 33.2 30.0 - 36.0 g/dL   RDW 14.7 11.5 - 15.5 %   Platelets 8 (LL) 150 - 400 K/uL    Comment: PLATELET COUNT CONFIRMED BY SMEAR SPECIMEN CHECKED FOR CLOTS Immature Platelet Fraction may be clinically indicated, consider ordering this additional test [id number] THIS CRITICAL RESULT HAS VERIFIED AND BEEN CALLED TO CREWS,M BY BOBBIE MATTHEWS ON 01 25 2020 AT 1517, AND HAS BEEN READ BACK. REPEATED TO VERIFY    nRBC 0.0 0.0 - 0.2 %   Neutrophils Relative % 4 %   Neutro Abs 0.0 (L) 1.7 - 7.7 K/uL   Lymphocytes Relative 92 %   Lymphs Abs 0.2 (L) 0.7 - 4.0 K/uL   Monocytes Relative 4 %   Monocytes Absolute 0.0 (L) 0.1 - 1.0 K/uL   Eosinophils Relative 0 %   Eosinophils Absolute 0.0 0.0 - 0.5 K/uL   Basophils Relative 0 %   Basophils Absolute 0.0 0.0 - 0.1 K/uL   Immature Granulocytes 0 %   Abs Immature Granulocytes 0.00 0.00 - 0.07 K/uL   Schistocytes PRESENT    Tear Drop Cells PRESENT     Comment: Performed at [facility], [address], [place], [place] [address]  Protime-INR     Status: None   Collection Time: 02/07/18  2:45 PM  Result Value Ref Range   Prothrombin Time 14.5 11.4 - 15.2 seconds   INR 1.14     Comment: Performed at [facility], [address] [place], [place] [address]  Culture, blood (Routine x 2)     Status: None (Preliminary result)   Collection Time: 02/07/18  2:54 PM  Result Value Ref Range   Specimen Description BLOOD LEFT ARM  Special Requests      BOTTLES DRAWN AEROBIC AND ANAEROBIC Blood Culture adequate  volume Performed at [facility], [address], [place], [place] [address]    Culture PENDING    Report Status PENDING   Lipase, blood     Status: None   Collection Time: 02/07/18  2:55 PM  Result Value Ref Range   Lipase 19 11 - 51 U/L    Comment: Performed at [facility], [address] [place], [place] [address]  Troponin I - ONCE - STAT     Status: None   Collection Time: 02/07/18  2:55 PM  Result Value Ref Range   Troponin I <0.03 <0.03 ng/mL    Comment: Performed at [facility], [address], [place], [place] [address]  Lactic acid, plasma     Status: None   Collection Time: 02/07/18  4:16 PM  Result Value Ref Range   Lactic Acid, Venous 1.4 0.5 - 1.9 mmol/L    Comment: Performed at [facility], [address] [place], [place] [address]  Prepare Pheresed Platelets     Status: None (Preliminary result)   Collection Time: 02/07/18  4:16 PM  Result Value Ref Range   Unit Number [id number]    Blood Component Type PLTPHER LI2    Unit division 00    Status of Unit ISSUED    Transfusion Status      OK TO TRANSFUSE Performed at [facility], [address], [place], [place] [address]   Type and screen [facility]     Status: None   Collection Time: 02/07/18  4:26 PM  Result Value Ref Range   ABO/RH(D) B POS    Antibody Screen NEG    Sample Expiration      02/10/2018 Performed at [facility], [address] [place], [place] [address]   Magnesium     Status: None   Collection Time: 02/07/18  4:26 PM  Result Value Ref Range   Magnesium 1.8 1.7 - 2.4 mg/dL    Comment: Performed at [facility], [address] [place], [place] [address]   Ct Abdomen Pelvis Wo Contrast  Result Date: 02/07/2018 CLINICAL DATA:  History of T-cell lymphoma with recent vomiting EXAM: CT ABDOMEN AND PELVIS WITHOUT CONTRAST TECHNIQUE: Multidetector CT imaging of the abdomen and pelvis was performed following the standard protocol without IV contrast. COMPARISON:  PET-CT from  02/02/2018, CT from 01/20/2018 FINDINGS: Lower chest: No acute abnormality. Hepatobiliary: Liver is within normal limits. The gallbladder is decompressed. Previously seen wall thickening in the gallbladder on CT has resolved in the interval. Small hypodensity is noted in the lateral segment of the left lobe of the liver consistent with a tiny cyst. Pancreas: Unremarkable. No pancreatic ductal dilatation or surrounding inflammatory changes. Spleen: Spleen is enlarged similar to that seen on prior PET-CT although no focal mass is noted. Adrenals/Urinary Tract: Adrenal glands are within normal limits. Kidneys are well visualized bilaterally. Nonobstructing renal calculi are seen bilaterally. The collecting systems and ureters are within normal limits. The bladder is well distended. Stable densities are noted at the base of the bladder stable from previous exams. Stomach/Bowel: Multiple dilated loops of jejunum are identified increased when compared with the prior PET-CT from 4 days previous. Fecalization of small bowel contents is noted within the proximal ileum. Transition zone is noted deep within the pelvis centrally although no focal mass lesion is noted. These changes are likely related to underlying adhesions. The more distal ileum is within  normal limits. The colon shows fecal material within. The appendix is within normal limits. Vascular/Lymphatic: Aortic atherosclerosis. No enlarged abdominal or pelvic lymph nodes. Reproductive: Uterus and bilateral adnexa are unremarkable. Other: No abdominal wall hernia or abnormality. No abdominopelvic ascites. Musculoskeletal: Degenerative changes of lumbar spine are noted. No acute bony abnormality is seen. IMPRESSION: New proximal small bowel obstruction extending from the proximal jejunum into the proximal ileum. A transition zone is noted deep in the pelvis although incompletely evaluated without oral or IV contrast. This is likely related to adhesions given a  relatively negative PET-CT from 4 days previous. The remainder of the exam is stable from the recent exams. Electronically Signed   By: Inez Catalina M.D.   On: 02/07/2018 19:19   Dg Chest Port 1 View  Result Date: 02/07/2018 CLINICAL DATA:  Emesis, cough, history of lymphoma EXAM: PORTABLE CHEST 1 VIEW COMPARISON:  02/02/2018 FINDINGS: Right-sided PICC line is noted in satisfactory position. Cardiac shadow is within normal limits. The lungs are well aerated without focal infiltrate. No bony abnormality is seen. IMPRESSION: No acute abnormality noted. Electronically Signed   By: Inez Catalina M.D.   On: 02/07/2018 15:58    Pending Labs Unresulted Labs (From admission, onward)    Start     Ordered   02/08/18 1740  Basic metabolic panel  Tomorrow morning,   R     02/07/18 1938   02/08/18 0500  CBC  Tomorrow morning,   R     02/07/18 1938   02/07/18 2005  Prepare Pheresed Platelets  (Adult Blood Administration - Platelets (Pheresed))  Once,   R    Question Answer Comment  Number of Apheresis Units 1 unit (6-10 packs)   Transfusion Indications PLT Count </=10,000/mm   If emergent release call blood bank Not emergent release      02/07/18 2005   02/07/18 1419  Urinalysis, Routine w reflex microscopic  ONCE - STAT,   STAT     02/07/18 1419          Vitals/Pain Today's Vitals   02/07/18 1930 02/07/18 1939 02/07/18 1944 02/07/18 1945  BP: 102/64   (!) 99/59  Pulse: (!) 112   (!) 113  Resp: 19   20  Temp:  (!) 101.3 F (38.5 C) (!) 102 F (38.9 C)   TempSrc:  Oral Oral   SpO2: 97%   97%  Weight:      Height:      PainSc:        Isolation Precautions Protective Precautions  Medications Medications  meropenem (MERREM) 500 mg in sodium chloride 0.9 % 100 mL IVPB (0 mg Intravenous Stopped 02/07/18 1802)  fluconazole (DIFLUCAN) tablet 100 mg (has no administration in time range)  levothyroxine (SYNTHROID, LEVOTHROID) tablet 75 mcg (has no administration in time range)  valACYclovir  (VALTREX) tablet 500 mg (has no administration in time range)  vitamin B-12 (CYANOCOBALAMIN) tablet 1,000 mcg (has no administration in time range)  vitamin C (ASCORBIC ACID) tablet 500 mg (has no administration in time range)  0.9 %  sodium chloride infusion (has no administration in time range)  acetaminophen (TYLENOL) tablet 650 mg (has no administration in time range)    Or  acetaminophen (TYLENOL) suppository 650 mg (has no administration in time range)  ondansetron (ZOFRAN) tablet 4 mg (has no administration in time range)    Or  ondansetron (ZOFRAN) injection 4 mg (has no administration in time range)  polyethylene glycol (MIRALAX / GLYCOLAX) packet 17  g (has no administration in time range)  insulin aspart (novoLOG) injection 0-9 Units (has no administration in time range)  0.9 %  sodium chloride infusion (Manually program via Guardrails IV Fluids) (has no administration in time range)  sodium chloride flush (NS) 0.9 % injection 3 mL (3 mLs Intravenous Given 02/07/18 1445)  sodium chloride 0.9 % bolus 1,000 mL (0 mLs Intravenous Stopped 02/07/18 1542)    And  sodium chloride 0.9 % bolus 500 mL (0 mLs Intravenous Stopped 02/07/18 1616)    And  sodium chloride 0.9 % bolus 250 mL (0 mLs Intravenous Stopped 02/07/18 1552)  ondansetron (ZOFRAN) injection 4 mg (4 mg Intravenous Given 02/07/18 1453)  0.9 %  sodium chloride infusion (0 mL/hr Intravenous Stopped 02/07/18 1948)  sodium chloride 0.9 % bolus 1,000 mL (0 mLs Intravenous Stopped 02/07/18 1801)  filgrastim (NEUPOGEN) injection 300 mcg (300 mcg Subcutaneous Given 02/07/18 1817)  acetaminophen (TYLENOL) tablet 1,000 mg (1,000 mg Oral Given 02/07/18 1902)    Mobility walks

## 2018-02-07 NOTE — ED Notes (Signed)
Date and time results received: 02/07/18 3:19 PM   (use smartphrase ".now" to insert current time)  Test: Plt Critical Value: 8  Name of Provider Notified: Melina Copa  Orders Received? Or Actions Taken?: Orders Received - See Orders for details

## 2018-02-08 ENCOUNTER — Inpatient Hospital Stay (HOSPITAL_COMMUNITY): Payer: Commercial Managed Care - PPO

## 2018-02-08 DIAGNOSIS — I959 Hypotension, unspecified: Secondary | ICD-10-CM | POA: Diagnosis present

## 2018-02-08 DIAGNOSIS — D709 Neutropenia, unspecified: Secondary | ICD-10-CM | POA: Diagnosis present

## 2018-02-08 DIAGNOSIS — Z880 Allergy status to penicillin: Secondary | ICD-10-CM | POA: Diagnosis not present

## 2018-02-08 DIAGNOSIS — D61818 Other pancytopenia: Secondary | ICD-10-CM | POA: Diagnosis present

## 2018-02-08 DIAGNOSIS — E039 Hypothyroidism, unspecified: Secondary | ICD-10-CM | POA: Diagnosis present

## 2018-02-08 DIAGNOSIS — Z794 Long term (current) use of insulin: Secondary | ICD-10-CM | POA: Diagnosis not present

## 2018-02-08 DIAGNOSIS — I10 Essential (primary) hypertension: Secondary | ICD-10-CM

## 2018-02-08 DIAGNOSIS — C8447 Peripheral T-cell lymphoma, not classified, spleen: Secondary | ICD-10-CM

## 2018-02-08 DIAGNOSIS — Z79899 Other long term (current) drug therapy: Secondary | ICD-10-CM | POA: Diagnosis not present

## 2018-02-08 DIAGNOSIS — E1165 Type 2 diabetes mellitus with hyperglycemia: Secondary | ICD-10-CM | POA: Diagnosis present

## 2018-02-08 DIAGNOSIS — Z85828 Personal history of other malignant neoplasm of skin: Secondary | ICD-10-CM | POA: Diagnosis not present

## 2018-02-08 DIAGNOSIS — Z882 Allergy status to sulfonamides status: Secondary | ICD-10-CM | POA: Diagnosis not present

## 2018-02-08 DIAGNOSIS — A419 Sepsis, unspecified organism: Principal | ICD-10-CM

## 2018-02-08 DIAGNOSIS — E785 Hyperlipidemia, unspecified: Secondary | ICD-10-CM | POA: Diagnosis not present

## 2018-02-08 DIAGNOSIS — T451X5A Adverse effect of antineoplastic and immunosuppressive drugs, initial encounter: Secondary | ICD-10-CM | POA: Diagnosis present

## 2018-02-08 DIAGNOSIS — Z881 Allergy status to other antibiotic agents status: Secondary | ICD-10-CM | POA: Diagnosis not present

## 2018-02-08 DIAGNOSIS — E440 Moderate protein-calorie malnutrition: Secondary | ICD-10-CM | POA: Diagnosis present

## 2018-02-08 DIAGNOSIS — N179 Acute kidney failure, unspecified: Secondary | ICD-10-CM | POA: Diagnosis present

## 2018-02-08 DIAGNOSIS — E876 Hypokalemia: Secondary | ICD-10-CM | POA: Diagnosis present

## 2018-02-08 DIAGNOSIS — R6520 Severe sepsis without septic shock: Secondary | ICD-10-CM

## 2018-02-08 DIAGNOSIS — R5081 Fever presenting with conditions classified elsewhere: Secondary | ICD-10-CM | POA: Diagnosis present

## 2018-02-08 DIAGNOSIS — K56600 Partial intestinal obstruction, unspecified as to cause: Secondary | ICD-10-CM | POA: Diagnosis present

## 2018-02-08 DIAGNOSIS — E872 Acidosis: Secondary | ICD-10-CM | POA: Diagnosis present

## 2018-02-08 DIAGNOSIS — C8440 Peripheral T-cell lymphoma, not classified, unspecified site: Secondary | ICD-10-CM | POA: Diagnosis present

## 2018-02-08 DIAGNOSIS — Z6820 Body mass index (BMI) 20.0-20.9, adult: Secondary | ICD-10-CM | POA: Diagnosis not present

## 2018-02-08 LAB — MRSA PCR SCREENING: MRSA by PCR: NEGATIVE

## 2018-02-08 LAB — GLUCOSE, CAPILLARY
Glucose-Capillary: 165 mg/dL — ABNORMAL HIGH (ref 70–99)
Glucose-Capillary: 142 mg/dL — ABNORMAL HIGH (ref 70–99)
Glucose-Capillary: 111 mg/dL — ABNORMAL HIGH (ref 70–99)
Glucose-Capillary: 125 mg/dL — ABNORMAL HIGH (ref 70–99)

## 2018-02-08 LAB — PREALBUMIN: PREALBUMIN: 10 mg/dL — AB (ref 18–38)

## 2018-02-08 LAB — ABO/RH: ABO/RH(D): B POS

## 2018-02-08 LAB — URINALYSIS, ROUTINE W REFLEX MICROSCOPIC
BILIRUBIN URINE: NEGATIVE
Bacteria, UA: NONE SEEN
Glucose, UA: NEGATIVE mg/dL
Ketones, ur: NEGATIVE mg/dL
Leukocytes, UA: NEGATIVE
Nitrite: NEGATIVE
Protein, ur: 100 mg/dL — AB
SPECIFIC GRAVITY, URINE: 1.015 (ref 1.005–1.030)
pH: 6 (ref 5.0–8.0)

## 2018-02-08 LAB — COMPREHENSIVE METABOLIC PANEL
ALT: 12 U/L (ref 0–44)
AST: 7 U/L — ABNORMAL LOW (ref 15–41)
Albumin: 2.4 g/dL — ABNORMAL LOW (ref 3.5–5.0)
Alkaline Phosphatase: 87 U/L (ref 38–126)
Anion gap: 6 (ref 5–15)
BUN: 42 mg/dL — ABNORMAL HIGH (ref 6–20)
CO2: 23 mmol/L (ref 22–32)
Calcium: 7.3 mg/dL — ABNORMAL LOW (ref 8.9–10.3)
Chloride: 110 mmol/L (ref 98–111)
Creatinine, Ser: 1.07 mg/dL — ABNORMAL HIGH (ref 0.44–1.00)
GFR calc non Af Amer: 57 mL/min — ABNORMAL LOW (ref >60)
Glucose, Bld: 198 mg/dL — ABNORMAL HIGH (ref 70–99)
Potassium: 2.6 mmol/L — CL (ref 3.5–5.1)
Sodium: 139 mmol/L (ref 135–145)
Total Bilirubin: 0.8 mg/dL (ref 0.3–1.2)
Total Protein: 4.8 g/dL — ABNORMAL LOW (ref 6.5–8.1)

## 2018-02-08 LAB — PREPARE PLATELET PHERESIS: Unit division: 0

## 2018-02-08 LAB — INFLUENZA PANEL BY PCR (TYPE A & B)
Influenza A By PCR: NEGATIVE
Influenza B By PCR: NEGATIVE

## 2018-02-08 LAB — HEMOGLOBIN AND HEMATOCRIT, BLOOD
HCT: 26.7 % — ABNORMAL LOW (ref 36.0–46.0)
Hemoglobin: 9 g/dL — ABNORMAL LOW (ref 12.0–15.0)

## 2018-02-08 LAB — PREPARE RBC (CROSSMATCH)

## 2018-02-08 LAB — BPAM PLATELET PHERESIS
Blood Product Expiration Date: 202001272359
ISSUE DATE / TIME: 202001251841
Unit Type and Rh: 6200

## 2018-02-08 MED ORDER — SODIUM CHLORIDE 0.9 % IV SOLN
1.0000 g | Freq: Two times a day (BID) | INTRAVENOUS | Status: DC
  Administered 2018-02-08 – 2018-02-10 (×4): 1 g via INTRAVENOUS
  Filled 2018-02-08 (×4): qty 1

## 2018-02-08 MED ORDER — ACETAMINOPHEN 650 MG RE SUPP
650.0000 mg | Freq: Once | RECTAL | Status: AC
  Administered 2018-02-08: 650 mg via RECTAL
  Filled 2018-02-08: qty 1

## 2018-02-08 MED ORDER — TBO-FILGRASTIM 300 MCG/0.5ML ~~LOC~~ SOSY
300.0000 ug | PREFILLED_SYRINGE | Freq: Every day | SUBCUTANEOUS | Status: DC
  Administered 2018-02-08: 300 ug via SUBCUTANEOUS
  Filled 2018-02-08: qty 0.5

## 2018-02-08 MED ORDER — POTASSIUM CHLORIDE 10 MEQ/100ML IV SOLN
10.0000 meq | INTRAVENOUS | Status: AC
  Administered 2018-02-08 (×2): 10 meq via INTRAVENOUS
  Filled 2018-02-08 (×3): qty 100

## 2018-02-08 MED ORDER — SODIUM CHLORIDE 0.9% IV SOLUTION
Freq: Once | INTRAVENOUS | Status: AC
  Administered 2018-02-08: 05:00:00 via INTRAVENOUS

## 2018-02-08 MED ORDER — CHLORHEXIDINE GLUCONATE CLOTH 2 % EX PADS
6.0000 | MEDICATED_PAD | Freq: Every day | CUTANEOUS | Status: DC
  Administered 2018-02-08 – 2018-02-10 (×3): 6 via TOPICAL

## 2018-02-08 MED ORDER — DIATRIZOATE MEGLUMINE & SODIUM 66-10 % PO SOLN
90.0000 mL | Freq: Once | ORAL | Status: AC
  Administered 2018-02-08: 90 mL via NASOGASTRIC
  Filled 2018-02-08: qty 90

## 2018-02-08 MED ORDER — DIPHENHYDRAMINE HCL 50 MG/ML IJ SOLN
25.0000 mg | Freq: Once | INTRAMUSCULAR | Status: AC
  Administered 2018-02-08: 25 mg via INTRAVENOUS
  Filled 2018-02-08: qty 1

## 2018-02-08 MED ORDER — SODIUM CHLORIDE 0.9% FLUSH: 10.0000 mL | INTRAVENOUS | Status: DC | PRN

## 2018-02-08 MED ORDER — SODIUM CHLORIDE 0.9% IV SOLUTION
Freq: Once | INTRAVENOUS | Status: AC
  Administered 2018-02-08: 12:00:00 via INTRAVENOUS

## 2018-02-08 MED ORDER — POTASSIUM CHLORIDE 10 MEQ/100ML IV SOLN
10.0000 meq | INTRAVENOUS | Status: AC
  Administered 2018-02-08 (×2): 10 meq via INTRAVENOUS
  Filled 2018-02-08: qty 100

## 2018-02-08 NOTE — Progress Notes (Signed)
Patient transfer from [facility] to room 1419. Upon arrival, Pt mews score was red, after VS checked, Pt Mews score change to yellow. MD aware. Patient alert and oriented. Order for I&O given but Pt was able to void, UA, and culture obtained and send to lab.  Pt  swap for flu, awaiting result.

## 2018-02-08 NOTE — Consult Note (Addendum)
Reason for Consult: SBO Referring Physician: Denton Brick, MD  Collene Gobble is an 58 y.o. female.  HPI:  Patient is a 58 year old female with a recent diagnosis of atypical T-cell lymphoma who presented to [facility] with 2 to 3 days of nausea, vomiting, and crampy abdominal pain.  She originally thought that this was related to the Neulasta, but called oncology and they stated that they can cause muscle cramps, but does not typically cause abdominal symptoms.  When she continued to be unable to keep any food down and continued to throw up, she came to the hospital.  She was found to be in acute kidney injury with a creatinine of 1.7.  This is around twice her baseline creatinine.  A CT scan showed dilated small bowel loops with a transition point and concern for small bowel obstruction.  She has had some loose stools for several days.  She does not recall when she passed gas.  The patient also had fever.  Because of this, she was ruled out for UTI and for influenza.  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  Of note, she is neutropenic and upon evaluation in the emergency department had a platelet count of 8000.  She has received pheresis platelets overnight.  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    Past Medical History:  Diagnosis Date  . Diabetes ([place]) 01/20/2018  . HTN (hypertension)   . Hypothyroidism   . Leukemia ([place])    Atypical T cell lymphoma    Past Surgical History:  Procedure Laterality Date  . COLONOSCOPY  07/27/2010   Procedure: COLONOSCOPY;  Surgeon: Dorothyann Peng, MD;  Location: [facility];  Service: Endoscopy;  Laterality: N/A;  . infertility surgery    . MOHS SURGERY     basal cell carcinoma  . REDUCTION MAMMAPLASTY Bilateral 2002    Family History  Problem Relation Age of Onset  . Heart disease Mother   . Diabetes Father   . Breast cancer Sister 55  . Breast cancer Maternal Aunt   . Colon cancer Neg Hx   . Liver  disease Neg Hx     Social History:  reports that she has never smoked. She has never used smokeless tobacco. She reports current alcohol use. She reports that she does not use drugs.  Allergies:  Allergies  Allergen Reactions  . Doxycycline Anaphylaxis  . Penicillins Anaphylaxis    Did it involve swelling of the face/tongue/throat, SOB, or low BP? Yes-SOB Did it involve sudden or severe rash/hives, skin peeling, or any reaction on the inside of your mouth or nose? Unknown Did you need to seek medical attention at a hospital or doctor's office? Yes When did it last happen?Over 10 years ago If all above answers are "NO", may proceed with cephalosporin use.   . Levofloxacin Nausea And Vomiting  . Sulfa Antibiotics Itching and Rash    Medications:  Current Meds  Medication Sig  . atorvastatin (LIPITOR) 20 MG tablet Take 1 tablet by mouth every morning.   . calcium-vitamin D (CALCIUM 500+D HIGH POTENCY) 500-400 MG-UNIT tablet Take 1 tablet by mouth 2 (two) times daily.  . ciprofloxacin (CIPRO) 500 MG tablet Take 1 tablet (500 mg total) by mouth 2 (two) times daily.  . cyclophosphamide in sodium chloride 0.9 % 250 mL Inject 1,260 mg into the vein every 21 ( twenty-one) days.  Marland Kitchen DOXOrubicin HCl (ADRIAMYCIN IV) Inject 84 mg into  the vein every 21 ( twenty-one) days.  . ETOPOSIDE IV Inject 170 mg into the vein every 21 ( twenty-one) days. Days 1-3  . fluconazole (DIFLUCAN) 100 MG tablet Take 1 tablet (100 mg total) by mouth daily.  . Glucosamine-Chondroit-Vit C-Mn (GLUCOSAMINE 1500 COMPLEX PO) Take 1 capsule by mouth 2 (two) times daily.  Marland Kitchen levothyroxine (SYNTHROID, LEVOTHROID) 75 MCG tablet Take 75 mcg by mouth daily before breakfast.   . metFORMIN (GLUCOPHAGE) 1000 MG tablet Take 1 tablet by mouth 2 (two) times daily.  . pegfilgrastim (NEULASTA) 6 MG/0.6ML injection Inject 6 mg into the skin See admin instructions. To be administered 3 days after chemo therapy treatment  .  prochlorperazine (COMPAZINE) 10 MG tablet Take 1 tablet (10 mg total) by mouth every 6 (six) hours as needed for nausea or vomiting.  . triamterene-hydrochlorothiazide (MAXZIDE-25) 37.5-25 MG per tablet Take 1 tablet by mouth daily.    . valACYclovir (VALTREX) 500 MG tablet Take 1 tablet (500 mg total) by mouth daily.  . vinCRIStine 2 mg in sodium chloride 0.9 % 50 mL Inject 2 mg into the vein every 21 ( twenty-one) days.  . vitamin B-12 (CYANOCOBALAMIN) 1000 MCG tablet Take 1 tablet (1,000 mcg total) by mouth daily.  . vitamin C (ASCORBIC ACID) 500 MG tablet Take 500 mg by mouth 2 (two) times daily.     Results for orders placed or performed during the hospital encounter of 02/07/18 (from the past 48 hour(s))  Culture, blood (Routine x 2)     Status: None (Preliminary result)   Collection Time: 02/07/18  2:44 PM  Result Value Ref Range   Specimen Description BLOOD LEFT ARM    Special Requests      BOTTLES DRAWN AEROBIC AND ANAEROBIC Blood Culture adequate volume   Culture      NO GROWTH < 24 HOURS Performed at [facility], [address], [place], [place] [address]    Report Status PENDING   Comprehensive metabolic panel     Status: Abnormal   Collection Time: 02/07/18  2:45 PM  Result Value Ref Range   Sodium 131 (L) 135 - 145 mmol/L   Potassium 3.9 3.5 - 5.1 mmol/L   Chloride 92 (L) 98 - 111 mmol/L   CO2 24 22 - 32 mmol/L   Glucose, Bld 340 (H) 70 - 99 mg/dL   BUN 53 (H) 6 - 20 mg/dL   Creatinine, Ser 1.74 (H) 0.44 - 1.00 mg/dL   Calcium 9.4 8.9 - 10.3 mg/dL   Total Protein 6.5 6.5 - 8.1 g/dL   Albumin 3.5 3.5 - 5.0 g/dL   AST 11 (L) 15 - 41 U/L   ALT 17 0 - 44 U/L   Alkaline Phosphatase 124 38 - 126 U/L   Total Bilirubin 1.3 (H) 0.3 - 1.2 mg/dL   GFR calc non Af Amer 32 (L) >60 mL/min   GFR calc Af Amer 37 (L) >60 mL/min   Anion gap 15 5 - 15    Comment: Performed at [facility], [address], [place], [place] [address]  Lactic acid, plasma     Status: Abnormal    Collection Time: 02/07/18  2:45 PM  Result Value Ref Range   Lactic Acid, Venous 3.0 (HH) 0.5 - 1.9 mmol/L    Comment: CRITICAL RESULT CALLED TO, READ BACK BY AND VERIFIED WITH: CRAWFORD H. @ 6144 ON [id number] BY HENDERSON L. Performed at [facility], [address], [place], [place] [address]   CBC with  Differential     Status: Abnormal   Collection Time: 02/07/18  2:45 PM  Result Value Ref Range   WBC 0.3 (LL) 4.0 - 10.5 K/uL    Comment: REPEATED TO VERIFY WHITE COUNT CONFIRMED ON SMEAR THIS CRITICAL RESULT HAS VERIFIED AND BEEN CALLED TO CREWS,M BY BOBBIE MATTHEWS ON 01 25 2020 AT 1517, AND HAS BEEN READ BACK. REPEATED TO VERIFY    RBC 3.43 (L) 3.87 - 5.11 MIL/uL   Hemoglobin 9.9 (L) 12.0 - 15.0 g/dL   HCT 29.8 (L) 36.0 - 46.0 %   MCV 86.9 80.0 - 100.0 fL   MCH 28.9 26.0 - 34.0 pg   MCHC 33.2 30.0 - 36.0 g/dL   RDW 14.7 11.5 - 15.5 %   Platelets 8 (LL) 150 - 400 K/uL    Comment: PLATELET COUNT CONFIRMED BY SMEAR SPECIMEN CHECKED FOR CLOTS Immature Platelet Fraction may be clinically indicated, consider ordering this additional test [id number] THIS CRITICAL RESULT HAS VERIFIED AND BEEN CALLED TO CREWS,M BY BOBBIE MATTHEWS ON 01 25 2020 AT 1517, AND HAS BEEN READ BACK. REPEATED TO VERIFY    nRBC 0.0 0.0 - 0.2 %   Neutrophils Relative % 4 %   Neutro Abs 0.0 (L) 1.7 - 7.7 K/uL   Lymphocytes Relative 92 %   Lymphs Abs 0.2 (L) 0.7 - 4.0 K/uL   Monocytes Relative 4 %   Monocytes Absolute 0.0 (L) 0.1 - 1.0 K/uL   Eosinophils Relative 0 %   Eosinophils Absolute 0.0 0.0 - 0.5 K/uL   Basophils Relative 0 %   Basophils Absolute 0.0 0.0 - 0.1 K/uL   Immature Granulocytes 0 %   Abs Immature Granulocytes 0.00 0.00 - 0.07 K/uL   Schistocytes PRESENT    Tear Drop Cells PRESENT     Comment: Performed at [facility], [address], [place], [place] [address]  Protime-INR     Status: None   Collection Time: 02/07/18  2:45 PM  Result Value Ref Range   Prothrombin Time 14.5 11.4 - 15.2  seconds   INR 1.14     Comment: Performed at [facility], [address], [place], [place] [address]  Culture, blood (Routine x 2)     Status: None (Preliminary result)   Collection Time: 02/07/18  2:54 PM  Result Value Ref Range   Specimen Description BLOOD LEFT ARM    Special Requests      BOTTLES DRAWN AEROBIC AND ANAEROBIC Blood Culture adequate volume   Culture      NO GROWTH < 24 HOURS Performed at [facility], [address] [place], [place] [address]    Report Status PENDING   Lipase, blood     Status: None   Collection Time: 02/07/18  2:55 PM  Result Value Ref Range   Lipase 19 11 - 51 U/L    Comment: Performed at [facility], [address] [place], [place] [address]  Troponin I - ONCE - STAT     Status: None   Collection Time: 02/07/18  2:55 PM  Result Value Ref Range   Troponin I <0.03 <0.03 ng/mL    Comment: Performed at [facility], [address] [place], [place] [address]  Lactic acid, plasma     Status: None   Collection Time: 02/07/18  4:16 PM  Result Value Ref Range   Lactic Acid, Venous 1.4 0.5 - 1.9 mmol/L    Comment: Performed at [facility], [address], [place],  [address]  Prepare Pheresed  Platelets     Status: None (Preliminary result)   Collection Time: 02/07/18  4:16 PM  Result Value Ref Range   Unit Number [id number]    Blood Component Type PLTPHER LI2    Unit division 00    Status of Unit ISSUED    Transfusion Status      OK TO TRANSFUSE Performed at [facility], [address], [place], [place] [address]   Type and screen [facility]     Status: None   Collection Time: 02/07/18  4:26 PM  Result Value Ref Range   ABO/RH(D) B POS    Antibody Screen NEG    Sample Expiration      02/10/2018 Performed at [facility], [address], [place], [place] [address]   Magnesium     Status: None   Collection Time: 02/07/18  4:26 PM  Result Value Ref Range   Magnesium 1.8 1.7 - 2.4 mg/dL    Comment: Performed at [facility], [address], [place], [place] [address]  CBG monitoring, ED     Status: Abnormal   Collection Time: 02/07/18  8:37 PM  Result Value Ref Range   Glucose-Capillary 217 (H) 70 - 99 mg/dL  Glucose, capillary     Status: Abnormal   Collection Time: 02/07/18 10:03 PM  Result Value Ref Range   Glucose-Capillary 221 (H) 70 - 99 mg/dL   Comment 1 Notify RN    Comment 2 Document in Chart   Type and screen [place]     Status: None   Collection Time: 02/07/18 10:25 PM  Result Value Ref Range   ABO/RH(D) B POS    Antibody Screen NEG    Sample Expiration      02/10/2018 Performed at [facility], [place] [address], [place], [place] [address]   Urinalysis, Routine w reflex microscopic     Status: Abnormal   Collection Time: 02/07/18 11:45 PM  Result Value Ref Range   Color, Urine YELLOW YELLOW   APPearance HAZY (A) CLEAR   Specific Gravity, Urine 1.015 1.005 - 1.030   pH 6.0 5.0 - 8.0   Glucose, UA NEGATIVE NEGATIVE mg/dL   Hgb urine dipstick SMALL (A) NEGATIVE   Bilirubin Urine NEGATIVE NEGATIVE   Ketones, ur NEGATIVE NEGATIVE mg/dL   Protein, ur 100 (A) NEGATIVE mg/dL   Nitrite NEGATIVE NEGATIVE   Leukocytes, UA NEGATIVE NEGATIVE   RBC / HPF 0-5 0 - 5 RBC/hpf   WBC, UA 0-5 0 - 5 WBC/hpf   Bacteria, UA NONE SEEN NONE SEEN   Squamous Epithelial / LPF 0-5 0 - 5   Mucus PRESENT    Granular Casts, UA PRESENT     Comment: Performed at [facility], [place] [address], [place], [place] [address]  Influenza panel by PCR (type A & B)     Status: None   Collection Time: 02/07/18 11:45 PM  Result Value Ref Range   Influenza A By PCR NEGATIVE NEGATIVE   Influenza B By PCR NEGATIVE NEGATIVE    Comment: (NOTE) The Xpert Xpress Flu assay is intended as an aid in the diagnosis of  influenza and should not be used as a sole basis for treatment.  This  assay is FDA approved for nasopharyngeal swab specimens only. Nasal  washings and  aspirates are unacceptable for Xpert Xpress Flu testing. Performed at [facility], [place] [address], [place], [place] [address]   Prepare Pheresed Platelets  Status: None (Preliminary result)   Collection Time: 02/08/18  3:21 AM  Result Value Ref Range   Unit Number [id number]    Blood Component Type PLTP LR1 PAS    Unit division 00    Status of Unit ISSUED    Transfusion Status      OK TO TRANSFUSE Performed at [facility] [address], [place], [place] [address]     Ct Abdomen Pelvis Wo Contrast  Result Date: 02/07/2018 CLINICAL DATA:  History of T-cell lymphoma with recent vomiting EXAM: CT ABDOMEN AND PELVIS WITHOUT CONTRAST TECHNIQUE: Multidetector CT imaging of the abdomen and pelvis was performed following the standard protocol without IV contrast. COMPARISON:  PET-CT from 02/02/2018, CT from 01/20/2018 FINDINGS: Lower chest: No acute abnormality. Hepatobiliary: Liver is within normal limits. The gallbladder is decompressed. Previously seen wall thickening in the gallbladder on CT has resolved in the interval. Small hypodensity is noted in the lateral segment of the left lobe of the liver consistent with a tiny cyst. Pancreas: Unremarkable. No pancreatic ductal dilatation or surrounding inflammatory changes. Spleen: Spleen is enlarged similar to that seen on prior PET-CT although no focal mass is noted. Adrenals/Urinary Tract: Adrenal glands are within normal limits. Kidneys are well visualized bilaterally. Nonobstructing renal calculi are seen bilaterally. The collecting systems and ureters are within normal limits. The bladder is well distended. Stable densities are noted at the base of the bladder stable from previous exams. Stomach/Bowel: Multiple dilated loops of jejunum are identified increased when compared with the prior PET-CT from 4 days previous. Fecalization of small bowel contents is noted within the proximal ileum. Transition zone  is noted deep within the pelvis centrally although no focal mass lesion is noted. These changes are likely related to underlying adhesions. The more distal ileum is within normal limits. The colon shows fecal material within. The appendix is within normal limits. Vascular/Lymphatic: Aortic atherosclerosis. No enlarged abdominal or pelvic lymph nodes. Reproductive: Uterus and bilateral adnexa are unremarkable. Other: No abdominal wall hernia or abnormality. No abdominopelvic ascites. Musculoskeletal: Degenerative changes of lumbar spine are noted. No acute bony abnormality is seen. IMPRESSION: New proximal small bowel obstruction extending from the proximal jejunum into the proximal ileum. A transition zone is noted deep in the pelvis although incompletely evaluated without oral or IV contrast. This is likely related to adhesions given a relatively negative PET-CT from 4 days previous. The remainder of the exam is stable from the recent exams. Electronically Signed   By: Inez Catalina M.D.   On: 02/07/2018 19:19   Dg Chest Port 1 View  Result Date: 02/07/2018 CLINICAL DATA:  Emesis, cough, history of lymphoma EXAM: PORTABLE CHEST 1 VIEW COMPARISON:  02/02/2018 FINDINGS: Right-sided PICC line is noted in satisfactory position. Cardiac shadow is within normal limits. The lungs are well aerated without focal infiltrate. No bony abnormality is seen. IMPRESSION: No acute abnormality noted. Electronically Signed   By: Inez Catalina M.D.   On: 02/07/2018 15:58    Review of Systems  Constitutional: Positive for fever, malaise/fatigue and weight loss.  HENT: Negative.   Eyes: Negative.   Respiratory: Negative.   Cardiovascular: Negative.   Gastrointestinal: Positive for abdominal pain, diarrhea, nausea and vomiting. Negative for blood in stool.  Genitourinary: Negative.   Musculoskeletal: Negative.   Skin: Negative.   Neurological: Negative.   Endo/Heme/Allergies: Bruises/bleeds easily (chemo,  thrombocytopenia).  Psychiatric/Behavioral: Negative.   All other systems reviewed and are negative.  Blood pressure (!) 111/59, pulse 94,  temperature 98.4 F (36.9 C), temperature source Oral, resp. rate 20, height 5\' 3"  (1.6 m), weight 52.2 kg, SpO2 100 %. Physical Exam  Constitutional: She is oriented to person, place, and time. She appears well-developed. She appears distressed.  Looks a bit cachectic  HENT:  Head: Normocephalic and atraumatic.  Right Ear: External ear normal.  Left Ear: External ear normal.  Nose: Nose normal.  Eyes: Pupils are equal, round, and reactive to light. Conjunctivae and EOM are normal. Right eye exhibits no discharge. Left eye exhibits no discharge. No scleral icterus.  Neck: Normal range of motion. Neck supple. No tracheal deviation present. No thyromegaly present.  Cardiovascular: Normal rate, regular rhythm and intact distal pulses.  Respiratory: Effort normal. No respiratory distress. She exhibits no tenderness.  GI: Soft. She exhibits distension (mild). She exhibits no mass. There is no abdominal tenderness. There is no rebound and no guarding.  Musculoskeletal:        General: No tenderness, deformity or edema.  Lymphadenopathy:    She has no cervical adenopathy.  Neurological: She is alert and oriented to person, place, and time. No cranial nerve deficit. Coordination normal.  Skin: Skin is warm and dry. No rash noted. She is not diaphoretic. No erythema. There is pallor.  Psychiatric: She has a normal mood and affect. Her behavior is normal. Judgment and thought content normal.    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   Diabetes with hyperglycemia  Will start small bowel protocol since she has gotten platelets. She is at risk of nosebleed with NGT placement, but  small bowel is quite dilated.    This is most likely due to adhesions.  Discussed role of NGT and likelihood that this will resolve.    It is possible that this is related to neutropenia and infection, however.  If so, that should be self limited.  Will need hydration and supportive care for hypotension.  Also, pt receiving tighter blood glucose control.    Given neutropenia, pt would have significant difficulty if surgery is required.  Her wound healing would be poor and if a small bowel resection was needed, her risk of anastamotic leak is much higher than a baseline patient.    We would likely wait longer for surgery.  Given weight loss, will check pre albumin.  If extremely low and she is not progressing, would be reasonable to start TNA.      Stark Klein 02/08/2018, 7:12 AM

## 2018-02-08 NOTE — Progress Notes (Signed)
Pharmacy Antibiotic Note  Kristin Good is a 58 y.o. female admitted on 02/07/2018 with febrile neutropenia.  She was taking fluconazole, cipro, and valtrex PTA.  Pharmacy has been consulted for Meropenem dosing.  No hx ESBL noted PCN allergy = anaphylaxis, no hx of cephalosporins given in [facility].  Tm 102, WBC 0.1, ANC 0 Lactic acid: 3 >> 1.4 SCr improved to 1.07, CrCl ~ 46 ml/min  Plan: Meropenem 1g IV q12h Follow up renal fxn, culture results, and clinical course. F/u ability to de-escalate antibiotics.   Height: 5\' 3"  (160 cm) Weight: 115 lb (52.2 kg) IBW/kg (Calculated) : 52.4  Temp (24hrs), Avg:99.4 F (37.4 C), Min:97.6 F (36.4 C), Max:102 F (38.9 C)  Recent Labs  Lab 02/02/18 1507 02/05/18 0914 02/07/18 1445 02/07/18 1616 02/08/18 0728  WBC 1.2* 0.4* 0.3*  --  0.1*  CREATININE 1.09* 1.07* 1.74*  --  1.07*  LATICACIDVEN  --   --  3.0* 1.4  --     Estimated Creatinine Clearance: 46.7 mL/min (A) (by C-G formula based on SCr of 1.07 mg/dL (H)).    Allergies  Allergen Reactions  . Doxycycline Anaphylaxis  . Penicillins Anaphylaxis    Did it involve swelling of the face/tongue/throat, SOB, or low BP? Yes-SOB Did it involve sudden or severe rash/hives, skin peeling, or any reaction on the inside of your mouth or nose? Unknown Did you need to seek medical attention at a hospital or doctor's office? Yes When did it last happen?Over 10 years ago If all above answers are "NO", may proceed with cephalosporin use.   . Levofloxacin Nausea And Vomiting  . Sulfa Antibiotics Itching and Rash    Antimicrobials this admission: 1/25 meropenem >> 1/25 fluconazole >>  Dose adjustments this admission: 1/26 Meropenem dose renally adjusted.  Microbiology results: 1/25 BCx: ngtd 1/25 UCx:  1/25 Influenza A/B: neg/neg  Thank you for allowing pharmacy to be a part of this patient's care.  Gretta Arab PharmD, BCPS Pager [phone] 02/08/2018 11:07 AM

## 2018-02-08 NOTE — Progress Notes (Signed)
CRITICAL VALUE ALERT  Critical Value:  WBC 0.1, Hemoglobin 6.5, Platelets 12, Potassium 2.6  Date & Time Notied: 02/08/2018 0932  Provider Notified: Dr. Cyndia Skeeters   Orders Received/Actions taken: MD aware and new orders received

## 2018-02-08 NOTE — Progress Notes (Signed)
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   In ED, hypotensive, tachycardic, febrile, leukopenic and thrombocytopenic.  Lactic acid 3.0.  Creatinine 1.7 (baseline 0.9).  Chest x-ray without acute finding.  CT abdomen with small bowel obstruction.  Urinalysis not impressive. Per admitting provider, oncology, Dr. Lindi Adie consulted and recommended platelet transfusion, Granix and broad-spectrum antibiotic.  Dr. Lindi Adie did not feel strongly about transfer to [facility] long.  Patient was resuscitated with IV fluid.  Cultures obtained.  Started on broad-spectrum antibiotics and admitted.  Received 1 unit of platelets overnight.  With all that, tachycardia, hypotension and lactic acidosis resolved.  Thrombocytopenia improved.  However, she had worsening leukopenia and anemia.  Dr. Langston Masker consulted formally and suggested 2 units of blood and continue Granix 300 mg daily.  She will see patient today.  In regards to SBO, general surgery consulted and evaluated patient.  Subjective: Hemodynamically stable.  No further emesis since she arrived here.  Reports having loose bowel movement about 4 AM this morning.  Denies hematemesis.  Still with some abdominal pain.  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  Unclear source of infection but  suspect GI source based on her presentation. -Continue meropenem.  could make her pancytopenia worse but history of anaphylaxis to penicillin. -Follow cultures.  Pancytopenia/T-cell lymphoma of the spleen: Followed by Dr. Delton Coombes in [place].  Thrombocytopenia improved after platelet transfusion but worsening leukopenia and anemia. -Appreciate oncology input-blood transfusion, daily Granix.  Formal consult to follow -May need more platelet transfusion given risk of bleeding -Continue monitoring  SBO: Noted on CT abdomen.   -Appreciate general surgery-SBO protocol with NG tube, n.p.o. and IV fluids.  AKI: creatinine peaked at 1.74 and trended down to 1.07.  Baseline about 0.9. -IV fluid as above  Essential hypertension: Hypotensive on presentation.  Currently normotensive. -Hold home antihypertensive medications -IV fluid in the setting of SBO.  Hypothyroidism: -Continue home Synthroid  Prediabetes: Last A1c 5.7 this months.  On metformin at home. -CBG monitoring -Sliding scale insulin  Scheduled Meds: . sodium chloride   Intravenous Once  . Chlorhexidine Gluconate Cloth  6 each Topical Daily  . insulin aspart  0-9 Units Subcutaneous TID WC  . Tbo-filgastrim (GRANIX) SQ  300 mcg Subcutaneous q1800  . vitamin B-12  1,000 mcg Oral Daily  . vitamin C  500 mg Oral BID   Continuous Infusions: . fluconazole (DIFLUCAN) IV 100 mg (02/08/18 1053)  . meropenem (MERREM) IV    . potassium chloride 10 mEq (02/08/18 1056)   PRN Meds:.acetaminophen **OR** acetaminophen, ondansetron **OR** ondansetron (ZOFRAN) IV, polyethylene glycol, sodium chloride flush  DVT prophylaxis: SCD Code Status: Full code Family Communication: None at bedside Disposition Plan: Remains in SDU  Consultants:   Oncology  General surgery  Procedures:   NG tube  Antimicrobials:  Meropenem 1/26--  Objective: Vitals:   02/08/18 0900 02/08/18 1000  02/08/18 1153 02/08/18 1200  BP: 136/66 (!)  126/58 121/68 127/64  Pulse: 94 100 (!) 109 (!) 107  Resp: 17 19 (!) 24 (!) 22  Temp:   98.7 F (37.1 C)   TempSrc:   Oral   SpO2: 100% 100% 100% 97%  Weight:      Height:        Intake/Output Summary (Last 24 hours) at 02/08/2018 1205 Last data filed at 02/08/2018 0700 Gross per 24 hour  Intake 4227.08 ml  Output 650 ml  Net 3577.08 ml   Filed Weights   02/07/18 1416  Weight: 52.2 kg    Examination:  GENERAL: Appears ill HEENT: MMM.  Vision and Hearing grossly intact.  NG tube in place. NECK: Supple.  No JVD.  LUNGS:  No IWOB. Good air movement. CTAB.  HEART:  RRR.  2/6 SEM over RUSB and LUSB. ABD: Bowel sounds present.  Somewhat distended.  Mild tenderness across upper abdomen. MSK/EXT:  no edema bilaterally.  SKIN: no apparent skin lesion.  NEURO: Awake, alert and oriented appropriately.  No gross deficit.  PSYCH: Calm. Normal affect.  Data Reviewed: I have independently reviewed following labs and imaging studies  CBC: Recent Labs  Lab 02/02/18 1507 02/05/18 0914 02/07/18 1445 02/08/18 0728  WBC 1.2* 0.4* 0.3* 0.1*  NEUTROABS 0.2* 0.0* 0.0*  --   HGB 10.4* 9.8* 9.9* 6.5*  HCT 32.0* 29.9* 29.8* 19.8*  MCV 90.7 89.8 86.9 90.4  PLT 39* 15* 8* 12*   Basic Metabolic Panel: Recent Labs  Lab 02/02/18 1507 02/05/18 0914 02/07/18 1445 02/07/18 1626 02/08/18 0728  NA 136 138 131*  --  139  K 3.7 3.9 3.9  --  2.6*  CL 104 104 92*  --  110  CO2 21* 23 24  --  23  GLUCOSE 256* 201* 340*  --  198*  BUN 50* 40* 53*  --  42*  CREATININE 1.09* 1.07* 1.74*  --  1.07*  CALCIUM 8.7* 8.7* 9.4  --  7.3*  MG 2.3  --   --  1.8  --   PHOS 4.7*  --   --   --   --    GFR: Estimated Creatinine Clearance: 46.7 mL/min (A) (by C-G formula based on SCr of 1.07 mg/dL (H)). Liver Function Tests: Recent Labs  Lab 02/02/18 1507 02/05/18 0914 02/07/18 1445 02/08/18 0728  AST 37 16 11* 7*  ALT 58* 30 17 12   ALKPHOS 194* 149* 124 87  BILITOT 0.8 1.3* 1.3* 0.8  PROT 5.3*  5.5* 6.5 4.8*  ALBUMIN 3.4* 3.5 3.5 2.4*   Recent Labs  Lab 02/07/18 1455  LIPASE 19   No results for input(s): AMMONIA in the last 168 hours. Coagulation Profile: Recent Labs  Lab 02/07/18 1445  INR 1.14   Cardiac Enzymes: Recent Labs  Lab 02/07/18 1455  TROPONINI <0.03   BNP (last 3 results) No results for input(s): PROBNP in the last 8760 hours. HbA1C: No results for input(s): HGBA1C in the last 72 hours. CBG: Recent Labs  Lab 02/02/18 1110 02/07/18 2037 02/07/18 2203 02/08/18 0809 02/08/18 1129  GLUCAP 94 217* 221* 165* 142*   Lipid Profile: No results for input(s): CHOL, HDL, LDLCALC, TRIG, CHOLHDL, LDLDIRECT in the last 72 hours. Thyroid Function Tests: No results for input(s): TSH, T4TOTAL, FREET4, T3FREE, THYROIDAB in the last 72 hours. Anemia Panel: No results for input(s): VITAMINB12, FOLATE, FERRITIN, TIBC, IRON, RETICCTPCT in the last 72 hours. Urine analysis:  Component Value Date/Time   COLORURINE YELLOW 02/07/2018 2345   APPEARANCEUR HAZY (A) 02/07/2018 2345   LABSPEC 1.015 02/07/2018 2345   PHURINE 6.0 02/07/2018 2345   GLUCOSEU NEGATIVE 02/07/2018 2345   HGBUR SMALL (A) 02/07/2018 2345   BILIRUBINUR NEGATIVE 02/07/2018 2345   [place] 02/07/2018 2345   PROTEINUR 100 (A) 02/07/2018 2345   NITRITE NEGATIVE 02/07/2018 2345   LEUKOCYTESUR NEGATIVE 02/07/2018 2345   Sepsis Labs: Invalid input(s): PROCALCITONIN, LACTICIDVEN  Recent Results (from the past 240 hour(s))  Culture, blood (Routine x 2)     Status: None (Preliminary result)   Collection Time: 02/07/18  2:44 PM  Result Value Ref Range Status   Specimen Description BLOOD LEFT ARM  Final   Special Requests   Final    BOTTLES DRAWN AEROBIC AND ANAEROBIC Blood Culture adequate volume   Culture   Final    NO GROWTH < 24 HOURS Performed at [facility], [address] [place], [place] [address]    Report Status PENDING  Incomplete  Culture, blood (Routine x 2)      Status: None (Preliminary result)   Collection Time: 02/07/18  2:54 PM  Result Value Ref Range Status   Specimen Description BLOOD LEFT ARM  Final   Special Requests   Final    BOTTLES DRAWN AEROBIC AND ANAEROBIC Blood Culture adequate volume   Culture   Final    NO GROWTH < 24 HOURS Performed at [facility], [address] [place], [place] [address]    Report Status PENDING  Incomplete  Culture, body fluid-bottle     Status: None (Preliminary result)   Collection Time: 02/07/18  4:26 PM  Result Value Ref Range Status   Specimen Description FLUID  Final   Special Requests PLATELET PHERESIS [id number] DIV 00  Final   Culture   Final    NO GROWTH < 12 HOURS Performed at [facility], [address] [place], [place] [address]    Report Status PENDING  Incomplete  Gram stain     Status: None   Collection Time: 02/07/18  4:26 PM  Result Value Ref Range Status   Specimen Description FLUID  Final   Special Requests PLATELET PHERESIS [id number] DIV 00  Final   Gram Stain   Final    NO ORGANISMS SEEN Gram Stain Report Called to,Read Back By and Verified With: DR. PATRICK AT 2231 ON 02/07/2018 BY EVA GRAM STAIN AND CULTURE PERFORMED BY PROCEDURE [id number] Performed at [facility], [address], [place],  [address]    Report Status 02/07/2018 FINAL  Final      Radiology Studies: Ct Abdomen Pelvis Wo Contrast  Result Date: 02/07/2018 CLINICAL DATA:  History of T-cell lymphoma with recent vomiting EXAM: CT ABDOMEN AND PELVIS WITHOUT CONTRAST TECHNIQUE: Multidetector CT imaging of the abdomen and pelvis was performed following the standard protocol without IV contrast. COMPARISON:  PET-CT from 02/02/2018, CT from 01/20/2018 FINDINGS: Lower chest: No acute abnormality. Hepatobiliary: Liver is within normal limits. The gallbladder is decompressed. Previously seen wall thickening in the gallbladder on CT has resolved in the interval. Small hypodensity is noted in the  lateral segment of the left lobe of the liver consistent with a tiny cyst. Pancreas: Unremarkable. No pancreatic ductal dilatation or surrounding inflammatory changes. Spleen: Spleen is enlarged similar to that seen on prior PET-CT although no focal mass is noted. Adrenals/Urinary Tract: Adrenal glands are within normal limits. Kidneys are well visualized bilaterally.  Nonobstructing renal calculi are seen bilaterally. The collecting systems and ureters are within normal limits. The bladder is well distended. Stable densities are noted at the base of the bladder stable from previous exams. Stomach/Bowel: Multiple dilated loops of jejunum are identified increased when compared with the prior PET-CT from 4 days previous. Fecalization of small bowel contents is noted within the proximal ileum. Transition zone is noted deep within the pelvis centrally although no focal mass lesion is noted. These changes are likely related to underlying adhesions. The more distal ileum is within normal limits. The colon shows fecal material within. The appendix is within normal limits. Vascular/Lymphatic: Aortic atherosclerosis. No enlarged abdominal or pelvic lymph nodes. Reproductive: Uterus and bilateral adnexa are unremarkable. Other: No abdominal wall hernia or abnormality. No abdominopelvic ascites. Musculoskeletal: Degenerative changes of lumbar spine are noted. No acute bony abnormality is seen. IMPRESSION: New proximal small bowel obstruction extending from the proximal jejunum into the proximal ileum. A transition zone is noted deep in the pelvis although incompletely evaluated without oral or IV contrast. This is likely related to adhesions given a relatively negative PET-CT from 4 days previous. The remainder of the exam is stable from the recent exams. Electronically Signed   By: Inez Catalina M.D.   On: 02/07/2018 19:19   Dg Chest Port 1 View  Result Date: 02/07/2018 CLINICAL DATA:  Emesis, cough, history of lymphoma  EXAM: PORTABLE CHEST 1 VIEW COMPARISON:  02/02/2018 FINDINGS: Right-sided PICC line is noted in satisfactory position. Cardiac shadow is within normal limits. The lungs are well aerated without focal infiltrate. No bony abnormality is seen. IMPRESSION: No acute abnormality noted. Electronically Signed   By: Inez Catalina M.D.   On: 02/07/2018 15:58   Dg Abd Portable 1v-small Bowel Protocol-position Verification  Result Date: 02/08/2018 CLINICAL DATA:  NG tube placement. EXAM: PORTABLE ABDOMEN - 1 VIEW COMPARISON:  CT abdomen dated 02/07/2018. FINDINGS: Nasogastric tube appears adequately positioned in the stomach. Distended gas-filled loops of small bowel are seen within the central abdomen and upper pelvis, corresponding to the appearance on yesterday's abdomen CT. IMPRESSION: Nasogastric tube appears adequately positioned in the stomach. Continued evidence of small bowel obstruction. Electronically Signed   By: Franki Cabot M.D.   On: 02/08/2018 08:23     T. Navos [facility] Pager [phone]  If 7PM-7AM, please contact night-coverage [email or web address] Password [facility] 02/08/2018, 12:05 PM

## 2018-02-08 NOTE — Progress Notes (Signed)
Patient transfer to ICU  at this time per MD order.Kristin Good

## 2018-02-09 ENCOUNTER — Inpatient Hospital Stay (HOSPITAL_COMMUNITY): Payer: Commercial Managed Care - PPO

## 2018-02-09 ENCOUNTER — Ambulatory Visit (HOSPITAL_COMMUNITY): Payer: Commercial Managed Care - PPO

## 2018-02-09 DIAGNOSIS — E785 Hyperlipidemia, unspecified: Secondary | ICD-10-CM

## 2018-02-09 DIAGNOSIS — C8440 Peripheral T-cell lymphoma, not classified, unspecified site: Secondary | ICD-10-CM

## 2018-02-09 LAB — BPAM RBC
BLOOD PRODUCT EXPIRATION DATE: 202002142359
Blood Product Expiration Date: 202002202359
ISSUE DATE / TIME: 202001261151
ISSUE DATE / TIME: 202001261432
Unit Type and Rh: 7300
Unit Type and Rh: 7300

## 2018-02-09 LAB — CBC
HCT: 19.8 % — ABNORMAL LOW (ref 36.0–46.0)
HCT: 25.7 % — ABNORMAL LOW (ref 36.0–46.0)
Hemoglobin: 6.5 g/dL — CL (ref 12.0–15.0)
Hemoglobin: 8.5 g/dL — ABNORMAL LOW (ref 12.0–15.0)
MCH: 29.7 pg (ref 26.0–34.0)
MCH: 29.6 pg (ref 26.0–34.0)
MCHC: 32.8 g/dL (ref 30.0–36.0)
MCHC: 33.1 g/dL (ref 30.0–36.0)
MCV: 90.4 fL (ref 80.0–100.0)
MCV: 89.5 fL (ref 80.0–100.0)
NRBC: 0 % (ref 0.0–0.2)
PLATELETS: 12 10*3/uL — AB (ref 150–400)
Platelets: 12 10*3/uL — CL (ref 150–400)
RBC: 2.19 MIL/uL — ABNORMAL LOW (ref 3.87–5.11)
RBC: 2.87 MIL/uL — ABNORMAL LOW (ref 3.87–5.11)
RDW: 14.8 % (ref 11.5–15.5)
RDW: 15.1 % (ref 11.5–15.5)
WBC: 0.1 10*3/uL — CL (ref 4.0–10.5)
WBC: 0.2 10*3/uL — CL (ref 4.0–10.5)
nRBC: 0 % (ref 0.0–0.2)

## 2018-02-09 LAB — BPAM PLATELET PHERESIS
Blood Product Expiration Date: 202001262359
ISSUE DATE / TIME: 202001260436
Unit Type and Rh: 6200

## 2018-02-09 LAB — TYPE AND SCREEN
ABO/RH(D): B POS
Antibody Screen: NEGATIVE
UNIT DIVISION: 0
Unit division: 0

## 2018-02-09 LAB — GLUCOSE, CAPILLARY
GLUCOSE-CAPILLARY: 164 mg/dL — AB (ref 70–99)
Glucose-Capillary: 116 mg/dL — ABNORMAL HIGH (ref 70–99)
Glucose-Capillary: 128 mg/dL — ABNORMAL HIGH (ref 70–99)
Glucose-Capillary: 161 mg/dL — ABNORMAL HIGH (ref 70–99)

## 2018-02-09 LAB — PREPARE PLATELET PHERESIS: Unit division: 0

## 2018-02-09 LAB — MAGNESIUM: Magnesium: 1.8 mg/dL (ref 1.7–2.4)

## 2018-02-09 LAB — BASIC METABOLIC PANEL
Anion gap: 9 (ref 5–15)
BUN: 31 mg/dL — ABNORMAL HIGH (ref 6–20)
CALCIUM: 8.1 mg/dL — AB (ref 8.9–10.3)
CO2: 23 mmol/L (ref 22–32)
CREATININE: 0.89 mg/dL (ref 0.44–1.00)
Chloride: 112 mmol/L — ABNORMAL HIGH (ref 98–111)
GFR calc Af Amer: >60 mL/min (ref >60)
GFR calc non Af Amer: >60 mL/min (ref >60)
Glucose, Bld: 165 mg/dL — ABNORMAL HIGH (ref 70–99)
Potassium: 2.6 mmol/L — CL (ref 3.5–5.1)
Sodium: 144 mmol/L (ref 135–145)

## 2018-02-09 LAB — TRANSFUSION REACTION

## 2018-02-09 LAB — URINE CULTURE: Culture: NO GROWTH

## 2018-02-09 MED ORDER — POTASSIUM CHLORIDE CRYS ER 20 MEQ PO TBCR
40.0000 meq | EXTENDED_RELEASE_TABLET | ORAL | Status: AC
  Administered 2018-02-09 (×2): 40 meq via ORAL
  Filled 2018-02-09 (×2): qty 2

## 2018-02-09 MED ORDER — POTASSIUM CHLORIDE 20 MEQ/15ML (10%) PO SOLN
40.0000 meq | ORAL | Status: DC
  Filled 2018-02-09: qty 30

## 2018-02-09 MED ORDER — MAGIC MOUTHWASH W/LIDOCAINE
5.0000 mL | Freq: Three times a day (TID) | ORAL | Status: DC
  Administered 2018-02-09 – 2018-02-10 (×4): 5 mL via ORAL
  Filled 2018-02-09 (×7): qty 5

## 2018-02-09 MED ORDER — PANTOPRAZOLE SODIUM 40 MG IV SOLR
40.0000 mg | INTRAVENOUS | Status: DC
  Administered 2018-02-09: 40 mg via INTRAVENOUS
  Filled 2018-02-09: qty 40

## 2018-02-09 MED ORDER — POTASSIUM CHLORIDE 10 MEQ/100ML IV SOLN
10.0000 meq | INTRAVENOUS | Status: AC
  Administered 2018-02-09 (×4): 10 meq via INTRAVENOUS
  Filled 2018-02-09 (×4): qty 100

## 2018-02-09 NOTE — Progress Notes (Addendum)
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  Kristin Good was found to have a SBO, and underwent NGT placement.  She was started on IV Meropenem on 1/26 and her abdomen has improved.  She is now having bowel movements, and her obstruction continues to resolve.  Her NGT was pulled today.    Kristin Good has continued to do well.  T max was 99.4 in past 24 hours and cultures have remained negative to date.  Chest xray was negative.  She is tolerating the Meropenem well.  She has required transfusions for her anemia/thrombocytopenia.  She has not yet gotten out of bed, but plans to walk later today.  She notes that she doesn't have a cough, shortness of breath, chest pain, or palpitations.  She denies any new or worsening pain.  She has no swelling in her lower extremities.  A detailed ROS was otherwise non contributory.     Objective:  Vitals:   02/09/18 1000 02/09/18 1100  BP:    Pulse: 86 81  Resp: 17 16  Temp:    SpO2: 100% 100%    Body mass index is 20.37 kg/m.  Intake/Output Summary (Last 24 hours) at 02/09/2018 1617 Last data filed at 02/08/2018 2000 Gross per 24 hour  Intake 814.15 ml  Output -  Net 814.15 ml     Sclerae unicteric  Oropharynx shows no thrush or other lesions  No cervical or supraclavicular adenopathy  Lungs no rales or wheezes--auscultated anterolaterally  Heart regular rate and rhythm  Abdomen soft, +BS  Neuro nonfocal  Extremities are without edema  CBG (last 3)  Recent Labs    02/08/18 2130 02/09/18 0749 02/09/18 1155  GLUCAP 125* 128* 116*     Labs:  Lab Results  Component Value Date   WBC 0.2 (LL) 02/09/2018   HGB 8.5 (L) 02/09/2018   HCT 25.7 (L) 02/09/2018   MCV 89.5 02/09/2018   PLT 12 (LL) 02/09/2018   NEUTROABS 0.0 (L) 02/07/2018     @LASTCHEMISTRY @  Urine Studies No results for input(s): UHGB, CRYS in the last 72 hours.  Invalid input(s): UACOL, UAPR, USPG, UPH, UTP, UGL, [place], UBIL, UNIT, UROB, [place], UEPI, [place], [place]  Basic Metabolic Panel: Recent Labs  Lab 02/05/18 0914 02/07/18 1445 02/07/18 1626 02/08/18 0728 02/09/18 0627  NA 138 131*  --  139 144  K 3.9 3.9  --  2.6* 2.6*  CL 104 92*  --  110 112*  CO2 23 24  --  23 23  GLUCOSE 201* 340*  --  198* 165*  BUN 40* 53*  --  42* 31*  CREATININE 1.07* 1.74*  --  1.07* 0.89  CALCIUM 8.7* 9.4  --  7.3* 8.1*  MG  --   --  1.8  --  1.8   GFR Estimated Creatinine Clearance: 56.1 mL/min (by C-G formula based on SCr of 0.89 mg/dL). Liver Function Tests: Recent Labs  Lab 02/05/18 0914 02/07/18 1445 02/08/18 0728  AST 16 11* 7*  ALT 30 17 12   ALKPHOS 149* 124 87  BILITOT 1.3* 1.3* 0.8  PROT 5.5* 6.5 4.8*  ALBUMIN 3.5 3.5 2.4*   Recent Labs  Lab 02/07/18 1455  LIPASE 19   No results for input(s): AMMONIA in the last 168 hours. Coagulation  profile Recent Labs  Lab 02/07/18 1445  INR 1.14    CBC: Recent Labs  Lab 02/05/18 0914 02/07/18 1445 02/08/18 0728 02/08/18 2016 02/09/18 0627  WBC 0.4* 0.3* 0.1*  --  0.2*  NEUTROABS 0.0* 0.0*  --   --   --   HGB 9.8* 9.9* 6.5* 9.0* 8.5*  HCT 29.9* 29.8* 19.8* 26.7* 25.7*  MCV 89.8 86.9 90.4  --  89.5  PLT 15* 8* 12*  --  12*   Cardiac Enzymes: Recent Labs  Lab 02/07/18 1455  TROPONINI <0.03   BNP: Invalid input(s): POCBNP CBG: Recent Labs  Lab 02/08/18 1129 02/08/18 1649 02/08/18 2130 02/09/18 0749 02/09/18 1155  GLUCAP 142* 111* 125* 128* 116*   D-Dimer No results for input(s): DDIMER in the last 72 hours. Hgb A1c No results for input(s): HGBA1C in the last 72 hours. Lipid Profile No results for input(s): CHOL, HDL, LDLCALC, TRIG, CHOLHDL, LDLDIRECT in the last 72 hours. Thyroid function studies No results for input(s): TSH, T4TOTAL, T3FREE,  THYROIDAB in the last 72 hours.  Invalid input(s): FREET3 Anemia work up No results for input(s): VITAMINB12, FOLATE, FERRITIN, TIBC, IRON, RETICCTPCT in the last 72 hours. Microbiology Recent Results (from the past 240 hour(s))  Culture, blood (Routine x 2)     Status: None (Preliminary result)   Collection Time: 02/07/18  2:44 PM  Result Value Ref Range Status   Specimen Description BLOOD LEFT ARM  Final   Special Requests   Final    BOTTLES DRAWN AEROBIC AND ANAEROBIC Blood Culture adequate volume   Culture   Final    NO GROWTH 2 DAYS Performed at [facility], [address] [place], [place] [address]    Report Status PENDING  Incomplete  Culture, blood (Routine x 2)     Status: None (Preliminary result)   Collection Time: 02/07/18  2:54 PM  Result Value Ref Range Status   Specimen Description BLOOD LEFT ARM  Final   Special Requests   Final    BOTTLES DRAWN AEROBIC AND ANAEROBIC Blood Culture adequate volume   Culture   Final    NO GROWTH 2 DAYS Performed at [facility], [address] [place], [place] [address]    Report Status PENDING  Incomplete  Culture, body fluid-bottle     Status: None (Preliminary result)   Collection Time: 02/07/18  4:26 PM  Result Value Ref Range Status   Specimen Description FLUID  Final   Special Requests PLATELET PHERESIS [id number] DIV 00  Final   Culture   Final    NO GROWTH 2 DAYS Performed at [facility], [address] [place], [place] [address]    Report Status PENDING  Incomplete  Gram stain     Status: None   Collection Time: 02/07/18  4:26 PM  Result Value Ref Range Status   Specimen Description FLUID  Final   Special Requests PLATELET PHERESIS [id number] 0 DIV 00  Final   Gram Stain   Final    NO ORGANISMS SEEN Gram Stain Report Called to,Read Back By and Verified With: DR. PATRICK AT 2231 ON 02/07/2018 BY EVA GRAM STAIN AND CULTURE PERFORMED BY PROCEDURE [id number] Performed at [facility], [address],  [place],  [address]    Report Status 02/07/2018 FINAL  Final  Culture, Urine     Status: None   Collection Time: 02/07/18 11:45 PM  Result Value Ref Range Status   Specimen Description   Final  URINE, RANDOM Performed at [facility], [place] [address] [place], [place] [address]    Special Requests   Final    NONE Performed at [facility], [address] [place], [place] [address]    Culture   Final    NO GROWTH Performed at [facility], [place] [address], [place], [place] [address]    Report Status 02/09/2018 FINAL  Final  MRSA PCR Screening     Status: None   Collection Time: 02/08/18  4:24 PM  Result Value Ref Range Status   MRSA by PCR NEGATIVE NEGATIVE Final    Comment:        The GeneXpert MRSA Assay (FDA approved for NASAL specimens only), is one component of a comprehensive MRSA colonization surveillance program. It is not intended to diagnose MRSA infection nor to guide or monitor treatment for MRSA infections. Performed at [facility], [place] [address], [place], [place] [address]       Studies:  Ct Abdomen Pelvis Wo Contrast  Result Date: 02/07/2018 CLINICAL DATA:  History of T-cell lymphoma with recent vomiting EXAM: CT ABDOMEN AND PELVIS WITHOUT CONTRAST TECHNIQUE: Multidetector CT imaging of the abdomen and pelvis was performed following the standard protocol without IV contrast. COMPARISON:  PET-CT from 02/02/2018, CT from 01/20/2018 FINDINGS: Lower chest: No acute abnormality. Hepatobiliary: Liver is within normal limits. The gallbladder is decompressed. Previously seen wall thickening in the gallbladder on CT has resolved in the interval. Small hypodensity is noted in the lateral segment of the left lobe of the liver consistent with a tiny cyst. Pancreas: Unremarkable. No pancreatic ductal dilatation or surrounding inflammatory changes. Spleen: Spleen is enlarged similar to that seen on prior PET-CT although no focal  mass is noted. Adrenals/Urinary Tract: Adrenal glands are within normal limits. Kidneys are well visualized bilaterally. Nonobstructing renal calculi are seen bilaterally. The collecting systems and ureters are within normal limits. The bladder is well distended. Stable densities are noted at the base of the bladder stable from previous exams. Stomach/Bowel: Multiple dilated loops of jejunum are identified increased when compared with the prior PET-CT from 4 days previous. Fecalization of small bowel contents is noted within the proximal ileum. Transition zone is noted deep within the pelvis centrally although no focal mass lesion is noted. These changes are likely related to underlying adhesions. The more distal ileum is within normal limits. The colon shows fecal material within. The appendix is within normal limits. Vascular/Lymphatic: Aortic atherosclerosis. No enlarged abdominal or pelvic lymph nodes. Reproductive: Uterus and bilateral adnexa are unremarkable. Other: No abdominal wall hernia or abnormality. No abdominopelvic ascites. Musculoskeletal: Degenerative changes of lumbar spine are noted. No acute bony abnormality is seen. IMPRESSION: New proximal small bowel obstruction extending from the proximal jejunum into the proximal ileum. A transition zone is noted deep in the pelvis although incompletely evaluated without oral or IV contrast. This is likely related to adhesions given a relatively negative PET-CT from 4 days previous. The remainder of the exam is stable from the recent exams. Electronically Signed   By: Inez Catalina M.D.   On: 02/07/2018 19:19   Dg Abd Portable 1v  Result Date: 02/09/2018 CLINICAL DATA:  Small bowel obstruction.  Abdominal pain. EXAM: PORTABLE ABDOMEN - 1 VIEW COMPARISON:  One-view abdomen 02/08/2018 FINDINGS: Small bowel dilation is improving. There is no residual obstruction. Contrast is progressing through the colon. IMPRESSION: Resolving small bowel obstruction.  Electronically Signed   By: Wynetta Fines.D.  On: 02/09/2018 08:13   Dg Abd Portable 1v-small Bowel Obstruction Protocol-initial, 8 Hr Delay  Result Date: 02/08/2018 CLINICAL DATA:  8 hour follow-up small-bowel obstruction EXAM: PORTABLE ABDOMEN - 1 VIEW COMPARISON:  02/08/2018 FINDINGS: Nasogastric catheter is again noted within the stomach. Contrast material administered previously now lies predominately within the small bowel which remains dilated. Some mild proximal colonic contrast is present. IMPRESSION: Persistent small bowel dilatation. Minimal contrast is noted within the proximal colon. Electronically Signed   By: Inez Catalina M.D.   On: 02/08/2018 18:11   Dg Abd Portable 1v-small Bowel Protocol-position Verification  Result Date: 02/08/2018 CLINICAL DATA:  NG tube placement. EXAM: PORTABLE ABDOMEN - 1 VIEW COMPARISON:  CT abdomen dated 02/07/2018. FINDINGS: Nasogastric tube appears adequately positioned in the stomach. Distended gas-filled loops of small bowel are seen within the central abdomen and upper pelvis, corresponding to the appearance on yesterday's abdomen CT. IMPRESSION: Nasogastric tube appears adequately positioned in the stomach. Continued evidence of small bowel obstruction. Electronically Signed   By: Franki Cabot M.D.   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  1. T cell lymphoma: s/p chemotherapy with CHOEP x 1 cycle.  She sees Dr. Tera Helper at [facility] regularly.  She did receive Neulasta support, so Granix was discontinued today.    2. Pancytopenia: related to her lymphoma involvement.  Continue to transfuse as indicated.  She may not be able to achieve count recovery prior to discharge, due to the lymphoma involvement in her bone marrow.    3. Febrile neutropenia: reviewed with Dr.  Lindi Adie, who will see patient later today.  She will continue Meropenem for one more day (so long as blood cultures remain negative), and would trial discontinuing meropenem with 24 observation.  If remains afebrile, then can be discharged with continue close bi-weekly clinic f/u at [facility].    D/c planning--also reviewed that she needs to be able to tolerate foods, liquids, and be mobile prior to discharge.      4. Other medical problems as per primary hospitalist team.  We appreciate your great care of our mutual patient.    Thank you very much for this consult.  For any questions or concerns, please reach out to [place].   Scot Dock, NP 02/09/2018  4:17 PM Medical Oncology and Hematology [facility] [address] [place], [place] [address] Tel. [phone]    Fax. [phone] Office [phone], Monday through Friday 8-4pm   Attending Note  I personally saw the patient, reviewed the chart and examined the patient. The plan of care was discussed with the patient and the admitting team. I agree with the assessment and plan as documented above. T  Febrile neutropenia: Neutropenia is due to her lymphoma inflitration of bone marrow and recent chemo. She received Neulasta previously per Dr.Katragadda. So we discontinued Granix. Continue Meropenam for another day. Watch for 24 hours after stopping Meropenam If Afebrile, then plan discharge in 48 hours to be followed closely at [facility].  Severe Hypokalemia Anemia: S/P 2 units Yday Thrombocytopenia: Platelets stable at 12.  Will follow closely

## 2018-02-09 NOTE — Progress Notes (Signed)
Hem-Onc  I spoke to Dr. Delton Coombes regarding Mrs. Veloso. After chemotherapy with CHOEP on 01/28/18 (Etoposide for 3 days), patient apparantly received Neulasta. Therefore we can discontinue Granix. However because of neutropenic fever she will need broad-spectrum antibiotics until the fever subsides and her counts start to improve.  Since the patient presented with pancytopenia as the symptom that led to the diagnosis of T-cell lymphoma, until the lymphoma improves, the pancytopenia may not significantly improve.

## 2018-02-09 NOTE — Progress Notes (Signed)
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   In ED, hypotensive, tachycardic, febrile, leukopenic and thrombocytopenic.  Lactic acid 3.0.  Creatinine 1.7 (baseline 0.9).  Chest x-ray without acute finding.  CT abdomen with small bowel obstruction.  Urinalysis not impressive. Per admitting provider, oncology, Dr. Lindi Adie consulted and recommended platelet transfusion, Granix and broad-spectrum antibiotic.  Dr. Lindi Adie did not feel strongly about transfer to [facility] long.  Patient was resuscitated with IV fluid.  Cultures obtained.  Started on broad-spectrum antibiotics and admitted.  Received 1 unit of platelets overnight.  With all that, tachycardia, hypotension and lactic acidosis resolved.  Thrombocytopenia improved.  However, she had worsening leukopenia and anemia.  Dr. Langston Masker consulted 1/26 formally and suggested 2 units of blood and continuing Granix 300 mg daily.   In regards to SBO, general surgery consulted and started NGT.   Subjective: No major events overnight.  Reports improvement in her abdominal pain.  Reports having 3 bowel movements since last night.  Denies blood in stool.  NG tube in place.  Denies chest pain, dyspnea or other complaints.  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  Unclear source of infection  but suspect GI source based on her presentation.  Blood and urine cultures negative.  MRSA PCR negative.  Fever curve going down.  She is severely leukopenic. -Continue meropenem for at least 24 hours given her severe leukopenia.   Pancytopenia/T-cell lymphoma of the spleen: followed by Dr. Delton Coombes in [place].  Thrombocytopenia improved after platelet transfusion and remained stable.  Hemoglobin responded appropriately after 2 units on 1/26. -Continue monitoring -No further platelet transfusion unless notable bleeding per oncology. -Granix discontinued by oncology this morning.  SBO: Noted on CT abdomen and DG abdomen.  Improved.  Had 3 semisolid bowel movements overnight -General surgery following.   AKI: Resolved.  -We will monitor as needed  Hypokalemia: Potassium 2.6.  Magnesium within normal. -Replenish and recheck -May be able to give additional p.o. potassium this afternoon  Essential hypertension: Hypotensive on presentation.  Currently normotensive. -Hold home antihypertensive medications -IV fluid in the setting of SBO.  Hypothyroidism: -Continue home Synthroid  Prediabetes: Last A1c 5.7 this months.  On metformin at home. -CBG monitoring -Sliding scale insulin  Scheduled Meds: . sodium chloride   Intravenous Once  . Chlorhexidine Gluconate Cloth  6 each Topical Daily  . insulin aspart  0-9 Units Subcutaneous TID WC  . magic mouthwash w/lidocaine  5 mL Oral TID  . pantoprazole (PROTONIX) IV  40 mg Intravenous Q24H  . vitamin B-12  1,000 mcg Oral Daily  . vitamin C  500 mg Oral BID   Continuous Infusions: . fluconazole (DIFLUCAN) IV 100 mg (02/09/18 1042)  . meropenem (MERREM) IV 1 g (02/09/18 0622)  . potassium chloride 10 mEq (02/09/18 1037)   PRN Meds:.acetaminophen **OR** acetaminophen, ondansetron **OR** ondansetron (ZOFRAN) IV, polyethylene glycol,  sodium chloride flush  DVT prophylaxis: SCD Code Status: Full code Family Communication: None at  bedside Disposition Plan: Remains in SDU  Consultants:   Oncology  General surgery  Procedures:   NG tube  Antimicrobials:  Meropenem 1/26--  Objective: Vitals:   02/09/18 0200 02/09/18 0400 02/09/18 0600 02/09/18 0800  BP: 133/65 125/70 132/71 136/63  Pulse: 88 84 83 83  Resp: 19 16 (!) 21 18  Temp:    (!) 97.5 F (36.4 C)  TempSrc:    Oral  SpO2: 99% 98% 100% 100%  Weight:      Height:        Intake/Output Summary (Last 24 hours) at 02/09/2018 1113 Last data filed at 02/08/2018 2000 Gross per 24 hour  Intake 1118.15 ml  Output -  Net 1118.15 ml   Filed Weights   02/07/18 1416  Weight: 52.2 kg    Examination:  GENERAL: No acute distress.  HEENT: MMM.  Vision and Hearing grossly intact.  NG tube in place. NECK: Supple.  LUNGS:  No IWOB. Good air movement. CTAB.  HEART:  RRR. Heart sounds normal.  ABD: Bowel sounds present. Soft. Mild diffuse tenderness EXT:   no edema bilaterally.  SKIN: no apparent skin lesion.  NEURO: Awake, alert and oriented appropriately.  No gross deficit.  PSYCH: Calm. Normal affect.     Data Reviewed: I have independently reviewed following labs and imaging studies  CBC: Recent Labs  Lab 02/02/18 1507 02/05/18 0914 02/07/18 1445 02/08/18 0728 02/08/18 2016 02/09/18 0627  WBC 1.2* 0.4* 0.3* 0.1*  --  0.2*  NEUTROABS 0.2* 0.0* 0.0*  --   --   --   HGB 10.4* 9.8* 9.9* 6.5* 9.0* 8.5*  HCT 32.0* 29.9* 29.8* 19.8* 26.7* 25.7*  MCV 90.7 89.8 86.9 90.4  --  89.5  PLT 39* 15* 8* 12*  --  12*   Basic Metabolic Panel: Recent Labs  Lab 02/02/18 1507 02/05/18 0914 02/07/18 1445 02/07/18 1626 02/08/18 0728 02/09/18 0627  NA 136 138 131*  --  139 144  K 3.7 3.9 3.9  --  2.6* 2.6*  CL 104 104 92*  --  110 112*  CO2 21* 23 24  --  23 23  GLUCOSE 256* 201* 340*  --  198* 165*  BUN 50* 40* 53*  --  42* 31*  CREATININE 1.09* 1.07* 1.74*  --  1.07* 0.89  CALCIUM 8.7* 8.7* 9.4  --  7.3* 8.1*  MG 2.3  --   --  1.8  --  1.8    PHOS 4.7*  --   --   --   --   --    GFR: Estimated Creatinine Clearance: 56.1 mL/min (by C-G formula based on SCr of 0.89 mg/dL). Liver Function Tests: Recent Labs  Lab 02/02/18 1507 02/05/18 0914 02/07/18 1445 02/08/18 0728  AST 37 16 11* 7*  ALT 58* 30 17 12   ALKPHOS 194* 149* 124 87  BILITOT 0.8 1.3* 1.3* 0.8  PROT 5.3* 5.5* 6.5 4.8*  ALBUMIN 3.4* 3.5 3.5 2.4*   Recent Labs  Lab 02/07/18 1455  LIPASE 19   No results for input(s): AMMONIA in the last 168 hours. Coagulation Profile: Recent Labs  Lab 02/07/18 1445  INR 1.14   Cardiac Enzymes: Recent Labs  Lab 02/07/18 1455  TROPONINI <0.03   BNP (last 3 results) No results for input(s): PROBNP in the last 8760 hours. HbA1C: No results for input(s): HGBA1C in the last 72 hours. CBG:  Recent Labs  Lab 02/08/18 0809 02/08/18 1129 02/08/18 1649 02/08/18 2130 02/09/18 0749  GLUCAP 165* 142* 111* 125* 128*   Lipid Profile: No results for input(s): CHOL, HDL, LDLCALC, TRIG, CHOLHDL, LDLDIRECT in the last 72 hours. Thyroid Function Tests: No results for input(s): TSH, T4TOTAL, FREET4, T3FREE, THYROIDAB in the last 72 hours. Anemia Panel: No results for input(s): VITAMINB12, FOLATE, FERRITIN, TIBC, IRON, RETICCTPCT in the last 72 hours. Urine analysis:    Component Value Date/Time   COLORURINE YELLOW 02/07/2018 2345   APPEARANCEUR HAZY (A) 02/07/2018 2345   LABSPEC 1.015 02/07/2018 2345   PHURINE 6.0 02/07/2018 2345   GLUCOSEU NEGATIVE 02/07/2018 2345   HGBUR SMALL (A) 02/07/2018 2345   BILIRUBINUR NEGATIVE 02/07/2018 2345   [place] 02/07/2018 2345   PROTEINUR 100 (A) 02/07/2018 2345   NITRITE NEGATIVE 02/07/2018 2345   LEUKOCYTESUR NEGATIVE 02/07/2018 2345   Sepsis Labs: Invalid input(s): PROCALCITONIN, LACTICIDVEN  Recent Results (from the past 240 hour(s))  Culture, blood (Routine x 2)     Status: None (Preliminary result)   Collection Time: 02/07/18  2:44 PM  Result Value Ref Range  Status   Specimen Description BLOOD LEFT ARM  Final   Special Requests   Final    BOTTLES DRAWN AEROBIC AND ANAEROBIC Blood Culture adequate volume   Culture   Final    NO GROWTH 2 DAYS Performed at [facility], [address], [place], [place] [address]    Report Status PENDING  Incomplete  Culture, blood (Routine x 2)     Status: None (Preliminary result)   Collection Time: 02/07/18  2:54 PM  Result Value Ref Range Status   Specimen Description BLOOD LEFT ARM  Final   Special Requests   Final    BOTTLES DRAWN AEROBIC AND ANAEROBIC Blood Culture adequate volume   Culture   Final    NO GROWTH 2 DAYS Performed at [facility], [address] [place], [place] [address]    Report Status PENDING  Incomplete  Culture, body fluid-bottle     Status: None (Preliminary result)   Collection Time: 02/07/18  4:26 PM  Result Value Ref Range Status   Specimen Description FLUID  Final   Special Requests PLATELET PHERESIS [id number] DIV 00  Final   Culture   Final    NO GROWTH 2 DAYS Performed at [facility], [address] [place], [place] [address]    Report Status PENDING  Incomplete  Gram stain     Status: None   Collection Time: 02/07/18  4:26 PM  Result Value Ref Range Status   Specimen Description FLUID  Final   Special Requests PLATELET PHERESIS [id number] DIV 00  Final   Gram Stain   Final    NO ORGANISMS SEEN Gram Stain Report Called to,Read Back By and Verified With: DR. PATRICK AT 2231 ON 02/07/2018 BY EVA GRAM STAIN AND CULTURE PERFORMED BY PROCEDURE [id number] Performed at [facility], [address] [place], [place] [address]    Report Status 02/07/2018 FINAL  Final  Culture, Urine     Status: None   Collection Time: 02/07/18 11:45 PM  Result Value Ref Range Status   Specimen Description   Final    URINE, RANDOM Performed at [place] [address], [place], [place] [address]    Special Requests   Final    NONE Performed at [facility], [address] [place], [place] [address]  Culture   Final    NO GROWTH Performed at [facility], [place] [address], [place], [place] [address]    Report Status 02/09/2018 FINAL  Final  MRSA PCR Screening     Status: None   Collection Time: 02/08/18  4:24 PM  Result Value Ref Range Status   MRSA by PCR NEGATIVE NEGATIVE Final    Comment:        The GeneXpert MRSA Assay (FDA approved for NASAL specimens only), is one component of a comprehensive MRSA colonization surveillance program. It is not intended to diagnose MRSA infection nor to guide or monitor treatment for MRSA infections. Performed at [facility], [place] [address], [place], [place] [address]       Radiology Studies: Dg Abd Portable 1v  Result Date: 02/09/2018 CLINICAL DATA:  Small bowel obstruction.  Abdominal pain. EXAM: PORTABLE ABDOMEN - 1 VIEW COMPARISON:  One-view abdomen 02/08/2018 FINDINGS: Small bowel dilation is improving. There is no residual obstruction. Contrast is progressing through the colon. IMPRESSION: Resolving small bowel obstruction. Electronically Signed   By: San Morelle M.D.   On: 02/09/2018 08:13   Dg Abd Portable 1v-small Bowel Obstruction Protocol-initial, 8 Hr Delay  Result Date: 02/08/2018 CLINICAL DATA:  8 hour follow-up small-bowel obstruction EXAM: PORTABLE ABDOMEN - 1 VIEW COMPARISON:  02/08/2018 FINDINGS: Nasogastric catheter is again noted within the stomach. Contrast material administered previously now lies predominately within the small bowel which remains dilated. Some mild proximal colonic contrast is present. IMPRESSION: Persistent small bowel dilatation. Minimal contrast is noted within the proximal colon. Electronically Signed   By: Inez Catalina M.D.   On: 02/08/2018 18:11    Davyn Elsasser T. [facility] Pager [phone]  If 7PM-7AM, please contact night-coverage [email or web address] Password [email or web address] 02/09/2018, 11:13 AM

## 2018-02-09 NOTE — Care Management Note (Signed)
Case Management Note  Patient Details  Name: MARKITA STCHARLES MRN: [id number] Date of Birth: [birth year]  Subjective/Objective:                  Discharge Readiness Return to top of Intestinal Obstruction RRG - [place]  Discharge readiness is indicated by patient meeting Recovery Milestones, including ALL of the following: ? Hemodynamic stabilityyes ? Nausea and vomiting absent or controlled and acceptable for next level of care has Williams tube to suction clamping trial [id number] ? Has improved over last 24 hours yes ? Electrolytes normal or acceptable for next level of care no K+=2.6 ? Pain absent or managed managed ? Surgery not indicated not at this time ? Ambulatory[O] yes ? Oral hydration, medications, and diet ? Npo, iv diflucan/iv merrem/iv kcl 10 meq    Action/Plan: Following for progression of care and condition. Following for cm needs none present at this time.  Expected Discharge Date:                  Expected Discharge Plan:     In-House Referral:     Discharge planning Services     Post Acute Care Choice:    Choice offered to:     DME Arranged:    DME Agency:     HH Arranged:    HH Agency:     Status of Service:     If discussed at [employer], dates discussed:    Additional Comments:  Leeroy Cha, RN 02/09/2018, 9:44 AM

## 2018-02-09 NOTE — Progress Notes (Signed)
[facility] Progress Note     Subjective: CC-  Patient states that she is feeling much better today. Abdominal pain/bloating improving. Denies n/v. She had multiple loose BMs over night. Also passing some flatus.   Objective: Vital signs in last 24 hours: Temp:  [98 F (36.7 C)-99.4 F (37.4 C)] 98 F (36.7 C) (01/26 2300) Pulse Rate:  [83-109] 83 (01/27 0600) Resp:  [16-24] 21 (01/27 0600) BP: (121-172)/(58-79) 132/71 (01/27 0600) SpO2:  [97 %-100 %] 100 % (01/27 0600) Last BM Date: 02/09/18  Intake/Output from previous day: 01/26 0701 - 01/27 0700 In: 1118.2 [Blood:580; IV Piggyback:538.2] Out: -  Intake/Output this shift: No intake/output data recorded.  PE: Gen:  Alert, NAD, pleasant HEENT: EOM's intact, pupils equal and round Card:  RRR Pulm:  CTAB, no W/R/R, effort normal Abd: Soft, NT/ND, few BS heard, no HSM, no hernia Psych: A&Ox3  Skin: warm and dry  Lab Results:  Recent Labs    02/08/18 0728 02/08/18 2016 02/09/18 0627  WBC 0.1*  --  0.2*  HGB 6.5* 9.0* 8.5*  HCT 19.8* 26.7* 25.7*  PLT 12*  --  12*   BMET Recent Labs    02/08/18 0728 02/09/18 0627  NA 139 144  K 2.6* 2.6*  CL 110 112*  CO2 23 23  GLUCOSE 198* 165*  BUN 42* 31*  CREATININE 1.07* 0.89  CALCIUM 7.3* 8.1*   PT/INR Recent Labs    02/07/18 1445  LABPROT 14.5  INR 1.14   CMP     Component Value Date/Time   NA 144 02/09/2018 0627   K 2.6 (LL) 02/09/2018 0627   CL 112 (H) 02/09/2018 0627   CO2 23 02/09/2018 0627   GLUCOSE 165 (H) 02/09/2018 0627   BUN 31 (H) 02/09/2018 0627   CREATININE 0.89 02/09/2018 0627   CALCIUM 8.1 (L) 02/09/2018 0627   PROT 4.8 (L) 02/08/2018 0728   ALBUMIN 2.4 (L) 02/08/2018 0728   AST 7 (L) 02/08/2018 0728   ALT 12 02/08/2018 0728   ALKPHOS 87 02/08/2018 0728   BILITOT 0.8 02/08/2018 0728   GFRNONAA >60 02/09/2018 0627   GFRAA >60 02/09/2018 0627   Lipase     Component Value Date/Time   LIPASE 19 02/07/2018 1455        Studies/Results: Ct Abdomen Pelvis Wo Contrast  Result Date: 02/07/2018 CLINICAL DATA:  History of T-cell lymphoma with recent vomiting EXAM: CT ABDOMEN AND PELVIS WITHOUT CONTRAST TECHNIQUE: Multidetector CT imaging of the abdomen and pelvis was performed following the standard protocol without IV contrast. COMPARISON:  PET-CT from 02/02/2018, CT from 01/20/2018 FINDINGS: Lower chest: No acute abnormality. Hepatobiliary: Liver is within normal limits. The gallbladder is decompressed. Previously seen wall thickening in the gallbladder on CT has resolved in the interval. Small hypodensity is noted in the lateral segment of the left lobe of the liver consistent with a tiny cyst. Pancreas: Unremarkable. No pancreatic ductal dilatation or surrounding inflammatory changes. Spleen: Spleen is enlarged similar to that seen on prior PET-CT although no focal mass is noted. Adrenals/Urinary Tract: Adrenal glands are within normal limits. Kidneys are well visualized bilaterally. Nonobstructing renal calculi are seen bilaterally. The collecting systems and ureters are within normal limits. The bladder is well distended. Stable densities are noted at the base of the bladder stable from previous exams. Stomach/Bowel: Multiple dilated loops of jejunum are identified increased when compared with the prior PET-CT from 4 days previous. Fecalization of small bowel contents is noted within the proximal  ileum. Transition zone is noted deep within the pelvis centrally although no focal mass lesion is noted. These changes are likely related to underlying adhesions. The more distal ileum is within normal limits. The colon shows fecal material within. The appendix is within normal limits. Vascular/Lymphatic: Aortic atherosclerosis. No enlarged abdominal or pelvic lymph nodes. Reproductive: Uterus and bilateral adnexa are unremarkable. Other: No abdominal wall hernia or abnormality. No abdominopelvic ascites. Musculoskeletal:  Degenerative changes of lumbar spine are noted. No acute bony abnormality is seen. IMPRESSION: New proximal small bowel obstruction extending from the proximal jejunum into the proximal ileum. A transition zone is noted deep in the pelvis although incompletely evaluated without oral or IV contrast. This is likely related to adhesions given a relatively negative PET-CT from 4 days previous. The remainder of the exam is stable from the recent exams. Electronically Signed   By: Inez Catalina M.D.   On: 02/07/2018 19:19   Dg Chest Port 1 View  Result Date: 02/07/2018 CLINICAL DATA:  Emesis, cough, history of lymphoma EXAM: PORTABLE CHEST 1 VIEW COMPARISON:  02/02/2018 FINDINGS: Right-sided PICC line is noted in satisfactory position. Cardiac shadow is within normal limits. The lungs are well aerated without focal infiltrate. No bony abnormality is seen. IMPRESSION: No acute abnormality noted. Electronically Signed   By: Inez Catalina M.D.   On: 02/07/2018 15:58   Dg Abd Portable 1v  Result Date: 02/09/2018 CLINICAL DATA:  Small bowel obstruction.  Abdominal pain. EXAM: PORTABLE ABDOMEN - 1 VIEW COMPARISON:  One-view abdomen 02/08/2018 FINDINGS: Small bowel dilation is improving. There is no residual obstruction. Contrast is progressing through the colon. IMPRESSION: Resolving small bowel obstruction. Electronically Signed   By: San Morelle M.D.   On: 02/09/2018 08:13   Dg Abd Portable 1v-small Bowel Obstruction Protocol-initial, 8 Hr Delay  Result Date: 02/08/2018 CLINICAL DATA:  8 hour follow-up small-bowel obstruction EXAM: PORTABLE ABDOMEN - 1 VIEW COMPARISON:  02/08/2018 FINDINGS: Nasogastric catheter is again noted within the stomach. Contrast material administered previously now lies predominately within the small bowel which remains dilated. Some mild proximal colonic contrast is present. IMPRESSION: Persistent small bowel dilatation. Minimal contrast is noted within the proximal colon.  Electronically Signed   By: Inez Catalina M.D.   On: 02/08/2018 18:11   Dg Abd Portable 1v-small Bowel Protocol-position Verification  Result Date: 02/08/2018 CLINICAL DATA:  NG tube placement. EXAM: PORTABLE ABDOMEN - 1 VIEW COMPARISON:  CT abdomen dated 02/07/2018. FINDINGS: Nasogastric tube appears adequately positioned in the stomach. Distended gas-filled loops of small bowel are seen within the central abdomen and upper pelvis, corresponding to the appearance on yesterday's abdomen CT. IMPRESSION: Nasogastric tube appears adequately positioned in the stomach. Continued evidence of small bowel obstruction. Electronically Signed   By: Franki Cabot M.D.   On: 02/08/2018 08:23    Anti-infectives: Anti-infectives (From admission, onward)   Start     Dose/Rate Route Frequency Ordered Stop   02/08/18 1800  meropenem (MERREM) 1 g in sodium chloride 0.9 % 100 mL IVPB     1 g 200 mL/hr over 30 Minutes Intravenous Every 12 hours 02/08/18 1139     02/08/18 1000  fluconazole (DIFLUCAN) tablet 100 mg  Status:  Discontinued     100 mg Oral Daily 02/07/18 1938 02/07/18 2030   02/08/18 1000  valACYclovir (VALTREX) tablet 500 mg  Status:  Discontinued     500 mg Oral Daily 02/07/18 1938 02/07/18 2040   02/08/18 1000  fluconazole (DIFLUCAN) IVPB 100 mg  100 mg 50 mL/hr over 60 Minutes Intravenous Every 24 hours 02/07/18 2030     02/07/18 1720  meropenem (MERREM) 500 mg in sodium chloride 0.9 % 100 mL IVPB  Status:  Discontinued     500 mg 200 mL/hr over 30 Minutes Intravenous Every 12 hours 02/07/18 1636 02/08/18 1139       Assessment/Plan Lymphoma with thrombocytopenia, neutropenia, and 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 AKI - improved, Cr 0.89 Lactic acidosis Sepsis/Fever - per oncology needs broad spectrum abx until fever subsides and counts improve Protein calorie malnutrition - prealbumin 10 (1/26) Diabetes with hyperglycemia HTN Hypothyroidism Hypokalemia - K 2.6,  replaced by primary  Partial SBO - likely due to adhesions - small bowel protocol started 1/26, delayed film showed persistent small bowel dilatation and minimal contrast within the colon  ID - merrem 1/25>>, diflucan 1/26>> FEN - IVF, clamp NG, sips of clears from the floor VTE - SCDs F[place] - none Follow up - TBD  Plan - Abdominal xray shows decreased small bowel dilation and contrast progressing through colon. Patient has had multiple BMs and is passing some flatus, will clamp NG tube and allow sips of clears from the floor. May be able to d/c NG tube later today. Encourage ambulation/OOB. NG tube output appears blood-tinged, will start PPI.   LOS: 1 day    Wellington Hampshire , [facility] 02/09/2018, 8:17 AM Pager: [phone] Mon-Thurs 7:00 am-4:30 pm Fri 7:00 am -11:30 AM Sat-Sun 7:00 am-11:30 am

## 2018-02-10 LAB — COMPREHENSIVE METABOLIC PANEL
ALT: 24 U/L (ref 0–44)
ANION GAP: 6 (ref 5–15)
AST: 19 U/L (ref 15–41)
Albumin: 2.5 g/dL — ABNORMAL LOW (ref 3.5–5.0)
Alkaline Phosphatase: 93 U/L (ref 38–126)
BUN: 21 mg/dL — ABNORMAL HIGH (ref 6–20)
CO2: 22 mmol/L (ref 22–32)
Calcium: 7.8 mg/dL — ABNORMAL LOW (ref 8.9–10.3)
Chloride: 109 mmol/L (ref 98–111)
Creatinine, Ser: 0.68 mg/dL (ref 0.44–1.00)
GFR calc Af Amer: >60 mL/min (ref >60)
GFR calc non Af Amer: >60 mL/min (ref >60)
Glucose, Bld: 195 mg/dL — ABNORMAL HIGH (ref 70–99)
Potassium: 3.7 mmol/L (ref 3.5–5.1)
Sodium: 137 mmol/L (ref 135–145)
Total Bilirubin: 1.2 mg/dL (ref 0.3–1.2)
Total Protein: 4.8 g/dL — ABNORMAL LOW (ref 6.5–8.1)

## 2018-02-10 LAB — CBC
HCT: 24.7 % — ABNORMAL LOW (ref 36.0–46.0)
HEMOGLOBIN: 8.2 g/dL — AB (ref 12.0–15.0)
MCH: 29.6 pg (ref 26.0–34.0)
MCHC: 33.2 g/dL (ref 30.0–36.0)
MCV: 89.2 fL (ref 80.0–100.0)
Platelets: 14 10*3/uL — CL (ref 150–400)
RBC: 2.77 MIL/uL — AB (ref 3.87–5.11)
RDW: 14.8 % (ref 11.5–15.5)
WBC: 0.3 10*3/uL — CL (ref 4.0–10.5)
nRBC: 0 % (ref 0.0–0.2)

## 2018-02-10 LAB — GLUCOSE, CAPILLARY
Glucose-Capillary: 141 mg/dL — ABNORMAL HIGH (ref 70–99)
Glucose-Capillary: 147 mg/dL — ABNORMAL HIGH (ref 70–99)
Glucose-Capillary: 104 mg/dL — ABNORMAL HIGH (ref 70–99)

## 2018-02-10 LAB — POTASSIUM: Potassium: 3.7 mmol/L (ref 3.5–5.1)

## 2018-02-10 MED ORDER — PANTOPRAZOLE SODIUM 40 MG PO TBEC
40.0000 mg | DELAYED_RELEASE_TABLET | Freq: Every day | ORAL | Status: DC
  Administered 2018-02-10: 40 mg via ORAL
  Filled 2018-02-10: qty 1

## 2018-02-10 MED ORDER — CIPROFLOXACIN HCL 500 MG PO TABS: 500.0000 mg | ORAL_TABLET | Freq: Two times a day (BID) | ORAL | Status: DC

## 2018-02-10 MED ORDER — FLUCONAZOLE 100 MG PO TABS: 100.0000 mg | ORAL_TABLET | Freq: Every day | ORAL | Status: DC

## 2018-02-10 MED ORDER — VALACYCLOVIR HCL 500 MG PO TABS
500.0000 mg | ORAL_TABLET | Freq: Every day | ORAL | Status: DC
  Administered 2018-02-10: 500 mg via ORAL
  Filled 2018-02-10: qty 1

## 2018-02-10 NOTE — Discharge Summary (Signed)
Physician Discharge Summary  DYNASTEE BRUMMELL IWP:[id number] DOB: [birth year] DOA: 02/07/2018  PCP: Marinda Elk, MD  Admit date: 02/07/2018 Discharge date: 02/10/2018  Admitted From: Home Disposition: Home  Recommendations for Outpatient Follow-up:  1. Follow up with PCP and oncology in 1-2 weeks 2. Please obtain BMP/CBC in one week 3. Please follow up on the following pending results:  Home Health: None Equipment/Devices: None  Discharge Condition: Stable CODE STATUS: Full code Diet recommendation: Regular  HPI: Per Dr. Jessie Foot is a 58 y.o. female with medical history significant for recent diagnosis of T-cell lymphoma, HTN, IBS, who presented to ED with complaints of 4-5 episodes of vomiting since last night, also with intermittent generalized sharp abdominal pain.  She reports loose stools but not watery twice yesterday and twice today.  No fever or chills at home.  No burning with urination.  No difficulty breathing, dry cough over the.  No headache or neck stiffness or pain.  Patient was started on prophylactic ciprofloxacin, valacyclovir, and Diflucan, which she is compliant with.  ED Course: Temperature 100.6.  Hypotension-71/56. Tachycardic to 130.  Markedly low WBC 0.3 and platelets 8. Hgb 9.9. Lactic acid 3.0.  Sodium low 131.  Creatinine therapeutic 1.7.  Portable chest x-ray negative for acute abnormality.  UA pending.  ED provider talked with oncologist on-call Dr. Lindi Adie, who recommended admission to hospitalist service did not feel strongly either way about admission here or transfer, transfuse platelets, give Granix, broad-spectrum antibiotics.   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   In [facility]-ED, hypotensive, tachycardic,  febrile, leukopenic and thrombocytopenic.  Lactic acid 3.0.  Creatinine 1.7 (baseline 0.9). Chest x-ray without acute finding.  CT abdomen with small bowel obstruction.  Urinalysis not impressive. Per admitting provider, oncology, Dr. Lindi Adie consulted and recommended platelet transfusion, Granix and broad-spectrum antibiotic. Received 1 unit of platelets overnight. Patient was resuscitated with IV fluid.  Cultures obtained.  Started on broad-spectrum antibiotics and admitted to [facility] hospital.  With all that, tachycardia, hypotension and lactic acidosis resolved. Thrombocytopenia improved.  However, she had worsening leukopenia and anemia.  Dr. Lindi Adie reconsulted 1/26 formally and suggested 2 units of blood, continuing meropenem and Granix 300 mg daily.  Received 2 units of blood with appropriate response.  Granix discontinued on 1/27 as patient has already received Neulasta prior to transfer from [facility].  Patient's fever and sepsis physiology resolved.  She remained afebrile for over 48 hours. Meropenum discontinued and she was cleared from oncology stand point. Patient was started back on home prophylactic antibiotic including Cipro, Diflucan and valacyclovir.  In regards to SBO, general surgery consulted and managed conservatively.  SBO resolved.  See individual problem list below for more.  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  Unclear source of infection but suspect GI source based on her presentation.  Blood and urine cultures negative.  MRSA PCR negative.  Last fever on 1/25, greater than 48 hours.  Still leukopenic but slowly improving. Oncology cleared patient. -Meropenem  1/25-1/28 -Resumed home Cipro, Diflucan and valacyclovir -Discharge home to follow up with her oncologist and pcp.  Pancytopenia/T-cell lymphoma of the spleen: followed by Dr. Delton Coombes in [place].  Received Neulasta on 1/25.  Thrombocytopenia improved after platelet transfusion and remained stable.  Hemoglobin responded appropriately after 2 units on 1/26.  Leukopenia slowly improving.  -No further platelet transfusion unless notable bleeding per oncology. -Granix discontinued by Onc as patient has already received Neulasta.  SBO: Noted on CT abdomen and DG abdomen.  Resolved.  Tolerating diet and having regular bowel movements. -General surgery signed off.  AKI: Resolved.  -We will monitor as needed  Hypokalemia: Resolved.  Essential hypertension: normotensive -Discharged on home meds  Hypothyroidism: -Continue home Synthroid  Prediabetes: Last A1c 5.7 this months.  On metformin at home.   Discharge Instructions  Discharge Instructions    Call MD for:  difficulty breathing, headache or visual disturbances   Complete by:  As directed    Call MD for:  extreme fatigue   Complete by:  As directed    Call MD for:  persistant dizziness or light-headedness   Complete by:  As directed    Call MD for:  persistant nausea and vomiting   Complete by:  As directed    Call MD for:  redness, tenderness, or signs of infection (pain, swelling, redness, odor or green/yellow discharge around incision site)   Complete by:  As directed    Call MD for:  severe uncontrolled pain   Complete by:  As directed    Call MD for:  temperature >100.4   Complete by:  As directed    Diet general   Complete by:  As directed    Discharge instructions   Complete by:  As directed    It has been a pleasure taking care of you! You were admitted with nausea, vomiting, fever and a small bowel obstruction.  Your symptoms improved with the treatments were given.  The test we have done did not  bacterial infection.  You could have viral illness that led to his symptoms.  At this point, your symptoms resolved to the point we think it is safe to let you go home and follow-up with your oncologist and primary care doctor.  Please to make appointment possible.  Once you are discharged, your primary care physician will handle any further medical issues. Please note that NO REFILLS for any discharge medications will be authorized once you are discharged, as it is imperative that you return to your primary care physician (or establish a relationship with a primary care physician if you do not have one) for your aftercare needs so that they can reassess your need for medications and monitor your lab values. Take care,   Increase activity slowly   Complete by:  As directed      Allergies as of 02/10/2018      Reactions   Doxycycline Anaphylaxis   Penicillins Anaphylaxis   Did it involve swelling of the face/tongue/throat, SOB, or low BP? Yes-SOB Did it involve sudden or severe rash/hives, skin peeling, or any reaction on the inside of your mouth or nose? Unknown Did you need to seek medical attention at a hospital or doctor's office? Yes When did it last happen?Over 10 years ago If all above answers are "NO", may proceed with cephalosporin use.   Levofloxacin Nausea And Vomiting   Sulfa Antibiotics Itching, Rash      Medication List    STOP taking these medications  insulin lispro 100 UNIT/ML injection Commonly known as:  HUMALOG     TAKE these medications   ADRIAMYCIN IV Inject 84 mg into the vein every 21 ( twenty-one) days.   atorvastatin 20 MG tablet Commonly known as:  LIPITOR Take 1 tablet by mouth every morning.   CALCIUM 500+D HIGH POTENCY 500-400 MG-UNIT tablet Generic drug:  calcium-vitamin D Take 1 tablet by mouth 2 (two) times daily.   ciprofloxacin 500 MG tablet Commonly known as:  CIPRO Take 1 tablet (500 mg total) by mouth 2 (two) times daily.     cyclophosphamide in sodium chloride 0.9 % 250 mL Inject 1,260 mg into the vein every 21 ( twenty-one) days.   ETOPOSIDE IV Inject 170 mg into the vein every 21 ( twenty-one) days. Days 1-3   fluconazole 100 MG tablet Commonly known as:  DIFLUCAN Take 1 tablet (100 mg total) by mouth daily.   furosemide 20 MG tablet Commonly known as:  LASIX Take 1 tablet (20 mg total) by mouth daily as needed. What changed:  reasons to take this   GLUCOSAMINE 1500 COMPLEX PO Take 1 capsule by mouth 2 (two) times daily.   levothyroxine 75 MCG tablet Commonly known as:  SYNTHROID, LEVOTHROID Take 75 mcg by mouth daily before breakfast.   metFORMIN 1000 MG tablet Commonly known as:  GLUCOPHAGE Take 1 tablet by mouth 2 (two) times daily.   NEULASTA 6 MG/0.6ML injection Generic drug:  pegfilgrastim Inject 6 mg into the skin See admin instructions. To be administered 3 days after chemo therapy treatment   predniSONE 20 MG tablet Commonly known as:  DELTASONE Take 5 tablets (100 mg total) by mouth daily. Take on days 1-5 of chemotherapy.   prochlorperazine 10 MG tablet Commonly known as:  COMPAZINE Take 1 tablet (10 mg total) by mouth every 6 (six) hours as needed for nausea or vomiting.   triamterene-hydrochlorothiazide 37.5-25 MG tablet Commonly known as:  MAXZIDE-25 Take 1 tablet by mouth daily.   valACYclovir 500 MG tablet Commonly known as:  VALTREX Take 1 tablet (500 mg total) by mouth daily.   vinCRIStine 2 mg in sodium chloride 0.9 % 50 mL Inject 2 mg into the vein every 21 ( twenty-one) days.   vitamin B-12 1000 MCG tablet Commonly known as:  CYANOCOBALAMIN Take 1 tablet (1,000 mcg total) by mouth daily.   vitamin C 500 MG tablet Commonly known as:  ASCORBIC ACID Take 500 mg by mouth 2 (two) times daily.      Follow-up Information    Marinda Elk, MD. Schedule an appointment as soon as possible for a visit in 1 week(s).   Specialty:  Physician  Assistant Contact information: [place] [address] [phone]        Derek Jack, MD. Schedule an appointment as soon as possible for a visit in 1 week(s).   Specialty:  Hematology Contact information: [address] [place] [address] [phone]           Consultations:  Oncology  Procedures/Studies:  2D echo -none this admit  Ct Abdomen Pelvis Wo Contrast  Result Date: 02/07/2018 CLINICAL DATA:  History of T-cell lymphoma with recent vomiting EXAM: CT ABDOMEN AND PELVIS WITHOUT CONTRAST TECHNIQUE: Multidetector CT imaging of the abdomen and pelvis was performed following the standard protocol without IV contrast. COMPARISON:  PET-CT from 02/02/2018, CT from 01/20/2018 FINDINGS: Lower chest: No acute abnormality. Hepatobiliary: Liver is within normal limits. The gallbladder is  decompressed. Previously seen wall thickening in the gallbladder on CT has resolved in the interval. Small hypodensity is noted in the lateral segment of the left lobe of the liver consistent with a tiny cyst. Pancreas: Unremarkable. No pancreatic ductal dilatation or surrounding inflammatory changes. Spleen: Spleen is enlarged similar to that seen on prior PET-CT although no focal mass is noted. Adrenals/Urinary Tract: Adrenal glands are within normal limits. Kidneys are well visualized bilaterally. Nonobstructing renal calculi are seen bilaterally. The collecting systems and ureters are within normal limits. The bladder is well distended. Stable densities are noted at the base of the bladder stable from previous exams. Stomach/Bowel: Multiple dilated loops of jejunum are identified increased when compared with the prior PET-CT from 4 days previous. Fecalization of small bowel contents is noted within the proximal ileum. Transition zone is noted deep within the pelvis centrally although no focal mass lesion is noted. These changes are likely related to  underlying adhesions. The more distal ileum is within normal limits. The colon shows fecal material within. The appendix is within normal limits. Vascular/Lymphatic: Aortic atherosclerosis. No enlarged abdominal or pelvic lymph nodes. Reproductive: Uterus and bilateral adnexa are unremarkable. Other: No abdominal wall hernia or abnormality. No abdominopelvic ascites. Musculoskeletal: Degenerative changes of lumbar spine are noted. No acute bony abnormality is seen. IMPRESSION: New proximal small bowel obstruction extending from the proximal jejunum into the proximal ileum. A transition zone is noted deep in the pelvis although incompletely evaluated without oral or IV contrast. This is likely related to adhesions given a relatively negative PET-CT from 4 days previous. The remainder of the exam is stable from the recent exams. Electronically Signed   By: Inez Catalina M.D.   On: 02/07/2018 19:19   Ct Chest W Contrast  Addendum Date: 01/20/2018   ADDENDUM REPORT: 01/20/2018 16:48 ADDENDUM: The following addition is made to the IMPRESSION: 7. Nonspecific curvilinear hyperdense material in the periurethral region, new since 2009 CT. Findings could be due to prior procedure. If there is no history of a procedure to explain these findings, a urethral diverticulum with hemorrhagic/proteinaceous material or enhancing mass can not be excluded and further evaluation with a urethral protocol MRI pelvis without and with IV contrast could be considered. These addended results will be called to the ordering clinician or representative by the Radiologist Assistant, and communication documented in the PACS or zVision Dashboard. Electronically Signed   By: Ilona Sorrel M.D.   On: 01/20/2018 16:48   Result Date: 01/20/2018 CLINICAL DATA:  Inpatient. Pancytopenia. Weight loss. Fever. Generalized weakness. Marked splenomegaly on ultrasound. 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 EXAM: CT CHEST, ABDOMEN, AND PELVIS WITH CONTRAST  TECHNIQUE: Multidetector CT imaging of the chest, abdomen and pelvis was performed following the standard protocol during bolus administration of intravenous contrast. CONTRAST:  34mL ISOVUE-300 IOPAMIDOL (ISOVUE-300) INJECTION 61%, 138mL ISOVUE-300 IOPAMIDOL (ISOVUE-300) INJECTION 61% COMPARISON:  01/19/2018 abdominal sonogram. 11/09/2007 CT abdomen/pelvis. FINDINGS: CT CHEST FINDINGS Cardiovascular: Normal heart size. Trace pericardial effusion/thickening. Three-vessel coronary atherosclerosis. Atherosclerotic nonaneurysmal thoracic aorta. Top-normal caliber main pulmonary artery (3.1 cm diameter). No central pulmonary emboli. Mediastinum/Nodes: Small bilateral thyroid nodules, largest 1.3 cm with coarse calcification in the left thyroid lobe. Unremarkable esophagus. No pathologically enlarged axillary, mediastinal or hilar lymph nodes. Lungs/Pleura: No pneumothorax. Small dependent bilateral pleural effusions. Mild compressive atelectasis in the dependent lower lobes bilaterally. No acute consolidative airspace disease, lung masses or significant pulmonary nodules. Musculoskeletal: Heterogeneous low-attenuation throughout the thoracic vertebral marrow without discrete osseous lesions. Mild thoracic spondylosis.  CT ABDOMEN PELVIS FINDINGS Hepatobiliary: Top-normal liver size. No liver surface irregularity. Simple 0.9 cm lateral segment left liver lobe cyst. Otherwise no liver masses. Marked diffuse gallbladder wall thickening. No radiopaque cholelithiasis. No gallbladder distention. Mild diffuse periportal edema. No biliary ductal dilatation. Pancreas: Normal, with no mass or duct dilation. Spleen: Marked splenomegaly, increased from 2009. Splenic dimensions 15.4 x 6.5 x 22.6 cm (volume = 1200 cm^3). No splenic masses. Adrenals/Urinary Tract: Normal adrenals. No hydronephrosis. Subcentimeter hypodense renal cortical lesion in the lower left kidney is too small to characterize and requires no follow-up. No  additional renal lesions. Normal bladder. There is nonspecific curvilinear hyperdense material in the periurethral region (series 2/image 118), new since 2009 CT. Stomach/Bowel: Normal non-distended stomach. Normal caliber small bowel with no small bowel wall thickening. Normal appendix. Normal large bowel with no diverticulosis, large bowel wall thickening or pericolonic fat stranding. Oral contrast transits to the left colon. Vascular/Lymphatic: Atherosclerotic nonaneurysmal abdominal aorta. Patent portal, splenic, hepatic and renal veins. No pathologically enlarged lymph nodes in the abdomen or pelvis. Reproductive: Mildly enlarged heterogeneous anteverted uterus, similar to the 2009 CT. No adnexal masses. Other: No pneumoperitoneum, ascites or focal fluid collection. Musculoskeletal: Heterogeneous low-attenuation throughout the lumbar vertebral marrow without discrete osseous lesions. IMPRESSION: 1. Marked splenomegaly. Heterogeneous low-attenuation throughout the thoracolumbar vertebral marrow without discrete osseous lesions. These nonspecific findings raise concern for a lymphoproliferative disorder with infiltrative marrow process. 2. No lymphadenopathy in the chest, abdomen or pelvis. 3. Small dependent bilateral pleural effusions. 4. Prominent diffuse gallbladder wall thickening and mild diffuse periportal edema. These findings are most compatible with noninflammatory edema, probably due to the patient's hypoalbuminemia. 5. Three-vessel coronary atherosclerosis. 6.  Aortic Atherosclerosis (ICD10-I70.0). Electronically Signed: By: Ilona Sorrel M.D. On: 01/20/2018 16:20   US Abdomen Complete  Result Date: 01/19/2018 CLINICAL DATA:  Acute kidney injury. EXAM: ABDOMEN ULTRASOUND COMPLETE COMPARISON:  10/26/9 FINDINGS: Gallbladder: The gallbladder wall is diffusely thickened measuring 13.4 mm in thickness. No gallstones or pericholecystic fluid. Negative sonographic Murphy's sign. Common bile duct: Diameter:  4.4 mm. Liver: No focal lesion identified. There is increased echogenicity of the portal triads diffusely. Portal vein is patent on color Doppler imaging with normal direction of blood flow towards the liver. IVC: No abnormality visualized. Pancreas: Visualized portion unremarkable. Spleen: 20.1 cm in length with a volume of 1905 cc Right Kidney: Length: 11.8 cm. Echogenicity within normal limits. No mass or hydronephrosis visualized. Left Kidney: Length: 11.8 cm. Echogenicity within normal limits. No mass or hydronephrosis visualized. Abdominal aorta: No aneurysm visualized. Other findings: None. IMPRESSION: 1. There is marked splenomegaly. 2. Diffuse increased echogenicity of the portal triads which may be seen with liver inflammation. The 3. Marked diffuse gallbladder wall thickening. No gallstones, gallbladder sludge or sonographic Murphy's sign. This is a nonspecific finding and although may be seen with cholecystitis if there is a history of portal venous hypertension due to cirrhosis or fluid overload this could result in edematous appearing gallbladder. Clinical correlation is advised. 4. No hydronephrosis identified bilaterally. Electronically Signed   By: Kerby Moors M.D.   On: 01/19/2018 14:26   Ct Abdomen Pelvis W Contrast  Addendum Date: 01/20/2018   ADDENDUM REPORT: 01/20/2018 16:48 ADDENDUM: The following addition is made to the IMPRESSION: 7. Nonspecific curvilinear hyperdense material in the periurethral region, new since 2009 CT. Findings could be due to prior procedure. If there is no history of a procedure to explain these findings, a urethral diverticulum with hemorrhagic/proteinaceous material or enhancing mass can  not be excluded and further evaluation with a urethral protocol MRI pelvis without and with IV contrast could be considered. These addended results will be called to the ordering clinician or representative by the Radiologist Assistant, and communication documented in the PACS  or zVision Dashboard. Electronically Signed   By: Ilona Sorrel M.D.   On: 01/20/2018 16:48   Result Date: 01/20/2018 CLINICAL DATA:  Inpatient. Pancytopenia. Weight loss. Fever. Generalized weakness. Marked splenomegaly on ultrasound. 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 EXAM: CT CHEST, ABDOMEN, AND PELVIS WITH CONTRAST TECHNIQUE: Multidetector CT imaging of the chest, abdomen and pelvis was performed following the standard protocol during bolus administration of intravenous contrast. CONTRAST:  45mL ISOVUE-300 IOPAMIDOL (ISOVUE-300) INJECTION 61%, 148mL ISOVUE-300 IOPAMIDOL (ISOVUE-300) INJECTION 61% COMPARISON:  01/19/2018 abdominal sonogram. 11/09/2007 CT abdomen/pelvis. FINDINGS: CT CHEST FINDINGS Cardiovascular: Normal heart size. Trace pericardial effusion/thickening. Three-vessel coronary atherosclerosis. Atherosclerotic nonaneurysmal thoracic aorta. Top-normal caliber main pulmonary artery (3.1 cm diameter). No central pulmonary emboli. Mediastinum/Nodes: Small bilateral thyroid nodules, largest 1.3 cm with coarse calcification in the left thyroid lobe. Unremarkable esophagus. No pathologically enlarged axillary, mediastinal or hilar lymph nodes. Lungs/Pleura: No pneumothorax. Small dependent bilateral pleural effusions. Mild compressive atelectasis in the dependent lower lobes bilaterally. No acute consolidative airspace disease, lung masses or significant pulmonary nodules. Musculoskeletal: Heterogeneous low-attenuation throughout the thoracic vertebral marrow without discrete osseous lesions. Mild thoracic spondylosis. CT ABDOMEN PELVIS FINDINGS Hepatobiliary: Top-normal liver size. No liver surface irregularity. Simple 0.9 cm lateral segment left liver lobe cyst. Otherwise no liver masses. Marked diffuse gallbladder wall thickening. No radiopaque cholelithiasis. No gallbladder distention. Mild diffuse periportal edema. No biliary ductal dilatation. Pancreas: Normal, with no mass or duct dilation.  Spleen: Marked splenomegaly, increased from 2009. Splenic dimensions 15.4 x 6.5 x 22.6 cm (volume = 1200 cm^3). No splenic masses. Adrenals/Urinary Tract: Normal adrenals. No hydronephrosis. Subcentimeter hypodense renal cortical lesion in the lower left kidney is too small to characterize and requires no follow-up. No additional renal lesions. Normal bladder. There is nonspecific curvilinear hyperdense material in the periurethral region (series 2/image 118), new since 2009 CT. Stomach/Bowel: Normal non-distended stomach. Normal caliber small bowel with no small bowel wall thickening. Normal appendix. Normal large bowel with no diverticulosis, large bowel wall thickening or pericolonic fat stranding. Oral contrast transits to the left colon. Vascular/Lymphatic: Atherosclerotic nonaneurysmal abdominal aorta. Patent portal, splenic, hepatic and renal veins. No pathologically enlarged lymph nodes in the abdomen or pelvis. Reproductive: Mildly enlarged heterogeneous anteverted uterus, similar to the 2009 CT. No adnexal masses. Other: No pneumoperitoneum, ascites or focal fluid collection. Musculoskeletal: Heterogeneous low-attenuation throughout the lumbar vertebral marrow without discrete osseous lesions. IMPRESSION: 1. Marked splenomegaly. Heterogeneous low-attenuation throughout the thoracolumbar vertebral marrow without discrete osseous lesions. These nonspecific findings raise concern for a lymphoproliferative disorder with infiltrative marrow process. 2. No lymphadenopathy in the chest, abdomen or pelvis. 3. Small dependent bilateral pleural effusions. 4. Prominent diffuse gallbladder wall thickening and mild diffuse periportal edema. These findings are most compatible with noninflammatory edema, probably due to the patient's hypoalbuminemia. 5. Three-vessel coronary atherosclerosis. 6.  Aortic Atherosclerosis (ICD10-I70.0). Electronically Signed: By: Ilona Sorrel M.D. On: 01/20/2018 16:20   Nm Pet Image  Initial (pi) Skull Base To Thigh  Result Date: 02/02/2018 CLINICAL DATA:  Initial treatment strategy for T-cell lymphoma of the spleen. EXAM: NUCLEAR MEDICINE PET SKULL BASE TO THIGH TECHNIQUE: 6.62 mCi F-18 FDG was injected intravenously. Full-ring PET imaging was performed from the skull base to thigh after the radiotracer. CT data was obtained  and used for attenuation correction and anatomic localization. Fasting blood glucose: 94 mg/dl COMPARISON:  CT scan 01/20/2018 FINDINGS: Mediastinal blood pool activity: SUV max 1.85 NECK: No hypermetabolic lymph nodes in the neck. Incidental CT findings: Multinodular thyroid goiter noted. CHEST: No hypermetabolic mediastinal or hilar nodes. No suspicious pulmonary nodules on the CT scan. Incidental CT findings: Three-vessel coronary artery calcifications are noted. Right-sided PICC line in good position without complicating features. Aortic calcifications but no aneurysm. ABDOMEN/PELVIS: No abnormal hypermetabolic activity within the liver, pancreas, adrenal glands, or spleen. No hypermetabolic lymph nodes in the abdomen or pelvis. Splenomegaly but no focal splenic lesions or areas of hypermetabolism. The spleen is self is not hypermetabolic. Incidental CT findings: Mild gallbladder distension. Moderate atherosclerotic calcifications involving the aorta. Enlarged fibroid uterus. SKELETON: No focal hypermetabolic activity to suggest skeletal metastasis. Incidental CT findings: none IMPRESSION: 1. Splenomegaly but the spleen is not hypermetabolic and there are no focal hypermetabolic lesions. 2. No enlarged or hypermetabolic lymphadenopathy in the neck, chest, abdomen or pelvis. Electronically Signed   By: Marijo Sanes M.D.   On: 02/02/2018 14:00   Dg Chest Port 1 View  Result Date: 02/07/2018 CLINICAL DATA:  Emesis, cough, history of lymphoma EXAM: PORTABLE CHEST 1 VIEW COMPARISON:  02/02/2018 FINDINGS: Right-sided PICC line is noted in satisfactory position. Cardiac  shadow is within normal limits. The lungs are well aerated without focal infiltrate. No bony abnormality is seen. IMPRESSION: No acute abnormality noted. Electronically Signed   By: Inez Catalina M.D.   On: 02/07/2018 15:58   Dg Chest Port 1 View  Result Date: 01/23/2018 CLINICAL DATA:  PICC line EXAM: PORTABLE CHEST 1 VIEW COMPARISON:  01/18/2017 FINDINGS: Right upper extremity catheter tip projects over the cavoatrial region. There is a small left-sided pleural effusion. No focal consolidation. Normal heart size. No pneumothorax. IMPRESSION: 1. Right upper extremity catheter tip over the cavoatrial region 2. Small left effusion Electronically Signed   By: Donavan Foil M.D.   On: 01/23/2018 20:57   Dg Chest Port 1 View  Result Date: 01/18/2018 CLINICAL DATA:  58 y/o  F; cough, shortness of breath, fever. EXAM: PORTABLE CHEST 1 VIEW COMPARISON:  None. FINDINGS: The heart size and mediastinal contours are within normal limits. Both lungs are clear. The visualized skeletal structures are unremarkable. IMPRESSION: No active disease. Electronically Signed   By: Kristine Garbe M.D.   On: 01/18/2018 13:06   Dg Abd Portable 1v  Result Date: 02/09/2018 CLINICAL DATA:  Small bowel obstruction.  Abdominal pain. EXAM: PORTABLE ABDOMEN - 1 VIEW COMPARISON:  One-view abdomen 02/08/2018 FINDINGS: Small bowel dilation is improving. There is no residual obstruction. Contrast is progressing through the colon. IMPRESSION: Resolving small bowel obstruction. Electronically Signed   By: San Morelle M.D.   On: 02/09/2018 08:13   Dg Abd Portable 1v-small Bowel Obstruction Protocol-initial, 8 Hr Delay  Result Date: 02/08/2018 CLINICAL DATA:  8 hour follow-up small-bowel obstruction EXAM: PORTABLE ABDOMEN - 1 VIEW COMPARISON:  02/08/2018 FINDINGS: Nasogastric catheter is again noted within the stomach. Contrast material administered previously now lies predominately within the small bowel which remains  dilated. Some mild proximal colonic contrast is present. IMPRESSION: Persistent small bowel dilatation. Minimal contrast is noted within the proximal colon. Electronically Signed   By: Inez Catalina M.D.   On: 02/08/2018 18:11   Dg Abd Portable 1v-small Bowel Protocol-position Verification  Result Date: 02/08/2018 CLINICAL DATA:  NG tube placement. EXAM: PORTABLE ABDOMEN - 1 VIEW COMPARISON:  CT abdomen dated  02/07/2018. FINDINGS: Nasogastric tube appears adequately positioned in the stomach. Distended gas-filled loops of small bowel are seen within the central abdomen and upper pelvis, corresponding to the appearance on yesterday's abdomen CT. IMPRESSION: Nasogastric tube appears adequately positioned in the stomach. Continued evidence of small bowel obstruction. Electronically Signed   By: Franki Cabot M.D.   On: 02/08/2018 08:23      Subjective: No major events overnight.  Denies chest pain, dyspnea, cough, nausea, vomiting, abdominal pain or diarrhea.  No further fever.  Feels ready to go home.  Discharge Exam: Vitals:   02/10/18 0640 02/10/18 1319  BP: 124/76 104/67  Pulse: 84 88  Resp: 14 16  Temp: 99.3 F (37.4 C) 99 F (37.2 C)  SpO2: 100% 100%    GENERAL: Appears well. No acute distress.  HEENT: MMM.  Vision and Hearing grossly intact.  NECK: Supple.  No JVD.  LUNGS:  No IWOB. Good air movement. CTAB.  HEART:  RRR. Heart sounds normal. ABD: Bowel sounds present. Soft. Non tender.  EXT:   no edema bilaterally.  SKIN: no apparent skin lesion.  NEURO: Awake, alert and oriented appropriately.  No gross deficit.  PSYCH: Calm. Normal affect.  The results of significant diagnostics from this hospitalization (including imaging, microbiology, ancillary and laboratory) are listed below for reference.     Microbiology: Recent Results (from the past 240 hour(s))  Culture, blood (Routine x 2)     Status: None (Preliminary result)   Collection Time: 02/07/18  2:44 PM  Result  Value Ref Range Status   Specimen Description BLOOD LEFT ARM  Final   Special Requests   Final    BOTTLES DRAWN AEROBIC AND ANAEROBIC Blood Culture adequate volume   Culture   Final    NO GROWTH 3 DAYS Performed at [facility], [address] [place], [place] [address]    Report Status PENDING  Incomplete  Culture, blood (Routine x 2)     Status: None (Preliminary result)   Collection Time: 02/07/18  2:54 PM  Result Value Ref Range Status   Specimen Description BLOOD LEFT ARM  Final   Special Requests   Final    BOTTLES DRAWN AEROBIC AND ANAEROBIC Blood Culture adequate volume   Culture   Final    NO GROWTH 3 DAYS Performed at [facility], [address], [place], [place] [address]    Report Status PENDING  Incomplete  Culture, body fluid-bottle     Status: None (Preliminary result)   Collection Time: 02/07/18  4:26 PM  Result Value Ref Range Status   Specimen Description FLUID  Final   Special Requests PLATELET PHERESIS [id number] DIV 00  Final   Culture   Final    NO GROWTH 3 DAYS Performed at [facility], [address], [place], [place] [address]    Report Status PENDING  Incomplete  Gram stain     Status: None   Collection Time: 02/07/18  4:26 PM  Result Value Ref Range Status   Specimen Description FLUID  Final   Special Requests PLATELET PHERESIS [id number] DIV 00  Final   Gram Stain   Final    NO ORGANISMS SEEN Gram Stain Report Called to,Read Back By and Verified With: DR. PATRICK AT 2231 ON 02/07/2018 BY EVA GRAM STAIN AND CULTURE PERFORMED BY PROCEDURE [id number] Performed at [facility], [address], [place], [place] [address]    Report Status 02/07/2018 FINAL  Final  Culture, Urine  Status: None   Collection Time: 02/07/18 11:45 PM  Result Value Ref Range Status   Specimen Description   Final    URINE, RANDOM Performed at [facility] [address] [place], [place] [address]    Special Requests   Final     NONE Performed at [facility], [address], [place], [place] [address]    Culture   Final    NO GROWTH Performed at [facility], [place] [address] [place], [place] [address]    Report Status 02/09/2018 FINAL  Final  MRSA PCR Screening     Status: None   Collection Time: 02/08/18  4:24 PM  Result Value Ref Range Status   MRSA by PCR NEGATIVE NEGATIVE Final    Comment:        The GeneXpert MRSA Assay (FDA approved for NASAL specimens only), is one component of a comprehensive MRSA colonization surveillance program. It is not intended to diagnose MRSA infection nor to guide or monitor treatment for MRSA infections. Performed at [facility], [place] [address], [place], [place] [address]      Labs: BNP (last 3 results) Recent Labs    01/18/18 1218  BNP 61.6   Basic Metabolic Panel: Recent Labs  Lab 02/05/18 0914 02/07/18 1445 02/07/18 1626 02/08/18 0728 02/09/18 0627 02/10/18 0829 02/10/18 1546  NA 138 131*  --  139 144 137  --   K 3.9 3.9  --  2.6* 2.6* 3.7 3.7  CL 104 92*  --  110 112* 109  --   CO2 23 24  --  23 23 22   --   GLUCOSE 201* 340*  --  198* 165* 195*  --   BUN 40* 53*  --  42* 31* 21*  --   CREATININE 1.07* 1.74*  --  1.07* 0.89 0.68  --   CALCIUM 8.7* 9.4  --  7.3* 8.1* 7.8*  --   MG  --   --  1.8  --  1.8  --   --    Liver Function Tests: Recent Labs  Lab 02/05/18 0914 02/07/18 1445 02/08/18 0728 02/10/18 0829  AST 16 11* 7* 19  ALT 30 17 12 24   ALKPHOS 149* 124 87 93  BILITOT 1.3* 1.3* 0.8 1.2  PROT 5.5* 6.5 4.8* 4.8*  ALBUMIN 3.5 3.5 2.4* 2.5*   Recent Labs  Lab 02/07/18 1455  LIPASE 19   No results for input(s): AMMONIA in the last 168 hours. CBC: Recent Labs  Lab 02/05/18 0914 02/07/18 1445 02/08/18 0728 02/08/18 2016 02/09/18 0627 02/10/18 0829  WBC 0.4* 0.3* 0.1*  --  0.2* 0.3*  NEUTROABS 0.0* 0.0*  --   --   --   --   HGB 9.8* 9.9* 6.5* 9.0* 8.5* 8.2*  HCT 29.9* 29.8* 19.8* 26.7* 25.7*  24.7*  MCV 89.8 86.9 90.4  --  89.5 89.2  PLT 15* 8* 12*  --  12* 14*   Cardiac Enzymes: Recent Labs  Lab 02/07/18 1455  TROPONINI <0.03   BNP: Invalid input(s): POCBNP CBG: Recent Labs  Lab 02/09/18 1155 02/09/18 1648 02/09/18 2335 02/10/18 0734 02/10/18 1217  GLUCAP 116* 164* 161* 141* 147*   D-Dimer No results for input(s): DDIMER in the last 72 hours. Hgb A1c No results for input(s): HGBA1C in the last 72 hours. Lipid Profile No results for input(s): CHOL, HDL, LDLCALC, TRIG, CHOLHDL, LDLDIRECT in the last 72 hours. Thyroid function studies No results for  input(s): TSH, T4TOTAL, T3FREE, THYROIDAB in the last 72 hours.  Invalid input(s): FREET3 Anemia work up No results for input(s): VITAMINB12, FOLATE, FERRITIN, TIBC, IRON, RETICCTPCT in the last 72 hours. Urinalysis    Component Value Date/Time   COLORURINE YELLOW 02/07/2018 2345   APPEARANCEUR HAZY (A) 02/07/2018 2345   LABSPEC 1.015 02/07/2018 2345   PHURINE 6.0 02/07/2018 2345   GLUCOSEU NEGATIVE 02/07/2018 2345   HGBUR SMALL (A) 02/07/2018 2345   BILIRUBINUR NEGATIVE 02/07/2018 2345   [place] 02/07/2018 2345   PROTEINUR 100 (A) 02/07/2018 2345   NITRITE NEGATIVE 02/07/2018 2345   LEUKOCYTESUR NEGATIVE 02/07/2018 2345   Sepsis Labs Invalid input(s): PROCALCITONIN,  WBC,  LACTICIDVEN   Time coordinating discharge: 45 minutes  SIGNED:  Mercy Riding, MD  [facility] 02/10/2018, 5:05 PM Pager [phone]  If 7PM-7AM, please contact night-coverage [email or web address] Password [email or web address]

## 2018-02-10 NOTE — Progress Notes (Signed)
HEMATOLOGY-ONCOLOGY PROGRESS NOTE  SUBJECTIVE: Follow up of neutropenic fever Meropenem was stopped this morning. She has not had any fevers. Patient is requesting if she can go home tonight instead of waiting till tomorrow morning.  OBJECTIVE: REVIEW OF SYSTEMS:   Constitutional: Denies fevers, chills or abnormal weight loss Eyes: Denies blurriness of vision Ears, nose, mouth, throat, and face: Denies mucositis or sore throat Respiratory: Denies cough, dyspnea or wheezes Cardiovascular: Denies palpitation, chest discomfort Gastrointestinal:  Denies nausea, heartburn or change in bowel habits Skin: Denies abnormal skin rashes Lymphatics: Denies new lymphadenopathy or easy bruising Neurological:Denies numbness, tingling or new weaknesses Behavioral/Psych: Mood is stable, no new changes  Extremities: No lower extremity edema   I have reviewed the past medical history, past surgical history, social history and family history with the patient and they are unchanged from previous note.   PHYSICAL EXAMINATION: ECOG PERFORMANCE STATUS: 2 - Symptomatic, <50% confined to bed  Vitals:   02/10/18 0640 02/10/18 1319  BP: 124/76 104/67  Pulse: 84 88  Resp: 14 16  Temp: 99.3 F (37.4 C) 99 F (37.2 C)  SpO2: 100% 100%   Filed Weights   02/07/18 1416  Weight: 115 lb (52.2 kg)    GENERAL:alert, no distress and comfortable SKIN: skin color, texture, turgor are normal, no rashes or significant lesions EYES: normal, Conjunctiva are pink and non-injected, sclera clear OROPHARYNX:no exudate, no erythema and lips, buccal mucosa, and tongue normal  NECK: supple, thyroid normal size, non-tender, without nodularity LYMPH:  no palpable lymphadenopathy in the cervical, axillary or inguinal LUNGS: clear to auscultation and percussion with normal breathing effort HEART: regular rate & rhythm and no murmurs and no lower extremity edema ABDOMEN:abdomen soft, non-tender and normal bowel  sounds Musculoskeletal:no cyanosis of digits and no clubbing  NEURO: alert & oriented x 3 with fluent speech, no focal motor/sensory deficits  LABORATORY DATA:  I have reviewed the data as listed CMP Latest Ref Rng & Units 02/10/2018 02/10/2018 02/09/2018  Glucose 70 - 99 mg/dL - 195(H) 165(H)  BUN 6 - 20 mg/dL - 21(H) 31(H)  Creatinine 0.44 - 1.00 mg/dL - 0.68 0.89  Sodium 135 - 145 mmol/L - 137 144  Potassium 3.5 - 5.1 mmol/L 3.7 3.7 2.6(LL)  Chloride 98 - 111 mmol/L - 109 112(H)  CO2 22 - 32 mmol/L - 22 23  Calcium 8.9 - 10.3 mg/dL - 7.8(L) 8.1(L)  Total Protein 6.5 - 8.1 g/dL - 4.8(L) -  Total Bilirubin 0.3 - 1.2 mg/dL - 1.2 -  Alkaline Phos 38 - 126 U/L - 93 -  AST 15 - 41 U/L - 19 -  ALT 0 - 44 U/L - 24 -    Lab Results  Component Value Date   WBC 0.3 (LL) 02/10/2018   HGB 8.2 (L) 02/10/2018   HCT 24.7 (L) 02/10/2018   MCV 89.2 02/10/2018   PLT 14 (LL) 02/10/2018   NEUTROABS 0.0 (L) 02/07/2018    ASSESSMENT AND PLAN: Neutropenic fever: Even though patient is neutropenic she is no longer febrile. The neutropenia is primarily disease related. I reviewed her blood counts with the patient and she does not need any platelet transfusions or blood transfusions at this time. Patient gets checked for labs and follow-up at [facility]. Dr. Delton Coombes will follow the patient and check her labs and determine further treatment plan.  From hematology standpoint she is okay to go home tonight. Small bowel obstruction has resolved and no further surgical issues.

## 2018-02-10 NOTE — Progress Notes (Signed)
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   In ED, hypotensive, tachycardic, febrile, leukopenic and thrombocytopenic.  Lactic acid 3.0.  Creatinine 1.7 (baseline 0.9).  Chest x-ray without acute finding.  CT abdomen with small bowel obstruction.  Urinalysis not impressive. Per admitting provider, oncology, Dr. Lindi Adie consulted and recommended platelet transfusion, Granix and broad-spectrum antibiotic.  Dr. Lindi Adie did not feel strongly about transfer to [facility] long.  Patient was resuscitated with IV fluid.  Cultures obtained.  Started on broad-spectrum antibiotics and admitted.  Received 1 unit of platelets overnight.  With all that, tachycardia, hypotension and lactic acidosis resolved.  Thrombocytopenia improved.  However, she had worsening leukopenia and anemia.  Dr. Lindi Adie consulted 1/26 formally and suggested 2 units of blood, continuing meropenem and Granix 300 mg daily.  Received 2 units of blood with appropriate response.  Granix discontinued on 1/27 as patient has already received Neulasta prior to transfer from [facility].  Patient's fever and sepsis physiology resolved.  Oncology recommended observation for 24 hours after discontinuing meropenem.  Last dose on meropenem on the morning of 1/28. Patient was started back on home prophylactic antibiotic including Cipro, Diflucan and valacyclovir.  In regards to SBO, general surgery consulted and managed conservatively.  SBO resolved.  See individual problem list below for more.  Subjective: No major events overnight or this morning.  NG tube removed yesterday.  Tolerated solid food.  Having  normal bowel movements.  Denies nausea, vomiting or diarrhea.  Denies chest pain, dyspnea or fever.  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  Unclear source of infection but suspect GI source based on her presentation.  Blood and urine cultures negative.  MRSA PCR negative.  Last fever on 1/25.  Still leukopenic but slowly improving. -Meropenem 1/25-1/28 -Resumed home prophylactic antibiotic (Cipro, Diflucan and valacyclovir) on 1/28 -Discharge home if no further fever by 1/29.  Pancytopenia/T-cell lymphoma of the spleen: followed by Dr. Delton Coombes in [place].  Received Neulasta on 1/25.  Thrombocytopenia improved after platelet transfusion and remained stable.  Hemoglobin responded appropriately after 2 units on 1/26.  Leukopenia slowly improving.  -Continue monitoring -No further platelet transfusion unless notable bleeding per oncology. -Granix discontinued by oncology as patient has already received Neulasta.  SBO: Noted on CT abdomen and DG abdomen.  Resolved.  Tolerating diet and having regular bowel movements. -General surgery signed off.  AKI: Resolved.  -We will monitor as needed  Hypokalemia: Resolved.  Essential hypertension: Hypotensive on presentation.  Currently normotensive. -We will resume home antihypertensive meds as appropriate.  Hypothyroidism: -Continue home Synthroid  Prediabetes: Last A1c 5.7 this months.  On metformin at home. -CBG monitoring -Sliding scale insulin -Liberating diet.  Scheduled Meds: . ciprofloxacin  500 mg Oral BID  . [START ON 02/11/2018] fluconazole  100 mg Oral Daily  . insulin aspart  0-9 Units Subcutaneous TID WC  .  magic mouthwash w/lidocaine  5 mL Oral TID  . pantoprazole  40 mg Oral Daily  . valACYclovir  500 mg Oral Daily  . vitamin B-12  1,000 mcg Oral Daily  . vitamin C  500 mg Oral BID   Continuous Infusions:  PRN Meds:.acetaminophen **OR** acetaminophen, ondansetron **OR** ondansetron (ZOFRAN) IV, polyethylene glycol, sodium chloride flush  DVT prophylaxis: SCD Code Status: Full code Family Communication: None at bedside Disposition Plan: Remains inpatient.  Anticipate discharge in the next 24 hours if she remains afebrile.  Consultants:   Oncology  General surgery-signed off  Procedures:   NG tube  Antimicrobials:  Meropenem 1/26-1/28  Home Cipro, Diflucan and valacyclovir resumed on 1/28.  Objective: Vitals:   02/09/18 1600 02/09/18 1803 02/09/18 2338 02/10/18 0640  BP:  126/74 130/75 124/76  Pulse:  84 83 84  Resp:  14 16 14   Temp: 98.4 F (36.9 C) 98.2 F (36.8 C) 98.5 F (36.9 C) 99.3 F (37.4 C)  TempSrc: Oral Oral Oral Oral  SpO2:  100% 99% 100%  Weight:      Height:        Intake/Output Summary (Last 24 hours) at 02/10/2018 1242 Last data filed at 02/10/2018 1000 Gross per 24 hour  Intake 1519.67 ml  Output 450 ml  Net 1069.67 ml   Filed Weights   02/07/18 1416  Weight: 52.2 kg    Examination:  GENERAL: No acute distress.  HEENT: MMM.  Vision and Hearing grossly intact.  NECK: Supple.  No JVD.  LUNGS:  No IWOB. Good air movement. CTAB.  HEART:  RRR.  2/6 SEM over RUSB. ABD: Bowel sounds present. Soft. Non tender.  EXT:   no edema bilaterally.  SKIN: no apparent skin lesion.  NEURO: Awake, alert and oriented appropriately.  No gross deficit.  PSYCH: Calm. Normal affect.   Data Reviewed: I have independently reviewed following labs and imaging studies  CBC: Recent Labs  Lab 02/05/18 0914 02/07/18 1445 02/08/18 0728 02/08/18 2016 02/09/18 0627 02/10/18 0829  WBC 0.4* 0.3* 0.1*  --  0.2* 0.3*  NEUTROABS 0.0* 0.0*  --   --   --   --     HGB 9.8* 9.9* 6.5* 9.0* 8.5* 8.2*  HCT 29.9* 29.8* 19.8* 26.7* 25.7* 24.7*  MCV 89.8 86.9 90.4  --  89.5 89.2  PLT 15* 8* 12*  --  12* 14*   Basic Metabolic Panel: Recent Labs  Lab 02/05/18 0914 02/07/18 1445 02/07/18 1626 02/08/18 0728 02/09/18 0627 02/10/18 0829  NA 138 131*  --  139 144 137  K 3.9 3.9  --  2.6* 2.6* 3.7  CL 104 92*  --  110 112* 109  CO2 23 24  --  23 23 22   GLUCOSE 201* 340*  --  198* 165* 195*  BUN 40* 53*  --  42* 31* 21*  CREATININE 1.07* 1.74*  --  1.07* 0.89 0.68  CALCIUM 8.7* 9.4  --  7.3* 8.1* 7.8*  MG  --   --  1.8  --  1.8  --    GFR: Estimated Creatinine Clearance: 62.4 mL/min (by C-G formula based on SCr of 0.68 mg/dL). Liver Function Tests: Recent Labs  Lab 02/05/18 0914 02/07/18 1445 02/08/18 0728 02/10/18 0829  AST 16 11* 7* 19  ALT 30 17 12 24   ALKPHOS 149* 124 87 93  BILITOT 1.3* 1.3* 0.8 1.2  PROT 5.5* 6.5 4.8* 4.8*  ALBUMIN 3.5 3.5 2.4* 2.5*   Recent Labs  Lab 02/07/18 1455  LIPASE 19   No results for input(s): AMMONIA in the last 168 hours. Coagulation Profile: Recent Labs  Lab 02/07/18 1445  INR 1.14   Cardiac Enzymes: Recent Labs  Lab 02/07/18 1455  TROPONINI <0.03   BNP (last 3 results) No results for input(s): PROBNP in the last 8760 hours. HbA1C: No results for input(s): HGBA1C in the last 72 hours. CBG: Recent Labs  Lab 02/09/18 1155 02/09/18 1648 02/09/18 2335 02/10/18 0734 02/10/18 1217  GLUCAP 116* 164* 161* 141* 147*   Lipid Profile: No results for input(s): CHOL, HDL, LDLCALC, TRIG, CHOLHDL, LDLDIRECT in the last 72 hours. Thyroid Function Tests: No results for input(s): TSH, T4TOTAL, FREET4, T3FREE, THYROIDAB in the last 72 hours. Anemia Panel: No results for input(s): VITAMINB12, FOLATE, FERRITIN, TIBC, IRON, RETICCTPCT in the last 72 hours. Urine analysis:    Component Value Date/Time   COLORURINE YELLOW 02/07/2018 2345   APPEARANCEUR HAZY (A) 02/07/2018 2345   LABSPEC 1.015  02/07/2018 2345   PHURINE 6.0 02/07/2018 2345   GLUCOSEU NEGATIVE 02/07/2018 2345   HGBUR SMALL (A) 02/07/2018 2345   BILIRUBINUR NEGATIVE 02/07/2018 2345   [place] 02/07/2018 2345   PROTEINUR 100 (A) 02/07/2018 2345   NITRITE NEGATIVE 02/07/2018 2345   LEUKOCYTESUR NEGATIVE 02/07/2018 2345   Sepsis Labs: Invalid input(s): PROCALCITONIN, LACTICIDVEN  Recent Results (from the past 240 hour(s))  Culture, blood (Routine x 2)     Status: None (Preliminary result)   Collection Time: 02/07/18  2:44 PM  Result Value Ref Range Status   Specimen Description BLOOD LEFT ARM  Final   Special Requests   Final    BOTTLES DRAWN AEROBIC AND ANAEROBIC Blood Culture adequate volume   Culture   Final    NO GROWTH 3 DAYS Performed at [facility], [address], [place], [place] [address]    Report Status PENDING  Incomplete  Culture, blood (Routine x 2)     Status: None (Preliminary result)   Collection Time: 02/07/18  2:54 PM  Result Value Ref Range Status   Specimen Description BLOOD LEFT ARM  Final   Special Requests   Final    BOTTLES DRAWN AEROBIC AND ANAEROBIC Blood Culture adequate volume   Culture   Final    NO GROWTH 3 DAYS Performed at [facility], [address], [place], [place] [address]    Report Status PENDING  Incomplete  Culture, body fluid-bottle     Status: None (Preliminary result)   Collection Time: 02/07/18  4:26 PM  Result Value Ref Range Status   Specimen Description FLUID  Final   Special Requests PLATELET PHERESIS [id number] DIV 00  Final   Culture   Final    NO GROWTH 3 DAYS Performed at [facility], [address], [place], [place] [address]    Report Status PENDING  Incomplete  Gram stain     Status: None   Collection Time: 02/07/18  4:26 PM  Result Value Ref Range Status   Specimen Description FLUID  Final   Special Requests PLATELET PHERESIS [id number] DIV 00  Final   Gram Stain   Final    NO ORGANISMS SEEN Gram Stain Report  Called to,Read Back By and Verified With: DR. PATRICK AT 2231 ON 02/07/2018 BY EVA GRAM STAIN AND CULTURE PERFORMED BY PROCEDURE [id number] Performed at [facility], [address] [place],  [address]    Report Status 02/07/2018 FINAL  Final  Culture, Urine  Status: None   Collection Time: 02/07/18 11:45 PM  Result Value Ref Range Status   Specimen Description   Final    URINE, RANDOM Performed at [place] [address] [place], [place] [address]    Special Requests   Final    NONE Performed at [facility], [address] [place], [place] [address]    Culture   Final    NO GROWTH Performed at [facility], [place] [address], [place], [place] [address]    Report Status 02/09/2018 FINAL  Final  MRSA PCR Screening     Status: None   Collection Time: 02/08/18  4:24 PM  Result Value Ref Range Status   MRSA by PCR NEGATIVE NEGATIVE Final    Comment:        The GeneXpert MRSA Assay (FDA approved for NASAL specimens only), is one component of a comprehensive MRSA colonization surveillance program. It is not intended to diagnose MRSA infection nor to guide or monitor treatment for MRSA infections. Performed at [facility], [place] [address] [place], [place] [address]       Radiology Studies: No results found.   [facility] Pager [phone]  If 7PM-7AM, please contact night-coverage [email or web address] Password [facility] 02/10/2018, 12:42 PM

## 2018-02-10 NOTE — Final Consult Note (Signed)
[facility] Progress Note     Subjective: CC-  Sitting up in bed eating a bagel. Denies any abdominal pain. Denies n/v. Tolerating diet well. States that she had a couple BMs yesterday, and 1 so far today. Stool was loose, non-bloody.  Objective: Vital signs in last 24 hours: Temp:  [97.5 F (36.4 C)-99.3 F (37.4 C)] 99.3 F (37.4 C) (01/28 0640) Pulse Rate:  [81-92] 84 (01/28 0640) Resp:  [14-19] 14 (01/28 0640) BP: (124-136)/(63-76) 124/76 (01/28 0640) SpO2:  [96 %-100 %] 100 % (01/28 0640) Last BM Date: 02/09/18  Intake/Output from previous day: 01/27 0701 - 01/28 0700 In: 1105.2 [P.O.:480; IV Piggyback:625.2] Out: 450 [Urine:450] Intake/Output this shift: No intake/output data recorded.  PE: Gen:  Alert, NAD, pleasant HEENT: EOM's intact, pupils equal and round Pulm:  effort normal Abd: Soft, NT/ND, + BS, no HSM, no hernia Psych: A&Ox3  Skin: warm and dry  Lab Results:  Recent Labs    02/08/18 0728 02/08/18 2016 02/09/18 0627  WBC 0.1*  --  0.2*  HGB 6.5* 9.0* 8.5*  HCT 19.8* 26.7* 25.7*  PLT 12*  --  12*   BMET Recent Labs    02/08/18 0728 02/09/18 0627  NA 139 144  K 2.6* 2.6*  CL 110 112*  CO2 23 23  GLUCOSE 198* 165*  BUN 42* 31*  CREATININE 1.07* 0.89  CALCIUM 7.3* 8.1*   PT/INR Recent Labs    02/07/18 1445  LABPROT 14.5  INR 1.14   CMP     Component Value Date/Time   NA 144 02/09/2018 0627   K 2.6 (LL) 02/09/2018 0627   CL 112 (H) 02/09/2018 0627   CO2 23 02/09/2018 0627   GLUCOSE 165 (H) 02/09/2018 0627   BUN 31 (H) 02/09/2018 0627   CREATININE 0.89 02/09/2018 0627   CALCIUM 8.1 (L) 02/09/2018 0627   PROT 4.8 (L) 02/08/2018 0728   ALBUMIN 2.4 (L) 02/08/2018 0728   AST 7 (L) 02/08/2018 0728   ALT 12 02/08/2018 0728   ALKPHOS 87 02/08/2018 0728   BILITOT 0.8 02/08/2018 0728   GFRNONAA >60 02/09/2018 0627   GFRAA >60 02/09/2018 0627   Lipase     Component Value Date/Time   LIPASE 19 02/07/2018 1455        Studies/Results: Dg Abd Portable 1v  Result Date: 02/09/2018 CLINICAL DATA:  Small bowel obstruction.  Abdominal pain. EXAM: PORTABLE ABDOMEN - 1 VIEW COMPARISON:  One-view abdomen 02/08/2018 FINDINGS: Small bowel dilation is improving. There is no residual obstruction. Contrast is progressing through the colon. IMPRESSION: Resolving small bowel obstruction. Electronically Signed   By: San Morelle M.D.   On: 02/09/2018 08:13   Dg Abd Portable 1v-small Bowel Obstruction Protocol-initial, 8 Hr Delay  Result Date: 02/08/2018 CLINICAL DATA:  8 hour follow-up small-bowel obstruction EXAM: PORTABLE ABDOMEN - 1 VIEW COMPARISON:  02/08/2018 FINDINGS: Nasogastric catheter is again noted within the stomach. Contrast material administered previously now lies predominately within the small bowel which remains dilated. Some mild proximal colonic contrast is present. IMPRESSION: Persistent small bowel dilatation. Minimal contrast is noted within the proximal colon. Electronically Signed   By: Inez Catalina M.D.   On: 02/08/2018 18:11   Dg Abd Portable 1v-small Bowel Protocol-position Verification  Result Date: 02/08/2018 CLINICAL DATA:  NG tube placement. EXAM: PORTABLE ABDOMEN - 1 VIEW COMPARISON:  CT abdomen dated 02/07/2018. FINDINGS: Nasogastric tube appears adequately positioned in the stomach. Distended gas-filled loops of small bowel are seen within the central  abdomen and upper pelvis, corresponding to the appearance on yesterday's abdomen CT. IMPRESSION: Nasogastric tube appears adequately positioned in the stomach. Continued evidence of small bowel obstruction. Electronically Signed   By: Franki Cabot M.D.   On: 02/08/2018 08:23    Anti-infectives: Anti-infectives (From admission, onward)   Start     Dose/Rate Route Frequency Ordered Stop   02/08/18 1800  meropenem (MERREM) 1 g in sodium chloride 0.9 % 100 mL IVPB     1 g 200 mL/hr over 30 Minutes Intravenous Every 12 hours  02/08/18 1139     02/08/18 1000  fluconazole (DIFLUCAN) tablet 100 mg  Status:  Discontinued     100 mg Oral Daily 02/07/18 1938 02/07/18 2030   02/08/18 1000  valACYclovir (VALTREX) tablet 500 mg  Status:  Discontinued     500 mg Oral Daily 02/07/18 1938 02/07/18 2040   02/08/18 1000  fluconazole (DIFLUCAN) IVPB 100 mg     100 mg 50 mL/hr over 60 Minutes Intravenous Every 24 hours 02/07/18 2030     02/07/18 1720  meropenem (MERREM) 500 mg in sodium chloride 0.9 % 100 mL IVPB  Status:  Discontinued     500 mg 200 mL/hr over 30 Minutes Intravenous Every 12 hours 02/07/18 1636 02/08/18 1139       Assessment/Plan Lymphoma with thrombocytopenia, neutropenia, and 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 AKI  Lactic acidosis Sepsis/Fever - per oncology needs broad spectrum abx until fever subsides and counts improve Protein calorie malnutrition - prealbumin 10 (1/26) Diabetes with hyperglycemia HTN Hypothyroidism Hypokalemia   Partial SBO - likely due to adhesions - small bowel protocol started 1/26, delayed film showed persistent small bowel dilatation and minimal contrast within the colon  ID - merrem 1/25>>, diflucan 1/26>> FEN - CM diet VTE - SCDs, per primary Foley - none Follow up - no surgical follow up  Plan - SBO resolving, patient is having bowel function and tolerating diet. No role for surgical intervention. General surgery will sign off, please call us back with any concerns.    LOS: 2 days    Wellington Hampshire , [facility] 02/10/2018, 7:50 AM Pager: [phone] Mon-Thurs 7:00 am-4:30 pm Fri 7:00 am -11:30 AM Sat-Sun 7:00 am-11:30 am

## 2018-02-12 ENCOUNTER — Other Ambulatory Visit (HOSPITAL_COMMUNITY): Payer: Commercial Managed Care - PPO | Admitting: Hematology

## 2018-02-12 ENCOUNTER — Encounter (HOSPITAL_COMMUNITY): Payer: Self-pay | Admitting: Hematology

## 2018-02-12 ENCOUNTER — Inpatient Hospital Stay (HOSPITAL_BASED_OUTPATIENT_CLINIC_OR_DEPARTMENT_OTHER): Payer: Commercial Managed Care - PPO | Admitting: Hematology

## 2018-02-12 ENCOUNTER — Ambulatory Visit (HOSPITAL_COMMUNITY): Payer: Commercial Managed Care - PPO

## 2018-02-12 ENCOUNTER — Other Ambulatory Visit (HOSPITAL_COMMUNITY): Payer: Commercial Managed Care - PPO

## 2018-02-12 ENCOUNTER — Inpatient Hospital Stay (HOSPITAL_COMMUNITY): Payer: Commercial Managed Care - PPO

## 2018-02-12 VITALS — BP 109/75 | HR 94 | Temp 98.0°F | Resp 18 | Wt 114.0 lb

## 2018-02-12 DIAGNOSIS — Z5111 Encounter for antineoplastic chemotherapy: Secondary | ICD-10-CM | POA: Diagnosis not present

## 2018-02-12 DIAGNOSIS — E876 Hypokalemia: Secondary | ICD-10-CM | POA: Diagnosis not present

## 2018-02-12 DIAGNOSIS — C8447 Peripheral T-cell lymphoma, not classified, spleen: Secondary | ICD-10-CM

## 2018-02-12 LAB — COMPREHENSIVE METABOLIC PANEL
ALT: 30 U/L (ref 0–44)
AST: 19 U/L (ref 15–41)
Albumin: 2.8 g/dL — ABNORMAL LOW (ref 3.5–5.0)
Alkaline Phosphatase: 123 U/L (ref 38–126)
Anion gap: 10 (ref 5–15)
BILIRUBIN TOTAL: 0.7 mg/dL (ref 0.3–1.2)
BUN: 17 mg/dL (ref 6–20)
CO2: 21 mmol/L — ABNORMAL LOW (ref 22–32)
Calcium: 7.9 mg/dL — ABNORMAL LOW (ref 8.9–10.3)
Chloride: 108 mmol/L (ref 98–111)
Creatinine, Ser: 0.76 mg/dL (ref 0.44–1.00)
GFR calc Af Amer: >60 mL/min (ref >60)
GFR calc non Af Amer: >60 mL/min (ref >60)
Glucose, Bld: 145 mg/dL — ABNORMAL HIGH (ref 70–99)
Potassium: 3 mmol/L — ABNORMAL LOW (ref 3.5–5.1)
Sodium: 139 mmol/L (ref 135–145)
Total Protein: 5.4 g/dL — ABNORMAL LOW (ref 6.5–8.1)

## 2018-02-12 LAB — CBC WITH DIFFERENTIAL/PLATELET
Abs Immature Granulocytes: 0.01 10*3/uL (ref 0.00–0.07)
Basophils Absolute: 0 10*3/uL (ref 0.0–0.1)
Basophils Relative: 0 %
Eosinophils Absolute: 0 10*3/uL (ref 0.0–0.5)
Eosinophils Relative: 0 %
HCT: 26.3 % — ABNORMAL LOW (ref 36.0–46.0)
Hemoglobin: 8.7 g/dL — ABNORMAL LOW (ref 12.0–15.0)
Immature Granulocytes: 2 %
Lymphocytes Relative: 45 %
Lymphs Abs: 0.3 10*3/uL — ABNORMAL LOW (ref 0.7–4.0)
MCH: 28.7 pg (ref 26.0–34.0)
MCHC: 33.1 g/dL (ref 30.0–36.0)
MCV: 86.8 fL (ref 80.0–100.0)
Monocytes Absolute: 0.1 10*3/uL (ref 0.1–1.0)
Monocytes Relative: 22 %
Neutro Abs: 0.2 10*3/uL — ABNORMAL LOW (ref 1.7–7.7)
Neutrophils Relative %: 31 %
Platelets: 24 10*3/uL — CL (ref 150–400)
RBC: 3.03 MIL/uL — ABNORMAL LOW (ref 3.87–5.11)
RDW: 14.7 % (ref 11.5–15.5)
WBC: 0.6 10*3/uL — CL (ref 4.0–10.5)
nRBC: 0 % (ref 0.0–0.2)

## 2018-02-12 LAB — CULTURE, BODY FLUID W GRAM STAIN -BOTTLE

## 2018-02-12 LAB — CULTURE, BLOOD (ROUTINE X 2)
Culture: NO GROWTH
Culture: NO GROWTH
Special Requests: ADEQUATE
Special Requests: ADEQUATE

## 2018-02-12 LAB — CULTURE, BODY FLUID-BOTTLE: CULTURE: NO GROWTH

## 2018-02-12 LAB — LACTATE DEHYDROGENASE: LDH: 117 U/L (ref 98–192)

## 2018-02-12 LAB — URIC ACID: Uric Acid, Serum: 4.2 mg/dL (ref 2.5–7.1)

## 2018-02-12 MED ORDER — POTASSIUM CHLORIDE CRYS ER 20 MEQ PO TBCR
40.0000 meq | EXTENDED_RELEASE_TABLET | Freq: Once | ORAL | Status: AC
  Administered 2018-02-12: 40 meq via ORAL
  Filled 2018-02-12: qty 2

## 2018-02-12 MED ORDER — HEPARIN SOD (PORK) LOCK FLUSH 100 UNIT/ML IV SOLN
INTRAVENOUS | Status: AC
  Filled 2018-02-12: qty 5

## 2018-02-12 MED ORDER — POTASSIUM CHLORIDE CRYS ER 20 MEQ PO TBCR: 20.0000 meq | EXTENDED_RELEASE_TABLET | Freq: Every day | ORAL | 2 refills | Status: DC

## 2018-02-12 MED ORDER — SODIUM CHLORIDE 0.9% FLUSH
20.0000 mL | Freq: Once | INTRAVENOUS | Status: AC
  Administered 2018-02-12: 20 mL via INTRAVENOUS

## 2018-02-12 MED ORDER — HEPARIN SOD (PORK) LOCK FLUSH 100 UNIT/ML IV SOLN
250.0000 [IU] | Freq: Once | INTRAVENOUS | Status: AC
  Administered 2018-02-12: 250 [IU] via INTRAVENOUS

## 2018-02-12 NOTE — Assessment & Plan Note (Addendum)
1.  PTCL, NOS: -Presentation with pancytopenia, night sweats and fevers for last 1 to 2 months. - CT CAP shows splenomegaly with no lymphadenopathy. 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  T cells do not express TDT, CD1a, CD10, CD15, CD30, CD34, CD56, CD 117 or CD99.  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 - Prognostic index for PTCL (PIT) has 2 positive factors including serum LDH above normal and bone marrow involvement. - She is not a candidate for brentuximab-based regimen as CD30 was negative. -We talked about first-line therapy with CHOP-based regimen.  I have also talked to her about CHOEP regimen which has shown to be helpful for patients less than 60 and normal LDH. - We will obtain a baseline PET CT scan. - She has a PICC line placed for chemotherapy administration. -2D echo on 01/24/2018 shows ejection fraction of 65 to 70%. - Received first cycle of CHOEP on 01/28/2018, Neulasta on 02/02/2018. - PET/CT scan on 02/02/2018 shows splenomegaly, with no hypermetabolism.  No enlarged or hypermetabolic lymphadenopathy in the neck, chest, abdomen or pelvis. -She met with Dr. Minna Antis at [facility] for consolidation transplantation. -She was admitted to the hospital on 02/07/2018 through 02/10/2018 with small bowel obstruction which resolved with conservative measures.  She did have neutropenic fever and received IV antibiotics.  Since discharge she denies any fevers or chills.  She had one episode of night sweats last night which is mild. - She denied any further vomiting or abdominal pain. -We have reviewed her blood work today.  White count is 0.6, hemoglobin is 8.6 and platelet count is 24. -She will continue her prophylactic antibiotics.  We plan to recheck her labs on 02/16/2018.  Her cycle 2 will be on 02/18/2018.  2.  Hypokalemia: -Potassium was 3.0 today.  She was  repleted in the office. -We will start her on potassium 20 mEq daily.

## 2018-02-12 NOTE — Progress Notes (Signed)
[address], [place] [address]   CLINIC:  Medical Oncology/Hematology  PCP:  Marinda Elk, [place] [phone]   REASON FOR VISIT: Follow-up for Peripheral T cell Lymphoma.  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[facility])    01/28/2018 -  Chemotherapy    The patient had DOXOrubicin (ADRIAMYCIN) chemo injection 50 mg, 30 mg/m2 = 50 mg (60 % of original dose 50 mg/m2), Intravenous,  Once, 1 of 8 cycles Dose modification: 30 mg/m2 (60 % of original dose 50 mg/m2, Cycle 1, Reason: Other (see comments), Comment: elevated bilirubin) Administration: 50 mg (01/28/2018) palonosetron (ALOXI) injection 0.25 mg, 0.25 mg, Intravenous,  Once, 1 of 8 cycles Administration: 0.25 mg (01/28/2018) pegfilgrastim (NEULASTA ONPRO KIT) injection 6 mg, 6 mg, Subcutaneous, Once, 1 of 8 cycles vinCRIStine (ONCOVIN) 1 mg in sodium chloride 0.9 % 50 mL chemo infusion, 1 mg (50 % of original dose 2 mg), Intravenous,  Once, 1 of 8 cycles Dose modification: 1 mg (50 % of original dose 2 mg, Cycle 1, Reason: Other (see comments), Comment: liver dysfunctyion) Administration: 1 mg (01/28/2018) cyclophosphamide (CYTOXAN) 1,260 mg in sodium chloride 0.9 % 250 mL chemo infusion, 750 mg/m2 = 1,260 mg, Intravenous,  Once, 1 of 8 cycles Administration: 1,260 mg (01/28/2018) etoposide (VEPESID) 130 mg in sodium chloride 0.9 % 500 mL chemo infusion, 75 mg/m2 = 130 mg (75 % of original dose 100 mg/m2), Intravenous,  Once, 1 of 8 cycles Dose modification: 75 mg/m2 (75 % of original dose 100 mg/m2, Cycle 1, Reason: Change in LFTs), 75 mg/m2 (75 % of original dose 100 mg/m2, Cycle 1, Reason: Change in LFTs) Administration: 130 mg (01/28/2018), 130 mg (01/29/2018), 130 mg (01/30/2018) fosaprepitant (EMEND)  150 mg, dexamethasone (DECADRON) 12 mg in sodium chloride 0.9 % 145 mL IVPB, , Intravenous,  Once, 1 of 6 cycles Administration:  (01/28/2018)  for chemotherapy treatment.      INTERVAL HISTORY:  Ms. Kristin Good 58 y.o. female returns for routine follow-up for Peripheral T cell Lymphoma. She is here with her husband. She feels better since being discharged from the hospital after her small bowel obstruction. She has occasional night sweats. Denies any nausea, vomiting, or diarrhea. Denies any new pains. Had not noticed any recent bleeding such as epistaxis, hematuria or hematochezia. Denies recent chest pain on exertion, shortness of breath on minimal exertion, pre-syncopal episodes, or palpitations. Denies any numbness or tingling in hands or feet. Denies any recent fevers. Patient reports appetite at 100% and energy level at 50%.    REVIEW OF SYSTEMS:  Review of Systems  Constitutional: Positive for fatigue.  All other systems reviewed and are negative.    PAST MEDICAL/SURGICAL HISTORY:  Past Medical History:  Diagnosis Date  . Diabetes ([facility]) 01/20/2018  . HTN (hypertension)   . Hypothyroidism   . 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 COLONOSCOPY  07/27/2010   Procedure: COLONOSCOPY;  Surgeon: Dorothyann Peng, MD;  Location: [facility];  Service: Endoscopy;  Laterality: N/A;  . infertility surgery    . MOHS SURGERY     basal cell carcinoma  . REDUCTION MAMMAPLASTY Bilateral 2002     SOCIAL HISTORY:  Social History  Socioeconomic History  . Marital status: Married    Spouse name: Not on file  . Number of children: 0  . Years of education: Not on file  . Highest education level: Not on file  Occupational History  . Occupation: [employer]    Comment: Nurse  Social Needs  . Financial resource strain: Not on file  . Food insecurity:    Worry: Not on file    Inability: Not on file  . Transportation  needs:    Medical: Not on file    Non-medical: Not on file  Tobacco Use  . Smoking status: Never Smoker  . Smokeless tobacco: Never Used  Substance and Sexual Activity  . Alcohol use: Yes    Comment: once or twice a year  . Drug use: No  . Sexual activity: Not on file  Lifestyle  . Physical activity:    Days per week: Not on file    Minutes per session: Not on file  . Stress: Not on file  Relationships  . Social connections:    Talks on phone: Not on file    Gets together: Not on file    Attends religious service: Not on file    Active member of club or organization: Not on file    Attends meetings of clubs or organizations: Not on file    Relationship status: Not on file  . Intimate partner violence:    Fear of current or ex partner: Not on file    Emotionally abused: Not on file    Physically abused: Not on file    Forced sexual activity: Not on file  Other Topics Concern  . Not on file  Social History Narrative  . Not on file    FAMILY HISTORY:  Family History  Problem Relation Age of Onset  . Heart disease Mother   . Diabetes Father   . Breast cancer Sister 42  . Breast cancer Maternal Aunt   . Colon cancer Neg Hx   . Liver disease Neg Hx     CURRENT MEDICATIONS:  Outpatient Encounter Medications as of 02/12/2018  Medication Sig Note  . atorvastatin (LIPITOR) 20 MG tablet Take 1 tablet by mouth every morning.    . calcium-vitamin D (CALCIUM 500+D HIGH POTENCY) 500-400 MG-UNIT tablet Take 1 tablet by mouth 2 (two) times daily.   . ciprofloxacin (CIPRO) 500 MG tablet Take 1 tablet (500 mg total) by mouth 2 (two) times daily. 02/02/2018: Prophylactic   . cyclophosphamide in sodium chloride 0.9 % 250 mL Inject 1,260 mg into the vein every 21 ( twenty-one) days.   Marland Kitchen DOXOrubicin HCl (ADRIAMYCIN IV) Inject 84 mg into the vein every 21 ( twenty-one) days.   . ETOPOSIDE IV Inject 170 mg into the vein every 21 ( twenty-one) days. Days 1-3   . fluconazole (DIFLUCAN) 100  MG tablet Take 1 tablet (100 mg total) by mouth daily.   . furosemide (LASIX) 20 MG tablet Take 1 tablet (20 mg total) by mouth daily as needed. (Patient taking differently: Take 20 mg by mouth daily as needed for fluid. )   . Glucosamine-Chondroit-Vit C-Mn (GLUCOSAMINE 1500 COMPLEX PO) Take 1 capsule by mouth 2 (two) times daily.   Marland Kitchen levothyroxine (SYNTHROID, LEVOTHROID) 75 MCG tablet Take 75 mcg by mouth daily before breakfast.    . metFORMIN (GLUCOPHAGE) 1000 MG tablet Take 1 tablet by mouth 2 (two) times daily.   . pegfilgrastim (NEULASTA) 6 MG/0.6ML injection Inject 6 mg into  the skin See admin instructions. To be administered 3 days after chemo therapy treatment   . potassium chloride SA (K-DUR,KLOR-CON) 20 MEQ tablet Take 1 tablet (20 mEq total) by mouth daily.   . predniSONE (DELTASONE) 20 MG tablet Take 5 tablets (100 mg total) by mouth daily. Take on days 1-5 of chemotherapy.   . prochlorperazine (COMPAZINE) 10 MG tablet Take 1 tablet (10 mg total) by mouth every 6 (six) hours as needed for nausea or vomiting.   . triamterene-hydrochlorothiazide (MAXZIDE-25) 37.5-25 MG per tablet Take 1 tablet by mouth daily.     . valACYclovir (VALTREX) 500 MG tablet Take 1 tablet (500 mg total) by mouth daily.   . vinCRIStine 2 mg in sodium chloride 0.9 % 50 mL Inject 2 mg into the vein every 21 ( twenty-one) days.   . vitamin B-12 (CYANOCOBALAMIN) 1000 MCG tablet Take 1 tablet (1,000 mcg total) by mouth daily.   . vitamin C (ASCORBIC ACID) 500 MG tablet Take 500 mg by mouth 2 (two) times daily.   . heparin lock flush 100 UNIT/ML injection     No facility-administered encounter medications on file as of 02/12/2018.     ALLERGIES:  Allergies  Allergen Reactions  . Doxycycline Anaphylaxis  . Penicillins Anaphylaxis    Did it involve swelling of the face/tongue/throat, SOB, or low BP? Yes-SOB Did it involve sudden or severe rash/hives, skin peeling, or any reaction on the inside of your mouth or  nose? Unknown Did you need to seek medical attention at a hospital or doctor's office? Yes When did it last happen?Over 10 years ago If all above answers are "NO", may proceed with cephalosporin use.   . Levofloxacin Nausea And Vomiting  . Sulfa Antibiotics Itching and Rash     PHYSICAL EXAM:  ECOG Performance status: 1  Vitals:   02/12/18 0834  BP: 109/75  Pulse: 94  Resp: 18  Temp: 98 F (36.7 C)  SpO2: 100%   Filed Weights   02/12/18 0834  Weight: 114 lb (51.7 kg)    Physical Exam Constitutional:      Appearance: Normal appearance. She is normal weight.  Cardiovascular:     Rate and Rhythm: Normal rate and regular rhythm.     Heart sounds: Normal heart sounds.  Pulmonary:     Effort: Pulmonary effort is normal.     Breath sounds: Normal breath sounds.  Abdominal:     General: Abdomen is flat.     Palpations: Abdomen is soft.  Musculoskeletal: Normal range of motion.  Skin:    General: Skin is warm and dry.  Neurological:     Mental Status: She is alert and oriented to person, place, and time. Mental status is at baseline.  Psychiatric:        Mood and Affect: Mood normal.        Behavior: Behavior normal.        Thought Content: Thought content normal.        Judgment: Judgment normal.      LABORATORY DATA:  I have reviewed the labs as listed.  CBC    Component Value Date/Time   WBC 0.6 (LL) 02/12/2018 0838   RBC 3.03 (L) 02/12/2018 0838   HGB 8.7 (L) 02/12/2018 0838   HCT 26.3 (L) 02/12/2018 0838   HCT 21.1 (L) 01/19/2018 0747   PLT 24 (LL) 02/12/2018 0838   MCV 86.8 02/12/2018 0838   MCH 28.7 02/12/2018 0838   MCHC 33.1 02/12/2018 5732  RDW 14.7 02/12/2018 0838   LYMPHSABS 0.3 (L) 02/12/2018 0838   MONOABS 0.1 02/12/2018 0838   EOSABS 0.0 02/12/2018 0838   BASOSABS 0.0 02/12/2018 0838   CMP Latest Ref Rng & Units 02/12/2018 02/10/2018 02/10/2018  Glucose 70 - 99 mg/dL 145(H) - 195(H)  BUN 6 - 20 mg/dL 17 - 21(H)  Creatinine 0.44 -  1.00 mg/dL 0.76 - 0.68  Sodium 135 - 145 mmol/L 139 - 137  Potassium 3.5 - 5.1 mmol/L 3.0(L) 3.7 3.7  Chloride 98 - 111 mmol/L 108 - 109  CO2 22 - 32 mmol/L 21(L) - 22  Calcium 8.9 - 10.3 mg/dL 7.9(L) - 7.8(L)  Total Protein 6.5 - 8.1 g/dL 5.4(L) - 4.8(L)  Total Bilirubin 0.3 - 1.2 mg/dL 0.7 - 1.2  Alkaline Phos 38 - 126 U/L 123 - 93  AST 15 - 41 U/L 19 - 19  ALT 0 - 44 U/L 30 - 24       DIAGNOSTIC IMAGING:  I have independently reviewed the scans and discussed with the patient.   I have reviewed Francene Finders, NP's note and agree with the documentation.  I personally performed a face-to-face visit, made revisions and my assessment and plan is as follows.    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  PTCL, NOS: -Presentation with pancytopenia, night sweats and fevers for last 1 to 2 months. - CT CAP shows splenomegaly with no lymphadenopathy. 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  T cells do not express TDT, CD1a, CD10, CD15, CD30, CD34, CD56, CD 117 or CD99.  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 - Prognostic index for PTCL (PIT) has 2 positive factors including serum LDH above normal and bone marrow involvement. - She is not a candidate for brentuximab-based regimen as CD30 was negative. -We talked about first-line therapy with CHOP-based regimen.  I have also talked to her about CHOEP regimen which has shown to be helpful for patients less than 60 and normal LDH. - We will obtain a baseline PET CT scan. - She has a PICC line placed for chemotherapy administration. -2D echo on 01/24/2018 shows ejection fraction of 65 to 70%. - Received first cycle of CHOEP on 01/28/2018, Neulasta on 02/02/2018. - PET/CT scan on 02/02/2018 shows splenomegaly, with no hypermetabolism.  No enlarged or hypermetabolic lymphadenopathy in  the neck, chest, abdomen or pelvis. -She met with Dr. Minna Antis at [facility] for consolidation transplantation. -She was admitted to the hospital on 02/07/2018 through 02/10/2018 with small bowel obstruction which resolved with conservative measures.  She did have neutropenic fever and received IV antibiotics.  Since discharge she denies any fevers or chills.  She had one episode of night sweats last night which is mild. - She denied any further vomiting or abdominal pain. -We have reviewed her blood work today.  White count is 0.6, hemoglobin is 8.6 and platelet count is 24. -She will continue her prophylactic antibiotics.  We plan to recheck her labs on 02/16/2018.  Her cycle 2 will be on 02/18/2018.  2.  Hypokalemia: -Potassium was 3.0 today.  She was repleted in the office. -We will start her on potassium 20 mEq daily.      Orders placed this encounter:  Orders Placed This Encounter  Procedures  . Lactate dehydrogenase  . Lactate dehydrogenase  . Uric acid  . CBC with  Differential/Platelet  . Comprehensive metabolic panel  . Uric acid  . Lactate dehydrogenase  . CBC with Differential/Platelet  . Comprehensive metabolic panel      Derek Jack, MD [facility] [phone]

## 2018-02-12 NOTE — Patient Instructions (Signed)
[facility] at [facility]  Discharge Instructions:  You had your PICC flushed today and your dressing changed. _______________________________________________________________  Thank you for choosing [place] at [facility] to provide your oncology and hematology care.  To afford each patient quality time with our providers, please arrive at least 15 minutes before your scheduled appointment.  You need to re-schedule your appointment if you arrive 10 or more minutes late.  We strive to give you quality time with our providers, and arriving late affects you and other patients whose appointments are after yours.  Also, if you no show three or more times for appointments you may be dismissed from the clinic.  Again, thank you for choosing [facility] at [facility] hope is that these requests will allow you access to exceptional care and in a timely manner. _______________________________________________________________  If you have questions after your visit, please contact our office at [phone] between the hours of 8:30 a.m. and 5:00 p.m. Voicemails left after 4:30 p.m. will not be returned until the following business day. _______________________________________________________________  For prescription refill requests, have your pharmacy contact our office. _______________________________________________________________  Recommendations made by the consultant and any test results will be sent to your referring physician. _______________________________________________________________

## 2018-02-12 NOTE — Progress Notes (Signed)
CRITICAL VALUE ALERT Critical value received:  WBC 0.6 Platelets 24 Date of notification:  02-12-2018 Time of notification: 0920 Critical value read back:  Yes.   Nurse who received alert:  C.Millena Callins RN MD notified (1st Atianna Haidar):  Dr. Delton Coombes

## 2018-02-12 NOTE — Progress Notes (Signed)
Kristin Good presents today for lab work and PICC dressing change and flush. Lab work reviewed with Dr. Delton Coombes. Order given for 69mEq K+. Given per order. Dressing changed and PICC flushed per protocol. Discharged self ambulatory in satisfactory condition in presence of husband.

## 2018-02-12 NOTE — Patient Instructions (Signed)
[facility] at [facility] Discharge Instructions     Thank you for choosing [facility] at [facility] to provide your oncology and hematology care.  To afford each patient quality time with our provider, please arrive at least 15 minutes before your scheduled appointment time.   If you have a lab appointment with the Cancer Center please come in thru the  Main Entrance and check in at the main information desk  You need to re-schedule your appointment should you arrive 10 or more minutes late.  We strive to give you quality time with our providers, and arriving late affects you and other patients whose appointments are after yours.  Also, if you no show three or more times for appointments you may be dismissed from the clinic at the providers discretion.     Again, thank you for choosing [facility].  Our hope is that these requests will decrease the amount of time that you wait before being seen by our physicians.       _____________________________________________________________  Should you have questions after your visit to [facility], please contact our office at [phone] between the hours of 8:00 a.m. and 4:30 p.m.  Voicemails left after 4:00 p.m. will not be returned until the following business day.  For prescription refill requests, have your pharmacy contact our office and allow 72 hours.    Cancer Center Support Programs:   > Cancer Support Group  2nd Tuesday of the month 1pm-2pm, Journey Room    

## 2018-02-16 ENCOUNTER — Encounter (HOSPITAL_COMMUNITY): Payer: Self-pay

## 2018-02-16 ENCOUNTER — Inpatient Hospital Stay (HOSPITAL_COMMUNITY): Payer: Commercial Managed Care - PPO | Attending: Hematology

## 2018-02-16 VITALS — BP 108/61 | HR 100 | Temp 98.5°F | Resp 18 | Wt 113.6 lb

## 2018-02-16 DIAGNOSIS — E039 Hypothyroidism, unspecified: Secondary | ICD-10-CM | POA: Insufficient documentation

## 2018-02-16 DIAGNOSIS — Z79899 Other long term (current) drug therapy: Secondary | ICD-10-CM | POA: Insufficient documentation

## 2018-02-16 DIAGNOSIS — R161 Splenomegaly, not elsewhere classified: Secondary | ICD-10-CM | POA: Diagnosis not present

## 2018-02-16 DIAGNOSIS — R000 Tachycardia, unspecified: Secondary | ICD-10-CM | POA: Diagnosis not present

## 2018-02-16 DIAGNOSIS — Z85828 Personal history of other malignant neoplasm of skin: Secondary | ICD-10-CM | POA: Insufficient documentation

## 2018-02-16 DIAGNOSIS — C8447 Peripheral T-cell lymphoma, not classified, spleen: Secondary | ICD-10-CM | POA: Diagnosis not present

## 2018-02-16 DIAGNOSIS — R509 Fever, unspecified: Secondary | ICD-10-CM | POA: Diagnosis not present

## 2018-02-16 DIAGNOSIS — E119 Type 2 diabetes mellitus without complications: Secondary | ICD-10-CM | POA: Diagnosis not present

## 2018-02-16 DIAGNOSIS — D61818 Other pancytopenia: Secondary | ICD-10-CM | POA: Diagnosis not present

## 2018-02-16 DIAGNOSIS — Z5111 Encounter for antineoplastic chemotherapy: Secondary | ICD-10-CM | POA: Diagnosis not present

## 2018-02-16 DIAGNOSIS — I10 Essential (primary) hypertension: Secondary | ICD-10-CM | POA: Insufficient documentation

## 2018-02-16 DIAGNOSIS — E876 Hypokalemia: Secondary | ICD-10-CM | POA: Insufficient documentation

## 2018-02-16 DIAGNOSIS — R61 Generalized hyperhidrosis: Secondary | ICD-10-CM | POA: Insufficient documentation

## 2018-02-16 LAB — CBC WITH DIFFERENTIAL/PLATELET
Abs Immature Granulocytes: 0.13 10*3/uL — ABNORMAL HIGH (ref 0.00–0.07)
Basophils Absolute: 0 10*3/uL (ref 0.0–0.1)
Basophils Relative: 0 %
Eosinophils Absolute: 0 10*3/uL (ref 0.0–0.5)
Eosinophils Relative: 0 %
HCT: 25.3 % — ABNORMAL LOW (ref 36.0–46.0)
Hemoglobin: 8.2 g/dL — ABNORMAL LOW (ref 12.0–15.0)
Immature Granulocytes: 13 %
Lymphocytes Relative: 26 %
Lymphs Abs: 0.3 10*3/uL — ABNORMAL LOW (ref 0.7–4.0)
MCH: 28.3 pg (ref 26.0–34.0)
MCHC: 32.4 g/dL (ref 30.0–36.0)
MCV: 87.2 fL (ref 80.0–100.0)
Monocytes Absolute: 0.2 10*3/uL (ref 0.1–1.0)
Monocytes Relative: 19 %
Neutro Abs: 0.4 10*3/uL — ABNORMAL LOW (ref 1.7–7.7)
Neutrophils Relative %: 42 %
PLATELETS: 46 10*3/uL — AB (ref 150–400)
RBC: 2.9 MIL/uL — ABNORMAL LOW (ref 3.87–5.11)
RDW: 14.4 % (ref 11.5–15.5)
WBC: 1 10*3/uL — CL (ref 4.0–10.5)
nRBC: 0 % (ref 0.0–0.2)

## 2018-02-16 LAB — COMPREHENSIVE METABOLIC PANEL
ALT: 17 U/L (ref 0–44)
AST: 16 U/L (ref 15–41)
Albumin: 3 g/dL — ABNORMAL LOW (ref 3.5–5.0)
Alkaline Phosphatase: 102 U/L (ref 38–126)
Anion gap: 9 (ref 5–15)
BUN: 16 mg/dL (ref 6–20)
CHLORIDE: 108 mmol/L (ref 98–111)
CO2: 25 mmol/L (ref 22–32)
CREATININE: 0.73 mg/dL (ref 0.44–1.00)
Calcium: 8 mg/dL — ABNORMAL LOW (ref 8.9–10.3)
GFR calc Af Amer: >60 mL/min (ref >60)
GFR calc non Af Amer: >60 mL/min (ref >60)
Glucose, Bld: 172 mg/dL — ABNORMAL HIGH (ref 70–99)
Potassium: 2.9 mmol/L — ABNORMAL LOW (ref 3.5–5.1)
Sodium: 142 mmol/L (ref 135–145)
Total Bilirubin: 0.9 mg/dL (ref 0.3–1.2)
Total Protein: 5.3 g/dL — ABNORMAL LOW (ref 6.5–8.1)

## 2018-02-16 LAB — SAMPLE TO BLOOD BANK

## 2018-02-16 LAB — LACTATE DEHYDROGENASE: LDH: 154 U/L (ref 98–192)

## 2018-02-16 LAB — URIC ACID: Uric Acid, Serum: 4.6 mg/dL (ref 2.5–7.1)

## 2018-02-16 MED ORDER — HEPARIN SOD (PORK) LOCK FLUSH 100 UNIT/ML IV SOLN
INTRAVENOUS | Status: AC
  Filled 2018-02-16: qty 5

## 2018-02-16 MED ORDER — POTASSIUM CHLORIDE CRYS ER 20 MEQ PO TBCR
40.0000 meq | EXTENDED_RELEASE_TABLET | Freq: Once | ORAL | Status: AC
  Administered 2018-02-16: 40 meq via ORAL

## 2018-02-16 MED ORDER — MAGIC MOUTHWASH: 5.0000 mL | Freq: Three times a day (TID) | ORAL | 0 refills | Status: DC | PRN

## 2018-02-16 MED ORDER — HEPARIN SOD (PORK) LOCK FLUSH 100 UNIT/ML IV SOLN
300.0000 [IU] | Freq: Once | INTRAVENOUS | Status: AC
  Administered 2018-02-16: 300 [IU] via INTRAVENOUS

## 2018-02-16 MED ORDER — POTASSIUM CHLORIDE 10 MEQ/100ML IV SOLN
10.0000 meq | INTRAVENOUS | Status: AC
  Administered 2018-02-16 (×2): 10 meq via INTRAVENOUS
  Filled 2018-02-16 (×4): qty 100

## 2018-02-16 MED ORDER — SODIUM CHLORIDE 0.9 % IV SOLN
INTRAVENOUS | Status: DC
  Administered 2018-02-16: 10:00:00 via INTRAVENOUS

## 2018-02-16 NOTE — Progress Notes (Signed)
CRITICAL VALUE ALERT  Critical Value:  WBC 1.0  Date & Time Notied:  02/16/2018 at 0927  Provider Notified: Dr. Delton Coombes  Orders Received/Actions taken: n/a

## 2018-02-16 NOTE — Progress Notes (Signed)
Pt. Here today for picc flush and labs. No new issues at this time. Will wait for labs results.   Labs reviewed by Dr. Delton Coombes. Will give potassium by mouth and iv per orders. Two runs of potassium given per orders.   Patient tolerated it well without problems. Vitals stable and discharged home from clinic ambulatory. Follow up as scheduled.

## 2018-02-16 NOTE — Patient Instructions (Signed)
[facility] at [facility]  Discharge Instructions:   _______________________________________________________________  Thank you for choosing [facility] at [facility] to provide your oncology and hematology care.  To afford each patient quality time with our providers, please arrive at least 15 minutes before your scheduled appointment.  You need to re-schedule your appointment if you arrive 10 or more minutes late.  We strive to give you quality time with our providers, and arriving late affects you and other patients whose appointments are after yours.  Also, if you no show three or more times for appointments you may be dismissed from the clinic.  Again, thank you for choosing  [facility] at  Hospital. Our hope is that these requests will allow you access to exceptional care and in a timely manner. _______________________________________________________________  If you have questions after your visit, please contact our office at [phone] between the hours of 8:30 a.m. and 5:00 p.m. Voicemails left after 4:30 p.m. will not be returned until the following business day. _______________________________________________________________  For prescription refill requests, have your pharmacy contact our office. _______________________________________________________________  Recommendations made by the consultant and any test results will be sent to your referring physician. _______________________________________________________________ 

## 2018-02-16 NOTE — Addendum Note (Signed)
Addended by: Glennie Isle on: 02/16/2018 02:45 PM   Modules accepted: Orders

## 2018-02-18 ENCOUNTER — Other Ambulatory Visit (HOSPITAL_COMMUNITY): Payer: Self-pay | Admitting: Nurse Practitioner

## 2018-02-18 ENCOUNTER — Encounter (HOSPITAL_COMMUNITY): Payer: Self-pay | Admitting: Hematology

## 2018-02-18 ENCOUNTER — Inpatient Hospital Stay (HOSPITAL_COMMUNITY): Payer: Commercial Managed Care - PPO

## 2018-02-18 ENCOUNTER — Inpatient Hospital Stay (HOSPITAL_BASED_OUTPATIENT_CLINIC_OR_DEPARTMENT_OTHER): Payer: Commercial Managed Care - PPO | Admitting: Hematology

## 2018-02-18 VITALS — BP 136/84 | HR 102 | Temp 98.6°F | Resp 16 | Wt 114.0 lb

## 2018-02-18 DIAGNOSIS — C8447 Peripheral T-cell lymphoma, not classified, spleen: Secondary | ICD-10-CM

## 2018-02-18 DIAGNOSIS — R161 Splenomegaly, not elsewhere classified: Secondary | ICD-10-CM

## 2018-02-18 DIAGNOSIS — D61818 Other pancytopenia: Secondary | ICD-10-CM

## 2018-02-18 DIAGNOSIS — R61 Generalized hyperhidrosis: Secondary | ICD-10-CM | POA: Diagnosis not present

## 2018-02-18 DIAGNOSIS — Z79899 Other long term (current) drug therapy: Secondary | ICD-10-CM

## 2018-02-18 DIAGNOSIS — R509 Fever, unspecified: Secondary | ICD-10-CM | POA: Diagnosis not present

## 2018-02-18 DIAGNOSIS — E876 Hypokalemia: Secondary | ICD-10-CM

## 2018-02-18 LAB — CBC WITH DIFFERENTIAL/PLATELET
Abs Immature Granulocytes: 0.1 10*3/uL — ABNORMAL HIGH (ref 0.00–0.07)
Basophils Absolute: 0 10*3/uL (ref 0.0–0.1)
Basophils Relative: 0 %
Eosinophils Absolute: 0 10*3/uL (ref 0.0–0.5)
Eosinophils Relative: 0 %
HCT: 23.6 % — ABNORMAL LOW (ref 36.0–46.0)
Hemoglobin: 7.6 g/dL — ABNORMAL LOW (ref 12.0–15.0)
Immature Granulocytes: 11 %
Lymphocytes Relative: 14 %
Lymphs Abs: 0.1 10*3/uL — ABNORMAL LOW (ref 0.7–4.0)
MCH: 29.2 pg (ref 26.0–34.0)
MCHC: 32.2 g/dL (ref 30.0–36.0)
MCV: 90.8 fL (ref 80.0–100.0)
Monocytes Absolute: 0.1 10*3/uL (ref 0.1–1.0)
Monocytes Relative: 7 %
NRBC: 0 % (ref 0.0–0.2)
Neutro Abs: 0.7 10*3/uL — ABNORMAL LOW (ref 1.7–7.7)
Neutrophils Relative %: 68 %
Platelets: 64 10*3/uL — ABNORMAL LOW (ref 150–400)
RBC: 2.6 MIL/uL — ABNORMAL LOW (ref 3.87–5.11)
RDW: 14.8 % (ref 11.5–15.5)
WBC: 1 10*3/uL — CL (ref 4.0–10.5)

## 2018-02-18 LAB — COMPREHENSIVE METABOLIC PANEL
ALT: 13 U/L (ref 0–44)
AST: 17 U/L (ref 15–41)
Albumin: 3 g/dL — ABNORMAL LOW (ref 3.5–5.0)
Alkaline Phosphatase: 90 U/L (ref 38–126)
Anion gap: 10 (ref 5–15)
BILIRUBIN TOTAL: 0.7 mg/dL (ref 0.3–1.2)
BUN: 19 mg/dL (ref 6–20)
CO2: 22 mmol/L (ref 22–32)
Calcium: 7.9 mg/dL — ABNORMAL LOW (ref 8.9–10.3)
Chloride: 108 mmol/L (ref 98–111)
Creatinine, Ser: 0.88 mg/dL (ref 0.44–1.00)
GFR calc Af Amer: >60 mL/min (ref >60)
GFR calc non Af Amer: >60 mL/min (ref >60)
Glucose, Bld: 242 mg/dL — ABNORMAL HIGH (ref 70–99)
Potassium: 3.7 mmol/L (ref 3.5–5.1)
Sodium: 140 mmol/L (ref 135–145)
Total Protein: 5.1 g/dL — ABNORMAL LOW (ref 6.5–8.1)

## 2018-02-18 LAB — URIC ACID: Uric Acid, Serum: 6 mg/dL (ref 2.5–7.1)

## 2018-02-18 LAB — PREPARE RBC (CROSSMATCH)

## 2018-02-18 LAB — LACTATE DEHYDROGENASE: LDH: 160 U/L (ref 98–192)

## 2018-02-18 MED ORDER — DOXORUBICIN HCL CHEMO IV INJECTION 2 MG/ML
50.0000 mg/m2 | Freq: Once | INTRAVENOUS | Status: DC
  Filled 2018-02-18: qty 42

## 2018-02-18 MED ORDER — VINCRISTINE SULFATE CHEMO INJECTION 1 MG/ML
2.0000 mg | Freq: Once | INTRAVENOUS | Status: AC
  Administered 2018-02-18: 2 mg via INTRAVENOUS
  Filled 2018-02-18: qty 2

## 2018-02-18 MED ORDER — SODIUM CHLORIDE 0.9 % IV SOLN
Freq: Once | INTRAVENOUS | Status: AC
  Administered 2018-02-18: 10:00:00 via INTRAVENOUS
  Filled 2018-02-18: qty 5

## 2018-02-18 MED ORDER — SODIUM CHLORIDE 0.9 % IV SOLN
100.0000 mg/m2 | Freq: Once | INTRAVENOUS | Status: AC
  Administered 2018-02-18: 150 mg via INTRAVENOUS
  Filled 2018-02-18: qty 7.5

## 2018-02-18 MED ORDER — DIPHENHYDRAMINE HCL 25 MG PO CAPS
25.0000 mg | ORAL_CAPSULE | Freq: Once | ORAL | Status: AC
  Administered 2018-02-18: 25 mg via ORAL
  Filled 2018-02-18: qty 1

## 2018-02-18 MED ORDER — SODIUM CHLORIDE 0.9 % IV SOLN
750.0000 mg/m2 | Freq: Once | INTRAVENOUS | Status: AC
  Administered 2018-02-18: 1140 mg via INTRAVENOUS
  Filled 2018-02-18: qty 50

## 2018-02-18 MED ORDER — SODIUM CHLORIDE 0.9 % IV SOLN
100.0000 mg/m2 | Freq: Once | INTRAVENOUS | Status: DC
  Filled 2018-02-18: qty 8.5

## 2018-02-18 MED ORDER — ALLOPURINOL 300 MG PO TABS: 300.0000 mg | ORAL_TABLET | Freq: Every day | ORAL | 3 refills | Status: DC

## 2018-02-18 MED ORDER — DOXORUBICIN HCL CHEMO IV INJECTION 2 MG/ML
50.0000 mg/m2 | Freq: Once | INTRAVENOUS | Status: AC
  Administered 2018-02-18: 76 mg via INTRAVENOUS
  Filled 2018-02-18: qty 38

## 2018-02-18 MED ORDER — PALONOSETRON HCL INJECTION 0.25 MG/5ML
0.2500 mg | Freq: Once | INTRAVENOUS | Status: AC
  Administered 2018-02-18: 0.25 mg via INTRAVENOUS
  Filled 2018-02-18: qty 5

## 2018-02-18 MED ORDER — ACETAMINOPHEN 325 MG PO TABS
650.0000 mg | ORAL_TABLET | Freq: Once | ORAL | Status: AC
  Administered 2018-02-18: 650 mg via ORAL
  Filled 2018-02-18: qty 2

## 2018-02-18 MED ORDER — SODIUM CHLORIDE 0.9 % IV SOLN
Freq: Once | INTRAVENOUS | Status: AC
  Administered 2018-02-18: 09:00:00 via INTRAVENOUS

## 2018-02-18 MED ORDER — HEPARIN SOD (PORK) LOCK FLUSH 100 UNIT/ML IV SOLN
500.0000 [IU] | Freq: Every day | INTRAVENOUS | Status: AC | PRN
  Administered 2018-02-18: 500 [IU]
  Filled 2018-02-18: qty 5

## 2018-02-18 MED ORDER — VINCRISTINE SULFATE CHEMO INJECTION 1 MG/ML
2.0000 mg | Freq: Once | INTRAVENOUS | Status: DC
  Filled 2018-02-18: qty 2

## 2018-02-18 MED ORDER — SODIUM CHLORIDE 0.9 % IV SOLN
750.0000 mg/m2 | Freq: Once | INTRAVENOUS | Status: DC
  Filled 2018-02-18: qty 63

## 2018-02-18 MED ORDER — SODIUM CHLORIDE 0.9% FLUSH
10.0000 mL | INTRAVENOUS | Status: AC | PRN
  Administered 2018-02-18: 10 mL

## 2018-02-18 MED ORDER — HEPARIN SOD (PORK) LOCK FLUSH 100 UNIT/ML IV SOLN
INTRAVENOUS | Status: AC
  Filled 2018-02-18: qty 5

## 2018-02-18 MED ORDER — SODIUM CHLORIDE 0.9% FLUSH
10.0000 mL | INTRAVENOUS | Status: AC | PRN
  Administered 2018-02-18: 10 mL
  Filled 2018-02-18: qty 10

## 2018-02-18 MED ORDER — SODIUM CHLORIDE 0.9% IV SOLUTION
250.0000 mL | Freq: Once | INTRAVENOUS | Status: AC
  Administered 2018-02-18: 250 mL via INTRAVENOUS

## 2018-02-18 NOTE — Progress Notes (Signed)
[facility], [place] [address]   CLINIC:  Medical Oncology/Hematology  PCP:  Marinda Elk, [place] [address] [phone]   REASON FOR VISIT: Follow-up for Peripheral T cell Lymphoma.  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[place])    01/28/2018 -  Chemotherapy    The patient had DOXOrubicin (ADRIAMYCIN) chemo injection 50 mg, 30 mg/m2 = 50 mg (60 % of original dose 50 mg/m2), Intravenous,  Once, 2 of 8 cycles Dose modification: 30 mg/m2 (60 % of original dose 50 mg/m2, Cycle 1, Reason: Other (see comments), Comment: elevated bilirubin) Administration: 50 mg (01/28/2018) palonosetron (ALOXI) injection 0.25 mg, 0.25 mg, Intravenous,  Once, 2 of 8 cycles Administration: 0.25 mg (01/28/2018), 0.25 mg (02/18/2018) pegfilgrastim (NEULASTA ONPRO KIT) injection 6 mg, 6 mg, Subcutaneous, Once, 2 of 8 cycles vinCRIStine (ONCOVIN) 1 mg in sodium chloride 0.9 % 50 mL chemo infusion, 1 mg (50 % of original dose 2 mg), Intravenous,  Once, 2 of 8 cycles Dose modification: 1 mg (50 % of original dose 2 mg, Cycle 1, Reason: Other (see comments), Comment: liver dysfunctyion) Administration: 1 mg (01/28/2018) cyclophosphamide (CYTOXAN) 1,260 mg in sodium chloride 0.9 % 250 mL chemo infusion, 750 mg/m2 = 1,260 mg, Intravenous,  Once, 2 of 8 cycles Administration: 1,260 mg (01/28/2018) etoposide (VEPESID) 130 mg in sodium chloride 0.9 % 500 mL chemo infusion, 75 mg/m2 = 130 mg (75 % of original dose 100 mg/m2), Intravenous,  Once, 2 of 8 cycles Dose modification: 75 mg/m2 (75 % of original dose 100 mg/m2, Cycle 1, Reason: Change in LFTs), 75 mg/m2 (75 % of original dose 100 mg/m2, Cycle 1, Reason: Change in LFTs) Administration: 130 mg (01/28/2018), 130 mg (01/29/2018), 130 mg  (01/30/2018) fosaprepitant (EMEND) 150 mg, dexamethasone (DECADRON) 12 mg in sodium chloride 0.9 % 145 mL IVPB, , Intravenous,  Once, 2 of 6 cycles Administration:  (01/28/2018)  for chemotherapy treatment.       INTERVAL HISTORY:  Kristin Good 58 y.o. female returns for routine follow-up for Peripheral T cell Lymphoma. She is here today with her husband and ready for her next treatment. She reports fatigue and ankle swelling. Denies any nausea, vomiting, or diarrhea. Denies any new pains. Had not noticed any recent bleeding such as epistaxis, hematuria or hematochezia. Denies recent chest pain on exertion, shortness of breath on minimal exertion, pre-syncopal episodes, or palpitations. Denies any numbness or tingling in hands or feet. Denies any recent fevers, infections, or recent hospitalizations. Patient reports appetite at 75% and energy level at 50%. She is maintaining her weight well at this time.    REVIEW OF SYSTEMS:  Review of Systems  Constitutional: Positive for fatigue.  Cardiovascular: Positive for leg swelling.  All other systems reviewed and are negative.    PAST MEDICAL/SURGICAL HISTORY:  Past Medical History:  Diagnosis Date  . Diabetes ([place]) 01/20/2018  . HTN (hypertension)   . Hypothyroidism   . 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 COLONOSCOPY  07/27/2010   Procedure: COLONOSCOPY;  Surgeon: Dorothyann Peng, MD;  Location: [facility];  Service: Endoscopy;  Laterality: N/A;  . infertility surgery    . MOHS SURGERY     basal cell carcinoma  .  REDUCTION MAMMAPLASTY Bilateral 2002     SOCIAL HISTORY:  Social History   Socioeconomic History  . Marital status: Married    Spouse name: Not on file  . Number of children: 0  . Years of education: Not on file  . Highest education level: Not on file  Occupational History  . Occupation: [employer]    Comment: Nurse  Social Needs  . Financial  resource strain: Not on file  . Food insecurity:    Worry: Not on file    Inability: Not on file  . Transportation needs:    Medical: Not on file    Non-medical: Not on file  Tobacco Use  . Smoking status: Never Smoker  . Smokeless tobacco: Never Used  Substance and Sexual Activity  . Alcohol use: Yes    Comment: once or twice a year  . Drug use: No  . Sexual activity: Not on file  Lifestyle  . Physical activity:    Days per week: Not on file    Minutes per session: Not on file  . Stress: Not on file  Relationships  . Social connections:    Talks on phone: Not on file    Gets together: Not on file    Attends religious service: Not on file    Active member of club or organization: Not on file    Attends meetings of clubs or organizations: Not on file    Relationship status: Not on file  . Intimate partner violence:    Fear of current or ex partner: Not on file    Emotionally abused: Not on file    Physically abused: Not on file    Forced sexual activity: Not on file  Other Topics Concern  . Not on file  Social History Narrative  . Not on file    FAMILY HISTORY:  Family History  Problem Relation Age of Onset  . Heart disease Mother   . Diabetes Father   . Breast cancer Sister 48  . Breast cancer Maternal Aunt   . Colon cancer Neg Hx   . Liver disease Neg Hx     CURRENT MEDICATIONS:  Outpatient Encounter Medications as of 02/18/2018  Medication Sig Note  . allopurinol (ZYLOPRIM) 300 MG tablet Take 1 tablet (300 mg total) by mouth daily.   Marland Kitchen atorvastatin (LIPITOR) 20 MG tablet Take 1 tablet by mouth every morning.    . calcium-vitamin D (CALCIUM 500+D HIGH POTENCY) 500-400 MG-UNIT tablet Take 1 tablet by mouth 2 (two) times daily.   . ciprofloxacin (CIPRO) 500 MG tablet Take 1 tablet (500 mg total) by mouth 2 (two) times daily. 02/02/2018: Prophylactic   . cyclophosphamide in sodium chloride 0.9 % 250 mL Inject 1,260 mg into the vein every 21 ( twenty-one) days.   Marland Kitchen  DOXOrubicin HCl (ADRIAMYCIN IV) Inject 84 mg into the vein every 21 ( twenty-one) days.   . ETOPOSIDE IV Inject 170 mg into the vein every 21 ( twenty-one) days. Days 1-3   . fluconazole (DIFLUCAN) 100 MG tablet Take 1 tablet (100 mg total) by mouth daily.   . furosemide (LASIX) 20 MG tablet Take 1 tablet (20 mg total) by mouth daily as needed. (Patient taking differently: Take 20 mg by mouth daily as needed for fluid. )   . Glucosamine-Chondroit-Vit C-Mn (GLUCOSAMINE 1500 COMPLEX PO) Take 1 capsule by mouth 2 (two) times daily.   Marland Kitchen levothyroxine (SYNTHROID, LEVOTHROID) 75 MCG tablet Take 75 mcg by mouth daily  before breakfast.    . magic mouthwash SOLN Take 5 mLs by mouth 3 (three) times daily as needed for mouth pain.   . magic mouthwash SOLN Take 5 mLs by mouth 3 (three) times daily as needed for mouth pain. With lidocaine 1% 1:1 solution   . metFORMIN (GLUCOPHAGE) 1000 MG tablet Take 1 tablet by mouth 2 (two) times daily.   . pegfilgrastim (NEULASTA) 6 MG/0.6ML injection Inject 6 mg into the skin See admin instructions. To be administered 3 days after chemo therapy treatment   . potassium chloride SA (K-DUR,KLOR-CON) 20 MEQ tablet Take 1 tablet (20 mEq total) by mouth daily.   . predniSONE (DELTASONE) 20 MG tablet Take 5 tablets (100 mg total) by mouth daily. Take on days 1-5 of chemotherapy.   . prochlorperazine (COMPAZINE) 10 MG tablet Take 1 tablet (10 mg total) by mouth every 6 (six) hours as needed for nausea or vomiting.   . triamterene-hydrochlorothiazide (MAXZIDE-25) 37.5-25 MG per tablet Take 1 tablet by mouth daily.     . valACYclovir (VALTREX) 500 MG tablet Take 1 tablet (500 mg total) by mouth daily.   . vinCRIStine 2 mg in sodium chloride 0.9 % 50 mL Inject 2 mg into the vein every 21 ( twenty-one) days.   . vitamin B-12 (CYANOCOBALAMIN) 1000 MCG tablet Take 1 tablet (1,000 mcg total) by mouth daily.   . vitamin C (ASCORBIC ACID) 500 MG tablet Take 500 mg by mouth 2 (two) times  daily.    No facility-administered encounter medications on file as of 02/18/2018.     ALLERGIES:  Allergies  Allergen Reactions  . Doxycycline Anaphylaxis  . Penicillins Anaphylaxis    Did it involve swelling of the face/tongue/throat, SOB, or low BP? Yes-SOB Did it involve sudden or severe rash/hives, skin peeling, or any reaction on the inside of your mouth or nose? Unknown Did you need to seek medical attention at a hospital or doctor's office? Yes When did it last happen?Over 10 years ago If all above answers are "NO", may proceed with cephalosporin use.   . Levofloxacin Nausea And Vomiting  . Sulfa Antibiotics Itching and Rash     PHYSICAL EXAM:  ECOG Performance status: 1  VITAL SIGNS: BP:107/58, P:103, R:18, T:98.2,)2:99% WEIGHT: 113  Physical Exam Constitutional:      Appearance: Normal appearance. She is normal weight.  Cardiovascular:     Rate and Rhythm: Normal rate and regular rhythm.     Heart sounds: Normal heart sounds.  Pulmonary:     Effort: Pulmonary effort is normal.     Breath sounds: Normal breath sounds.  Abdominal:     General: Abdomen is flat.     Palpations: Abdomen is soft.  Musculoskeletal: Normal range of motion.  Skin:    General: Skin is warm and dry.  Neurological:     Mental Status: She is alert and oriented to person, place, and time. Mental status is at baseline.  Psychiatric:        Mood and Affect: Mood normal.        Behavior: Behavior normal.        Thought Content: Thought content normal.        Judgment: Judgment normal.      LABORATORY DATA:  I have reviewed the labs as listed.  CBC    Component Value Date/Time   WBC 1.0 (LL) 02/18/2018 0821   RBC 2.60 (L) 02/18/2018 0821   HGB 7.6 (L) 02/18/2018 0821   HCT 23.6 (L)  02/18/2018 0821   HCT 21.1 (L) 01/19/2018 0747   PLT 64 (L) 02/18/2018 0821   MCV 90.8 02/18/2018 0821   MCH 29.2 02/18/2018 0821   MCHC 32.2 02/18/2018 0821   RDW 14.8 02/18/2018 0821    LYMPHSABS 0.1 (L) 02/18/2018 0821   MONOABS 0.1 02/18/2018 0821   EOSABS 0.0 02/18/2018 0821   BASOSABS 0.0 02/18/2018 0821   CMP Latest Ref Rng & Units 02/18/2018 02/16/2018 02/12/2018  Glucose 70 - 99 mg/dL 242(H) 172(H) 145(H)  BUN 6 - 20 mg/dL '19 16 17  '$ Creatinine 0.44 - 1.00 mg/dL 0.88 0.73 0.76  Sodium 135 - 145 mmol/L 140 142 139  Potassium 3.5 - 5.1 mmol/L 3.7 2.9(L) 3.0(L)  Chloride 98 - 111 mmol/L 108 108 108  CO2 22 - 32 mmol/L 22 25 21(L)  Calcium 8.9 - 10.3 mg/dL 7.9(L) 8.0(L) 7.9(L)  Total Protein 6.5 - 8.1 g/dL 5.1(L) 5.3(L) 5.4(L)  Total Bilirubin 0.3 - 1.2 mg/dL 0.7 0.9 0.7  Alkaline Phos 38 - 126 U/L 90 102 123  AST 15 - 41 U/L '17 16 19  '$ ALT 0 - 44 U/L '13 17 30       '$ DIAGNOSTIC IMAGING:  I have independently reviewed the scans and discussed with the patient.   I have reviewed Kristin Finders, NP's note and agree with the documentation.  I personally performed a face-to-face visit, made revisions and my assessment and plan is as follows.    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  PTCL, NOS: -Presentation with pancytopenia, night sweats and fevers for last 1 to 2 months. - CT CAP shows splenomegaly with no lymphadenopathy. 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  T cells do not express TDT, CD1a, CD10, CD15, CD30, CD34, CD56, CD 117 or CD99.  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 - Prognostic index for PTCL (PIT) has 2 positive factors including serum LDH above normal and bone marrow involvement. - She is not a candidate for brentuximab-based regimen as CD30 was negative. -We talked about first-line therapy with CHOP-based regimen.  I have also talked to her about CHOEP regimen which has shown to be helpful for patients less than 60 and normal LDH. - We will obtain a baseline PET CT scan. - She has a  PICC line placed for chemotherapy administration. -2D echo on 01/24/2018 shows ejection fraction of 65 to 70%. - Received first cycle of CHOEP on 01/28/2018, Neulasta on 02/02/2018. - PET/CT scan on 02/02/2018 shows splenomegaly, with no hypermetabolism.  No enlarged or hypermetabolic lymphadenopathy in the neck, chest, abdomen or pelvis. -She met with [facility] Minna [facility]is [facility] for consolidation transplantation. -She was admitted to the hospital on 02/07/2018 through 02/10/2018 with small bowel obstruction which resolved with conservative measures.  She did have neutropenic fever and received IV antibiotics.  Since discharge she denies any fevers or chills.  She had one episode of night sweats last night which is mild. - She has been feeling fine lately.  Night sweats are completely gone for the last 1 week.  Energy levels have improved. - We reviewed her blood work today.  Hemoglobin is 7.6.  We will plan to give her 1 unit of PRBC.  Platelet count and white count has improved. - She will continue prophylactic antibiotics. -She may proceed with cycle 2 of CHOEP with Neulasta support.  We  will continue to monitor weekly labs. -She will have restaging bone marrow biopsy and PET CT scan following cycle 4 of therapy.  She will follow-up with Dr. Minna Antis after restaging studies.  If she is in complete response, peripheral blood stem cells will be collected following cycle 5 of therapy.  If CR is not achieved, allogenic stem cell transplant using a non-myeloablative approach will be considered.  HLA typing of the patient and her 3 siblings will be done. -We will see her back in 3 weeks for follow-up.  2.  Hypokalemia: -Potassium is normal.  She will continue potassium daily.       Orders placed this encounter:  Orders Placed This Encounter  Procedures  . CBC with Differential/Platelet  . Comprehensive metabolic panel  . Sample to Blood Bank      Derek Jack, [place] [phone]

## 2018-02-18 NOTE — Assessment & Plan Note (Signed)
1.  PTCL, NOS: -Presentation with pancytopenia, night sweats and fevers for last 1 to 2 months. - CT CAP shows splenomegaly with no lymphadenopathy. 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  T cells do not express TDT, CD1a, CD10, CD15, CD30, CD34, CD56, CD 117 or CD99.  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 - Prognostic index for PTCL (PIT) has 2 positive factors including serum LDH above normal and bone marrow involvement. - She is not a candidate for brentuximab-based regimen as CD30 was negative. -We talked about first-line therapy with CHOP-based regimen.  I have also talked to her about CHOEP regimen which has shown to be helpful for patients less than 60 and normal LDH. - We will obtain a baseline PET CT scan. - She has a PICC line placed for chemotherapy administration. -2D echo on 01/24/2018 shows ejection fraction of 65 to 70%. - Received first cycle of CHOEP on 01/28/2018, Neulasta on 02/02/2018. - PET/CT scan on 02/02/2018 shows splenomegaly, with no hypermetabolism.  No enlarged or hypermetabolic lymphadenopathy in the neck, chest, abdomen or pelvis. -She met with Dr. Minna Antis at [facility] for consolidation transplantation. -She was admitted to the hospital on 02/07/2018 through 02/10/2018 with small bowel obstruction which resolved with conservative measures.  She did have neutropenic fever and received IV antibiotics.  Since discharge she denies any fevers or chills.  She had one episode of night sweats last night which is mild. - She has been feeling fine lately.  Night sweats are completely gone for the last 1 week.  Energy levels have improved. - We reviewed her blood work today.  Hemoglobin is 7.6.  We will plan to give her 1 unit of PRBC.  Platelet count and white count has improved. - She will continue prophylactic antibiotics. -She may proceed with cycle  2 of CHOEP with Neulasta support.  We will continue to monitor weekly labs. -She will have restaging bone marrow biopsy and PET CT scan following cycle 4 of therapy.  She will follow-up with Dr. Minna Antis after restaging studies.  If she is in complete response, peripheral blood stem cells will be collected following cycle 5 of therapy.  If CR is not achieved, allogenic stem cell transplant using a non-myeloablative approach will be considered.  HLA typing of the patient and her 3 siblings will be done. -We will see her back in 3 weeks for follow-up.  2.  Hypokalemia: -Potassium is normal.  She will continue potassium daily.

## 2018-02-18 NOTE — Patient Instructions (Signed)
[facility] Discharge Instructions     Thank you for choosing [facility] at [facility] to provide your oncology and hematology care.  To afford each patient quality time with our provider, please arrive at least 15 minutes before your scheduled appointment time.   If you have a lab appointment with the Cancer Center please come in thru the  Main Entrance and check in at the main information desk  You need to re-schedule your appointment should you arrive 10 or more minutes late.  We strive to give you quality time with our providers, and arriving late affects you and other patients whose appointments are after yours.  Also, if you no show three or more times for appointments you may be dismissed from the clinic at the providers discretion.     Again, thank you for choosing [facility].  Our hope is that these requests will decrease the amount of time that you wait before being seen by our physicians.       _____________________________________________________________  Should you have questions after your visit to [facility], please contact our office at [phone] between the hours of 8:00 a.m. and 4:30 p.m.  Voicemails left after 4:00 p.m. will not be returned until the following business day.  For prescription refill requests, have your pharmacy contact our office and allow 72 hours.    Cancer Center Support Programs:   > Cancer Support Group  2nd Tuesday of the month 1pm-2pm, Journey Room    

## 2018-02-18 NOTE — Progress Notes (Unsigned)
Patient presented today for cycle 2 day 1,  no new issues at this time. Drew labs today per MD.  Labs reviewed at office visit today. Will treat today and given one unit of blood per MD.  .  Treatment given per orders. Patient tolerated it well without problems. Vitals stable and discharged home from clinic ambulatory. Follow up as scheduled.

## 2018-02-18 NOTE — Progress Notes (Unsigned)
CRITICAL VALUE ALERT  Critical Value:  WBC 1.0  Date & Time Notied:  02/18/2018 at 0903  Provider Notified: Dr. Delton Coombes  Orders Received/Actions taken: n/a

## 2018-02-18 NOTE — Treatment Plan (Signed)
liver infiltration subsided. full dose chemotherapy today per Dr Delton Coombes

## 2018-02-19 ENCOUNTER — Inpatient Hospital Stay (HOSPITAL_COMMUNITY): Payer: Commercial Managed Care - PPO

## 2018-02-19 ENCOUNTER — Ambulatory Visit (HOSPITAL_COMMUNITY): Payer: Commercial Managed Care - PPO

## 2018-02-19 ENCOUNTER — Ambulatory Visit (HOSPITAL_COMMUNITY): Payer: Commercial Managed Care - PPO | Admitting: Hematology

## 2018-02-19 VITALS — BP 134/63 | HR 87 | Temp 97.6°F | Resp 18 | Wt 116.6 lb

## 2018-02-19 DIAGNOSIS — C8447 Peripheral T-cell lymphoma, not classified, spleen: Secondary | ICD-10-CM | POA: Diagnosis not present

## 2018-02-19 LAB — COMPREHENSIVE METABOLIC PANEL
ALT: 21 U/L (ref 0–44)
ANION GAP: 12 (ref 5–15)
AST: 22 U/L (ref 15–41)
Albumin: 3.5 g/dL (ref 3.5–5.0)
Alkaline Phosphatase: 105 U/L (ref 38–126)
BILIRUBIN TOTAL: 1 mg/dL (ref 0.3–1.2)
BUN: 28 mg/dL — ABNORMAL HIGH (ref 6–20)
CO2: 21 mmol/L — ABNORMAL LOW (ref 22–32)
Calcium: 8 mg/dL — ABNORMAL LOW (ref 8.9–10.3)
Chloride: 104 mmol/L (ref 98–111)
Creatinine, Ser: 0.82 mg/dL (ref 0.44–1.00)
GFR calc Af Amer: >60 mL/min (ref >60)
GFR calc non Af Amer: >60 mL/min (ref >60)
GLUCOSE: 252 mg/dL — AB (ref 70–99)
Potassium: 4 mmol/L (ref 3.5–5.1)
Sodium: 137 mmol/L (ref 135–145)
TOTAL PROTEIN: 5.6 g/dL — AB (ref 6.5–8.1)

## 2018-02-19 LAB — CBC WITH DIFFERENTIAL/PLATELET
Abs Immature Granulocytes: 0.03 10*3/uL (ref 0.00–0.07)
Basophils Absolute: 0 10*3/uL (ref 0.0–0.1)
Basophils Relative: 0 %
Eosinophils Absolute: 0 10*3/uL (ref 0.0–0.5)
Eosinophils Relative: 0 %
HCT: 29.1 % — ABNORMAL LOW (ref 36.0–46.0)
Hemoglobin: 9.7 g/dL — ABNORMAL LOW (ref 12.0–15.0)
Immature Granulocytes: 2 %
Lymphocytes Relative: 11 %
Lymphs Abs: 0.2 10*3/uL — ABNORMAL LOW (ref 0.7–4.0)
MCH: 28.4 pg (ref 26.0–34.0)
MCHC: 33.3 g/dL (ref 30.0–36.0)
MCV: 85.3 fL (ref 80.0–100.0)
Monocytes Absolute: 0.1 10*3/uL (ref 0.1–1.0)
Monocytes Relative: 6 %
Neutro Abs: 1.7 10*3/uL (ref 1.7–7.7)
Neutrophils Relative %: 81 %
Platelets: 106 10*3/uL — ABNORMAL LOW (ref 150–400)
RBC: 3.41 MIL/uL — AB (ref 3.87–5.11)
RDW: 15.9 % — AB (ref 11.5–15.5)
WBC: 2 10*3/uL — AB (ref 4.0–10.5)
nRBC: 0 % (ref 0.0–0.2)

## 2018-02-19 LAB — URIC ACID: Uric Acid, Serum: 4.3 mg/dL (ref 2.5–7.1)

## 2018-02-19 LAB — TYPE AND SCREEN
ABO/RH(D): B POS
ANTIBODY SCREEN: NEGATIVE
Unit division: 0

## 2018-02-19 LAB — BPAM RBC
Blood Product Expiration Date: 202002132359
ISSUE DATE / TIME: 202002051351
Unit Type and Rh: 1700

## 2018-02-19 LAB — PHOSPHORUS: Phosphorus: 4.7 mg/dL — ABNORMAL HIGH (ref 2.5–4.6)

## 2018-02-19 MED ORDER — SODIUM CHLORIDE 0.9 % IV SOLN
100.0000 mg/m2 | Freq: Once | INTRAVENOUS | Status: AC
  Administered 2018-02-19: 150 mg via INTRAVENOUS
  Filled 2018-02-19: qty 7.5

## 2018-02-19 MED ORDER — HEPARIN SOD (PORK) LOCK FLUSH 100 UNIT/ML IV SOLN: 500.0000 [IU] | Freq: Once | INTRAVENOUS | Status: DC

## 2018-02-19 MED ORDER — SODIUM CHLORIDE 0.9% FLUSH: 10.0000 mL | Freq: Once | INTRAVENOUS | Status: DC

## 2018-02-19 MED ORDER — SODIUM CHLORIDE 0.9 % IV SOLN
10.0000 mg | Freq: Once | INTRAVENOUS | Status: DC
  Filled 2018-02-19: qty 1

## 2018-02-19 MED ORDER — SODIUM CHLORIDE 0.9 % IV SOLN
INTRAVENOUS | Status: DC
  Administered 2018-02-19: 12:00:00 via INTRAVENOUS

## 2018-02-19 MED ORDER — SODIUM CHLORIDE 0.9 % IV SOLN
10.0000 mg | Freq: Once | INTRAVENOUS | Status: AC
  Administered 2018-02-19: 10 mg via INTRAVENOUS
  Filled 2018-02-19: qty 10

## 2018-02-19 NOTE — Progress Notes (Signed)
Labs reviewed today with MD. Changed PICC line dressing today per protocol. Treatment given per orders. Patient tolerated it well without problems. Vitals stable and discharged home from clinic ambulatory. Follow up as scheduled.

## 2018-02-19 NOTE — Patient Instructions (Signed)
[facility] Discharge Instructions for Patients Receiving Chemotherapy  Today you received the following chemotherapy agents   To help prevent nausea and vomiting after your treatment, we encourage you to take your nausea medication   If you develop nausea and vomiting that is not controlled by your nausea medication, call the clinic.   BELOW ARE SYMPTOMS THAT SHOULD BE REPORTED IMMEDIATELY:  *FEVER GREATER THAN 100.5 F  *CHILLS WITH OR WITHOUT FEVER  NAUSEA AND VOMITING THAT IS NOT CONTROLLED WITH YOUR NAUSEA MEDICATION  *UNUSUAL SHORTNESS OF BREATH  *UNUSUAL BRUISING OR BLEEDING  TENDERNESS IN MOUTH AND THROAT WITH OR WITHOUT PRESENCE OF ULCERS  *URINARY PROBLEMS  *BOWEL PROBLEMS  UNUSUAL RASH Items with * indicate a potential emergency and should be followed up as soon as possible.  Feel free to call the clinic should you have any questions or concerns. The clinic phone number is [phone].  Please show the CHEMO ALERT CARD at check-in to the Emergency Department and triage nurse.   

## 2018-02-20 ENCOUNTER — Telehealth (HOSPITAL_COMMUNITY): Payer: Self-pay

## 2018-02-20 ENCOUNTER — Inpatient Hospital Stay (HOSPITAL_COMMUNITY): Payer: Commercial Managed Care - PPO

## 2018-02-20 VITALS — BP 129/63 | HR 97 | Temp 98.4°F | Resp 18 | Wt 115.0 lb

## 2018-02-20 DIAGNOSIS — C8447 Peripheral T-cell lymphoma, not classified, spleen: Secondary | ICD-10-CM | POA: Diagnosis not present

## 2018-02-20 LAB — COMPREHENSIVE METABOLIC PANEL
ALT: 17 U/L (ref 0–44)
AST: 18 U/L (ref 15–41)
Albumin: 3.2 g/dL — ABNORMAL LOW (ref 3.5–5.0)
Alkaline Phosphatase: 86 U/L (ref 38–126)
Anion gap: 13 (ref 5–15)
BUN: 49 mg/dL — ABNORMAL HIGH (ref 6–20)
CHLORIDE: 108 mmol/L (ref 98–111)
CO2: 18 mmol/L — ABNORMAL LOW (ref 22–32)
Calcium: 8 mg/dL — ABNORMAL LOW (ref 8.9–10.3)
Creatinine, Ser: 0.97 mg/dL (ref 0.44–1.00)
GFR calc Af Amer: >60 mL/min (ref >60)
GFR calc non Af Amer: >60 mL/min (ref >60)
Glucose, Bld: 349 mg/dL — ABNORMAL HIGH (ref 70–99)
Potassium: 4.1 mmol/L (ref 3.5–5.1)
Sodium: 139 mmol/L (ref 135–145)
Total Bilirubin: 0.6 mg/dL (ref 0.3–1.2)
Total Protein: 5.1 g/dL — ABNORMAL LOW (ref 6.5–8.1)

## 2018-02-20 LAB — CBC WITH DIFFERENTIAL/PLATELET
ABS IMMATURE GRANULOCYTES: 0.02 10*3/uL (ref 0.00–0.07)
Basophils Absolute: 0 10*3/uL (ref 0.0–0.1)
Basophils Relative: 0 %
Eosinophils Absolute: 0 10*3/uL (ref 0.0–0.5)
Eosinophils Relative: 0 %
HCT: 26.7 % — ABNORMAL LOW (ref 36.0–46.0)
Hemoglobin: 8.9 g/dL — ABNORMAL LOW (ref 12.0–15.0)
Immature Granulocytes: 1 %
Lymphocytes Relative: 12 %
Lymphs Abs: 0.2 10*3/uL — ABNORMAL LOW (ref 0.7–4.0)
MCH: 28.9 pg (ref 26.0–34.0)
MCHC: 33.3 g/dL (ref 30.0–36.0)
MCV: 86.7 fL (ref 80.0–100.0)
MONO ABS: 0.1 10*3/uL (ref 0.1–1.0)
Monocytes Relative: 5 %
NEUTROS ABS: 1.3 10*3/uL — AB (ref 1.7–7.7)
Neutrophils Relative %: 82 %
Platelets: 107 10*3/uL — ABNORMAL LOW (ref 150–400)
RBC: 3.08 MIL/uL — ABNORMAL LOW (ref 3.87–5.11)
RDW: 15.9 % — ABNORMAL HIGH (ref 11.5–15.5)
WBC: 1.5 10*3/uL — ABNORMAL LOW (ref 4.0–10.5)
nRBC: 0 % (ref 0.0–0.2)

## 2018-02-20 LAB — PHOSPHORUS: Phosphorus: 3.6 mg/dL (ref 2.5–4.6)

## 2018-02-20 LAB — MAGNESIUM: Magnesium: 1.4 mg/dL — ABNORMAL LOW (ref 1.7–2.4)

## 2018-02-20 LAB — URIC ACID: URIC ACID, SERUM: 4.1 mg/dL (ref 2.5–7.1)

## 2018-02-20 MED ORDER — SODIUM CHLORIDE 0.9 % IV SOLN
INTRAVENOUS | Status: DC
  Administered 2018-02-20: 11:00:00 via INTRAVENOUS

## 2018-02-20 MED ORDER — PEGFILGRASTIM 6 MG/0.6ML ~~LOC~~ PSKT
6.0000 mg | PREFILLED_SYRINGE | Freq: Once | SUBCUTANEOUS | Status: AC
  Administered 2018-02-20: 6 mg via SUBCUTANEOUS

## 2018-02-20 MED ORDER — SODIUM CHLORIDE 0.9 % IV SOLN
10.0000 mg | Freq: Once | INTRAVENOUS | Status: AC
  Administered 2018-02-20: 10 mg via INTRAVENOUS
  Filled 2018-02-20: qty 10

## 2018-02-20 MED ORDER — PEGFILGRASTIM 6 MG/0.6ML ~~LOC~~ PSKT
PREFILLED_SYRINGE | SUBCUTANEOUS | Status: AC
  Filled 2018-02-20: qty 0.6

## 2018-02-20 MED ORDER — SODIUM CHLORIDE 0.9 % IV SOLN
100.0000 mg/m2 | Freq: Once | INTRAVENOUS | Status: AC
  Administered 2018-02-20: 150 mg via INTRAVENOUS
  Filled 2018-02-20: qty 7.5

## 2018-02-20 MED ORDER — MAGNESIUM OXIDE 400 (241.3 MG) MG PO TABS: 400.0000 mg | ORAL_TABLET | Freq: Two times a day (BID) | ORAL | 2 refills | Status: DC

## 2018-02-20 MED ORDER — HEPARIN SOD (PORK) LOCK FLUSH 100 UNIT/ML IV SOLN
500.0000 [IU] | Freq: Once | INTRAVENOUS | Status: AC
  Administered 2018-02-20: 500 [IU] via INTRAVENOUS

## 2018-02-20 NOTE — Progress Notes (Signed)
Pt presents today for Day 3 Cycle 2 Etoposide. Labs drawn with from PICC line prior to treatment. VSS. Pt has no complaints of any significant changes since last treatment.    Treatment given today per MD orders. Tolerated infusion without adverse affects. Vital signs stable. No complaints at this time. Neulasta On-Pro kit placed and green light on. Pt teaching performed. Understanding verbalized.  Discharged from clinic ambulatory. F/U with [facility] as scheduled.

## 2018-02-20 NOTE — Telephone Encounter (Signed)
Called patient to let her know the doctor called in a script for magnesium, that her magnesium was low and to start it today and we will recheck it on Monday.

## 2018-02-20 NOTE — Patient Instructions (Signed)
[facility] Discharge Instructions for Patients Receiving Chemotherapy  Today you received the following chemotherapy agents   To help prevent nausea and vomiting after your treatment, we encourage you to take your nausea medication   If you develop nausea and vomiting that is not controlled by your nausea medication, call the clinic.   BELOW ARE SYMPTOMS THAT SHOULD BE REPORTED IMMEDIATELY:  *FEVER GREATER THAN 100.5 F  *CHILLS WITH OR WITHOUT FEVER  NAUSEA AND VOMITING THAT IS NOT CONTROLLED WITH YOUR NAUSEA MEDICATION  *UNUSUAL SHORTNESS OF BREATH  *UNUSUAL BRUISING OR BLEEDING  TENDERNESS IN MOUTH AND THROAT WITH OR WITHOUT PRESENCE OF ULCERS  *URINARY PROBLEMS  *BOWEL PROBLEMS  UNUSUAL RASH Items with * indicate a potential emergency and should be followed up as soon as possible.  Feel free to call the clinic should you have any questions or concerns. The clinic phone number is [phone].  Please show the CHEMO ALERT CARD at check-in to the Emergency Department and triage nurse.   

## 2018-02-23 ENCOUNTER — Inpatient Hospital Stay (HOSPITAL_COMMUNITY): Payer: Commercial Managed Care - PPO

## 2018-02-23 ENCOUNTER — Encounter (HOSPITAL_COMMUNITY): Payer: Self-pay

## 2018-02-23 DIAGNOSIS — C8447 Peripheral T-cell lymphoma, not classified, spleen: Secondary | ICD-10-CM | POA: Diagnosis not present

## 2018-02-23 LAB — COMPREHENSIVE METABOLIC PANEL
ALT: 15 U/L (ref 0–44)
AST: 15 U/L (ref 15–41)
Albumin: 3.5 g/dL (ref 3.5–5.0)
Alkaline Phosphatase: 73 U/L (ref 38–126)
Anion gap: 11 (ref 5–15)
BUN: 38 mg/dL — ABNORMAL HIGH (ref 6–20)
CO2: 23 mmol/L (ref 22–32)
Calcium: 8.9 mg/dL (ref 8.9–10.3)
Chloride: 103 mmol/L (ref 98–111)
Creatinine, Ser: 0.85 mg/dL (ref 0.44–1.00)
GFR calc Af Amer: >60 mL/min (ref >60)
GFR calc non Af Amer: >60 mL/min (ref >60)
Glucose, Bld: 169 mg/dL — ABNORMAL HIGH (ref 70–99)
Potassium: 3.8 mmol/L (ref 3.5–5.1)
Sodium: 137 mmol/L (ref 135–145)
TOTAL PROTEIN: 5.2 g/dL — AB (ref 6.5–8.1)
Total Bilirubin: 0.8 mg/dL (ref 0.3–1.2)

## 2018-02-23 LAB — CBC WITH DIFFERENTIAL/PLATELET
Abs Immature Granulocytes: 0.24 10*3/uL — ABNORMAL HIGH (ref 0.00–0.07)
Basophils Absolute: 0 10*3/uL (ref 0.0–0.1)
Basophils Relative: 1 %
Eosinophils Absolute: 0 10*3/uL (ref 0.0–0.5)
Eosinophils Relative: 0 %
HCT: 28.8 % — ABNORMAL LOW (ref 36.0–46.0)
Hemoglobin: 9.5 g/dL — ABNORMAL LOW (ref 12.0–15.0)
Immature Granulocytes: 8 %
Lymphocytes Relative: 21 %
Lymphs Abs: 0.7 10*3/uL (ref 0.7–4.0)
MCH: 28.7 pg (ref 26.0–34.0)
MCHC: 33 g/dL (ref 30.0–36.0)
MCV: 87 fL (ref 80.0–100.0)
MONO ABS: 0 10*3/uL — AB (ref 0.1–1.0)
Monocytes Relative: 0 %
Neutro Abs: 2.2 10*3/uL (ref 1.7–7.7)
Neutrophils Relative %: 70 %
Platelets: 129 10*3/uL — ABNORMAL LOW (ref 150–400)
RBC: 3.31 MIL/uL — ABNORMAL LOW (ref 3.87–5.11)
RDW: 15.4 % (ref 11.5–15.5)
WBC: 3.2 10*3/uL — ABNORMAL LOW (ref 4.0–10.5)
nRBC: 0 % (ref 0.0–0.2)

## 2018-02-23 LAB — SAMPLE TO BLOOD BANK

## 2018-02-23 LAB — MAGNESIUM: Magnesium: 1.9 mg/dL (ref 1.7–2.4)

## 2018-02-23 LAB — PHOSPHORUS: Phosphorus: 3.6 mg/dL (ref 2.5–4.6)

## 2018-02-23 LAB — URIC ACID: Uric Acid, Serum: 3.1 mg/dL (ref 2.5–7.1)

## 2018-02-23 MED ORDER — SODIUM CHLORIDE 0.9% FLUSH
10.0000 mL | Freq: Once | INTRAVENOUS | Status: AC
  Administered 2018-02-23: 10 mL via INTRAVENOUS

## 2018-02-23 MED ORDER — HEPARIN SOD (PORK) LOCK FLUSH 100 UNIT/ML IV SOLN
300.0000 [IU] | Freq: Once | INTRAVENOUS | Status: AC
  Administered 2018-02-23: 300 [IU] via INTRAVENOUS

## 2018-02-23 NOTE — Patient Instructions (Signed)
[facility]  Discharge Instructions:   _______________________________________________________________  Thank you for choosing [facility] at [facility] to provide your oncology and hematology care.  To afford each patient quality time with our providers, please arrive at least 15 minutes before your scheduled appointment.  You need to re-schedule your appointment if you arrive 10 or more minutes late.  We strive to give you quality time with our providers, and arriving late affects you and other patients whose appointments are after yours.  Also, if you no show three or more times for appointments you may be dismissed from the clinic.  Again, thank you for choosing [facility]. Our hope is that these requests will allow you access to exceptional care and in a timely manner. _______________________________________________________________  If you have questions after your visit, please contact our office at [phone] between the hours of 8:30 a.m. and 5:00 p.m. Voicemails left after 4:30 p.m. will not be returned until the following business day. _______________________________________________________________  For prescription refill requests, have your pharmacy contact our office. _______________________________________________________________  Recommendations made by the consultant and any test results will be sent to your referring physician. _______________________________________________________________ 

## 2018-02-23 NOTE — Progress Notes (Signed)
Labs drawn today per orders. Picc line flushed per parameters.   Labs reviewed by MD. No new orders at this time. Vitals stable and discharged home from clinic ambulatory. Follow up as scheduled.

## 2018-02-26 ENCOUNTER — Encounter (HOSPITAL_COMMUNITY): Payer: Self-pay

## 2018-02-26 ENCOUNTER — Inpatient Hospital Stay (HOSPITAL_COMMUNITY): Payer: Commercial Managed Care - PPO

## 2018-02-26 ENCOUNTER — Ambulatory Visit (HOSPITAL_COMMUNITY): Payer: Commercial Managed Care - PPO

## 2018-02-26 VITALS — BP 91/58 | HR 98 | Temp 99.5°F | Resp 18

## 2018-02-26 DIAGNOSIS — A778 Other spotted fevers: Secondary | ICD-10-CM

## 2018-02-26 DIAGNOSIS — C8447 Peripheral T-cell lymphoma, not classified, spleen: Secondary | ICD-10-CM | POA: Diagnosis not present

## 2018-02-26 DIAGNOSIS — E876 Hypokalemia: Secondary | ICD-10-CM

## 2018-02-26 LAB — COMPREHENSIVE METABOLIC PANEL
ALBUMIN: 3.4 g/dL — AB (ref 3.5–5.0)
ALT: 12 U/L (ref 0–44)
AST: 13 U/L — ABNORMAL LOW (ref 15–41)
Alkaline Phosphatase: 67 U/L (ref 38–126)
Anion gap: 10 (ref 5–15)
BUN: 27 mg/dL — ABNORMAL HIGH (ref 6–20)
CO2: 22 mmol/L (ref 22–32)
Calcium: 8.7 mg/dL — ABNORMAL LOW (ref 8.9–10.3)
Chloride: 104 mmol/L (ref 98–111)
Creatinine, Ser: 0.8 mg/dL (ref 0.44–1.00)
GFR calc Af Amer: >60 mL/min (ref >60)
GFR calc non Af Amer: >60 mL/min (ref >60)
GLUCOSE: 279 mg/dL — AB (ref 70–99)
Potassium: 4.3 mmol/L (ref 3.5–5.1)
SODIUM: 136 mmol/L (ref 135–145)
Total Bilirubin: 0.8 mg/dL (ref 0.3–1.2)
Total Protein: 5.3 g/dL — ABNORMAL LOW (ref 6.5–8.1)

## 2018-02-26 LAB — CBC WITH DIFFERENTIAL/PLATELET
Abs Immature Granulocytes: 0 10*3/uL (ref 0.00–0.07)
Basophils Absolute: 0 10*3/uL (ref 0.0–0.1)
Basophils Relative: 0 %
Eosinophils Absolute: 0 10*3/uL (ref 0.0–0.5)
Eosinophils Relative: 0 %
HCT: 27.1 % — ABNORMAL LOW (ref 36.0–46.0)
Hemoglobin: 8.9 g/dL — ABNORMAL LOW (ref 12.0–15.0)
Immature Granulocytes: 0 %
LYMPHS ABS: 0.2 10*3/uL — AB (ref 0.7–4.0)
Lymphocytes Relative: 90 %
MCH: 28.8 pg (ref 26.0–34.0)
MCHC: 32.8 g/dL (ref 30.0–36.0)
MCV: 87.7 fL (ref 80.0–100.0)
Monocytes Absolute: 0 10*3/uL — ABNORMAL LOW (ref 0.1–1.0)
Monocytes Relative: 5 %
Neutro Abs: 0 10*3/uL — ABNORMAL LOW (ref 1.7–7.7)
Neutrophils Relative %: 5 %
Platelets: 46 10*3/uL — ABNORMAL LOW (ref 150–400)
RBC: 3.09 MIL/uL — ABNORMAL LOW (ref 3.87–5.11)
RDW: 15.2 % (ref 11.5–15.5)
WBC: 0.2 10*3/uL — CL (ref 4.0–10.5)
nRBC: 0 % (ref 0.0–0.2)

## 2018-02-26 LAB — MAGNESIUM: Magnesium: 1.4 mg/dL — ABNORMAL LOW (ref 1.7–2.4)

## 2018-02-26 LAB — PHOSPHORUS: Phosphorus: 4 mg/dL (ref 2.5–4.6)

## 2018-02-26 LAB — SAMPLE TO BLOOD BANK

## 2018-02-26 LAB — URIC ACID: Uric Acid, Serum: 2.6 mg/dL (ref 2.5–7.1)

## 2018-02-26 MED ORDER — HEPARIN SOD (PORK) LOCK FLUSH 100 UNIT/ML IV SOLN
500.0000 [IU] | Freq: Once | INTRAVENOUS | Status: AC
  Administered 2018-02-26: 500 [IU] via INTRAVENOUS
  Filled 2018-02-26: qty 5

## 2018-02-26 MED ORDER — SODIUM CHLORIDE 0.9 % IV SOLN
Freq: Once | INTRAVENOUS | Status: AC
  Administered 2018-02-26: 10:00:00 via INTRAVENOUS

## 2018-02-26 MED ORDER — MAGNESIUM SULFATE 2 GM/50ML IV SOLN
2.0000 g | Freq: Once | INTRAVENOUS | Status: AC
  Administered 2018-02-26: 2 g via INTRAVENOUS
  Filled 2018-02-26: qty 50

## 2018-02-26 MED ORDER — SODIUM CHLORIDE 0.9% FLUSH
10.0000 mL | Freq: Once | INTRAVENOUS | Status: AC
  Administered 2018-02-26: 10 mL via INTRAVENOUS

## 2018-02-26 NOTE — Progress Notes (Signed)
To treatment room for lab and PICC line dressing change.  Insertion site clean and dry with no redness or tenderness noted.  Flushed easily with good blood return.  Dressing change with sterile technique.  No complaints of pain with dressing change.  Resting with no s/s of distress noted.  Patient denied any fevers or chills.    Reviewed lab Magnesium 1.4 with Dr. Delton Coombes with verbal order Magnesium 2grams IV verbal order.  Pharmacy notified.   Patient's temperature 99.5 post Magnesium IV.  Dr. Delton Coombes notified with verbal order blood cultures to be done by lab.  Patient also instructed to call us if temperature rises to 100.5 or higher and go to the emergency room.  Patient verbalized understanding.  Patient denied fevers or chills today or days before today.  No s/s of distress noted.  Patient left ambulatory with no s/s of distress noted.

## 2018-02-26 NOTE — Patient Instructions (Signed)
[facility] at [facility]  Discharge Instructions:   _______________________________________________________________  Thank you for choosing [facility] at [facility] to provide your oncology and hematology care.  To afford each patient quality time with our providers, please arrive at least 15 minutes before your scheduled appointment.  You need to re-schedule your appointment if you arrive 10 or more minutes late.  We strive to give you quality time with our providers, and arriving late affects you and other patients whose appointments are after yours.  Also, if you no show three or more times for appointments you may be dismissed from the clinic.  Again, thank you for choosing [facility] at [facility]. Our hope is that these requests will allow you access to exceptional care and in a timely manner. _______________________________________________________________  If you have questions after your visit, please contact our office at [phone] between the hours of 8:30 a.m. and 5:00 p.m. Voicemails left after 4:30 p.m. will not be returned until the following business day. _______________________________________________________________  For prescription refill requests, have your pharmacy contact our office. _______________________________________________________________  Recommendations made by the consultant and any test results will be sent to your referring physician. _______________________________________________________________ 

## 2018-02-26 NOTE — Progress Notes (Signed)
CRITICAL VALUE ALERT  Critical Value:  WBC 0.2  Date & Time Notied:  02/26/2018 at [place]  Provider Notified: Dr. Delton Coombes  Orders Received/Actions taken: n/a

## 2018-03-02 ENCOUNTER — Encounter (HOSPITAL_COMMUNITY): Payer: Self-pay

## 2018-03-02 ENCOUNTER — Inpatient Hospital Stay (HOSPITAL_COMMUNITY): Payer: Commercial Managed Care - PPO

## 2018-03-02 DIAGNOSIS — T451X5A Adverse effect of antineoplastic and immunosuppressive drugs, initial encounter: Secondary | ICD-10-CM

## 2018-03-02 DIAGNOSIS — C8447 Peripheral T-cell lymphoma, not classified, spleen: Secondary | ICD-10-CM | POA: Diagnosis not present

## 2018-03-02 DIAGNOSIS — D6481 Anemia due to antineoplastic chemotherapy: Secondary | ICD-10-CM

## 2018-03-02 LAB — COMPREHENSIVE METABOLIC PANEL
ALT: 20 U/L (ref 0–44)
AST: 16 U/L (ref 15–41)
Albumin: 3.4 g/dL — ABNORMAL LOW (ref 3.5–5.0)
Alkaline Phosphatase: 84 U/L (ref 38–126)
Anion gap: 9 (ref 5–15)
BUN: 20 mg/dL (ref 6–20)
CALCIUM: 8.8 mg/dL — AB (ref 8.9–10.3)
CO2: 18 mmol/L — ABNORMAL LOW (ref 22–32)
Chloride: 111 mmol/L (ref 98–111)
Creatinine, Ser: 0.85 mg/dL (ref 0.44–1.00)
GFR calc non Af Amer: >60 mL/min (ref >60)
Glucose, Bld: 208 mg/dL — ABNORMAL HIGH (ref 70–99)
Potassium: 3.5 mmol/L (ref 3.5–5.1)
Sodium: 138 mmol/L (ref 135–145)
Total Bilirubin: 0.7 mg/dL (ref 0.3–1.2)
Total Protein: 5.6 g/dL — ABNORMAL LOW (ref 6.5–8.1)

## 2018-03-02 LAB — CBC WITH DIFFERENTIAL/PLATELET
Abs Immature Granulocytes: 0.02 10*3/uL (ref 0.00–0.07)
Basophils Absolute: 0 10*3/uL (ref 0.0–0.1)
Basophils Relative: 1 %
EOS ABS: 0 10*3/uL (ref 0.0–0.5)
Eosinophils Relative: 1 %
HCT: 22.1 % — ABNORMAL LOW (ref 36.0–46.0)
Hemoglobin: 6.9 g/dL — CL (ref 12.0–15.0)
Immature Granulocytes: 2 %
Lymphocytes Relative: 32 %
Lymphs Abs: 0.4 10*3/uL — ABNORMAL LOW (ref 0.7–4.0)
MCH: 27.4 pg (ref 26.0–34.0)
MCHC: 31.2 g/dL (ref 30.0–36.0)
MCV: 87.7 fL (ref 80.0–100.0)
Monocytes Absolute: 0.1 10*3/uL (ref 0.1–1.0)
Monocytes Relative: 10 %
Neutro Abs: 0.7 10*3/uL — ABNORMAL LOW (ref 1.7–7.7)
Neutrophils Relative %: 54 %
Platelets: 21 10*3/uL — CL (ref 150–400)
RBC: 2.52 MIL/uL — ABNORMAL LOW (ref 3.87–5.11)
RDW: 15.1 % (ref 11.5–15.5)
WBC: 1.3 10*3/uL — CL (ref 4.0–10.5)
nRBC: 0 % (ref 0.0–0.2)

## 2018-03-02 LAB — PHOSPHORUS: Phosphorus: 3.4 mg/dL (ref 2.5–4.6)

## 2018-03-02 LAB — SAMPLE TO BLOOD BANK

## 2018-03-02 LAB — MAGNESIUM: Magnesium: 1.6 mg/dL — ABNORMAL LOW (ref 1.7–2.4)

## 2018-03-02 LAB — PREPARE RBC (CROSSMATCH)

## 2018-03-02 LAB — URIC ACID: Uric Acid, Serum: 4.1 mg/dL (ref 2.5–7.1)

## 2018-03-02 MED ORDER — MAGNESIUM SULFATE 2 GM/50ML IV SOLN
2.0000 g | Freq: Once | INTRAVENOUS | Status: AC
  Administered 2018-03-02: 2 g via INTRAVENOUS
  Filled 2018-03-02: qty 50

## 2018-03-02 MED ORDER — SODIUM CHLORIDE 0.9 % IV SOLN
INTRAVENOUS | Status: DC
  Administered 2018-03-02: 10:00:00 via INTRAVENOUS

## 2018-03-02 MED ORDER — SODIUM CHLORIDE 0.9% IV SOLUTION
250.0000 mL | Freq: Once | INTRAVENOUS | Status: AC
  Administered 2018-03-02: 250 mL via INTRAVENOUS

## 2018-03-02 MED ORDER — ACETAMINOPHEN 325 MG PO TABS
650.0000 mg | ORAL_TABLET | Freq: Once | ORAL | Status: AC
  Administered 2018-03-02: 650 mg via ORAL
  Filled 2018-03-02: qty 2

## 2018-03-02 MED ORDER — DIPHENHYDRAMINE HCL 25 MG PO CAPS
25.0000 mg | ORAL_CAPSULE | Freq: Once | ORAL | Status: AC
  Administered 2018-03-02: 25 mg via ORAL
  Filled 2018-03-02: qty 1

## 2018-03-02 MED ORDER — SODIUM CHLORIDE 0.9% FLUSH
10.0000 mL | INTRAVENOUS | Status: AC | PRN
  Administered 2018-03-02: 10 mL

## 2018-03-02 MED ORDER — LOPERAMIDE HCL 2 MG PO CAPS
2.0000 mg | ORAL_CAPSULE | Freq: Once | ORAL | Status: AC
  Administered 2018-03-02: 2 mg via ORAL
  Filled 2018-03-02 (×2): qty 1

## 2018-03-02 MED ORDER — HEPARIN SOD (PORK) LOCK FLUSH 100 UNIT/ML IV SOLN
300.0000 [IU] | Freq: Once | INTRAVENOUS | Status: AC
  Administered 2018-03-02: 300 [IU] via INTRAVENOUS

## 2018-03-02 NOTE — Progress Notes (Signed)
Labs reviewed with Dr. Delton Coombes today. Will give one unit of blood per orders, will given magnesium per orders as well.    Patient tolerated it well without problems. Vitals stable and discharged home from clinic ambulatory. Follow up as scheduled.

## 2018-03-02 NOTE — Progress Notes (Signed)
CRITICAL VALUE ALERT  Critical Value:  WBC 1.3; hgb 6.9; platelets 21  Date & Time Notied:  03/02/2018 at [place]  Provider Notified: Dr. Delton Coombes  Orders Received/Actions taken: transfuse 1 unit PRBC

## 2018-03-02 NOTE — Patient Instructions (Signed)
[facility] at [facility]  Discharge Instructions:   _______________________________________________________________  Thank you for choosing [facility] at [facility] to provide your oncology and hematology care.  To afford each patient quality time with our providers, please arrive at least 15 minutes before your scheduled appointment.  You need to re-schedule your appointment if you arrive 10 or more minutes late.  We strive to give you quality time with our providers, and arriving late affects you and other patients whose appointments are after yours.  Also, if you no show three or more times for appointments you may be dismissed from the clinic.  Again, thank you for choosing [facility] Our hope is that these requests will allow you access to exceptional care and in a timely manner. _______________________________________________________________  If you have questions after your visit, please contact our office at [phone] between the hours of 8:30 a.m. and 5:00 p.m. Voicemails left after 4:30 p.m. will not be returned until the following business day. _______________________________________________________________  For prescription refill requests, have your pharmacy contact our office. _______________________________________________________________  Recommendations made by the consultant and any test results will be sent to your referring physician. _______________________________________________________________ 

## 2018-03-03 LAB — CULTURE, BLOOD (ROUTINE X 2)
Culture: NO GROWTH
Culture: NO GROWTH
Special Requests: ADEQUATE
Special Requests: ADEQUATE

## 2018-03-05 ENCOUNTER — Encounter (HOSPITAL_COMMUNITY): Payer: Self-pay

## 2018-03-05 ENCOUNTER — Inpatient Hospital Stay (HOSPITAL_COMMUNITY): Payer: Commercial Managed Care - PPO

## 2018-03-05 DIAGNOSIS — C8447 Peripheral T-cell lymphoma, not classified, spleen: Secondary | ICD-10-CM | POA: Diagnosis not present

## 2018-03-05 LAB — CBC WITH DIFFERENTIAL/PLATELET
ABS IMMATURE GRANULOCYTES: 0.2 10*3/uL — AB (ref 0.00–0.07)
Basophils Absolute: 0 10*3/uL (ref 0.0–0.1)
Basophils Relative: 1 %
Eosinophils Absolute: 0 10*3/uL (ref 0.0–0.5)
Eosinophils Relative: 1 %
HCT: 23.5 % — ABNORMAL LOW (ref 36.0–46.0)
Hemoglobin: 7.5 g/dL — ABNORMAL LOW (ref 12.0–15.0)
Immature Granulocytes: 10 %
Lymphocytes Relative: 12 %
Lymphs Abs: 0.2 10*3/uL — ABNORMAL LOW (ref 0.7–4.0)
MCH: 28 pg (ref 26.0–34.0)
MCHC: 31.9 g/dL (ref 30.0–36.0)
MCV: 87.7 fL (ref 80.0–100.0)
Monocytes Absolute: 0.3 10*3/uL (ref 0.1–1.0)
Monocytes Relative: 15 %
NEUTROS ABS: 1.2 10*3/uL — AB (ref 1.7–7.7)
Neutrophils Relative %: 61 %
Platelets: 59 10*3/uL — ABNORMAL LOW (ref 150–400)
RBC: 2.68 MIL/uL — ABNORMAL LOW (ref 3.87–5.11)
RDW: 15.5 % (ref 11.5–15.5)
WBC: 2 10*3/uL — ABNORMAL LOW (ref 4.0–10.5)
nRBC: 1 % — ABNORMAL HIGH (ref 0.0–0.2)

## 2018-03-05 LAB — COMPREHENSIVE METABOLIC PANEL
ALT: 17 U/L (ref 0–44)
AST: 14 U/L — ABNORMAL LOW (ref 15–41)
Albumin: 3.5 g/dL (ref 3.5–5.0)
Alkaline Phosphatase: 82 U/L (ref 38–126)
Anion gap: 8 (ref 5–15)
BUN: 15 mg/dL (ref 6–20)
CO2: 20 mmol/L — ABNORMAL LOW (ref 22–32)
Calcium: 8.7 mg/dL — ABNORMAL LOW (ref 8.9–10.3)
Chloride: 112 mmol/L — ABNORMAL HIGH (ref 98–111)
Creatinine, Ser: 0.65 mg/dL (ref 0.44–1.00)
GFR calc non Af Amer: >60 mL/min (ref >60)
Glucose, Bld: 191 mg/dL — ABNORMAL HIGH (ref 70–99)
POTASSIUM: 4 mmol/L (ref 3.5–5.1)
Sodium: 140 mmol/L (ref 135–145)
Total Bilirubin: 0.6 mg/dL (ref 0.3–1.2)
Total Protein: 5.6 g/dL — ABNORMAL LOW (ref 6.5–8.1)

## 2018-03-05 LAB — TYPE AND SCREEN
ABO/RH(D): B POS
Antibody Screen: NEGATIVE
Unit division: 0
Unit division: 0

## 2018-03-05 LAB — BPAM RBC
Blood Product Expiration Date: 202003032359
Blood Product Expiration Date: 202003032359
ISSUE DATE / TIME: 202002171300
UNIT TYPE AND RH: 5100
Unit Type and Rh: 5100

## 2018-03-05 LAB — PREPARE RBC (CROSSMATCH)

## 2018-03-05 LAB — PHOSPHORUS: Phosphorus: 3.8 mg/dL (ref 2.5–4.6)

## 2018-03-05 LAB — MAGNESIUM: Magnesium: 1.6 mg/dL — ABNORMAL LOW (ref 1.7–2.4)

## 2018-03-05 LAB — URIC ACID: Uric Acid, Serum: 3.6 mg/dL (ref 2.5–7.1)

## 2018-03-05 LAB — SAMPLE TO BLOOD BANK

## 2018-03-05 MED ORDER — MAGNESIUM SULFATE 2 GM/50ML IV SOLN
2.0000 g | Freq: Once | INTRAVENOUS | Status: AC
  Administered 2018-03-05: 2 g via INTRAVENOUS
  Filled 2018-03-05: qty 50

## 2018-03-05 MED ORDER — HEPARIN SOD (PORK) LOCK FLUSH 100 UNIT/ML IV SOLN
INTRAVENOUS | Status: AC
  Filled 2018-03-05: qty 5

## 2018-03-05 MED ORDER — SODIUM CHLORIDE 0.9% IV SOLUTION
250.0000 mL | Freq: Once | INTRAVENOUS | Status: AC
  Administered 2018-03-05: 250 mL via INTRAVENOUS

## 2018-03-05 MED ORDER — SODIUM CHLORIDE 0.9% FLUSH
10.0000 mL | INTRAVENOUS | Status: AC | PRN
  Administered 2018-03-05: 10 mL

## 2018-03-05 MED ORDER — DIPHENHYDRAMINE HCL 25 MG PO CAPS
25.0000 mg | ORAL_CAPSULE | Freq: Once | ORAL | Status: AC
  Administered 2018-03-05: 25 mg via ORAL
  Filled 2018-03-05: qty 1

## 2018-03-05 MED ORDER — HEPARIN SOD (PORK) LOCK FLUSH 100 UNIT/ML IV SOLN
250.0000 [IU] | INTRAVENOUS | Status: AC | PRN
  Administered 2018-03-05: 14:00:00
  Filled 2018-03-05: qty 5

## 2018-03-05 MED ORDER — SODIUM CHLORIDE 0.9 % IV SOLN
INTRAVENOUS | Status: DC
  Administered 2018-03-05: 11:00:00 via INTRAVENOUS

## 2018-03-05 MED ORDER — ACETAMINOPHEN 325 MG PO TABS
650.0000 mg | ORAL_TABLET | Freq: Once | ORAL | Status: AC
  Administered 2018-03-05: 650 mg via ORAL
  Filled 2018-03-05: qty 2

## 2018-03-05 NOTE — Patient Instructions (Signed)
[place] at [facility] Discharge Instructions  Received Magnesium infusion and blood transfusion today. Follow-up as scheduled. Call clinic for any questions or concerns   Thank you for choosing [place] at [facility] to provide your oncology and hematology care.  To afford each patient quality time with our provider, please arrive at least 15 minutes before your scheduled appointment time.   If you have a lab appointment with the [facility] please come in thru the  Main Entrance and check in at the main information desk  You need to re-schedule your appointment should you arrive 10 or more minutes late.  We strive to give you quality time with our providers, and arriving late affects you and other patients whose appointments are after yours.  Also, if you no show three or more times for appointments you may be dismissed from the clinic at the providers discretion.     Again, thank you for choosing [facility].  Our hope is that these requests will decrease the amount of time that you wait before being seen by our physicians.       _____________________________________________________________  Should you have questions after your visit to [facility], please contact our office at [phone] between the hours of 8:00 a.m. and 4:30 p.m.  Voicemails left after 4:00 p.m. will not be returned until the following business day.  For prescription refill requests, have your pharmacy contact our office and allow 72 hours.    Cancer Center Support Programs:   > Cancer Support Group  2nd Tuesday of the month 1pm-2pm, Journey Room

## 2018-03-05 NOTE — Progress Notes (Signed)
One unit of blood given today per orders.   Treatment given per orders. Patient tolerated it well without problems. Vitals stable and discharged home from clinic ambulatory. Follow up as scheduled.  

## 2018-03-05 NOTE — Progress Notes (Signed)
PICC line dressing and cap changed per protocol with PICC site without redness or drainage.

## 2018-03-06 ENCOUNTER — Other Ambulatory Visit (HOSPITAL_COMMUNITY): Payer: Self-pay | Admitting: Nurse Practitioner

## 2018-03-06 LAB — BPAM RBC
Blood Product Expiration Date: 202003032359
ISSUE DATE / TIME: 202002201237
Unit Type and Rh: 5100

## 2018-03-06 LAB — TYPE AND SCREEN
ABO/RH(D): B POS
Antibody Screen: NEGATIVE
Unit division: 0

## 2018-03-09 ENCOUNTER — Inpatient Hospital Stay (HOSPITAL_COMMUNITY): Payer: Commercial Managed Care - PPO

## 2018-03-09 ENCOUNTER — Encounter (HOSPITAL_COMMUNITY): Payer: Self-pay

## 2018-03-09 DIAGNOSIS — C8447 Peripheral T-cell lymphoma, not classified, spleen: Secondary | ICD-10-CM | POA: Diagnosis not present

## 2018-03-09 LAB — COMPREHENSIVE METABOLIC PANEL
ALK PHOS: 110 U/L (ref 38–126)
ALT: 20 U/L (ref 0–44)
AST: 15 U/L (ref 15–41)
Albumin: 3.7 g/dL (ref 3.5–5.0)
Anion gap: 8 (ref 5–15)
BUN: 19 mg/dL (ref 6–20)
CO2: 24 mmol/L (ref 22–32)
CREATININE: 0.69 mg/dL (ref 0.44–1.00)
Calcium: 8.7 mg/dL — ABNORMAL LOW (ref 8.9–10.3)
Chloride: 105 mmol/L (ref 98–111)
GFR calc Af Amer: >60 mL/min (ref >60)
GFR calc non Af Amer: >60 mL/min (ref >60)
Glucose, Bld: 181 mg/dL — ABNORMAL HIGH (ref 70–99)
Potassium: 4.3 mmol/L (ref 3.5–5.1)
Sodium: 137 mmol/L (ref 135–145)
Total Bilirubin: 0.8 mg/dL (ref 0.3–1.2)
Total Protein: 5.7 g/dL — ABNORMAL LOW (ref 6.5–8.1)

## 2018-03-09 LAB — PHOSPHORUS: Phosphorus: 4.1 mg/dL (ref 2.5–4.6)

## 2018-03-09 LAB — CBC WITH DIFFERENTIAL/PLATELET
Abs Immature Granulocytes: 0.15 10*3/uL — ABNORMAL HIGH (ref 0.00–0.07)
BASOS ABS: 0 10*3/uL (ref 0.0–0.1)
Basophils Relative: 1 %
Eosinophils Absolute: 0 10*3/uL (ref 0.0–0.5)
Eosinophils Relative: 0 %
HCT: 28.9 % — ABNORMAL LOW (ref 36.0–46.0)
Hemoglobin: 9 g/dL — ABNORMAL LOW (ref 12.0–15.0)
Immature Granulocytes: 5 %
Lymphocytes Relative: 10 %
Lymphs Abs: 0.3 10*3/uL — ABNORMAL LOW (ref 0.7–4.0)
MCH: 28.5 pg (ref 26.0–34.0)
MCHC: 31.1 g/dL (ref 30.0–36.0)
MCV: 91.5 fL (ref 80.0–100.0)
Monocytes Absolute: 0.3 10*3/uL (ref 0.1–1.0)
Monocytes Relative: 11 %
NEUTROS PCT: 73 %
NRBC: 0 % (ref 0.0–0.2)
Neutro Abs: 2.1 10*3/uL (ref 1.7–7.7)
Platelets: 118 10*3/uL — ABNORMAL LOW (ref 150–400)
RBC: 3.16 MIL/uL — ABNORMAL LOW (ref 3.87–5.11)
RDW: 18.5 % — ABNORMAL HIGH (ref 11.5–15.5)
WBC: 2.8 10*3/uL — ABNORMAL LOW (ref 4.0–10.5)

## 2018-03-09 LAB — URIC ACID: Uric Acid, Serum: 3.2 mg/dL (ref 2.5–7.1)

## 2018-03-09 LAB — MAGNESIUM: Magnesium: 1.9 mg/dL (ref 1.7–2.4)

## 2018-03-09 LAB — SAMPLE TO BLOOD BANK

## 2018-03-09 MED ORDER — SODIUM CHLORIDE 0.9% FLUSH
10.0000 mL | Freq: Once | INTRAVENOUS | Status: AC
  Administered 2018-03-09: 10 mL via INTRAVENOUS

## 2018-03-09 MED ORDER — HEPARIN SOD (PORK) LOCK FLUSH 100 UNIT/ML IV SOLN
500.0000 [IU] | Freq: Once | INTRAVENOUS | Status: AC
  Administered 2018-03-09: 500 [IU] via INTRAVENOUS

## 2018-03-09 MED ORDER — OCTREOTIDE ACETATE 30 MG IM KIT
PACK | INTRAMUSCULAR | Status: AC
  Filled 2018-03-09: qty 1

## 2018-03-09 NOTE — Progress Notes (Signed)
Labs reviewed with Dr. Walden Field today. No other orders at this time. Discharged home from clinic ambulatory. Follow up as scheduled.

## 2018-03-11 ENCOUNTER — Inpatient Hospital Stay (HOSPITAL_BASED_OUTPATIENT_CLINIC_OR_DEPARTMENT_OTHER): Payer: Commercial Managed Care - PPO | Admitting: Hematology

## 2018-03-11 ENCOUNTER — Inpatient Hospital Stay (HOSPITAL_COMMUNITY): Payer: Commercial Managed Care - PPO

## 2018-03-11 ENCOUNTER — Encounter (HOSPITAL_COMMUNITY): Payer: Self-pay

## 2018-03-11 ENCOUNTER — Encounter (HOSPITAL_COMMUNITY): Payer: Self-pay | Admitting: Hematology

## 2018-03-11 VITALS — BP 125/77 | HR 90 | Temp 97.6°F | Resp 18 | Wt 119.0 lb

## 2018-03-11 DIAGNOSIS — C8447 Peripheral T-cell lymphoma, not classified, spleen: Secondary | ICD-10-CM

## 2018-03-11 DIAGNOSIS — R161 Splenomegaly, not elsewhere classified: Secondary | ICD-10-CM

## 2018-03-11 DIAGNOSIS — D61818 Other pancytopenia: Secondary | ICD-10-CM | POA: Diagnosis not present

## 2018-03-11 DIAGNOSIS — E876 Hypokalemia: Secondary | ICD-10-CM

## 2018-03-11 DIAGNOSIS — Z79899 Other long term (current) drug therapy: Secondary | ICD-10-CM

## 2018-03-11 LAB — CBC WITH DIFFERENTIAL/PLATELET
Abs Immature Granulocytes: 0.09 10*3/uL — ABNORMAL HIGH (ref 0.00–0.07)
BASOS PCT: 1 %
Basophils Absolute: 0 10*3/uL (ref 0.0–0.1)
Eosinophils Absolute: 0 10*3/uL (ref 0.0–0.5)
Eosinophils Relative: 0 %
HCT: 30.7 % — ABNORMAL LOW (ref 36.0–46.0)
Hemoglobin: 9.4 g/dL — ABNORMAL LOW (ref 12.0–15.0)
Immature Granulocytes: 3 %
Lymphocytes Relative: 9 %
Lymphs Abs: 0.3 10*3/uL — ABNORMAL LOW (ref 0.7–4.0)
MCH: 28.6 pg (ref 26.0–34.0)
MCHC: 30.6 g/dL (ref 30.0–36.0)
MCV: 93.3 fL (ref 80.0–100.0)
Monocytes Absolute: 0.3 10*3/uL (ref 0.1–1.0)
Monocytes Relative: 8 %
Neutro Abs: 2.4 10*3/uL (ref 1.7–7.7)
Neutrophils Relative %: 79 %
Platelets: 132 10*3/uL — ABNORMAL LOW (ref 150–400)
RBC: 3.29 MIL/uL — ABNORMAL LOW (ref 3.87–5.11)
RDW: 19.7 % — ABNORMAL HIGH (ref 11.5–15.5)
WBC: 3.1 10*3/uL — ABNORMAL LOW (ref 4.0–10.5)
nRBC: 0 % (ref 0.0–0.2)

## 2018-03-11 LAB — COMPREHENSIVE METABOLIC PANEL
ALT: 15 U/L (ref 0–44)
AST: 15 U/L (ref 15–41)
Albumin: 3.9 g/dL (ref 3.5–5.0)
Alkaline Phosphatase: 103 U/L (ref 38–126)
Anion gap: 10 (ref 5–15)
BUN: 22 mg/dL — ABNORMAL HIGH (ref 6–20)
CO2: 24 mmol/L (ref 22–32)
CREATININE: 0.76 mg/dL (ref 0.44–1.00)
Calcium: 9.3 mg/dL (ref 8.9–10.3)
Chloride: 104 mmol/L (ref 98–111)
GFR calc non Af Amer: >60 mL/min (ref >60)
Glucose, Bld: 195 mg/dL — ABNORMAL HIGH (ref 70–99)
Potassium: 5 mmol/L (ref 3.5–5.1)
Sodium: 138 mmol/L (ref 135–145)
Total Bilirubin: 0.7 mg/dL (ref 0.3–1.2)
Total Protein: 6.1 g/dL — ABNORMAL LOW (ref 6.5–8.1)

## 2018-03-11 LAB — MAGNESIUM: MAGNESIUM: 2 mg/dL (ref 1.7–2.4)

## 2018-03-11 LAB — LACTATE DEHYDROGENASE: LDH: 176 U/L (ref 98–192)

## 2018-03-11 MED ORDER — SODIUM CHLORIDE 0.9 % IV SOLN
Freq: Once | INTRAVENOUS | Status: AC
  Administered 2018-03-11: 10:00:00 via INTRAVENOUS

## 2018-03-11 MED ORDER — SODIUM CHLORIDE 0.9 % IV SOLN
100.0000 mg/m2 | Freq: Once | INTRAVENOUS | Status: AC
  Administered 2018-03-11: 150 mg via INTRAVENOUS
  Filled 2018-03-11: qty 7.5

## 2018-03-11 MED ORDER — SODIUM CHLORIDE 0.9 % IV SOLN
750.0000 mg/m2 | Freq: Once | INTRAVENOUS | Status: AC
  Administered 2018-03-11: 1140 mg via INTRAVENOUS
  Filled 2018-03-11: qty 25

## 2018-03-11 MED ORDER — PALONOSETRON HCL INJECTION 0.25 MG/5ML
0.2500 mg | Freq: Once | INTRAVENOUS | Status: AC
  Administered 2018-03-11: 0.25 mg via INTRAVENOUS

## 2018-03-11 MED ORDER — SODIUM CHLORIDE 0.9 % IV SOLN
Freq: Once | INTRAVENOUS | Status: AC
  Administered 2018-03-11: 11:00:00 via INTRAVENOUS
  Filled 2018-03-11: qty 5

## 2018-03-11 MED ORDER — HEPARIN SOD (PORK) LOCK FLUSH 100 UNIT/ML IV SOLN
250.0000 [IU] | Freq: Once | INTRAVENOUS | Status: AC | PRN
  Administered 2018-03-11: 250 [IU]

## 2018-03-11 MED ORDER — PALONOSETRON HCL INJECTION 0.25 MG/5ML
INTRAVENOUS | Status: AC
  Filled 2018-03-11: qty 5

## 2018-03-11 MED ORDER — DOXORUBICIN HCL CHEMO IV INJECTION 2 MG/ML
50.0000 mg/m2 | Freq: Once | INTRAVENOUS | Status: AC
  Administered 2018-03-11: 76 mg via INTRAVENOUS
  Filled 2018-03-11: qty 38

## 2018-03-11 MED ORDER — SODIUM CHLORIDE 0.9% FLUSH
3.0000 mL | INTRAVENOUS | Status: DC | PRN
  Administered 2018-03-11: 3 mL
  Filled 2018-03-11: qty 10

## 2018-03-11 MED ORDER — VINCRISTINE SULFATE CHEMO INJECTION 1 MG/ML
2.0000 mg | Freq: Once | INTRAVENOUS | Status: AC
  Administered 2018-03-11: 2 mg via INTRAVENOUS
  Filled 2018-03-11: qty 2

## 2018-03-11 NOTE — Patient Instructions (Signed)
[facility] at [facility] Discharge Instructions     Thank you for choosing [facility] at [facility] to provide your oncology and hematology care.  To afford each patient quality time with our provider, please arrive at least 15 minutes before your scheduled appointment time.   If you have a lab appointment with the Cancer Center please come in thru the  Main Entrance and check in at the main information desk  You need to re-schedule your appointment should you arrive 10 or more minutes late.  We strive to give you quality time with our providers, and arriving late affects you and other patients whose appointments are after yours.  Also, if you no show three or more times for appointments you may be dismissed from the clinic at the providers discretion.     Again, thank you for choosing [facility].  Our hope is that these requests will decrease the amount of time that you wait before being seen by our physicians.       _____________________________________________________________  Should you have questions after your visit to [facility], please contact our office at [phone] between the hours of 8:00 a.m. and 4:30 p.m.  Voicemails left after 4:00 p.m. will not be returned until the following business day.  For prescription refill requests, have your pharmacy contact our office and allow 72 hours.    Cancer Center Support Programs:   > Cancer Support Group  2nd Tuesday of the month 1pm-2pm, Journey Room    

## 2018-03-11 NOTE — Progress Notes (Signed)
Kristin Good, [place] [address]   CLINIC:  Medical Oncology/Hematology  PCP:  Marinda Elk, [place] [address] [phone]   REASON FOR VISIT: Follow-up for Peripheral T cell Lymphoma.  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[place])    01/28/2018 -  Chemotherapy    The patient had DOXOrubicin (ADRIAMYCIN) chemo injection 50 mg, 30 mg/m2 = 50 mg (60 % of original dose 50 mg/m2), Intravenous,  Once, 3 of 8 cycles Dose modification: 30 mg/m2 (60 % of original dose 50 mg/m2, Cycle 1, Reason: Other (see comments), Comment: elevated bilirubin) Administration: 50 mg (01/28/2018), 76 mg (03/11/2018), 76 mg (02/18/2018) palonosetron (ALOXI) injection 0.25 mg, 0.25 mg, Intravenous,  Once, 3 of 8 cycles Administration: 0.25 mg (01/28/2018), 0.25 mg (02/18/2018), 0.25 mg (03/11/2018) pegfilgrastim (NEULASTA ONPRO KIT) injection 6 mg, 6 mg, Subcutaneous, Once, 3 of 8 cycles Administration: 6 mg (02/20/2018) vinCRIStine (ONCOVIN) 1 mg in sodium chloride 0.9 % 50 mL chemo infusion, 1 mg (50 % of original dose 2 mg), Intravenous,  Once, 3 of 8 cycles Dose modification: 1 mg (50 % of original dose 2 mg, Cycle 1, Reason: Other (see comments), Comment: liver dysfunctyion) Administration: 1 mg (01/28/2018), 2 mg (03/11/2018), 2 mg (02/18/2018) cyclophosphamide (CYTOXAN) 1,260 mg in sodium chloride 0.9 % 250 mL chemo infusion, 750 mg/m2 = 1,260 mg, Intravenous,  Once, 3 of 8 cycles Administration: 1,260 mg (01/28/2018), 1,140 mg (03/11/2018), 1,140 mg (02/18/2018) etoposide (VEPESID) 130 mg in sodium chloride 0.9 % 500 mL chemo infusion, 75 mg/m2 = 130 mg (75 % of original dose 100 mg/m2), Intravenous,  Once, 3 of 8 cycles Dose modification: 75 mg/m2 (75 % of original dose 100 mg/m2, Cycle 1, Reason:  Change in LFTs), 75 mg/m2 (75 % of original dose 100 mg/m2, Cycle 1, Reason: Change in LFTs) Administration: 130 mg (01/28/2018), 130 mg (01/29/2018), 130 mg (01/30/2018), 150 mg (02/19/2018), 150 mg (02/20/2018), 150 mg (03/11/2018), 150 mg (02/18/2018) fosaprepitant (EMEND) 150 mg, dexamethasone (DECADRON) 12 mg in sodium chloride 0.9 % 145 mL IVPB, , Intravenous,  Once, 3 of 6 cycles Administration:  (01/28/2018),  (02/18/2018),  (03/11/2018)  for chemotherapy treatment.       INTERVAL HISTORY:  Kristin Good 58 y.o. female returns for routine follow-up for Peripheral T cell Lymphoma. She is here today with her husband and doing well. She has been fatigued but it is improving. She has had a slight running nose and dry cough. Otherwise she is ready for her next treatment. She has been staying active and walking on her treadmill for 10 minute a day. She feels like she is getting stronger. Denies any nausea, vomiting, or diarrhea. Denies any new pains. Had not noticed any recent bleeding such as epistaxis, hematuria or hematochezia. Denies recent chest pain on exertion, shortness of breath on minimal exertion, pre-syncopal episodes, or palpitations. Denies any numbness or tingling in hands or feet. Denies any recent fevers, infections, or recent hospitalizations. Patient reports appetite at 100% and energy level at 75%.   REVIEW OF SYSTEMS:  Review of Systems  Constitutional: Positive for fatigue.  Respiratory: Positive for cough.   All other systems reviewed and are negative.    PAST MEDICAL/SURGICAL HISTORY:  Past Medical History:  Diagnosis Date  . Diabetes ([place]) 01/20/2018  .  HTN (hypertension)   . Hypothyroidism   . 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 COLONOSCOPY  07/27/2010   Procedure: COLONOSCOPY;  Surgeon: Dorothyann Peng, MD;  Location: [facility];  Service: Endoscopy;  Laterality: N/A;  . infertility surgery    . MOHS SURGERY      basal cell carcinoma  . REDUCTION MAMMAPLASTY Bilateral 2002     SOCIAL HISTORY:  Social History   Socioeconomic History  . Marital status: Married    Spouse name: Not on file  . Number of children: 0  . Years of education: Not on file  . Highest education level: Not on file  Occupational History  . Occupation: [employer]    Comment: Nurse  Social Needs  . Financial resource strain: Not on file  . Food insecurity:    Worry: Not on file    Inability: Not on file  . Transportation needs:    Medical: Not on file    Non-medical: Not on file  Tobacco Use  . Smoking status: Never Smoker  . Smokeless tobacco: Never Used  Substance and Sexual Activity  . Alcohol use: Yes    Comment: once or twice a year  . Drug use: No  . Sexual activity: Not on file  Lifestyle  . Physical activity:    Days per week: Not on file    Minutes per session: Not on file  . Stress: Not on file  Relationships  . Social connections:    Talks on phone: Not on file    Gets together: Not on file    Attends religious service: Not on file    Active member of club or organization: Not on file    Attends meetings of clubs or organizations: Not on file    Relationship status: Not on file  . Intimate partner violence:    Fear of current or ex partner: Not on file    Emotionally abused: Not on file    Physically abused: Not on file    Forced sexual activity: Not on file  Other Topics Concern  . Not on file  Social History Narrative  . Not on file    FAMILY HISTORY:  Family History  Problem Relation Age of Onset  . Heart disease Mother   . Diabetes Father   . Breast cancer Sister 49  . Breast cancer Maternal Aunt   . Colon cancer Neg Hx   . Liver disease Neg Hx     CURRENT MEDICATIONS:  Outpatient Encounter Medications as of 03/11/2018  Medication Sig Note  . allopurinol (ZYLOPRIM) 300 MG tablet Take 1 tablet (300 mg total) by mouth daily.   Marland Kitchen atorvastatin (LIPITOR) 20 MG  tablet Take 1 tablet by mouth every morning.    . calcium-vitamin D (CALCIUM 500+D HIGH POTENCY) 500-400 MG-UNIT tablet Take 1 tablet by mouth 2 (two) times daily.   . ciprofloxacin (CIPRO) 500 MG tablet Take 1 tablet (500 mg total) by mouth 2 (two) times daily. 02/02/2018: Prophylactic   . cyclophosphamide in sodium chloride 0.9 % 250 mL Inject 1,260 mg into the vein every 21 ( twenty-one) days.   Marland Kitchen DOXOrubicin HCl (ADRIAMYCIN IV) Inject 84 mg into the vein every 21 ( twenty-one) days.   . ETOPOSIDE IV Inject 170 mg into the vein every 21 ( twenty-one) days. Days 1-3   . fluconazole (DIFLUCAN) 100 MG tablet Take 1 tablet (  100 mg total) by mouth daily.   . furosemide (LASIX) 20 MG tablet Take 1 tablet (20 mg total) by mouth daily as needed. (Patient taking differently: Take 20 mg by mouth daily as needed for fluid. )   . Glucosamine-Chondroit-Vit C-Mn (GLUCOSAMINE 1500 COMPLEX PO) Take 1 capsule by mouth 2 (two) times daily.   Marland Kitchen levothyroxine (SYNTHROID, LEVOTHROID) 75 MCG tablet Take 75 mcg by mouth daily before breakfast.    . magic mouthwash SOLN Take 5 mLs by mouth 3 (three) times daily as needed for mouth pain. With lidocaine 1% 1:1 solution   . magnesium oxide (MAG-OX) 400 (241.3 Mg) MG tablet Take 1 tablet (400 mg total) by mouth 2 (two) times daily.   . metFORMIN (GLUCOPHAGE) 1000 MG tablet Take 1 tablet by mouth 2 (two) times daily.   . pegfilgrastim (NEULASTA) 6 MG/0.6ML injection Inject 6 mg into the skin See admin instructions. To be administered 3 days after chemo therapy treatment   . potassium chloride SA (K-DUR,KLOR-CON) 20 MEQ tablet Take 1 tablet (20 mEq total) by mouth daily.   . predniSONE (DELTASONE) 20 MG tablet Take 5 tablets (100 mg total) by mouth daily. Take on days 1-5 of chemotherapy.   . prochlorperazine (COMPAZINE) 10 MG tablet Take 1 tablet (10 mg total) by mouth every 6 (six) hours as needed for nausea or vomiting.   . triamterene-hydrochlorothiazide (MAXZIDE-25)  37.5-25 MG per tablet Take 1 tablet by mouth daily.     . valACYclovir (VALTREX) 500 MG tablet Take 1 tablet (500 mg total) by mouth daily.   . vinCRIStine 2 mg in sodium chloride 0.9 % 50 mL Inject 2 mg into the vein every 21 ( twenty-one) days.   . vitamin B-12 (CYANOCOBALAMIN) 1000 MCG tablet Take 1 tablet (1,000 mcg total) by mouth daily.   . vitamin C (ASCORBIC ACID) 500 MG tablet Take 500 mg by mouth 2 (two) times daily.    Facility-Administered Encounter Medications as of 03/11/2018  Medication  . sodium chloride flush (NS) 0.9 % injection 10 mL    ALLERGIES:  Allergies  Allergen Reactions  . Doxycycline Anaphylaxis  . Penicillins Anaphylaxis    Did it involve swelling of the face/tongue/throat, SOB, or low BP? Yes-SOB Did it involve sudden or severe rash/hives, skin peeling, or any reaction on the inside of your mouth or nose? Unknown Did you need to seek medical attention at a hospital or doctor's office? Yes When did it last happen?Over 10 years ago If all above answers are "NO", may proceed with cephalosporin use.   . Levofloxacin Nausea And Vomiting  . Sulfa Antibiotics Itching and Rash     PHYSICAL EXAM:  ECOG Performance status: 1  VITAL SIGNS: BP: 122/79, P:107, R: 18, T:98.4, O2:100% WEIGHT: 119  Physical Exam Constitutional:      Appearance: Normal appearance. She is normal weight.  Cardiovascular:     Rate and Rhythm: Normal rate and regular rhythm.     Heart sounds: Normal heart sounds.  Pulmonary:     Effort: Pulmonary effort is normal.     Breath sounds: Normal breath sounds.  Musculoskeletal: Normal range of motion.  Skin:    General: Skin is warm and dry.  Neurological:     Mental Status: She is alert and oriented to person, place, and time. Mental status is at baseline.  Psychiatric:        Mood and Affect: Mood normal.        Behavior: Behavior normal.  Thought Content: Thought content normal.        Judgment: Judgment normal.       LABORATORY DATA:  I have reviewed the labs as listed.  CBC    Component Value Date/Time   WBC 3.1 (L) 03/11/2018 0902   RBC 3.29 (L) 03/11/2018 0902   HGB 9.4 (L) 03/11/2018 0902   HCT 30.7 (L) 03/11/2018 0902   HCT 21.1 (L) 01/19/2018 0747   PLT 132 (L) 03/11/2018 0902   MCV 93.3 03/11/2018 0902   MCH 28.6 03/11/2018 0902   MCHC 30.6 03/11/2018 0902   RDW 19.7 (H) 03/11/2018 0902   LYMPHSABS 0.3 (L) 03/11/2018 0902   MONOABS 0.3 03/11/2018 0902   EOSABS 0.0 03/11/2018 0902   BASOSABS 0.0 03/11/2018 0902   CMP Latest Ref Rng & Units 03/11/2018 03/09/2018 03/05/2018  Glucose 70 - 99 mg/dL 195(H) 181(H) 191(H)  BUN 6 - 20 mg/dL 22(H) 19 15  Creatinine 0.44 - 1.00 mg/dL 0.76 0.69 0.65  Sodium 135 - 145 mmol/L 138 137 140  Potassium 3.5 - 5.1 mmol/L 5.0 4.3 4.0  Chloride 98 - 111 mmol/L 104 105 112(H)  CO2 22 - 32 mmol/L 24 24 20(L)  Calcium 8.9 - 10.3 mg/dL 9.3 8.7(L) 8.7(L)  Total Protein 6.5 - 8.1 g/dL 6.1(L) 5.7(L) 5.6(L)  Total Bilirubin 0.3 - 1.2 mg/dL 0.7 0.8 0.6  Alkaline Phos 38 - 126 U/L 103 110 82  AST 15 - 41 U/L 15 15 14(L)  ALT 0 - 44 U/L '15 20 17       '$ DIAGNOSTIC IMAGING:  I have independently reviewed the scans and discussed with the patient.   I have reviewed Francene Finders, NP's note and agree with the documentation.  I personally performed a face-to-face visit, made revisions and my assessment and plan is as follows.    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  PTCL, NOS: -Presentation with pancytopenia, night sweats and fevers for last 1 to 2 months. - CT CAP shows splenomegaly with no lymphadenopathy. 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  T cells do not express TDT, CD1a, CD10, CD15, CD30, CD34, CD56, CD 117 or CD99.  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 - Prognostic index  for PTCL (PIT) has 2 positive factors including serum LDH above normal and bone marrow involvement. - She is not a candidate for brentuximab-based regimen as CD30 was negative. -We talked about first-line therapy with CHOP-based regimen.  I have also talked to her about CHOEP regimen which has shown to be helpful for patients less than 60 and normal LDH. - We will obtain a baseline PET CT scan. - She has a PICC line placed for chemotherapy administration. -2D echo on 01/24/2018 shows ejection fraction of 65 to 70%. - Received first cycle of CHOEP on 01/28/2018, Neulasta on 02/02/2018. - PET/CT scan on 02/02/2018 shows splenomegaly, with no hypermetabolism.  No enlarged or hypermetabolic lymphadenopathy in the neck, chest, abdomen or pelvis. -She met with Dr. Minna Antis at [facility] for consolidation transplantation. -She was admitted to the hospital on 02/07/2018 through 02/10/2018 with small bowel obstruction which resolved with conservative measures.  She did have neutropenic fever and received IV antibiotics.  Since discharge she denies any fevers or chills.  She had one episode of night sweats last night which is mild. - Cycle 2 of chemotherapy on 02/18/2018. -  She required 2 units of PRBC last week due to anemia.  She did not have any other symptoms.  She also required magnesium infusions twice last week. -She is mildly tachycardic today.  She denied any dyspnea, ankle swellings or chest pains.  As she is receiving anthracyclines, I would repeat another echocardiogram. -We reviewed her blood counts which have improved.  She may proceed with cycle 3.  We will make a referral to Dr. Renold Genta for port placement preferably when the platelets are high. -She will come back in 3 weeks for follow-up and for her next cycle.  She will have restaging bone marrow biopsy and PET CT scan following cycle 4.  She will follow-up with Dr. Minna Antis after restaging studies.  If she is in complete response, peripheral blood stem  cells will be collected following cycle 5 of therapy.  If CR is not achieved, allogenic stem cell transplant using non-myeloablative approach will be considered.  HLA typing of the patient and her 3 siblings will be done.  2.  Hypokalemia: -Potassium is 5.0.  We will hold her potassium.  3.  Hypomagnesemia: -Her magnesium is 2.0 today.  However she required IV infusions twice last week.  She is currently taking 400 mg twice daily.  We will increase it to 3 times a day.       Orders placed this encounter:  Orders Placed This Encounter  Procedures  . ECHOCARDIOGRAM COMPLETE      Derek Jack, [place] [phone]

## 2018-03-11 NOTE — Patient Instructions (Signed)
[facility] Discharge Instructions for Patients Receiving Chemotherapy   Beginning January 23rd 2017 lab work for the [facility] will be done in the  Main lab at [facility] on 1st floor. If you have a lab appointment with the [facility] please come in thru the  Main Entrance and check in at the main information desk   Today you received the following chemotherapy agents Adraimycin,Vincristine,Cytoxan and VP-16. Follow-up as scheduled. Call clinic for any questions or concerns  To help prevent nausea and vomiting after your treatment, we encourage you to take your nausea medication   If you develop nausea and vomiting, or diarrhea that is not controlled by your medication, call the clinic.  The clinic phone number is [phone]. Office hours are Monday-Friday 8:30am-5:00pm.  BELOW ARE SYMPTOMS THAT SHOULD BE REPORTED IMMEDIATELY:  *FEVER GREATER THAN 101.0 F  *CHILLS WITH OR WITHOUT FEVER  NAUSEA AND VOMITING THAT IS NOT CONTROLLED WITH YOUR NAUSEA MEDICATION  *UNUSUAL SHORTNESS OF BREATH  *UNUSUAL BRUISING OR BLEEDING  TENDERNESS IN MOUTH AND THROAT WITH OR WITHOUT PRESENCE OF ULCERS  *URINARY PROBLEMS  *BOWEL PROBLEMS  UNUSUAL RASH Items with * indicate a potential emergency and should be followed up as soon as possible. If you have an emergency after office hours please contact your primary care physician or go to the nearest emergency department.  Please call the clinic during office hours if you have any questions or concerns.   You may also contact the Patient Navigator at [phone] should you have any questions or need assistance in obtaining follow up care.      Resources For Cancer Patients and their Caregivers ? American Cancer Society: Can assist with transportation, wigs, general needs, runs Look Good Feel Better.        (478)818-4650 ? Cancer Care: Provides financial assistance, online support groups, medication/co-pay  assistance.  1-800-813-HOPE (217)113-9706) ? [place] Assists [place] cancer patients and their families through emotional , educational and financial support.  940-260-1221 ? [place] DSS Where to apply for food stamps, Medicaid and utility assistance. 670-258-2552 ? RCATS: Transportation to medical appointments. (919)798-8836 ? Social Security Administration: May apply for disability if have a Stage IV cancer. 831-446-5532 670-387-2936 ? [employer], Disability and Transit Services: Assists with nutrition, care and transit needs. ([phone]

## 2018-03-11 NOTE — Assessment & Plan Note (Signed)
1.  PTCL, NOS: -Presentation with pancytopenia, night sweats and fevers for last 1 to 2 months. - CT CAP shows splenomegaly with no lymphadenopathy. 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  T cells do not express TDT, CD1a, CD10, CD15, CD30, CD34, CD56, CD 117 or CD99.  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 - Prognostic index for PTCL (PIT) has 2 positive factors including serum LDH above normal and bone marrow involvement. - She is not a candidate for brentuximab-based regimen as CD30 was negative. -We talked about first-line therapy with CHOP-based regimen.  I have also talked to her about CHOEP regimen which has shown to be helpful for patients less than 60 and normal LDH. - We will obtain a baseline PET CT scan. - She has a PICC line placed for chemotherapy administration. -2D echo on 01/24/2018 shows ejection fraction of 65 to 70%. - Received first cycle of CHOEP on 01/28/2018, Neulasta on 02/02/2018. - PET/CT scan on 02/02/2018 shows splenomegaly, with no hypermetabolism.  No enlarged or hypermetabolic lymphadenopathy in the neck, chest, abdomen or pelvis. -She met with Dr. Minna Antis at [facility] for consolidation transplantation. -She was admitted to the hospital on 02/07/2018 through 02/10/2018 with small bowel obstruction which resolved with conservative measures.  She did have neutropenic fever and received IV antibiotics.  Since discharge she denies any fevers or chills.  She had one episode of night sweats last night which is mild. - Cycle 2 of chemotherapy on 02/18/2018. -She required 2 units of PRBC last week due to anemia.  She did not have any other symptoms.  She also required magnesium infusions twice last week. -She is mildly tachycardic today.  She denied any dyspnea, ankle swellings or chest pains.  As she is receiving anthracyclines, I would repeat another  echocardiogram. -We reviewed her blood counts which have improved.  She may proceed with cycle 3.  We will make a referral to Dr. Renold Genta for port placement preferably when the platelets are high. -She will come back in 3 weeks for follow-up and for her next cycle.  She will have restaging bone marrow biopsy and PET CT scan following cycle 4.  She will follow-up with Dr. Minna Antis after restaging studies.  If she is in complete response, peripheral blood stem cells will be collected following cycle 5 of therapy.  If CR is not achieved, allogenic stem cell transplant using non-myeloablative approach will be considered.  HLA typing of the patient and her 3 siblings will be done.  2.  Hypokalemia: -Potassium is 5.0.  We will hold her potassium.  3.  Hypomagnesemia: -Her magnesium is 2.0 today.  However she required IV infusions twice last week.  She is currently taking 400 mg twice daily.  We will increase it to 3 times a day.

## 2018-03-11 NOTE — Progress Notes (Signed)
1005 Lab results and vital signs,including HR 107, reviewed with and pt seen by Dr. Delton Coombes and pt approved for chemo tx today per MD. Echocardiogram to be ordered for next week per MD                                  Kristin Good tolerated chemo tx well without complaints or incident. VSS upon discharge. Pt discharged self ambulatory in satisfactory condition accompanied by family member

## 2018-03-12 ENCOUNTER — Inpatient Hospital Stay (HOSPITAL_COMMUNITY): Payer: Commercial Managed Care - PPO

## 2018-03-12 ENCOUNTER — Encounter (HOSPITAL_COMMUNITY): Payer: Self-pay

## 2018-03-12 VITALS — BP 148/83 | HR 88 | Temp 97.8°F | Resp 18

## 2018-03-12 DIAGNOSIS — C8447 Peripheral T-cell lymphoma, not classified, spleen: Secondary | ICD-10-CM

## 2018-03-12 MED ORDER — SODIUM CHLORIDE 0.9 % IV SOLN
INTRAVENOUS | Status: DC
  Administered 2018-03-12: 08:00:00 via INTRAVENOUS

## 2018-03-12 MED ORDER — HEPARIN SOD (PORK) LOCK FLUSH 100 UNIT/ML IV SOLN
250.0000 [IU] | Freq: Once | INTRAVENOUS | Status: AC
  Administered 2018-03-12: 250 [IU] via INTRAVENOUS

## 2018-03-12 MED ORDER — SODIUM CHLORIDE 0.9 % IV SOLN
100.0000 mg/m2 | Freq: Once | INTRAVENOUS | Status: AC
  Administered 2018-03-12: 150 mg via INTRAVENOUS
  Filled 2018-03-12: qty 7.5

## 2018-03-12 MED ORDER — SODIUM CHLORIDE 0.9 % IV SOLN
10.0000 mg | Freq: Once | INTRAVENOUS | Status: AC
  Administered 2018-03-12: 10 mg via INTRAVENOUS
  Filled 2018-03-12: qty 1

## 2018-03-12 MED ORDER — SODIUM CHLORIDE 0.9% FLUSH
3.0000 mL | INTRAVENOUS | Status: DC | PRN
  Administered 2018-03-12: 3 mL via INTRAVENOUS
  Filled 2018-03-12: qty 10

## 2018-03-12 NOTE — Progress Notes (Signed)
Kristin Good tolerated VP-16 infusion well without complaints or incident. PICC line dressing and cap changed per protocol with site without redness or drainage. VSS upon discharge. Pt discharged self ambulatory in satisfactory condition

## 2018-03-12 NOTE — Patient Instructions (Signed)
[facility] Discharge Instructions for Patients Receiving Chemotherapy   Beginning January 23rd 2017 lab work for the Cancer Center will be done in the  Main lab at  on 1st floor. If you have a lab appointment with the Cancer Center please come in thru the  Main Entrance and check in at the main information desk   Today you received the following chemotherapy agents VP-16. Follow-up as scheduled. Call clinic for any questions or concerns  To help prevent nausea and vomiting after your treatment, we encourage you to take your nausea medication   If you develop nausea and vomiting, or diarrhea that is not controlled by your medication, call the clinic.  The clinic phone number is [phone]. Office hours are Monday-Friday 8:30am-5:00pm.  BELOW ARE SYMPTOMS THAT SHOULD BE REPORTED IMMEDIATELY:  *FEVER GREATER THAN 101.0 F  *CHILLS WITH OR WITHOUT FEVER  NAUSEA AND VOMITING THAT IS NOT CONTROLLED WITH YOUR NAUSEA MEDICATION  *UNUSUAL SHORTNESS OF BREATH  *UNUSUAL BRUISING OR BLEEDING  TENDERNESS IN MOUTH AND THROAT WITH OR WITHOUT PRESENCE OF ULCERS  *URINARY PROBLEMS  *BOWEL PROBLEMS  UNUSUAL RASH Items with * indicate a potential emergency and should be followed up as soon as possible. If you have an emergency after office hours please contact your primary care physician or go to the nearest emergency department.  Please call the clinic during office hours if you have any questions or concerns.   You may also contact the Patient Navigator at [phone] should you have any questions or need assistance in obtaining follow up care.      Resources For Cancer Patients and their Caregivers ? American Cancer Society: Can assist with transportation, wigs, general needs, runs Look Good Feel Better.        1-888-227-6333 ? Cancer Care: Provides financial assistance, online support groups, medication/co-pay assistance.  1-800-813-HOPE  (4673) ? [facility] Assists [place] cancer patients and their families through emotional , educational and financial support.  336-427-4357 ? [place] DSS Where to apply for food stamps, Medicaid and utility assistance. 336-342-1394 ? RCATS: Transportation to medical appointments. 336-347-2287 ? Social Security Administration: May apply for disability if have a Stage IV cancer. 336-342-7796 1-800-772-1213 ? [place] Aging, Disability and Transit Services: Assists with nutrition, care and transit needs. 336-349-2343         

## 2018-03-13 ENCOUNTER — Inpatient Hospital Stay (HOSPITAL_COMMUNITY): Payer: Commercial Managed Care - PPO

## 2018-03-13 VITALS — BP 131/69 | HR 89 | Temp 97.7°F | Resp 18 | Wt 117.8 lb

## 2018-03-13 DIAGNOSIS — C8447 Peripheral T-cell lymphoma, not classified, spleen: Secondary | ICD-10-CM

## 2018-03-13 LAB — CBC WITH DIFFERENTIAL/PLATELET
Abs Immature Granulocytes: 0.02 10*3/uL (ref 0.00–0.07)
Basophils Absolute: 0 10*3/uL (ref 0.0–0.1)
Basophils Relative: 0 %
EOS PCT: 0 %
Eosinophils Absolute: 0 10*3/uL (ref 0.0–0.5)
HCT: 28.1 % — ABNORMAL LOW (ref 36.0–46.0)
Hemoglobin: 9.1 g/dL — ABNORMAL LOW (ref 12.0–15.0)
Immature Granulocytes: 1 %
Lymphocytes Relative: 8 %
Lymphs Abs: 0.3 10*3/uL — ABNORMAL LOW (ref 0.7–4.0)
MCH: 29.5 pg (ref 26.0–34.0)
MCHC: 32.4 g/dL (ref 30.0–36.0)
MCV: 91.2 fL (ref 80.0–100.0)
Monocytes Absolute: 0.1 10*3/uL (ref 0.1–1.0)
Monocytes Relative: 3 %
Neutro Abs: 2.8 10*3/uL (ref 1.7–7.7)
Neutrophils Relative %: 88 %
Platelets: 148 10*3/uL — ABNORMAL LOW (ref 150–400)
RBC: 3.08 MIL/uL — ABNORMAL LOW (ref 3.87–5.11)
RDW: 19.3 % — ABNORMAL HIGH (ref 11.5–15.5)
WBC: 3.1 10*3/uL — ABNORMAL LOW (ref 4.0–10.5)
nRBC: 0 % (ref 0.0–0.2)

## 2018-03-13 LAB — MAGNESIUM: Magnesium: 2.4 mg/dL (ref 1.7–2.4)

## 2018-03-13 LAB — COMPREHENSIVE METABOLIC PANEL
ALT: 30 U/L (ref 0–44)
AST: 19 U/L (ref 15–41)
Albumin: 3.9 g/dL (ref 3.5–5.0)
Alkaline Phosphatase: 93 U/L (ref 38–126)
Anion gap: 13 (ref 5–15)
BUN: 42 mg/dL — AB (ref 6–20)
CO2: 19 mmol/L — ABNORMAL LOW (ref 22–32)
CREATININE: 0.82 mg/dL (ref 0.44–1.00)
Calcium: 9.3 mg/dL (ref 8.9–10.3)
Chloride: 104 mmol/L (ref 98–111)
GFR calc Af Amer: >60 mL/min (ref >60)
GFR calc non Af Amer: >60 mL/min (ref >60)
Glucose, Bld: 301 mg/dL — ABNORMAL HIGH (ref 70–99)
Potassium: 4.2 mmol/L (ref 3.5–5.1)
Sodium: 136 mmol/L (ref 135–145)
Total Bilirubin: 0.6 mg/dL (ref 0.3–1.2)
Total Protein: 5.9 g/dL — ABNORMAL LOW (ref 6.5–8.1)

## 2018-03-13 LAB — URIC ACID: Uric Acid, Serum: 4.8 mg/dL (ref 2.5–7.1)

## 2018-03-13 LAB — PHOSPHORUS: Phosphorus: 4.9 mg/dL — ABNORMAL HIGH (ref 2.5–4.6)

## 2018-03-13 LAB — SAMPLE TO BLOOD BANK

## 2018-03-13 MED ORDER — HEPARIN SOD (PORK) LOCK FLUSH 100 UNIT/ML IV SOLN
300.0000 [IU] | Freq: Once | INTRAVENOUS | Status: AC
  Administered 2018-03-13: 300 [IU] via INTRAVENOUS

## 2018-03-13 MED ORDER — SODIUM CHLORIDE 0.9 % IV SOLN
10.0000 mg | Freq: Once | INTRAVENOUS | Status: AC
  Administered 2018-03-13: 10 mg via INTRAVENOUS
  Filled 2018-03-13: qty 1

## 2018-03-13 MED ORDER — SODIUM CHLORIDE 0.9 % IV SOLN
100.0000 mg/m2 | Freq: Once | INTRAVENOUS | Status: AC
  Administered 2018-03-13: 150 mg via INTRAVENOUS
  Filled 2018-03-13: qty 7.5

## 2018-03-13 MED ORDER — PEGFILGRASTIM 6 MG/0.6ML ~~LOC~~ PSKT: 6.0000 mg | PREFILLED_SYRINGE | Freq: Once | SUBCUTANEOUS | Status: DC

## 2018-03-13 MED ORDER — SODIUM CHLORIDE 0.9 % IV SOLN
INTRAVENOUS | Status: DC
  Administered 2018-03-13: 09:00:00 via INTRAVENOUS

## 2018-03-13 NOTE — Progress Notes (Signed)
Labs drawn today per patient request.   Treatment given per orders. Patient tolerated it well without problems. Vitals stable and discharged home from clinic ambulatory. Follow up as scheduled.  Patient to return on Monday for labs and neulasta injeciton.

## 2018-03-13 NOTE — Patient Instructions (Signed)
[facility] Discharge Instructions for Patients Receiving Chemotherapy  Today you received the following chemotherapy agents   To help prevent nausea and vomiting after your treatment, we encourage you to take your nausea medication   If you develop nausea and vomiting that is not controlled by your nausea medication, call the clinic.   BELOW ARE SYMPTOMS THAT SHOULD BE REPORTED IMMEDIATELY:  *FEVER GREATER THAN 100.5 F  *CHILLS WITH OR WITHOUT FEVER  NAUSEA AND VOMITING THAT IS NOT CONTROLLED WITH YOUR NAUSEA MEDICATION  *UNUSUAL SHORTNESS OF BREATH  *UNUSUAL BRUISING OR BLEEDING  TENDERNESS IN MOUTH AND THROAT WITH OR WITHOUT PRESENCE OF ULCERS  *URINARY PROBLEMS  *BOWEL PROBLEMS  UNUSUAL RASH Items with * indicate a potential emergency and should be followed up as soon as possible.  Feel free to call the clinic should you have any questions or concerns. The clinic phone number is [phone].  Please show the CHEMO ALERT CARD at check-in to the Emergency Department and triage nurse.   

## 2018-03-16 ENCOUNTER — Inpatient Hospital Stay (HOSPITAL_COMMUNITY): Payer: Commercial Managed Care - PPO | Attending: Hematology

## 2018-03-16 ENCOUNTER — Other Ambulatory Visit: Payer: Self-pay

## 2018-03-16 ENCOUNTER — Ambulatory Visit (HOSPITAL_COMMUNITY)
Admission: RE | Admit: 2018-03-16 | Discharge: 2018-03-16 | Disposition: A | Discharge status: Home / Self Care | Payer: Commercial Managed Care - PPO | Source: Ambulatory Visit | Attending: Nurse Practitioner | Admitting: Nurse Practitioner

## 2018-03-16 ENCOUNTER — Encounter (HOSPITAL_COMMUNITY): Payer: Self-pay

## 2018-03-16 DIAGNOSIS — R161 Splenomegaly, not elsewhere classified: Secondary | ICD-10-CM | POA: Insufficient documentation

## 2018-03-16 DIAGNOSIS — D61818 Other pancytopenia: Secondary | ICD-10-CM | POA: Diagnosis not present

## 2018-03-16 DIAGNOSIS — I10 Essential (primary) hypertension: Secondary | ICD-10-CM | POA: Insufficient documentation

## 2018-03-16 DIAGNOSIS — R509 Fever, unspecified: Secondary | ICD-10-CM | POA: Insufficient documentation

## 2018-03-16 DIAGNOSIS — Z7689 Persons encountering health services in other specified circumstances: Secondary | ICD-10-CM | POA: Diagnosis not present

## 2018-03-16 DIAGNOSIS — E876 Hypokalemia: Secondary | ICD-10-CM | POA: Insufficient documentation

## 2018-03-16 DIAGNOSIS — Z5111 Encounter for antineoplastic chemotherapy: Secondary | ICD-10-CM | POA: Diagnosis not present

## 2018-03-16 DIAGNOSIS — Z85828 Personal history of other malignant neoplasm of skin: Secondary | ICD-10-CM | POA: Diagnosis not present

## 2018-03-16 DIAGNOSIS — E039 Hypothyroidism, unspecified: Secondary | ICD-10-CM | POA: Insufficient documentation

## 2018-03-16 DIAGNOSIS — R000 Tachycardia, unspecified: Secondary | ICD-10-CM | POA: Insufficient documentation

## 2018-03-16 DIAGNOSIS — C8447 Peripheral T-cell lymphoma, not classified, spleen: Secondary | ICD-10-CM | POA: Diagnosis present

## 2018-03-16 DIAGNOSIS — E119 Type 2 diabetes mellitus without complications: Secondary | ICD-10-CM | POA: Diagnosis not present

## 2018-03-16 DIAGNOSIS — R61 Generalized hyperhidrosis: Secondary | ICD-10-CM | POA: Insufficient documentation

## 2018-03-16 DIAGNOSIS — Z79899 Other long term (current) drug therapy: Secondary | ICD-10-CM | POA: Insufficient documentation

## 2018-03-16 LAB — COMPREHENSIVE METABOLIC PANEL
ALBUMIN: 4 g/dL (ref 3.5–5.0)
ALT: 23 U/L (ref 0–44)
ANION GAP: 12 (ref 5–15)
AST: 17 U/L (ref 15–41)
Alkaline Phosphatase: 73 U/L (ref 38–126)
BUN: 52 mg/dL — ABNORMAL HIGH (ref 6–20)
CO2: 22 mmol/L (ref 22–32)
Calcium: 9.2 mg/dL (ref 8.9–10.3)
Chloride: 101 mmol/L (ref 98–111)
Creatinine, Ser: 0.9 mg/dL (ref 0.44–1.00)
GFR calc Af Amer: >60 mL/min (ref >60)
GFR calc non Af Amer: >60 mL/min (ref >60)
Glucose, Bld: 245 mg/dL — ABNORMAL HIGH (ref 70–99)
Potassium: 3.6 mmol/L (ref 3.5–5.1)
Sodium: 135 mmol/L (ref 135–145)
Total Bilirubin: 0.7 mg/dL (ref 0.3–1.2)
Total Protein: 6 g/dL — ABNORMAL LOW (ref 6.5–8.1)

## 2018-03-16 LAB — CBC WITH DIFFERENTIAL/PLATELET
Abs Immature Granulocytes: 0.05 10*3/uL (ref 0.00–0.07)
BASOS PCT: 0 %
Basophils Absolute: 0 10*3/uL (ref 0.0–0.1)
EOS ABS: 0 10*3/uL (ref 0.0–0.5)
Eosinophils Relative: 0 %
HCT: 29.3 % — ABNORMAL LOW (ref 36.0–46.0)
Hemoglobin: 9.5 g/dL — ABNORMAL LOW (ref 12.0–15.0)
Immature Granulocytes: 1 %
Lymphocytes Relative: 25 %
Lymphs Abs: 0.9 10*3/uL (ref 0.7–4.0)
MCH: 28.7 pg (ref 26.0–34.0)
MCHC: 32.4 g/dL (ref 30.0–36.0)
MCV: 88.5 fL (ref 80.0–100.0)
Monocytes Absolute: 0 10*3/uL — ABNORMAL LOW (ref 0.1–1.0)
Monocytes Relative: 0 %
NEUTROS ABS: 2.7 10*3/uL (ref 1.7–7.7)
NEUTROS PCT: 74 %
PLATELETS: 171 10*3/uL (ref 150–400)
RBC: 3.31 MIL/uL — AB (ref 3.87–5.11)
RDW: 18.3 % — ABNORMAL HIGH (ref 11.5–15.5)
WBC: 3.7 10*3/uL — ABNORMAL LOW (ref 4.0–10.5)
nRBC: 0 % (ref 0.0–0.2)

## 2018-03-16 LAB — URIC ACID: Uric Acid, Serum: 4.6 mg/dL (ref 2.5–7.1)

## 2018-03-16 LAB — MAGNESIUM: MAGNESIUM: 2.4 mg/dL (ref 1.7–2.4)

## 2018-03-16 LAB — ECHOCARDIOGRAM COMPLETE

## 2018-03-16 LAB — SAMPLE TO BLOOD BANK

## 2018-03-16 LAB — PHOSPHORUS: Phosphorus: 4.4 mg/dL (ref 2.5–4.6)

## 2018-03-16 MED ORDER — HEPARIN SOD (PORK) LOCK FLUSH 100 UNIT/ML IV SOLN
500.0000 [IU] | Freq: Once | INTRAVENOUS | Status: AC
  Administered 2018-03-16: 500 [IU] via INTRAVENOUS

## 2018-03-16 MED ORDER — PROCHLORPERAZINE MALEATE 10 MG PO TABS
10.0000 mg | ORAL_TABLET | Freq: Once | ORAL | Status: AC
  Administered 2018-03-16: 10 mg via ORAL

## 2018-03-16 MED ORDER — PEGFILGRASTIM-CBQV 6 MG/0.6ML ~~LOC~~ SOSY
6.0000 mg | PREFILLED_SYRINGE | Freq: Once | SUBCUTANEOUS | Status: AC
  Administered 2018-03-16: 6 mg via SUBCUTANEOUS
  Filled 2018-03-16: qty 0.6

## 2018-03-16 MED ORDER — PROCHLORPERAZINE MALEATE 10 MG PO TABS
ORAL_TABLET | ORAL | Status: AC
  Filled 2018-03-16: qty 1

## 2018-03-16 MED ORDER — SODIUM CHLORIDE 0.9% FLUSH
10.0000 mL | Freq: Once | INTRAVENOUS | Status: AC
  Administered 2018-03-16: 10 mL via INTRAVENOUS

## 2018-03-16 NOTE — Patient Instructions (Signed)
[facility]  Discharge Instructions:   _______________________________________________________________  Thank you for choosing [facility] at [facility] to provide your oncology and hematology care.  To afford each patient quality time with our providers, please arrive at least 15 minutes before your scheduled appointment.  You need to re-schedule your appointment if you arrive 10 or more minutes late.  We strive to give you quality time with our providers, and arriving late affects you and other patients whose appointments are after yours.  Also, if you no show three or more times for appointments you may be dismissed from the clinic.  Again, thank you for choosing [facility]. Our hope is that these requests will allow you access to exceptional care and in a timely manner. _______________________________________________________________  If you have questions after your visit, please contact our office at [phone] between the hours of 8:30 a.m. and 5:00 p.m. Voicemails left after 4:30 p.m. will not be returned until the following business day. _______________________________________________________________  For prescription refill requests, have your pharmacy contact our office. _______________________________________________________________  Recommendations made by the consultant and any test results will be sent to your referring physician. _______________________________________________________________ 

## 2018-03-16 NOTE — Progress Notes (Signed)
*  PRELIMINARY RESULTS* Echocardiogram 2D Echocardiogram has been performed.  Leavy Cella 03/16/2018, 2:21 PM

## 2018-03-16 NOTE — Progress Notes (Signed)
Patients PICC line flushed with lab draw.  No complaints of pain with flush.  Insertion site clean and dry with no complaints of pain.  Flushed per policy.  Labs reviewed with copy given.  Patient left ambulatory with family with no s/s of distress noted.

## 2018-03-16 NOTE — Progress Notes (Signed)
0925- patient requesting compazine, verbal order given to administer by Francene Finders NP.

## 2018-03-17 ENCOUNTER — Other Ambulatory Visit (HOSPITAL_COMMUNITY): Payer: Self-pay | Admitting: Nurse Practitioner

## 2018-03-17 ENCOUNTER — Telehealth (HOSPITAL_COMMUNITY): Payer: Self-pay | Admitting: *Deleted

## 2018-03-17 DIAGNOSIS — R059 Cough, unspecified: Secondary | ICD-10-CM

## 2018-03-17 DIAGNOSIS — R05 Cough: Secondary | ICD-10-CM

## 2018-03-17 MED ORDER — LEVOFLOXACIN 500 MG PO TABS: 500.0000 mg | ORAL_TABLET | Freq: Every day | ORAL | 0 refills | Status: DC

## 2018-03-17 NOTE — Telephone Encounter (Signed)
Pt phoned the clinic c/o clear nasal drainage and cough productive of small to moderate amount of yellow-green sputum.  Denies fever/chills.  Dr. Delton Coombes notified - order placed for CXR and Rx Levaquin 500 mg daily x 5 days sent to pt's pharmacy.  Pt notified of the above and instructed to hold her Cipro until she completes the course of Levaquin. She verbalizes understanding.

## 2018-03-18 ENCOUNTER — Ambulatory Visit (HOSPITAL_COMMUNITY)
Admission: RE | Admit: 2018-03-18 | Discharge: 2018-03-18 | Disposition: A | Discharge status: Home / Self Care | Payer: Commercial Managed Care - PPO | Source: Ambulatory Visit | Attending: Nurse Practitioner | Admitting: Nurse Practitioner

## 2018-03-18 DIAGNOSIS — R05 Cough: Secondary | ICD-10-CM | POA: Diagnosis not present

## 2018-03-18 DIAGNOSIS — R059 Cough, unspecified: Secondary | ICD-10-CM

## 2018-03-19 ENCOUNTER — Encounter: Payer: Self-pay | Admitting: General Surgery

## 2018-03-19 ENCOUNTER — Ambulatory Visit (INDEPENDENT_AMBULATORY_CARE_PROVIDER_SITE_OTHER): Payer: Commercial Managed Care - PPO | Admitting: General Surgery

## 2018-03-19 ENCOUNTER — Inpatient Hospital Stay (HOSPITAL_COMMUNITY): Payer: Commercial Managed Care - PPO

## 2018-03-19 VITALS — BP 98/70 | HR 104 | Temp 97.3°F | Resp 22 | Wt 113.6 lb

## 2018-03-19 DIAGNOSIS — C8447 Peripheral T-cell lymphoma, not classified, spleen: Secondary | ICD-10-CM

## 2018-03-19 LAB — CBC WITH DIFFERENTIAL/PLATELET
Abs Immature Granulocytes: 0.02 10*3/uL (ref 0.00–0.07)
Basophils Absolute: 0 10*3/uL (ref 0.0–0.1)
Basophils Relative: 0 %
Eosinophils Absolute: 0 10*3/uL (ref 0.0–0.5)
Eosinophils Relative: 0 %
HCT: 25.1 % — ABNORMAL LOW (ref 36.0–46.0)
Hemoglobin: 8.3 g/dL — ABNORMAL LOW (ref 12.0–15.0)
Immature Granulocytes: 8 %
Lymphocytes Relative: 76 %
Lymphs Abs: 0.2 10*3/uL — ABNORMAL LOW (ref 0.7–4.0)
MCH: 29.9 pg (ref 26.0–34.0)
MCHC: 33.1 g/dL (ref 30.0–36.0)
MCV: 90.3 fL (ref 80.0–100.0)
Monocytes Absolute: 0 10*3/uL — ABNORMAL LOW (ref 0.1–1.0)
Monocytes Relative: 4 %
NRBC: 0 % (ref 0.0–0.2)
Neutro Abs: 0 10*3/uL — ABNORMAL LOW (ref 1.7–7.7)
Neutrophils Relative %: 12 %
Platelets: 38 10*3/uL — ABNORMAL LOW (ref 150–400)
RBC: 2.78 MIL/uL — AB (ref 3.87–5.11)
RDW: 17.9 % — ABNORMAL HIGH (ref 11.5–15.5)
WBC: 0.3 10*3/uL — CL (ref 4.0–10.5)

## 2018-03-19 LAB — COMPREHENSIVE METABOLIC PANEL
ALBUMIN: 3.5 g/dL (ref 3.5–5.0)
ALT: 12 U/L (ref 0–44)
AST: 14 U/L — ABNORMAL LOW (ref 15–41)
Alkaline Phosphatase: 72 U/L (ref 38–126)
Anion gap: 10 (ref 5–15)
BILIRUBIN TOTAL: 0.8 mg/dL (ref 0.3–1.2)
BUN: 36 mg/dL — ABNORMAL HIGH (ref 6–20)
CO2: 22 mmol/L (ref 22–32)
Calcium: 9.2 mg/dL (ref 8.9–10.3)
Chloride: 105 mmol/L (ref 98–111)
Creatinine, Ser: 0.84 mg/dL (ref 0.44–1.00)
GFR calc Af Amer: >60 mL/min (ref >60)
GFR calc non Af Amer: >60 mL/min (ref >60)
Glucose, Bld: 291 mg/dL — ABNORMAL HIGH (ref 70–99)
Potassium: 3.9 mmol/L (ref 3.5–5.1)
SODIUM: 137 mmol/L (ref 135–145)
Total Protein: 5.5 g/dL — ABNORMAL LOW (ref 6.5–8.1)

## 2018-03-19 LAB — MAGNESIUM: Magnesium: 1.8 mg/dL (ref 1.7–2.4)

## 2018-03-19 MED ORDER — SODIUM CHLORIDE 0.9% FLUSH
20.0000 mL | Freq: Once | INTRAVENOUS | Status: AC
  Administered 2018-03-19: 20 mL via INTRAVENOUS

## 2018-03-19 MED ORDER — HEPARIN SOD (PORK) LOCK FLUSH 100 UNIT/ML IV SOLN
250.0000 [IU] | Freq: Once | INTRAVENOUS | Status: AC
  Administered 2018-03-19: 250 [IU] via INTRAVENOUS

## 2018-03-19 NOTE — H&P (View-Only) (Signed)
Kristin Good [place]; [id number]; 1960/06/27   HPI Patient is a 58 year old white female who was referred to my care by Dr. Delton Coombes for Port-A-Cath placement.  She is currently undergoing treatment for lymphoma and needs central venous access.  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 Past Medical History:  Diagnosis Date  . Diabetes ([place]) 01/20/2018  . HTN (hypertension)   . Hypothyroidism   . 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 COLONOSCOPY  07/27/2010   Procedure: COLONOSCOPY;  Surgeon: Dorothyann Peng, MD;  Location: [facility];  Service: Endoscopy;  Laterality: N/A;  . infertility surgery    . MOHS SURGERY     basal cell carcinoma  . REDUCTION MAMMAPLASTY Bilateral 2002    Family History  Problem Relation Age of Onset  . Heart disease Mother   . Diabetes Father   . Breast cancer Sister 68  . Breast cancer Maternal Aunt   . Colon cancer Neg Hx   . Liver disease Neg Hx     Current Outpatient Medications on File Prior to Visit  Medication Sig Dispense Refill  . allopurinol (ZYLOPRIM) 300 MG tablet Take 1 tablet (300 mg total) by mouth daily. 30 tablet 3  . atorvastatin (LIPITOR) 20 MG tablet Take 1 tablet by mouth every morning.     . calcium-vitamin D (CALCIUM 500+D HIGH POTENCY) 500-400 MG-UNIT tablet Take 1 tablet by mouth 2 (two) times daily.    . ciprofloxacin (CIPRO) 500 MG tablet Take 1 tablet (500 mg total) by mouth 2 (two) times daily. 60 tablet 2  . cyclophosphamide in sodium chloride 0.9 % 250 mL Inject 1,260 mg into the vein every 21 ( twenty-one) days.    Marland Kitchen DOXOrubicin HCl (ADRIAMYCIN IV) Inject 84 mg into the vein every 21 ( twenty-one) days.    . ETOPOSIDE IV Inject 170 mg into the vein every 21 ( twenty-one) days. Days 1-3    . fluconazole (DIFLUCAN) 100 MG tablet Take 1 tablet (100 mg total) by mouth daily. 30 tablet 2  . furosemide (LASIX) 20 MG tablet Take 1 tablet  (20 mg total) by mouth daily as needed. (Patient taking differently: Take 20 mg by mouth daily as needed for fluid. ) 30 tablet 2  . Glucosamine-Chondroit-Vit C-Mn (GLUCOSAMINE 1500 COMPLEX PO) Take 1 capsule by mouth 2 (two) times daily.    Marland Kitchen levofloxacin (LEVAQUIN) 500 MG tablet Take 1 tablet (500 mg total) by mouth daily. 5 tablet 0  . levothyroxine (SYNTHROID, LEVOTHROID) 75 MCG tablet Take 75 mcg by mouth daily before breakfast.     . magic mouthwash SOLN Take 5 mLs by mouth 3 (three) times daily as needed for mouth pain. With lidocaine 1% 1:1 solution 120 mL 0  . magnesium oxide (MAG-OX) 400 (241.3 Mg) MG tablet Take 1 tablet (400 mg total) by mouth 2 (two) times daily. 60 tablet 2  . metFORMIN (GLUCOPHAGE) 1000 MG tablet Take 1 tablet by mouth 2 (two) times daily.    . pegfilgrastim (NEULASTA) 6 MG/0.6ML injection Inject 6 mg into the skin See admin instructions. To be administered 3 days after chemo therapy treatment    . potassium chloride SA (K-DUR,KLOR-CON) 20 MEQ tablet Take 1 tablet (20 mEq total) by mouth daily. 30 tablet 2  . predniSONE (DELTASONE) 20 MG tablet Take 5 tablets (100 mg total) by mouth daily. Take on days  1-5 of chemotherapy. 30 tablet 2  . prochlorperazine (COMPAZINE) 10 MG tablet Take 1 tablet (10 mg total) by mouth every 6 (six) hours as needed for nausea or vomiting. 30 tablet 2  . triamterene-hydrochlorothiazide (MAXZIDE-25) 37.5-25 MG per tablet Take 1 tablet by mouth daily.      . valACYclovir (VALTREX) 500 MG tablet Take 1 tablet (500 mg total) by mouth daily. 60 tablet 2  . vinCRIStine 2 mg in sodium chloride 0.9 % 50 mL Inject 2 mg into the vein every 21 ( twenty-one) days.    . vitamin B-12 (CYANOCOBALAMIN) 1000 MCG tablet Take 1 tablet (1,000 mcg total) by mouth daily. 30 tablet 0  . vitamin C (ASCORBIC ACID) 500 MG tablet Take 500 mg by mouth 2 (two) times daily.     Current Facility-Administered Medications on File Prior to Visit  Medication Dose Route  Frequency Provider Last Rate Last Dose  . sodium chloride flush (NS) 0.9 % injection 10 mL  10 mL Intracatheter PRN Derek Jack, MD   10 mL at 02/18/18 0933    Allergies  Allergen Reactions  . Doxycycline Anaphylaxis  . Penicillins Anaphylaxis    Did it involve swelling of the face/tongue/throat, SOB, or low BP? Yes-SOB Did it involve sudden or severe rash/hives, skin peeling, or any reaction on the inside of your mouth or nose? Unknown Did you need to seek medical attention at a hospital or doctor's office? Yes When did it last happen?Over 10 years ago If all above answers are "NO", may proceed with cephalosporin use.   . Levofloxacin Nausea And Vomiting  . Sulfa Antibiotics Itching and Rash    Social History   Substance and Sexual Activity  Alcohol Use Yes   Comment: once or twice a year    Social History   Tobacco Use  Smoking Status Never Smoker  Smokeless Tobacco Never Used    Review of Systems  Constitutional: Negative.   HENT: Negative.   Eyes: Negative.   Respiratory: Negative.   Cardiovascular: Negative.   Gastrointestinal: Negative.   Genitourinary: Negative.   Musculoskeletal: Negative.   Skin: Negative.   Neurological: Negative.   Endo/Heme/Allergies: Negative.   Psychiatric/Behavioral: Negative.     Objective   Vitals:   03/19/18 0956  BP: 98/70  Pulse: (!) 104  Resp: (!) 22  Temp: (!) 97.3 F (36.3 C)    Physical Exam Vitals signs reviewed.  Constitutional:      Appearance: Normal appearance. She is not ill-appearing.  HENT:     Head: Normocephalic and atraumatic.  Cardiovascular:     Rate and Rhythm: Normal rate and regular rhythm.     Heart sounds: Normal heart sounds. No murmur. No friction rub. No gallop.   Pulmonary:     Effort: Pulmonary effort is normal. No respiratory distress.     Breath sounds: Normal breath sounds. No stridor. No wheezing, rhonchi or rales.  Skin:    General: Skin is warm and dry.   Neurological:     Mental Status: She is alert and oriented to person, place, and time.    Dr. Tomie China notes reviewed Assessment   Lymphoma, need for central venous access  Plan   Patient is scheduled for Port-A-Cath insertion on 03/27/2018.  The risks and benefits of the procedure including bleeding, infection, and pneumothorax were fully explained to the patient, who gave informed consent.

## 2018-03-19 NOTE — H&P (Signed)
Kristin Good; [id number]; 1960-08-31   HPI Patient is a 58 year old white female who was referred to my care by Dr. Delton Coombes for Port-A-Cath placement.  She is currently undergoing treatment for lymphoma and needs central venous access.  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 Past Medical History:  Diagnosis Date  . Diabetes ([place]) 01/20/2018  . HTN (hypertension)   . Hypothyroidism   . 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 COLONOSCOPY  07/27/2010   Procedure: COLONOSCOPY;  Surgeon: Dorothyann Peng, MD;  [facility];  Service: Endoscopy;  Laterality: N/A;  . infertility surgery    . MOHS SURGERY     basal cell carcinoma  . REDUCTION MAMMAPLASTY Bilateral 2002    Family History  Problem Relation Age of Onset  . Heart disease Mother   . Diabetes Father   . Breast cancer Sister 49  . Breast cancer Maternal Aunt   . Colon cancer Neg Hx   . Liver disease Neg Hx     Current Outpatient Medications on File Prior to Visit  Medication Sig Dispense Refill  . allopurinol (ZYLOPRIM) 300 MG tablet Take 1 tablet (300 mg total) by mouth daily. 30 tablet 3  . atorvastatin (LIPITOR) 20 MG tablet Take 1 tablet by mouth every morning.     . calcium-vitamin D (CALCIUM 500+D HIGH POTENCY) 500-400 MG-UNIT tablet Take 1 tablet by mouth 2 (two) times daily.    . ciprofloxacin (CIPRO) 500 MG tablet Take 1 tablet (500 mg total) by mouth 2 (two) times daily. 60 tablet 2  . cyclophosphamide in sodium chloride 0.9 % 250 mL Inject 1,260 mg into the vein every 21 ( twenty-one) days.    Marland Kitchen DOXOrubicin HCl (ADRIAMYCIN IV) Inject 84 mg into the vein every 21 ( twenty-one) days.    . ETOPOSIDE IV Inject 170 mg into the vein every 21 ( twenty-one) days. Days 1-3    . fluconazole (DIFLUCAN) 100 MG tablet Take 1 tablet (100 mg total) by mouth daily. 30 tablet 2  . furosemide (LASIX) 20 MG tablet Take 1 tablet  (20 mg total) by mouth daily as needed. (Patient taking differently: Take 20 mg by mouth daily as needed for fluid. ) 30 tablet 2  . Glucosamine-Chondroit-Vit C-Mn (GLUCOSAMINE 1500 COMPLEX PO) Take 1 capsule by mouth 2 (two) times daily.    Marland Kitchen levofloxacin (LEVAQUIN) 500 MG tablet Take 1 tablet (500 mg total) by mouth daily. 5 tablet 0  . levothyroxine (SYNTHROID, LEVOTHROID) 75 MCG tablet Take 75 mcg by mouth daily before breakfast.     . magic mouthwash SOLN Take 5 mLs by mouth 3 (three) times daily as needed for mouth pain. With lidocaine 1% 1:1 solution 120 mL 0  . magnesium oxide (MAG-OX) 400 (241.3 Mg) MG tablet Take 1 tablet (400 mg total) by mouth 2 (two) times daily. 60 tablet 2  . metFORMIN (GLUCOPHAGE) 1000 MG tablet Take 1 tablet by mouth 2 (two) times daily.    . pegfilgrastim (NEULASTA) 6 MG/0.6ML injection Inject 6 mg into the skin See admin instructions. To be administered 3 days after chemo therapy treatment    . potassium chloride SA (K-DUR,KLOR-CON) 20 MEQ tablet Take 1 tablet (20 mEq total) by mouth daily. 30 tablet 2  . predniSONE (DELTASONE) 20 MG tablet Take 5 tablets (100 mg total) by mouth daily. Take on days  1-5 of chemotherapy. 30 tablet 2  . prochlorperazine (COMPAZINE) 10 MG tablet Take 1 tablet (10 mg total) by mouth every 6 (six) hours as needed for nausea or vomiting. 30 tablet 2  . triamterene-hydrochlorothiazide (MAXZIDE-25) 37.5-25 MG per tablet Take 1 tablet by mouth daily.      . valACYclovir (VALTREX) 500 MG tablet Take 1 tablet (500 mg total) by mouth daily. 60 tablet 2  . vinCRIStine 2 mg in sodium chloride 0.9 % 50 mL Inject 2 mg into the vein every 21 ( twenty-one) days.    . vitamin B-12 (CYANOCOBALAMIN) 1000 MCG tablet Take 1 tablet (1,000 mcg total) by mouth daily. 30 tablet 0  . vitamin C (ASCORBIC ACID) 500 MG tablet Take 500 mg by mouth 2 (two) times daily.     Current Facility-Administered Medications on File Prior to Visit  Medication Dose Route  Frequency Provider Last Rate Last Dose  . sodium chloride flush (NS) 0.9 % injection 10 mL  10 mL Intracatheter PRN Derek Jack, MD   10 mL at 02/18/18 0933    Allergies  Allergen Reactions  . Doxycycline Anaphylaxis  . Penicillins Anaphylaxis    Did it involve swelling of the face/tongue/throat, SOB, or low BP? Yes-SOB Did it involve sudden or severe rash/hives, skin peeling, or any reaction on the inside of your mouth or nose? Unknown Did you need to seek medical attention at a hospital or doctor's office? Yes When did it last happen?Over 10 years ago If all above answers are "NO", may proceed with cephalosporin use.   . Levofloxacin Nausea And Vomiting  . Sulfa Antibiotics Itching and Rash    Social History   Substance and Sexual Activity  Alcohol Use Yes   Comment: once or twice a year    Social History   Tobacco Use  Smoking Status Never Smoker  Smokeless Tobacco Never Used    Review of Systems  Constitutional: Negative.   HENT: Negative.   Eyes: Negative.   Respiratory: Negative.   Cardiovascular: Negative.   Gastrointestinal: Negative.   Genitourinary: Negative.   Musculoskeletal: Negative.   Skin: Negative.   Neurological: Negative.   Endo/Heme/Allergies: Negative.   Psychiatric/Behavioral: Negative.     Objective   Vitals:   03/19/18 0956  BP: 98/70  Pulse: (!) 104  Resp: (!) 22  Temp: (!) 97.3 F (36.3 C)    Physical Exam Vitals signs reviewed.  Constitutional:      Appearance: Normal appearance. She is not ill-appearing.  HENT:     Head: Normocephalic and atraumatic.  Cardiovascular:     Rate and Rhythm: Normal rate and regular rhythm.     Heart sounds: Normal heart sounds. No murmur. No friction rub. No gallop.   Pulmonary:     Effort: Pulmonary effort is normal. No respiratory distress.     Breath sounds: Normal breath sounds. No stridor. No wheezing, rhonchi or rales.  Skin:    General: Skin is warm and dry.   Neurological:     Mental Status: She is alert and oriented to person, place, and time.    Dr. Tomie China notes reviewed Assessment   Lymphoma, need for central venous access  Plan   Patient is scheduled for Port-A-Cath insertion on 03/27/2018.  The risks and benefits of the procedure including bleeding, infection, and pneumothorax were fully explained to the patient, who gave informed consent.

## 2018-03-19 NOTE — Progress Notes (Signed)
Kristin Good [place]; [id number]; [birth year]   HPI Patient is a 58 year old white female who was referred to my care by Dr. Delton Coombes for Port-A-Cath placement.  She is currently undergoing treatment for lymphoma and needs central venous access.  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 Past Medical History:  Diagnosis Date  . Diabetes ([place]) 01/20/2018  . HTN (hypertension)   . Hypothyroidism   . 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 COLONOSCOPY  07/27/2010   Procedure: COLONOSCOPY;  Surgeon: Dorothyann Peng, MD;  [facility];  Service: Endoscopy;  Laterality: N/A;  . infertility surgery    . MOHS SURGERY     basal cell carcinoma  . REDUCTION MAMMAPLASTY Bilateral 2002    Family History  Problem Relation Age of Onset  . Heart disease Mother   . Diabetes Father   . Breast cancer Sister 12  . Breast cancer Maternal Aunt   . Colon cancer Neg Hx   . Liver disease Neg Hx     Current Outpatient Medications on File Prior to Visit  Medication Sig Dispense Refill  . allopurinol (ZYLOPRIM) 300 MG tablet Take 1 tablet (300 mg total) by mouth daily. 30 tablet 3  . atorvastatin (LIPITOR) 20 MG tablet Take 1 tablet by mouth every morning.     . calcium-vitamin D (CALCIUM 500+D HIGH POTENCY) 500-400 MG-UNIT tablet Take 1 tablet by mouth 2 (two) times daily.    . ciprofloxacin (CIPRO) 500 MG tablet Take 1 tablet (500 mg total) by mouth 2 (two) times daily. 60 tablet 2  . cyclophosphamide in sodium chloride 0.9 % 250 mL Inject 1,260 mg into the vein every 21 ( twenty-one) days.    Marland Kitchen DOXOrubicin HCl (ADRIAMYCIN IV) Inject 84 mg into the vein every 21 ( twenty-one) days.    . ETOPOSIDE IV Inject 170 mg into the vein every 21 ( twenty-one) days. Days 1-3    . fluconazole (DIFLUCAN) 100 MG tablet Take 1 tablet (100 mg total) by mouth daily. 30 tablet 2  . furosemide (LASIX) 20 MG tablet Take 1 tablet  (20 mg total) by mouth daily as needed. (Patient taking differently: Take 20 mg by mouth daily as needed for fluid. ) 30 tablet 2  . Glucosamine-Chondroit-Vit C-Mn (GLUCOSAMINE 1500 COMPLEX PO) Take 1 capsule by mouth 2 (two) times daily.    Marland Kitchen levofloxacin (LEVAQUIN) 500 MG tablet Take 1 tablet (500 mg total) by mouth daily. 5 tablet 0  . levothyroxine (SYNTHROID, LEVOTHROID) 75 MCG tablet Take 75 mcg by mouth daily before breakfast.     . magic mouthwash SOLN Take 5 mLs by mouth 3 (three) times daily as needed for mouth pain. With lidocaine 1% 1:1 solution 120 mL 0  . magnesium oxide (MAG-OX) 400 (241.3 Mg) MG tablet Take 1 tablet (400 mg total) by mouth 2 (two) times daily. 60 tablet 2  . metFORMIN (GLUCOPHAGE) 1000 MG tablet Take 1 tablet by mouth 2 (two) times daily.    . pegfilgrastim (NEULASTA) 6 MG/0.6ML injection Inject 6 mg into the skin See admin instructions. To be administered 3 days after chemo therapy treatment    . potassium chloride SA (K-DUR,KLOR-CON) 20 MEQ tablet Take 1 tablet (20 mEq total) by mouth daily. 30 tablet 2  . predniSONE (DELTASONE) 20 MG tablet Take 5 tablets (100 mg total) by mouth daily. Take on days  1-5 of chemotherapy. 30 tablet 2  . prochlorperazine (COMPAZINE) 10 MG tablet Take 1 tablet (10 mg total) by mouth every 6 (six) hours as needed for nausea or vomiting. 30 tablet 2  . triamterene-hydrochlorothiazide (MAXZIDE-25) 37.5-25 MG per tablet Take 1 tablet by mouth daily.      . valACYclovir (VALTREX) 500 MG tablet Take 1 tablet (500 mg total) by mouth daily. 60 tablet 2  . vinCRIStine 2 mg in sodium chloride 0.9 % 50 mL Inject 2 mg into the vein every 21 ( twenty-one) days.    . vitamin B-12 (CYANOCOBALAMIN) 1000 MCG tablet Take 1 tablet (1,000 mcg total) by mouth daily. 30 tablet 0  . vitamin C (ASCORBIC ACID) 500 MG tablet Take 500 mg by mouth 2 (two) times daily.     Current Facility-Administered Medications on File Prior to Visit  Medication Dose Route  Frequency Provider Last Rate Last Dose  . sodium chloride flush (NS) 0.9 % injection 10 mL  10 mL Intracatheter PRN Derek Jack, MD   10 mL at 02/18/18 0933    Allergies  Allergen Reactions  . Doxycycline Anaphylaxis  . Penicillins Anaphylaxis    Did it involve swelling of the face/tongue/throat, SOB, or low BP? Yes-SOB Did it involve sudden or severe rash/hives, skin peeling, or any reaction on the inside of your mouth or nose? Unknown Did you need to seek medical attention at a hospital or doctor's office? Yes When did it last happen?Over 10 years ago If all above answers are "NO", may proceed with cephalosporin use.   . Levofloxacin Nausea And Vomiting  . Sulfa Antibiotics Itching and Rash    Social History   Substance and Sexual Activity  Alcohol Use Yes   Comment: once or twice a year    Social History   Tobacco Use  Smoking Status Never Smoker  Smokeless Tobacco Never Used    Review of Systems  Constitutional: Negative.   HENT: Negative.   Eyes: Negative.   Respiratory: Negative.   Cardiovascular: Negative.   Gastrointestinal: Negative.   Genitourinary: Negative.   Musculoskeletal: Negative.   Skin: Negative.   Neurological: Negative.   Endo/Heme/Allergies: Negative.   Psychiatric/Behavioral: Negative.     Objective   Vitals:   03/19/18 0956  BP: 98/70  Pulse: (!) 104  Resp: (!) 22  Temp: (!) 97.3 F (36.3 C)    Physical Exam Vitals signs reviewed.  Constitutional:      Appearance: Normal appearance. She is not ill-appearing.  HENT:     Head: Normocephalic and atraumatic.  Cardiovascular:     Rate and Rhythm: Normal rate and regular rhythm.     Heart sounds: Normal heart sounds. No murmur. No friction rub. No gallop.   Pulmonary:     Effort: Pulmonary effort is normal. No respiratory distress.     Breath sounds: Normal breath sounds. No stridor. No wheezing, rhonchi or rales.  Skin:    General: Skin is warm and dry.   Neurological:     Mental Status: She is alert and oriented to person, place, and time.    Dr. Tomie China notes reviewed Assessment   Lymphoma, need for central venous access  Plan   Patient is scheduled for Port-A-Cath insertion on 03/27/2018.  The risks and benefits of the procedure including bleeding, infection, and pneumothorax were fully explained to the patient, who gave informed consent.

## 2018-03-19 NOTE — Progress Notes (Signed)
CRITICAL VALUE ALERT  Critical Value:  WBC 0.3  Date & Time Notied:  03/19/2018 at [place]  Provider Notified: Dr. Delton Coombes  Orders Received/Actions taken: advise pt to continue to implement neutropenic precautions

## 2018-03-19 NOTE — Patient Instructions (Signed)
[facility] at [facility]  Discharge Instructions:   _______________________________________________________________  Thank you for choosing [facility] at [facility] to provide your oncology and hematology care.  To afford each patient quality time with our providers, please arrive at least 15 minutes before your scheduled appointment.  You need to re-schedule your appointment if you arrive 10 or more minutes late.  We strive to give you quality time with our providers, and arriving late affects you and other patients whose appointments are after yours.  Also, if you no show three or more times for appointments you may be dismissed from the clinic.  Again, thank you for choosing [facility] at [facility]. Our hope is that these requests will allow you access to exceptional care and in a timely manner. _______________________________________________________________  If you have questions after your visit, please contact our office at [phone] between the hours of 8:30 a.m. and 5:00 p.m. Voicemails left after 4:30 p.m. will not be returned until the following business day. _______________________________________________________________  For prescription refill requests, have your pharmacy contact our office. _______________________________________________________________  Recommendations made by the consultant and any test results will be sent to your referring physician. _______________________________________________________________ 

## 2018-03-19 NOTE — Progress Notes (Signed)
Kristin Good presented for PICC flush, dressing change, and lab work.  See IV assessment in docflowsheets for PICC details. PICC flushed with 73ml NS and 250U Heparin, see MAR for further details.  Kristin Good tolerated procedure well and without incident. Lab work reviewed with Dr. Delton Coombes who states pt is ok to be discharged. Discharged self ambulatory in satisfactory condition in presence of husband.

## 2018-03-19 NOTE — Patient Instructions (Signed)

## 2018-03-20 ENCOUNTER — Encounter (HOSPITAL_COMMUNITY): Payer: Self-pay

## 2018-03-23 ENCOUNTER — Inpatient Hospital Stay (HOSPITAL_COMMUNITY): Payer: Commercial Managed Care - PPO

## 2018-03-23 ENCOUNTER — Encounter (HOSPITAL_COMMUNITY)
Admission: RE | Admit: 2018-03-23 | Discharge: 2018-03-23 | Disposition: A | Discharge status: Home / Self Care | Payer: Commercial Managed Care - PPO | Source: Ambulatory Visit | Attending: General Surgery | Admitting: General Surgery

## 2018-03-23 ENCOUNTER — Encounter (HOSPITAL_COMMUNITY): Payer: Self-pay

## 2018-03-23 VITALS — BP 96/54 | HR 106 | Resp 20

## 2018-03-23 DIAGNOSIS — C8447 Peripheral T-cell lymphoma, not classified, spleen: Secondary | ICD-10-CM | POA: Diagnosis not present

## 2018-03-23 LAB — COMPREHENSIVE METABOLIC PANEL
ALK PHOS: 77 U/L (ref 38–126)
ALT: 16 U/L (ref 0–44)
AST: 14 U/L — ABNORMAL LOW (ref 15–41)
Albumin: 3.2 g/dL — ABNORMAL LOW (ref 3.5–5.0)
Anion gap: 9 (ref 5–15)
BUN: 21 mg/dL — ABNORMAL HIGH (ref 6–20)
CALCIUM: 8.9 mg/dL (ref 8.9–10.3)
CO2: 20 mmol/L — ABNORMAL LOW (ref 22–32)
Chloride: 109 mmol/L (ref 98–111)
Creatinine, Ser: 0.75 mg/dL (ref 0.44–1.00)
GFR calc Af Amer: >60 mL/min (ref >60)
Glucose, Bld: 236 mg/dL — ABNORMAL HIGH (ref 70–99)
Potassium: 4.3 mmol/L (ref 3.5–5.1)
Sodium: 138 mmol/L (ref 135–145)
Total Bilirubin: 0.3 mg/dL (ref 0.3–1.2)
Total Protein: 5.2 g/dL — ABNORMAL LOW (ref 6.5–8.1)

## 2018-03-23 LAB — CBC WITH DIFFERENTIAL/PLATELET
Abs Immature Granulocytes: 0.03 10*3/uL (ref 0.00–0.07)
Basophils Absolute: 0 10*3/uL (ref 0.0–0.1)
Basophils Relative: 0 %
Eosinophils Absolute: 0 10*3/uL (ref 0.0–0.5)
Eosinophils Relative: 0 %
HCT: 18.7 % — ABNORMAL LOW (ref 36.0–46.0)
Hemoglobin: 5.9 g/dL — CL (ref 12.0–15.0)
Immature Granulocytes: 6 %
Lymphocytes Relative: 36 %
Lymphs Abs: 0.2 10*3/uL — ABNORMAL LOW (ref 0.7–4.0)
MCH: 28.9 pg (ref 26.0–34.0)
MCHC: 31.6 g/dL (ref 30.0–36.0)
MCV: 91.7 fL (ref 80.0–100.0)
Monocytes Absolute: 0.1 10*3/uL (ref 0.1–1.0)
Monocytes Relative: 14 %
Neutro Abs: 0.2 10*3/uL — ABNORMAL LOW (ref 1.7–7.7)
Neutrophils Relative %: 44 %
Platelets: 10 10*3/uL — CL (ref 150–400)
RBC: 2.04 MIL/uL — ABNORMAL LOW (ref 3.87–5.11)
RDW: 18.1 % — ABNORMAL HIGH (ref 11.5–15.5)
WBC: 0.5 10*3/uL — CL (ref 4.0–10.5)
nRBC: 0 % (ref 0.0–0.2)

## 2018-03-23 LAB — PHOSPHORUS: Phosphorus: 3.3 mg/dL (ref 2.5–4.6)

## 2018-03-23 LAB — URIC ACID: Uric Acid, Serum: 3.4 mg/dL (ref 2.5–7.1)

## 2018-03-23 LAB — PREPARE RBC (CROSSMATCH)

## 2018-03-23 LAB — MAGNESIUM: Magnesium: 1.6 mg/dL — ABNORMAL LOW (ref 1.7–2.4)

## 2018-03-23 MED ORDER — SODIUM CHLORIDE 0.9% FLUSH
10.0000 mL | Freq: Once | INTRAVENOUS | Status: AC
  Administered 2018-03-23: 10 mL via INTRAVENOUS

## 2018-03-23 MED ORDER — HEPARIN SOD (PORK) LOCK FLUSH 100 UNIT/ML IV SOLN
500.0000 [IU] | Freq: Once | INTRAVENOUS | Status: AC
  Administered 2018-03-23: 500 [IU] via INTRAVENOUS

## 2018-03-23 NOTE — Progress Notes (Signed)
Labs reviewed with Dr. Delton Coombes with orders for 2 units of blood and 1 unit of platelets tomorrow.  Patient asymptomatic and no s/s of distress noted.  VSs with discharge and left ambulatory with family.

## 2018-03-23 NOTE — Progress Notes (Signed)
CRITICAL VALUE ALERT  Critical Value:  WBC 0.5; hgb 5.9; platelets 10  Date & Time Notied:  03/23/2018 at 1234  Provider Notified: Dr. Delton Coombes  Orders Received/Actions taken: transfuse 2 units irradiated PRBC and 1 unit irradiated platelets on 03/24/2018 - okay to do tomorrow per MD as pt is asymptomatic.

## 2018-03-24 ENCOUNTER — Other Ambulatory Visit: Payer: Self-pay

## 2018-03-24 ENCOUNTER — Inpatient Hospital Stay (HOSPITAL_COMMUNITY): Payer: Commercial Managed Care - PPO

## 2018-03-24 DIAGNOSIS — C8447 Peripheral T-cell lymphoma, not classified, spleen: Secondary | ICD-10-CM | POA: Diagnosis not present

## 2018-03-24 MED ORDER — ACETAMINOPHEN 325 MG PO TABS
650.0000 mg | ORAL_TABLET | Freq: Once | ORAL | Status: AC
  Administered 2018-03-24: 650 mg via ORAL
  Filled 2018-03-24: qty 2

## 2018-03-24 MED ORDER — SODIUM CHLORIDE 0.9% FLUSH
10.0000 mL | INTRAVENOUS | Status: AC | PRN
  Administered 2018-03-24: 10 mL

## 2018-03-24 MED ORDER — DIPHENHYDRAMINE HCL 25 MG PO CAPS
25.0000 mg | ORAL_CAPSULE | Freq: Once | ORAL | Status: AC
  Administered 2018-03-24: 25 mg via ORAL
  Filled 2018-03-24: qty 1

## 2018-03-24 MED ORDER — SODIUM CHLORIDE 0.9% IV SOLUTION
250.0000 mL | Freq: Once | INTRAVENOUS | Status: AC
  Administered 2018-03-24: 250 mL via INTRAVENOUS

## 2018-03-24 MED ORDER — HEPARIN SOD (PORK) LOCK FLUSH 100 UNIT/ML IV SOLN
250.0000 [IU] | INTRAVENOUS | Status: AC | PRN
  Administered 2018-03-24: 250 [IU]
  Filled 2018-03-24: qty 5

## 2018-03-24 NOTE — Patient Instructions (Signed)
[facility] at [facility]  Discharge Instructions:   _______________________________________________________________  Thank you for choosing [facility] at [facility] to provide your oncology and hematology care.  To afford each patient quality time with our providers, please arrive at least 15 minutes before your scheduled appointment.  You need to re-schedule your appointment if you arrive 10 or more minutes late.  We strive to give you quality time with our providers, and arriving late affects you and other patients whose appointments are after yours.  Also, if you no show three or more times for appointments you may be dismissed from the clinic.  Again, thank you for choosing [facility] at [facility]. Our hope is that these requests will allow you access to exceptional care and in a timely manner. _______________________________________________________________  If you have questions after your visit, please contact our office at [phone] between the hours of 8:30 a.m. and 5:00 p.m. Voicemails left after 4:30 p.m. will not be returned until the following business day. _______________________________________________________________  For prescription refill requests, have your pharmacy contact our office. _______________________________________________________________  Recommendations made by the consultant and any test results will be sent to your referring physician. _______________________________________________________________ 

## 2018-03-24 NOTE — Progress Notes (Signed)
2 units of blood and one unit of platelets given today per orders. Patient tolerated it well without problems. Vitals stable and discharged home from clinic ambulatory. Follow up as scheduled.

## 2018-03-25 LAB — PREPARE PLATELET PHERESIS: Unit division: 0

## 2018-03-25 LAB — BPAM PLATELET PHERESIS
Blood Product Expiration Date: 202003112359
ISSUE DATE / TIME: 202003101225
Unit Type and Rh: 7300

## 2018-03-26 ENCOUNTER — Other Ambulatory Visit: Payer: Self-pay

## 2018-03-26 ENCOUNTER — Other Ambulatory Visit (HOSPITAL_COMMUNITY): Payer: Commercial Managed Care - PPO

## 2018-03-26 ENCOUNTER — Inpatient Hospital Stay (HOSPITAL_COMMUNITY): Payer: Commercial Managed Care - PPO

## 2018-03-26 DIAGNOSIS — C8447 Peripheral T-cell lymphoma, not classified, spleen: Secondary | ICD-10-CM | POA: Diagnosis not present

## 2018-03-26 LAB — CBC WITH DIFFERENTIAL/PLATELET
Abs Immature Granulocytes: 0.04 10*3/uL (ref 0.00–0.07)
Basophils Absolute: 0 10*3/uL (ref 0.0–0.1)
Basophils Relative: 1 %
EOS PCT: 1 %
Eosinophils Absolute: 0 10*3/uL (ref 0.0–0.5)
HCT: 22.8 % — ABNORMAL LOW (ref 36.0–46.0)
HEMOGLOBIN: 7.4 g/dL — AB (ref 12.0–15.0)
Immature Granulocytes: 3 %
LYMPHS PCT: 20 %
Lymphs Abs: 0.2 10*3/uL — ABNORMAL LOW (ref 0.7–4.0)
MCH: 30.1 pg (ref 26.0–34.0)
MCHC: 32.5 g/dL (ref 30.0–36.0)
MCV: 92.7 fL (ref 80.0–100.0)
Monocytes Absolute: 0.2 10*3/uL (ref 0.1–1.0)
Monocytes Relative: 12 %
Neutro Abs: 0.8 10*3/uL — ABNORMAL LOW (ref 1.7–7.7)
Neutrophils Relative %: 63 %
Platelets: 26 10*3/uL — CL (ref 150–400)
RBC: 2.46 MIL/uL — ABNORMAL LOW (ref 3.87–5.11)
RDW: 17.1 % — ABNORMAL HIGH (ref 11.5–15.5)
WBC: 1.2 10*3/uL — CL (ref 4.0–10.5)
nRBC: 0 % (ref 0.0–0.2)

## 2018-03-26 LAB — COMPREHENSIVE METABOLIC PANEL
ALK PHOS: 83 U/L (ref 38–126)
ALT: 13 U/L (ref 0–44)
AST: 15 U/L (ref 15–41)
Albumin: 3.1 g/dL — ABNORMAL LOW (ref 3.5–5.0)
Anion gap: 10 (ref 5–15)
BUN: 15 mg/dL (ref 6–20)
CALCIUM: 8.3 mg/dL — AB (ref 8.9–10.3)
CO2: 22 mmol/L (ref 22–32)
Chloride: 109 mmol/L (ref 98–111)
Creatinine, Ser: 0.65 mg/dL (ref 0.44–1.00)
GFR calc Af Amer: >60 mL/min (ref >60)
GFR calc non Af Amer: >60 mL/min (ref >60)
GLUCOSE: 226 mg/dL — AB (ref 70–99)
Potassium: 3.3 mmol/L — ABNORMAL LOW (ref 3.5–5.1)
Sodium: 141 mmol/L (ref 135–145)
Total Bilirubin: 0.8 mg/dL (ref 0.3–1.2)
Total Protein: 5.1 g/dL — ABNORMAL LOW (ref 6.5–8.1)

## 2018-03-26 LAB — MAGNESIUM: Magnesium: 1.4 mg/dL — ABNORMAL LOW (ref 1.7–2.4)

## 2018-03-26 LAB — PREPARE RBC (CROSSMATCH)

## 2018-03-26 MED ORDER — SODIUM CHLORIDE 0.9% FLUSH
10.0000 mL | INTRAVENOUS | Status: AC | PRN
  Administered 2018-03-26: 10 mL

## 2018-03-26 MED ORDER — DIPHENHYDRAMINE HCL 25 MG PO CAPS
ORAL_CAPSULE | ORAL | Status: AC
  Filled 2018-03-26: qty 1

## 2018-03-26 MED ORDER — ACETAMINOPHEN 325 MG PO TABS
ORAL_TABLET | ORAL | Status: AC
  Filled 2018-03-26: qty 2

## 2018-03-26 MED ORDER — CHLORHEXIDINE GLUCONATE CLOTH 2 % EX PADS: 6.0000 | MEDICATED_PAD | Freq: Once | CUTANEOUS | Status: AC

## 2018-03-26 MED ORDER — LEVOFLOXACIN 500 MG PO TABS: 500.0000 mg | ORAL_TABLET | Freq: Every day | ORAL | 0 refills | Status: AC

## 2018-03-26 MED ORDER — SODIUM CHLORIDE 0.9 % IV SOLN
INTRAVENOUS | Status: DC
  Administered 2018-03-26: 12:00:00 via INTRAVENOUS

## 2018-03-26 MED ORDER — ACETAMINOPHEN 325 MG PO TABS
650.0000 mg | ORAL_TABLET | Freq: Once | ORAL | Status: AC
  Administered 2018-03-26: 650 mg via ORAL

## 2018-03-26 MED ORDER — HEPARIN SOD (PORK) LOCK FLUSH 100 UNIT/ML IV SOLN
250.0000 [IU] | INTRAVENOUS | Status: AC | PRN
  Administered 2018-03-26: 250 [IU]

## 2018-03-26 MED ORDER — SODIUM CHLORIDE 0.9% IV SOLUTION
250.0000 mL | Freq: Once | INTRAVENOUS | Status: AC
  Administered 2018-03-26: 250 mL via INTRAVENOUS

## 2018-03-26 MED ORDER — VANCOMYCIN HCL IN DEXTROSE 1-5 GM/200ML-% IV SOLN: 1000.0000 mg | INTRAVENOUS | Status: AC

## 2018-03-26 MED ORDER — MAGNESIUM SULFATE 2 GM/50ML IV SOLN
2.0000 g | Freq: Once | INTRAVENOUS | Status: AC
  Administered 2018-03-26: 2 g via INTRAVENOUS
  Filled 2018-03-26: qty 50

## 2018-03-26 MED ORDER — DIPHENHYDRAMINE HCL 25 MG PO CAPS
25.0000 mg | ORAL_CAPSULE | Freq: Once | ORAL | Status: AC
  Administered 2018-03-26: 25 mg via ORAL

## 2018-03-26 NOTE — Patient Instructions (Signed)
[place] at [facility]  Discharge Instructions:  Magnesium and one unit of blood given today.  _______________________________________________________________  Thank you for choosing [place] at [facility] to provide your oncology and hematology care.  To afford each patient quality time with our providers, please arrive at least 15 minutes before your scheduled appointment.  You need to re-schedule your appointment if you arrive 10 or more minutes late.  We strive to give you quality time with our providers, and arriving late affects you and other patients whose appointments are after yours.  Also, if you no show three or more times for appointments you may be dismissed from the clinic.  Again, thank you for choosing [place] at [place] hope is that these requests will allow you access to exceptional care and in a timely manner. _______________________________________________________________  If you have questions after your visit, please contact our office at [phone] between the hours of 8:30 a.m. and 5:00 p.m. Voicemails left after 4:30 p.m. will not be returned until the following business day. _______________________________________________________________  For prescription refill requests, have your pharmacy contact our office. _______________________________________________________________  Recommendations made by the consultant and any test results will be sent to your referring physician. _______________________________________________________________

## 2018-03-26 NOTE — Progress Notes (Signed)
MD reviewed blood work. Will give blood and magnesium as ordered.   Pt requesting another round of her levaquin, she is still coughing, sputum is clear, no fever or chills, no body aches. MD notified and refill given per MD.   Patient will not be getting her Port placed tomorrow due to her platelet counts. PICC line dressing change due today. Patient refused. Will do on Monday per Patient request.   One unit of blood given today. Magnesium given per orders.  Patient tolerated it well without problems. Vitals stable and discharged home from clinic ambulatory. Follow up as scheduled.

## 2018-03-26 NOTE — Progress Notes (Signed)
CRITICAL VALUE ALERT Critical value received:  WBC 1.2, Platelets 26,000 Date of notification:  03/26/2018 Time of notification: 8548 Critical value read back:  Yes.   Nurse who received alert:  Isidoro Donning RN MD notified (1st page):  Dr. Delton Coombes - he said okay

## 2018-03-27 ENCOUNTER — Ambulatory Visit (HOSPITAL_COMMUNITY)
Admission: RE | Admit: 2018-03-27 | Payer: Commercial Managed Care - PPO | Source: Home / Self Care | Admitting: General Surgery

## 2018-03-27 ENCOUNTER — Encounter (HOSPITAL_COMMUNITY): Admission: RE | Payer: Self-pay | Source: Home / Self Care

## 2018-03-27 SURGERY — INSERTION, TUNNELED CENTRAL VENOUS DEVICE, WITH PORT
Anesthesia: General | Laterality: Left

## 2018-03-30 ENCOUNTER — Other Ambulatory Visit (HOSPITAL_COMMUNITY): Payer: Commercial Managed Care - PPO

## 2018-03-30 ENCOUNTER — Other Ambulatory Visit: Payer: Self-pay

## 2018-03-30 ENCOUNTER — Inpatient Hospital Stay (HOSPITAL_COMMUNITY): Payer: Commercial Managed Care - PPO

## 2018-03-30 ENCOUNTER — Encounter (HOSPITAL_COMMUNITY): Payer: Self-pay

## 2018-03-30 DIAGNOSIS — C8447 Peripheral T-cell lymphoma, not classified, spleen: Secondary | ICD-10-CM

## 2018-03-30 LAB — COMPREHENSIVE METABOLIC PANEL
ALT: 11 U/L (ref 0–44)
AST: 16 U/L (ref 15–41)
Albumin: 3.4 g/dL — ABNORMAL LOW (ref 3.5–5.0)
Alkaline Phosphatase: 89 U/L (ref 38–126)
Anion gap: 10 (ref 5–15)
BUN: 13 mg/dL (ref 6–20)
CHLORIDE: 105 mmol/L (ref 98–111)
CO2: 26 mmol/L (ref 22–32)
Calcium: 8.8 mg/dL — ABNORMAL LOW (ref 8.9–10.3)
Creatinine, Ser: 0.63 mg/dL (ref 0.44–1.00)
GFR calc Af Amer: >60 mL/min (ref >60)
GFR calc non Af Amer: >60 mL/min (ref >60)
Glucose, Bld: 208 mg/dL — ABNORMAL HIGH (ref 70–99)
Potassium: 3.3 mmol/L — ABNORMAL LOW (ref 3.5–5.1)
Sodium: 141 mmol/L (ref 135–145)
Total Bilirubin: 0.8 mg/dL (ref 0.3–1.2)
Total Protein: 5.5 g/dL — ABNORMAL LOW (ref 6.5–8.1)

## 2018-03-30 LAB — SAMPLE TO BLOOD BANK

## 2018-03-30 LAB — CBC WITH DIFFERENTIAL/PLATELET
Abs Immature Granulocytes: 0.09 10*3/uL — ABNORMAL HIGH (ref 0.00–0.07)
Basophils Absolute: 0 10*3/uL (ref 0.0–0.1)
Basophils Relative: 1 %
Eosinophils Absolute: 0 10*3/uL (ref 0.0–0.5)
Eosinophils Relative: 0 %
HCT: 29.5 % — ABNORMAL LOW (ref 36.0–46.0)
Hemoglobin: 9.8 g/dL — ABNORMAL LOW (ref 12.0–15.0)
Immature Granulocytes: 5 %
Lymphocytes Relative: 15 %
Lymphs Abs: 0.3 10*3/uL — ABNORMAL LOW (ref 0.7–4.0)
MCH: 30.5 pg (ref 26.0–34.0)
MCHC: 33.2 g/dL (ref 30.0–36.0)
MCV: 91.9 fL (ref 80.0–100.0)
Monocytes Absolute: 0.2 10*3/uL (ref 0.1–1.0)
Monocytes Relative: 11 %
NEUTROS ABS: 1.3 10*3/uL — AB (ref 1.7–7.7)
Neutrophils Relative %: 68 %
PLATELETS: 53 10*3/uL — AB (ref 150–400)
RBC: 3.21 MIL/uL — ABNORMAL LOW (ref 3.87–5.11)
RDW: 18.3 % — ABNORMAL HIGH (ref 11.5–15.5)
WBC: 1.9 10*3/uL — ABNORMAL LOW (ref 4.0–10.5)
nRBC: 0 % (ref 0.0–0.2)

## 2018-03-30 MED ORDER — SODIUM CHLORIDE 0.9% FLUSH
10.0000 mL | Freq: Once | INTRAVENOUS | Status: AC
  Administered 2018-03-30: 10 mL via INTRAVENOUS

## 2018-03-30 MED ORDER — HEPARIN SOD (PORK) LOCK FLUSH 100 UNIT/ML IV SOLN
300.0000 [IU] | Freq: Once | INTRAVENOUS | Status: AC
  Administered 2018-03-30: 300 [IU] via INTRAVENOUS

## 2018-03-30 NOTE — Progress Notes (Signed)
PICC line flush with labs today. Platelets came back at 53,000 today. Dr. Arnoldo Morale notified so that she can get her port placed on Wednesday. They are too leave her accessed and come up to clinic after port is placed. Dr. Delton Coombes notified of the above mentioned.   Shyrl Numbers Seevers presented for PICC line flush. PICC line located right arm Good blood return present. PICC line flushed with 75ml NS and 300U/68ml Heparin. Procedure without incident. Patient tolerated procedure well.  Treatment given per orders. Patient tolerated it well without problems. Vitals stable and discharged home from clinic ambulatory. Follow up as scheduled.

## 2018-03-31 ENCOUNTER — Other Ambulatory Visit: Payer: Self-pay

## 2018-03-31 ENCOUNTER — Encounter (HOSPITAL_COMMUNITY)
Admission: RE | Admit: 2018-03-31 | Discharge: 2018-03-31 | Disposition: A | Discharge status: Home / Self Care | Payer: Commercial Managed Care - PPO | Source: Ambulatory Visit | Attending: General Surgery | Admitting: General Surgery

## 2018-03-31 ENCOUNTER — Encounter (HOSPITAL_COMMUNITY): Payer: Self-pay

## 2018-03-31 ENCOUNTER — Other Ambulatory Visit (HOSPITAL_COMMUNITY): Payer: Self-pay | Admitting: *Deleted

## 2018-03-31 DIAGNOSIS — C8447 Peripheral T-cell lymphoma, not classified, spleen: Secondary | ICD-10-CM

## 2018-04-01 ENCOUNTER — Ambulatory Visit (HOSPITAL_COMMUNITY): Payer: Commercial Managed Care - PPO

## 2018-04-01 ENCOUNTER — Encounter (HOSPITAL_COMMUNITY)
Admission: RE | Disposition: A | Discharge status: Home / Self Care | Payer: Self-pay | Source: Home / Self Care | Attending: General Surgery

## 2018-04-01 ENCOUNTER — Encounter (HOSPITAL_COMMUNITY): Payer: Self-pay

## 2018-04-01 ENCOUNTER — Ambulatory Visit (HOSPITAL_COMMUNITY): Payer: Commercial Managed Care - PPO | Admitting: Anesthesiology

## 2018-04-01 ENCOUNTER — Encounter (HOSPITAL_COMMUNITY): Payer: Self-pay | Admitting: Hematology

## 2018-04-01 ENCOUNTER — Encounter (HOSPITAL_COMMUNITY): Payer: Self-pay | Admitting: *Deleted

## 2018-04-01 ENCOUNTER — Ambulatory Visit (HOSPITAL_COMMUNITY)
Admission: RE | Admit: 2018-04-01 | Discharge: 2018-04-01 | Disposition: A | Discharge status: Home / Self Care | Payer: Commercial Managed Care - PPO | Attending: General Surgery | Admitting: General Surgery

## 2018-04-01 ENCOUNTER — Ambulatory Visit (HOSPITAL_BASED_OUTPATIENT_CLINIC_OR_DEPARTMENT_OTHER): Payer: Commercial Managed Care - PPO | Admitting: Hematology

## 2018-04-01 VITALS — BP 148/83 | HR 89 | Temp 98.0°F | Resp 18 | Wt 122.2 lb

## 2018-04-01 DIAGNOSIS — Z79890 Hormone replacement therapy (postmenopausal): Secondary | ICD-10-CM | POA: Insufficient documentation

## 2018-04-01 DIAGNOSIS — E039 Hypothyroidism, unspecified: Secondary | ICD-10-CM | POA: Insufficient documentation

## 2018-04-01 DIAGNOSIS — R161 Splenomegaly, not elsewhere classified: Secondary | ICD-10-CM

## 2018-04-01 DIAGNOSIS — E119 Type 2 diabetes mellitus without complications: Secondary | ICD-10-CM | POA: Insufficient documentation

## 2018-04-01 DIAGNOSIS — Z7984 Long term (current) use of oral hypoglycemic drugs: Secondary | ICD-10-CM | POA: Diagnosis not present

## 2018-04-01 DIAGNOSIS — Z856 Personal history of leukemia: Secondary | ICD-10-CM | POA: Insufficient documentation

## 2018-04-01 DIAGNOSIS — Z79899 Other long term (current) drug therapy: Secondary | ICD-10-CM | POA: Insufficient documentation

## 2018-04-01 DIAGNOSIS — I10 Essential (primary) hypertension: Secondary | ICD-10-CM | POA: Diagnosis not present

## 2018-04-01 DIAGNOSIS — R61 Generalized hyperhidrosis: Secondary | ICD-10-CM

## 2018-04-01 DIAGNOSIS — C8447 Peripheral T-cell lymphoma, not classified, spleen: Secondary | ICD-10-CM

## 2018-04-01 DIAGNOSIS — Z7982 Long term (current) use of aspirin: Secondary | ICD-10-CM | POA: Insufficient documentation

## 2018-04-01 DIAGNOSIS — E876 Hypokalemia: Secondary | ICD-10-CM

## 2018-04-01 DIAGNOSIS — Z85828 Personal history of other malignant neoplasm of skin: Secondary | ICD-10-CM | POA: Insufficient documentation

## 2018-04-01 DIAGNOSIS — Z95828 Presence of other vascular implants and grafts: Secondary | ICD-10-CM

## 2018-04-01 DIAGNOSIS — D61818 Other pancytopenia: Secondary | ICD-10-CM

## 2018-04-01 HISTORY — PX: PORTACATH PLACEMENT: SHX2246

## 2018-04-01 LAB — GLUCOSE, CAPILLARY: Glucose-Capillary: 168 mg/dL — ABNORMAL HIGH (ref 70–99)

## 2018-04-01 SURGERY — INSERTION, TUNNELED CENTRAL VENOUS DEVICE, WITH PORT
Anesthesia: General | Site: Chest | Laterality: Left

## 2018-04-01 MED ORDER — PROPOFOL 10 MG/ML IV BOLUS
INTRAVENOUS | Status: DC | PRN
  Administered 2018-04-01 (×2): 20 mg via INTRAVENOUS

## 2018-04-01 MED ORDER — HEPARIN SOD (PORK) LOCK FLUSH 100 UNIT/ML IV SOLN
500.0000 [IU] | Freq: Once | INTRAVENOUS | Status: AC | PRN
  Administered 2018-04-01: 500 [IU]

## 2018-04-01 MED ORDER — DOXORUBICIN HCL CHEMO IV INJECTION 2 MG/ML
50.0000 mg/m2 | Freq: Once | INTRAVENOUS | Status: AC
  Administered 2018-04-01: 76 mg via INTRAVENOUS
  Filled 2018-04-01: qty 38

## 2018-04-01 MED ORDER — VINCRISTINE SULFATE CHEMO INJECTION 1 MG/ML
2.0000 mg | Freq: Once | INTRAVENOUS | Status: AC
  Administered 2018-04-01: 2 mg via INTRAVENOUS
  Filled 2018-04-01: qty 2

## 2018-04-01 MED ORDER — MIDAZOLAM HCL 2 MG/2ML IJ SOLN: 0.5000 mg | Freq: Once | INTRAMUSCULAR | Status: DC | PRN

## 2018-04-01 MED ORDER — SODIUM CHLORIDE 0.9 % IV SOLN
Freq: Once | INTRAVENOUS | Status: AC
  Administered 2018-04-01: 12:00:00 via INTRAVENOUS

## 2018-04-01 MED ORDER — LIDOCAINE HCL (PF) 1 % IJ SOLN
INTRAMUSCULAR | Status: DC | PRN
  Administered 2018-04-01: 2 mL

## 2018-04-01 MED ORDER — LIDOCAINE-PRILOCAINE 2.5-2.5 % EX CREA: 1.0000 "application " | TOPICAL_CREAM | CUTANEOUS | 1 refills | Status: AC | PRN

## 2018-04-01 MED ORDER — KETAMINE HCL 50 MG/5ML IJ SOSY
PREFILLED_SYRINGE | INTRAMUSCULAR | Status: AC
  Filled 2018-04-01: qty 5

## 2018-04-01 MED ORDER — PALONOSETRON HCL INJECTION 0.25 MG/5ML
0.2500 mg | Freq: Once | INTRAVENOUS | Status: AC
  Administered 2018-04-01: 0.25 mg via INTRAVENOUS
  Filled 2018-04-01: qty 5

## 2018-04-01 MED ORDER — HYDROCODONE-ACETAMINOPHEN 5-325 MG PO TABS: 1.0000 | ORAL_TABLET | Freq: Four times a day (QID) | ORAL | 0 refills | Status: DC | PRN

## 2018-04-01 MED ORDER — HYDROMORPHONE HCL 1 MG/ML IJ SOLN: 0.2500 mg | INTRAMUSCULAR | Status: DC | PRN

## 2018-04-01 MED ORDER — LIDOCAINE 2% (20 MG/ML) 5 ML SYRINGE
INTRAMUSCULAR | Status: AC
  Filled 2018-04-01: qty 5

## 2018-04-01 MED ORDER — KETAMINE HCL 10 MG/ML IJ SOLN
INTRAMUSCULAR | Status: DC | PRN
  Administered 2018-04-01: 10 mg via INTRAVENOUS

## 2018-04-01 MED ORDER — HEPARIN SOD (PORK) LOCK FLUSH 100 UNIT/ML IV SOLN
INTRAVENOUS | Status: DC | PRN
  Administered 2018-04-01: 500 [IU] via INTRAVENOUS

## 2018-04-01 MED ORDER — CHLORHEXIDINE GLUCONATE CLOTH 2 % EX PADS: 6.0000 | MEDICATED_PAD | Freq: Once | CUTANEOUS | Status: DC

## 2018-04-01 MED ORDER — LACTATED RINGERS IV SOLN: INTRAVENOUS | Status: DC

## 2018-04-01 MED ORDER — SODIUM CHLORIDE 0.9 % IV SOLN
750.0000 mg/m2 | Freq: Once | INTRAVENOUS | Status: AC
  Administered 2018-04-01: 1140 mg via INTRAVENOUS
  Filled 2018-04-01: qty 25

## 2018-04-01 MED ORDER — SUCCINYLCHOLINE CHLORIDE 200 MG/10ML IV SOSY
PREFILLED_SYRINGE | INTRAVENOUS | Status: AC
  Filled 2018-04-01: qty 10

## 2018-04-01 MED ORDER — PROPOFOL 10 MG/ML IV BOLUS
INTRAVENOUS | Status: AC
  Filled 2018-04-01: qty 40

## 2018-04-01 MED ORDER — PROPOFOL 500 MG/50ML IV EMUL
INTRAVENOUS | Status: DC | PRN
  Administered 2018-04-01: 75 ug/kg/min via INTRAVENOUS

## 2018-04-01 MED ORDER — HEPARIN SOD (PORK) LOCK FLUSH 100 UNIT/ML IV SOLN
INTRAVENOUS | Status: AC
  Filled 2018-04-01: qty 5

## 2018-04-01 MED ORDER — PROMETHAZINE HCL 25 MG/ML IJ SOLN: 6.2500 mg | INTRAMUSCULAR | Status: DC | PRN

## 2018-04-01 MED ORDER — HYDROCODONE-ACETAMINOPHEN 7.5-325 MG PO TABS: 1.0000 | ORAL_TABLET | Freq: Once | ORAL | Status: DC | PRN

## 2018-04-01 MED ORDER — GLYCOPYRROLATE PF 0.2 MG/ML IJ SOSY
PREFILLED_SYRINGE | INTRAMUSCULAR | Status: AC
  Filled 2018-04-01: qty 1

## 2018-04-01 MED ORDER — LACTATED RINGERS IV SOLN
INTRAVENOUS | Status: DC | PRN
  Administered 2018-04-01: 10:00:00 via INTRAVENOUS

## 2018-04-01 MED ORDER — VANCOMYCIN HCL IN DEXTROSE 1-5 GM/200ML-% IV SOLN
1000.0000 mg | INTRAVENOUS | Status: AC
  Administered 2018-04-01: 1000 mg via INTRAVENOUS
  Filled 2018-04-01: qty 200

## 2018-04-01 MED ORDER — SODIUM CHLORIDE 0.9 % IV SOLN
100.0000 mg/m2 | Freq: Once | INTRAVENOUS | Status: AC
  Administered 2018-04-01: 150 mg via INTRAVENOUS
  Filled 2018-04-01: qty 7.5

## 2018-04-01 MED ORDER — SODIUM CHLORIDE (PF) 0.9 % IJ SOLN
INTRAMUSCULAR | Status: DC | PRN
  Administered 2018-04-01: 500 mL via INTRAVENOUS

## 2018-04-01 MED ORDER — LIDOCAINE HCL (PF) 1 % IJ SOLN
INTRAMUSCULAR | Status: AC
  Filled 2018-04-01: qty 30

## 2018-04-01 MED ORDER — SODIUM CHLORIDE 0.9% FLUSH
10.0000 mL | INTRAVENOUS | Status: DC | PRN
  Administered 2018-04-01: 10 mL
  Filled 2018-04-01: qty 10

## 2018-04-01 MED ORDER — SODIUM CHLORIDE 0.9 % IV SOLN
Freq: Once | INTRAVENOUS | Status: AC
  Administered 2018-04-01: 13:00:00 via INTRAVENOUS
  Filled 2018-04-01: qty 5

## 2018-04-01 SURGICAL SUPPLY — 27 items
BAG DECANTER FOR FLEXI CONT (MISCELLANEOUS) ×3 IMPLANT
CHLORAPREP W/TINT 10.5 ML (MISCELLANEOUS) ×3 IMPLANT
CLOTH BEACON ORANGE TIMEOUT ST (SAFETY) ×3 IMPLANT
COVER LIGHT HANDLE STERIS (MISCELLANEOUS) ×6 IMPLANT
DECANTER SPIKE VIAL GLASS SM (MISCELLANEOUS) ×3 IMPLANT
DERMABOND ADVANCED (GAUZE/BANDAGES/DRESSINGS) ×2
DERMABOND ADVANCED .7 DNX12 (GAUZE/BANDAGES/DRESSINGS) ×1 IMPLANT
DRAPE C-ARM FOLDED MOBILE STRL (DRAPES) ×3 IMPLANT
ELECT REM PT RETURN 9FT ADLT (ELECTROSURGICAL) ×3
ELECTRODE REM PT RTRN 9FT ADLT (ELECTROSURGICAL) ×1 IMPLANT
GLOVE BIOGEL PI IND STRL 7.0 (GLOVE) ×2 IMPLANT
GLOVE BIOGEL PI INDICATOR 7.0 (GLOVE) ×4
GLOVE SURG SS PI 7.5 STRL IVOR (GLOVE) ×3 IMPLANT
GOWN STRL REUS W/TWL LRG LVL3 (GOWN DISPOSABLE) ×6 IMPLANT
IV NS 500ML (IV SOLUTION) ×2
IV NS 500ML BAXH (IV SOLUTION) ×1 IMPLANT
KIT PORT POWER 8FR ISP MRI (Port) ×3 IMPLANT
KIT TURNOVER KIT A (KITS) ×3 IMPLANT
NEEDLE HYPO 25X1 1.5 SAFETY (NEEDLE) ×3 IMPLANT
PACK MINOR (CUSTOM PROCEDURE TRAY) ×3 IMPLANT
PAD ARMBOARD 7.5X6 YLW CONV (MISCELLANEOUS) ×3 IMPLANT
SET BASIN LINEN APH (SET/KITS/TRAYS/PACK) ×3 IMPLANT
SUT MNCRL AB 4-0 PS2 18 (SUTURE) ×3 IMPLANT
SUT VIC AB 3-0 SH 27 (SUTURE) ×2
SUT VIC AB 3-0 SH 27X BRD (SUTURE) ×1 IMPLANT
SYR 5ML LL (SYRINGE) ×3 IMPLANT
SYR CONTROL 10ML LL (SYRINGE) ×3 IMPLANT

## 2018-04-01 NOTE — Progress Notes (Signed)
Kristin Good, [place] [address]   CLINIC:  Medical Oncology/Hematology  PCP:  Marinda Elk, [place] [address] [phone]   REASON FOR VISIT:  Follow-up for Peripheral T cell Lymphoma.  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[place])    01/28/2018 -  Chemotherapy    The patient had DOXOrubicin (ADRIAMYCIN) chemo injection 50 mg, 30 mg/m2 = 50 mg (60 % of original dose 50 mg/m2), Intravenous,  Once, 4 of 8 cycles Dose modification: 30 mg/m2 (60 % of original dose 50 mg/m2, Cycle 1, Reason: Other (see comments), Comment: elevated bilirubin) Administration: 50 mg (01/28/2018), 76 mg (03/11/2018), 76 mg (02/18/2018) palonosetron (ALOXI) injection 0.25 mg, 0.25 mg, Intravenous,  Once, 4 of 8 cycles Administration: 0.25 mg (01/28/2018), 0.25 mg (02/18/2018), 0.25 mg (03/11/2018) pegfilgrastim (NEULASTA ONPRO KIT) injection 6 mg, 6 mg, Subcutaneous, Once, 4 of 8 cycles Administration: 6 mg (02/20/2018) vinCRIStine (ONCOVIN) 1 mg in sodium chloride 0.9 % 50 mL chemo infusion, 1 mg (50 % of original dose 2 mg), Intravenous,  Once, 4 of 8 cycles Dose modification: 1 mg (50 % of original dose 2 mg, Cycle 1, Reason: Other (see comments), Comment: liver dysfunctyion) Administration: 1 mg (01/28/2018), 2 mg (03/11/2018), 2 mg (02/18/2018) cyclophosphamide (CYTOXAN) 1,260 mg in sodium chloride 0.9 % 250 mL chemo infusion, 750 mg/m2 = 1,260 mg, Intravenous,  Once, 4 of 8 cycles Administration: 1,260 mg (01/28/2018), 1,140 mg (03/11/2018), 1,140 mg (02/18/2018) etoposide (VEPESID) 130 mg in sodium chloride 0.9 % 500 mL chemo infusion, 75 mg/m2 = 130 mg (75 % of original dose 100 mg/m2), Intravenous,  Once, 4 of 8 cycles Dose modification: 75 mg/m2 (75 % of original dose 100 mg/m2, Cycle 1, Reason:  Change in LFTs), 75 mg/m2 (75 % of original dose 100 mg/m2, Cycle 1, Reason: Change in LFTs) Administration: 130 mg (01/28/2018), 130 mg (01/29/2018), 130 mg (01/30/2018), 150 mg (02/19/2018), 150 mg (02/20/2018), 150 mg (03/12/2018), 150 mg (03/13/2018), 150 mg (03/11/2018), 150 mg (02/18/2018) fosaprepitant (EMEND) 150 mg, dexamethasone (DECADRON) 12 mg in sodium chloride 0.9 % 145 mL IVPB, , Intravenous,  Once, 4 of 6 cycles Administration:  (01/28/2018),  (02/18/2018),  (03/11/2018)  for chemotherapy treatment.       CANCER STAGING: Cancer Staging No matching staging information was found for the patient.   INTERVAL HISTORY:  Ms. Kristin Good 58 y.o. female returns for routine follow-up and consideration for next cycle of chemotherapy. She is here today with her husband. She states that she feels ok today. She states that she had some aching in her left hip and left leg last that started the last time she was here. She states that she still has some cough but not as bad as before. She states that she completed all of her antibiotics. Denies any nausea, vomiting, or diarrhea. Denies any new pains. Had not noticed any recent bleeding such as epistaxis, hematuria or hematochezia. Denies recent chest pain on exertion, shortness of breath on minimal exertion, pre-syncopal episodes, or palpitations. Denies any numbness or tingling in hands or feet.  Appetite is 100%.  Energy levels are 75%.   REVIEW OF SYSTEMS:  Review of Systems  All other systems reviewed and are negative.    PAST MEDICAL/SURGICAL HISTORY:  Past Medical History:  Diagnosis Date  Diabetes ([facility]) 01/20/2018   HTN (hypertension)    Hypothyroidism    Leukemia ([place])    Atypical T cell lymphoma   Past Surgical History:  Procedure Laterality Date   COLONOSCOPY  07/27/2010   Procedure: COLONOSCOPY;  Surgeon: Dorothyann Peng, MD;  Location: [facility];  Service: Endoscopy;  Laterality: N/A;   infertility surgery     MOHS SURGERY      basal cell carcinoma   REDUCTION MAMMAPLASTY Bilateral 2002     SOCIAL HISTORY:  Social History   Socioeconomic History   Marital status: Married    Spouse name: Not on file   Number of children: 0   Years of education: Not on file   Highest education level: Not on file  Occupational History   Occupation: [employer]    Comment: Nurse  Social Needs   Financial resource strain: Not on file   Food insecurity:    Worry: Not on file    Inability: Not on file   Transportation needs:    Medical: Not on file    Non-medical: Not on file  Tobacco Use   Smoking status: Never Smoker   Smokeless tobacco: Never Used  Substance and Sexual Activity   Alcohol use: Yes    Comment: once or twice a year   Drug use: No   Sexual activity: Not on file  Lifestyle   Physical activity:    Days per week: Not on file    Minutes per session: Not on file   Stress: Not on file  Relationships   Social connections:    Talks on phone: Not on file    Gets together: Not on file    Attends religious service: Not on file    Active member of club or organization: Not on file    Attends meetings of clubs or organizations: Not on file    Relationship status: Not on file   Intimate partner violence:    Fear of current or ex partner: Not on file    Emotionally abused: Not on file    Physically abused: Not on file    Forced sexual activity: Not on file  Other Topics Concern   Not on file  Social History Narrative   Not on file    FAMILY HISTORY:  Family History  Problem Relation Age of Onset   Heart disease Mother    Diabetes Father    Breast cancer Sister 18   Breast cancer Maternal Aunt    Colon cancer Neg Hx    Liver disease Neg Hx     CURRENT MEDICATIONS:  Facility-Administered Encounter Medications as of 04/01/2018  Medication   [COMPLETED] 0.9 %  sodium chloride infusion   Chlorhexidine Gluconate Cloth 2 % PADS 6 each   And   Chlorhexidine  Gluconate Cloth 2 % PADS 6 each   Chlorhexidine Gluconate Cloth 2 % PADS 6 each   And   Chlorhexidine Gluconate Cloth 2 % PADS 6 each   [COMPLETED] cyclophosphamide (CYTOXAN) 1,140 mg in sodium chloride 0.9 % 250 mL chemo infusion   [COMPLETED] DOXOrubicin (ADRIAMYCIN) chemo injection 76 mg   etoposide (VEPESID) 150 mg in sodium chloride 0.9 % 500 mL chemo infusion   [COMPLETED] fosaprepitant (EMEND) 150 mg, dexamethasone (DECADRON) 12 mg in sodium chloride 0.9 % 145 mL IVPB   heparin lock flush 100 unit/mL   HYDROcodone-acetaminophen (NORCO) 7.5-325 MG per tablet 1 tablet   HYDROmorphone (DILAUDID) injection 0.25-0.5 mg   lactated  ringers infusion   midazolam (VERSED) injection 0.5-2 mg   [COMPLETED] palonosetron (ALOXI) injection 0.25 mg   promethazine (PHENERGAN) injection 6.25-12.5 mg   sodium chloride flush (NS) 0.9 % injection 10 mL   sodium chloride flush (NS) 0.9 % injection 10 mL   [COMPLETED] vinCRIStine (ONCOVIN) 2 mg in sodium chloride 0.9 % 50 mL chemo infusion   Outpatient Encounter Medications as of 04/01/2018  Medication Sig Note   allopurinol (ZYLOPRIM) 300 MG tablet Take 1 tablet (300 mg total) by mouth daily.    atorvastatin (LIPITOR) 20 MG tablet Take 20 mg by mouth every morning.     calcium-vitamin D (CALCIUM 500+D HIGH POTENCY) 500-400 MG-UNIT tablet Take 1 tablet by mouth 2 (two) times daily. 03/23/2018: On hold   ciprofloxacin (CIPRO) 500 MG tablet Take 1 tablet (500 mg total) by mouth 2 (two) times daily. 02/02/2018: Prophylactic    cyclophosphamide in sodium chloride 0.9 % 250 mL Inject 1,260 mg into the vein every 21 ( twenty-one) days.    DOXOrubicin HCl (ADRIAMYCIN IV) Inject 84 mg into the vein every 21 ( twenty-one) days.    ETOPOSIDE IV Inject 170 mg into the vein every 21 ( twenty-one) days. Days 1-3    fluconazole (DIFLUCAN) 100 MG tablet Take 1 tablet (100 mg total) by mouth daily.    furosemide (LASIX) 20 MG tablet Take 1 tablet  (20 mg total) by mouth daily as needed.    Glucosamine-Chondroit-Vit C-Mn (GLUCOSAMINE 1500 COMPLEX PO) Take 1 capsule by mouth 2 (two) times daily. 03/23/2018: On hold   HYDROcodone-acetaminophen (NORCO) 5-325 MG tablet Take 1 tablet by mouth every 6 (six) hours as needed for moderate pain.    [EXPIRED] levofloxacin (LEVAQUIN) 500 MG tablet Take 1 tablet (500 mg total) by mouth daily for 5 days.    levothyroxine (SYNTHROID, LEVOTHROID) 75 MCG tablet Take 75 mcg by mouth daily before breakfast.     loperamide (IMODIUM) 2 MG capsule Take 2 mg by mouth as needed for diarrhea or loose stools.    magic mouthwash SOLN Take 5 mLs by mouth 3 (three) times daily as needed for mouth pain. With lidocaine 1% 1:1 solution    magnesium oxide (MAG-OX) 400 (241.3 Mg) MG tablet Take 1 tablet (400 mg total) by mouth 2 (two) times daily. (Patient taking differently: Take 400 mg by mouth 3 (three) times daily. )    metFORMIN (GLUCOPHAGE) 1000 MG tablet Take 1,000 mg by mouth 2 (two) times daily with a meal.     pegfilgrastim (NEULASTA) 6 MG/0.6ML injection Inject 6 mg into the skin See admin instructions. To be administered 3 days after chemo therapy treatment    predniSONE (DELTASONE) 20 MG tablet Take 5 tablets (100 mg total) by mouth daily. Take on days 1-5 of chemotherapy.    prochlorperazine (COMPAZINE) 10 MG tablet Take 1 tablet (10 mg total) by mouth every 6 (six) hours as needed for nausea or vomiting.    triamterene-hydrochlorothiazide (MAXZIDE-25) 37.5-25 MG per tablet Take 1 tablet by mouth daily.   03/23/2018: On hold   valACYclovir (VALTREX) 500 MG tablet Take 1 tablet (500 mg total) by mouth daily.    vinCRIStine 2 mg in sodium chloride 0.9 % 50 mL Inject 2 mg into the vein every 21 ( twenty-one) days.    vitamin B-12 (CYANOCOBALAMIN) 1000 MCG tablet Take 1 tablet (1,000 mcg total) by mouth daily.    vitamin C (ASCORBIC ACID) 500 MG tablet Take 500 mg by mouth 2 (two) times  daily. 03/23/2018: On  hold    ALLERGIES:  Allergies  Allergen Reactions   Doxycycline Anaphylaxis   Penicillins Anaphylaxis    Did it involve swelling of the face/tongue/throat, SOB, or low BP? Yes-SOB Did it involve sudden or severe rash/hives, skin peeling, or any reaction on the inside of your mouth or nose? Unknown Did you need to seek medical attention at a hospital or doctor's office? Yes When did it last happen?Over 10 years ago If all above answers are NO, may proceed with cephalosporin use.    Levofloxacin Nausea And Vomiting   Sulfa Antibiotics Itching and Rash     PHYSICAL EXAM:  ECOG Performance status: 1  I have reviewed her vitals.  Blood pressure is 148/83 pulse rate is 89, respirate is 18, temperature 98, saturations are 100.  Physical Exam Constitutional:      Appearance: Normal appearance.  Cardiovascular:     Rate and Rhythm: Normal rate and regular rhythm.  Pulmonary:     Effort: Pulmonary effort is normal. No respiratory distress.     Breath sounds: Normal breath sounds.  Abdominal:     General: Bowel sounds are normal. There is no distension.     Palpations: Abdomen is soft. There is no mass.  Musculoskeletal:        General: No swelling.  Skin:    General: Skin is warm.  Neurological:     General: No focal deficit present.     Mental Status: She is alert and oriented to person, place, and time.  Psychiatric:        Mood and Affect: Mood normal.        Behavior: Behavior normal.      LABORATORY DATA:  I have reviewed the labs as listed.  CBC    Component Value Date/Time   WBC 1.9 (L) 03/30/2018 1033   RBC 3.21 (L) 03/30/2018 1033   HGB 9.8 (L) 03/30/2018 1033   HCT 29.5 (L) 03/30/2018 1033   HCT 21.1 (L) 01/19/2018 0747   PLT 53 (L) 03/30/2018 1033   MCV 91.9 03/30/2018 1033   MCH 30.5 03/30/2018 1033   MCHC 33.2 03/30/2018 1033   RDW 18.3 (H) 03/30/2018 1033   LYMPHSABS 0.3 (L) 03/30/2018 1033   MONOABS 0.2 03/30/2018 1033   EOSABS 0.0  03/30/2018 1033   BASOSABS 0.0 03/30/2018 1033   CMP Latest Ref Rng & Units 03/30/2018 03/26/2018 03/23/2018  Glucose 70 - 99 mg/dL 208(H) 226(H) 236(H)  BUN 6 - 20 mg/dL 13 15 21(H)  Creatinine 0.44 - 1.00 mg/dL 0.63 0.65 0.75  Sodium 135 - 145 mmol/L 141 141 138  Potassium 3.5 - 5.1 mmol/L 3.3(L) 3.3(L) 4.3  Chloride 98 - 111 mmol/L 105 109 109  CO2 22 - 32 mmol/L 26 22 20(L)  Calcium 8.9 - 10.3 mg/dL 8.8(L) 8.3(L) 8.9  Total Protein 6.5 - 8.1 g/dL 5.5(L) 5.1(L) 5.2(L)  Total Bilirubin 0.3 - 1.2 mg/dL 0.8 0.8 0.3  Alkaline Phos 38 - 126 U/L 89 83 77  AST 15 - 41 U/L 16 15 14(L)  ALT 0 - 44 U/L '11 13 16       '$ DIAGNOSTIC IMAGING:  I have independently reviewed the scans and discussed with the patient.   I have reviewed Venita Lick LPN's note and agree with the documentation.  I personally performed a face-to-face visit, made revisions and my assessment and plan is as follows.    ASSESSMENT & PLAN:   Lymphoma, peripheral T-cell, spleen ([facility]) 1.  PTCL, NOS: -Presentation with pancytopenia, night sweats and fevers for last 1 to 2 months. - CT CAP shows splenomegaly with no lymphadenopathy. 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  T cells do not express TDT, CD1a, CD10, CD15, CD30, CD34, CD56, CD 117 or CD99.  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 - Prognostic index for PTCL (PIT) has 2 positive factors including serum LDH above normal and bone marrow involvement. - She is not a candidate for brentuximab-based regimen as CD30 was negative. -We talked about first-line therapy with CHOP-based regimen.  I have also talked to her about CHOEP regimen which has shown to be helpful for patients less than 60 and normal LDH. - We will obtain a baseline PET CT scan. - She has a PICC line placed for chemotherapy administration. -2D echo on 01/24/2018 shows  ejection fraction of 65 to 70%. - Received first cycle of CHOEP on 01/28/2018, Neulasta on 02/02/2018. - PET/CT scan on 02/02/2018 shows splenomegaly, with no hypermetabolism.  No enlarged or hypermetabolic lymphadenopathy in the neck, chest, abdomen or pelvis. -She met with Dr. Minna Antis at [facility] for consolidation transplantation. -She was admitted to the hospital on 02/07/2018 through 02/10/2018 with small bowel obstruction which resolved with conservative measures.  She did have neutropenic fever and received IV antibiotics.  Since discharge she denies any fevers or chills.  She had one episode of night sweats last night which is mild. - Cycle 2 on 02/18/2018 and cycle 3 on 03/11/2018. -She did not experience any chemotherapy related side effects after cycle 3. - She had a port placed this morning by Dr. Arnoldo Morale. -We have reviewed her blood work.  She may proceed with cycle 4 without any dose modifications.  -I will arrange for a PET CT scan on the 30th of this month.  I will also arrange for a bone marrow biopsy after the PET CT scan.  We will get in touch with Dr. Minna Antis after restaging studies.  If she is in CR, peripheral blood stem cells will be collected following cycle 5 of therapy.  If CR is not achieved, allogenic stem cell transplant using non-myeloablative approach will be considered.  HLA typing of the patient and her 3 siblings will be done at that point.   2.  Hypokalemia: -Potassium today 3.3.  3.  Hypomagnesemia: -She will continue magnesium 400 mg 3 times a day.        Orders placed this encounter:  No orders of the defined types were placed in this encounter.     Derek Jack, MD [place] [phone]

## 2018-04-01 NOTE — Progress Notes (Signed)
Patient transported to [facility] via wheelchair, Husband with patient and discharge instructions with patient husband.

## 2018-04-01 NOTE — Anesthesia Postprocedure Evaluation (Signed)
Anesthesia Post Note  Patient: Kristin Good  Procedure(s) Performed: INSERTION PORT-A-CATH (Left Chest)  Anesthesia Type: General Level of consciousness: awake and alert Pain management: pain level controlled Vital Signs Assessment: post-procedure vital signs reviewed and stable Respiratory status: spontaneous breathing Cardiovascular status: stable Postop Assessment: no apparent nausea or vomiting Anesthetic complications: no     Last Vitals:  Vitals:   04/01/18 0849  BP: 124/83  Pulse: 89  Resp: 18  Temp: 36.7 C  SpO2: 98%    Last Pain:  Vitals:   04/01/18 0849  TempSrc: Oral  PainSc: 0-No pain                 ADAMS, AMY A

## 2018-04-01 NOTE — Patient Instructions (Signed)
[place] at [facility] Discharge Instructions  You were seen today by Dr. Delton Coombes. He went over your recent lab results. He will see you back in 3 for labs, bone marrow biopsy and follow up.   Thank you for choosing [place] at [facility] to provide your oncology and hematology care.  To afford each patient quality time with our provider, please arrive at least 15 minutes before your scheduled appointment time.   If you have a lab appointment with the [facility] please come in thru the  Main Entrance and check in at the main information desk  You need to re-schedule your appointment should you arrive 10 or more minutes late.  We strive to give you quality time with our providers, and arriving late affects you and other patients whose appointments are after yours.  Also, if you no show three or more times for appointments you may be dismissed from the clinic at the providers discretion.     Again, thank you for choosing [facility].  Our hope is that these requests will decrease the amount of time that you wait before being seen by our physicians.       _____________________________________________________________  Should you have questions after your visit to [facility], please contact our office at [phone] between the hours of 8:00 a.m. and 4:30 p.m.  Voicemails left after 4:00 p.m. will not be returned until the following business day.  For prescription refill requests, have your pharmacy contact our office and allow 72 hours.    Cancer Center Support Programs:   > Cancer Support Group  2nd Tuesday of the month 1pm-2pm, Journey Room

## 2018-04-01 NOTE — Assessment & Plan Note (Signed)
1.  PTCL, NOS: -Presentation with pancytopenia, night sweats and fevers for last 1 to 2 months. - CT CAP shows splenomegaly with no lymphadenopathy. 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  T cells do not express TDT, CD1a, CD10, CD15, CD30, CD34, CD56, CD 117 or CD99.  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 - Prognostic index for PTCL (PIT) has 2 positive factors including serum LDH above normal and bone marrow involvement. - She is not a candidate for brentuximab-based regimen as CD30 was negative. -We talked about first-line therapy with CHOP-based regimen.  I have also talked to her about CHOEP regimen which has shown to be helpful for patients less than 60 and normal LDH. - We will obtain a baseline PET CT scan. - She has a PICC line placed for chemotherapy administration. -2D echo on 01/24/2018 shows ejection fraction of 65 to 70%. - Received first cycle of CHOEP on 01/28/2018, Neulasta on 02/02/2018. - PET/CT scan on 02/02/2018 shows splenomegaly, with no hypermetabolism.  No enlarged or hypermetabolic lymphadenopathy in the neck, chest, abdomen or pelvis. -She met with Dr. Minna Antis at [facility] for consolidation transplantation. -She was admitted to the hospital on 02/07/2018 through 02/10/2018 with small bowel obstruction which resolved with conservative measures.  She did have neutropenic fever and received IV antibiotics.  Since discharge she denies any fevers or chills.  She had one episode of night sweats last night which is mild. - Cycle 2 on 02/18/2018 and cycle 3 on 03/11/2018. -She did not experience any chemotherapy related side effects after cycle 3. - She had a port placed this morning by Dr. Arnoldo Morale. -We have reviewed her blood work.  She may proceed with cycle 4 without any dose modifications.  -I will arrange for a PET CT scan on the 30th of this month.   I will also arrange for a bone marrow biopsy after the PET CT scan.  We will get in touch with Dr. Minna Antis after restaging studies.  If she is in CR, peripheral blood stem cells will be collected following cycle 5 of therapy.  If CR is not achieved, allogenic stem cell transplant using non-myeloablative approach will be considered.  HLA typing of the patient and her 3 siblings will be done at that point.   2.  Hypokalemia: -Potassium today 3.3.  3.  Hypomagnesemia: -She will continue magnesium 400 mg 3 times a day.

## 2018-04-01 NOTE — Transfer of Care (Signed)
Immediate Anesthesia Transfer of Care Note  Patient: Kristin Good  Procedure(s) Performed: INSERTION PORT-A-CATH (Left Chest)  Patient Location: PACU  Anesthesia Type:MAC  Level of Consciousness: awake, alert , oriented and patient cooperative  Airway & Oxygen Therapy: Patient Spontanous Breathing  Post-op Assessment: Report given to RN and Post -op Vital signs reviewed and stable  Post vital signs: Reviewed and stable  Last Vitals:  Vitals Value Taken Time  BP 134/75 04/01/2018 11:21 AM  Temp    Pulse 85 04/01/2018 11:23 AM  Resp 15 04/01/2018 11:23 AM  SpO2 99 % 04/01/2018 11:23 AM  Vitals shown include unvalidated device data.  Last Pain:  Vitals:   04/01/18 0849  TempSrc: Oral  PainSc: 0-No pain      Patients Stated Pain Goal: 8 (88/82/80 0349)  Complications: No apparent anesthesia complications

## 2018-04-01 NOTE — Anesthesia Preprocedure Evaluation (Signed)
Anesthesia Evaluation  Patient identified by MRN, date of birth, ID band Patient awake    Reviewed: Allergy & Precautions, NPO status , Patient's Chart, lab work & pertinent test results  Airway Mallampati: II  TM Distance: >3 FB Neck ROM: Full    Dental no notable dental hx. (+) Teeth Intact   Pulmonary neg pulmonary ROS,    Pulmonary exam normal breath sounds clear to auscultation       Cardiovascular Exercise Tolerance: Good hypertension, Pt. on medications negative cardio ROS Normal cardiovascular examI Rhythm:Regular Rate:Normal     Neuro/Psych negative neurological ROS  negative psych ROS   GI/Hepatic negative GI ROS, Neg liver ROS,   Endo/Other  diabetesHypothyroidism   Renal/GU Renal InsufficiencyRenal disease  negative genitourinary   Musculoskeletal negative musculoskeletal ROS (+)   Abdominal   Peds negative pediatric ROS (+)  Hematology negative hematology ROS (+)   Anesthesia Other Findings H/o lymphoma for port  H/o Cancer as a teenager  Reproductive/Obstetrics negative OB ROS                             Anesthesia Physical Anesthesia Plan  ASA: III  Anesthesia Plan: General   Post-op Pain Management:    Induction: Intravenous  PONV Risk Score and Plan:   Airway Management Planned: Nasal Cannula and Simple Face Mask  Additional Equipment:   Intra-op Plan:   Post-operative Plan: Extubation in OR  Informed Consent: I have reviewed the patients History and Physical, chart, labs and discussed the procedure including the risks, benefits and alternatives for the proposed anesthesia with the patient or authorized representative who has indicated his/her understanding and acceptance.     Dental advisory given  Plan Discussed with: CRNA  Anesthesia Plan Comments: (MAC planned -GA/GETA as  Needed )        Anesthesia Quick Evaluation

## 2018-04-01 NOTE — Op Note (Signed)
Patient:  Kristin Good  DOB:  [birth year]  MRN:  [id number]   Preop Diagnosis: T-cell lymphoma of spleen  Postop Diagnosis: Same  Procedure: Port-A-Cath insertion  Surgeon: Aviva Signs, MD  Anes: MAC  Indications: Patient is a 58 year old white female who presents for Port-A-Cath.  She is undergoing chemotherapy for T-cell lymphoma of the spleen.  The risks and benefits of the procedure including bleeding, infection, and pneumothorax were fully explained to the patient, who gave informed consent.  Procedure note: The patient was placed in the Trendelenburg position after the left upper chest was prepped and draped using usual sterile technique with ChloraPrep.  Surgical site confirmation was performed.  1% Xylocaine was used for local anesthesia.  An incision was made below the left clavicle.  A subcutaneous pocket was formed.  A needle was advanced into the left subclavian vein using the Seldinger technique without difficulty.  A guidewire was then advanced into the right atrium under fluoroscopic guidance.  An introducer and peel-away sheath were placed over the guidewire.  The cath was inserted through the peel-away sheath and the peel-away sheath was removed.  The catheter was then attached to the port and the port placed in subcutaneous pocket.  Adequate positioning was confirmed by fluoroscopy.  Good backflow blood was noted on aspiration of the port.  The port was left accessed as the patient is undergoing chemotherapy today.  Subcutaneous layer was reapproximated using 3-0 Vicryl interrupted suture.  The skin was closed using a 4-0 Monocryl subcuticular suture.  Dermabond was applied.  All tape and needle counts were correct at the end of the procedure.  The patient was transferred to PACU in stable condition.  A chest x-ray will be performed at that time.  Complications: None  EBL: Minimal  Specimen: None

## 2018-04-01 NOTE — Interval H&P Note (Signed)
History and Physical Interval Note:  04/01/2018 10:21 AM  Kristin Good  has presented today for surgery, with the diagnosis of t-cell lymphoma.  The various methods of treatment have been discussed with the patient and family. After consideration of risks, benefits and other options for treatment, the patient has consented to  Procedure(s): INSERTION PORT-A-CATH (Left) as a surgical intervention.  The patient's history has been reviewed, patient examined, no change in status, stable for surgery.  I have reviewed the patient's chart and labs.  Questions were answered to the patient's satisfaction.     Aviva Signs

## 2018-04-01 NOTE — Progress Notes (Signed)
Patient here today from getting port placed. Will proceed with treatment per MD. Labs done on Monday.   Treatment given per orders. Patient tolerated it well without problems. Vitals stable and discharged home from clinic ambulatory. Follow up as scheduled.

## 2018-04-01 NOTE — Anesthesia Procedure Notes (Addendum)
Procedure Name: MAC Date/Time: 04/01/2018 10:48 AM Performed by: Andree Elk Amy A, CRNA Pre-anesthesia Checklist: Patient identified, Emergency Drugs available, Suction available, Timeout performed and Patient being monitored Patient Re-evaluated:Patient Re-evaluated prior to induction Oxygen Delivery Method: Nasal Cannula

## 2018-04-02 ENCOUNTER — Encounter (HOSPITAL_COMMUNITY): Payer: Self-pay | Admitting: General Surgery

## 2018-04-02 ENCOUNTER — Ambulatory Visit (HOSPITAL_COMMUNITY): Payer: Commercial Managed Care - PPO | Attending: Hematology

## 2018-04-02 VITALS — BP 139/72 | HR 87 | Temp 98.4°F | Resp 16 | Wt 122.0 lb

## 2018-04-02 DIAGNOSIS — Z5111 Encounter for antineoplastic chemotherapy: Secondary | ICD-10-CM | POA: Diagnosis not present

## 2018-04-02 DIAGNOSIS — C8447 Peripheral T-cell lymphoma, not classified, spleen: Secondary | ICD-10-CM | POA: Diagnosis present

## 2018-04-02 MED ORDER — HEPARIN SOD (PORK) LOCK FLUSH 100 UNIT/ML IV SOLN
INTRAVENOUS | Status: AC
  Filled 2018-04-02: qty 5

## 2018-04-02 MED ORDER — SODIUM CHLORIDE 0.9 % IV SOLN
INTRAVENOUS | Status: DC
  Administered 2018-04-02: 11:00:00 via INTRAVENOUS

## 2018-04-02 MED ORDER — HEPARIN SOD (PORK) LOCK FLUSH 100 UNIT/ML IV SOLN
500.0000 [IU] | Freq: Once | INTRAVENOUS | Status: AC
  Administered 2018-04-02: 500 [IU] via INTRAVENOUS

## 2018-04-02 MED ORDER — SODIUM CHLORIDE 0.9 % IV SOLN
100.0000 mg/m2 | Freq: Once | INTRAVENOUS | Status: AC
  Administered 2018-04-02: 150 mg via INTRAVENOUS
  Filled 2018-04-02: qty 7.5

## 2018-04-02 MED ORDER — SODIUM CHLORIDE 0.9 % IV SOLN
10.0000 mg | Freq: Once | INTRAVENOUS | Status: AC
  Administered 2018-04-02: 10 mg via INTRAVENOUS
  Filled 2018-04-02: qty 1

## 2018-04-02 NOTE — Progress Notes (Signed)
Treatment given per orders. Patient tolerated it well without problems. Vitals stable and discharged home from clinic ambulatory. Follow up as scheduled.  

## 2018-04-03 ENCOUNTER — Inpatient Hospital Stay (HOSPITAL_COMMUNITY): Payer: Commercial Managed Care - PPO

## 2018-04-03 ENCOUNTER — Other Ambulatory Visit: Payer: Self-pay

## 2018-04-03 ENCOUNTER — Encounter (HOSPITAL_COMMUNITY): Payer: Self-pay

## 2018-04-03 VITALS — BP 140/70 | HR 90 | Temp 98.0°F | Resp 18

## 2018-04-03 DIAGNOSIS — C8447 Peripheral T-cell lymphoma, not classified, spleen: Secondary | ICD-10-CM | POA: Diagnosis not present

## 2018-04-03 MED ORDER — SODIUM CHLORIDE 0.9% FLUSH
10.0000 mL | INTRAVENOUS | Status: DC | PRN
  Administered 2018-04-03: 10 mL via INTRAVENOUS
  Filled 2018-04-03: qty 10

## 2018-04-03 MED ORDER — PEGFILGRASTIM 6 MG/0.6ML ~~LOC~~ PSKT
PREFILLED_SYRINGE | SUBCUTANEOUS | Status: AC
  Filled 2018-04-03: qty 0.6

## 2018-04-03 MED ORDER — SODIUM CHLORIDE 0.9 % IV SOLN
100.0000 mg/m2 | Freq: Once | INTRAVENOUS | Status: AC
  Administered 2018-04-03: 150 mg via INTRAVENOUS
  Filled 2018-04-03: qty 7.5

## 2018-04-03 MED ORDER — SODIUM CHLORIDE 0.9 % IV SOLN
INTRAVENOUS | Status: DC
  Administered 2018-04-03: 10:00:00 via INTRAVENOUS

## 2018-04-03 MED ORDER — HEPARIN SOD (PORK) LOCK FLUSH 100 UNIT/ML IV SOLN
500.0000 [IU] | Freq: Once | INTRAVENOUS | Status: AC
  Administered 2018-04-03: 500 [IU] via INTRAVENOUS

## 2018-04-03 MED ORDER — SODIUM CHLORIDE 0.9 % IV SOLN
10.0000 mg | Freq: Once | INTRAVENOUS | Status: AC
  Administered 2018-04-03: 10 mg via INTRAVENOUS
  Filled 2018-04-03: qty 1

## 2018-04-03 MED ORDER — PEGFILGRASTIM 6 MG/0.6ML ~~LOC~~ PSKT
6.0000 mg | PREFILLED_SYRINGE | Freq: Once | SUBCUTANEOUS | Status: AC
  Administered 2018-04-03: 6 mg via SUBCUTANEOUS

## 2018-04-03 NOTE — Patient Instructions (Addendum)
[facility] Discharge Instructions for Patients Receiving Chemotherapy   Beginning January 23rd 2017 lab work for the Cancer Center will be done in the  Main lab at [facility] on 1st floor. If you have a lab appointment with the Cancer Center please come in thru the  Main Entrance and check in at the main information desk   Today you received the following chemotherapy agents VP-16 as well as Neulasta on-pro.Follow-up as scheduled. Call clinic for any questions or concerns  To help prevent nausea and vomiting after your treatment, we encourage you to take your nausea medication   If you develop nausea and vomiting, or diarrhea that is not controlled by your medication, call the clinic.  The clinic phone number is [phone]. Office hours are Monday-Friday 8:30am-5:00pm.  BELOW ARE SYMPTOMS THAT SHOULD BE REPORTED IMMEDIATELY:  *FEVER GREATER THAN 101.0 F  *CHILLS WITH OR WITHOUT FEVER  NAUSEA AND VOMITING THAT IS NOT CONTROLLED WITH YOUR NAUSEA MEDICATION  *UNUSUAL SHORTNESS OF BREATH  *UNUSUAL BRUISING OR BLEEDING  TENDERNESS IN MOUTH AND THROAT WITH OR WITHOUT PRESENCE OF ULCERS  *URINARY PROBLEMS  *BOWEL PROBLEMS  UNUSUAL RASH Items with * indicate a potential emergency and should be followed up as soon as possible. If you have an emergency after office hours please contact your primary care physician or go to the nearest emergency department.  Please call the clinic during office hours if you have any questions or concerns.   You may also contact the Patient Navigator at [phone] should you have any questions or need assistance in obtaining follow up care.      Resources For Cancer Patients and their Caregivers ? American Cancer Society: Can assist with transportation, wigs, general needs, runs Look Good Feel Better.        1-888-227-6333 ? Cancer Care: Provides financial assistance, online support groups, medication/co-pay assistance.   1-800-813-HOPE (4673) ? [facility] Assists [place] cancer patients and their families through emotional , educational and financial support.  336-427-4357 ? [place] DSS Where to apply for food stamps, Medicaid and utility assistance. 336-342-1394 ? [facility]: Transportation to medical appointments. 336-347-2287 ? Social Security Administration: May apply for disability if have a Stage IV cancer. 336-342-7796 1-800-772-1213 ? [place] Aging, Disability and Transit Services: Assists with nutrition, care and transit needs. 336-349-2343         

## 2018-04-03 NOTE — Progress Notes (Signed)
Kristin Good tolerated VP-16 infusion with Neulasta on-pro well without complaints or incident. VSS upon discharge. Neulasta on-pro applied to pt's right arm with green indicator light flashing upon discharge. Pt discharged self ambulatory in satisfactory condition

## 2018-04-04 ENCOUNTER — Other Ambulatory Visit (HOSPITAL_COMMUNITY): Payer: Self-pay | Admitting: Hematology

## 2018-04-04 DIAGNOSIS — C8447 Peripheral T-cell lymphoma, not classified, spleen: Secondary | ICD-10-CM

## 2018-04-06 DIAGNOSIS — C8447 Peripheral T-cell lymphoma, not classified, spleen: Secondary | ICD-10-CM | POA: Diagnosis not present

## 2018-04-06 LAB — TYPE AND SCREEN
ABO/RH(D): B POS
Antibody Screen: NEGATIVE
UNIT DIVISION: 0
Unit division: 0
Unit division: 0

## 2018-04-06 LAB — BPAM RBC
Blood Product Expiration Date: 202003132359
Blood Product Expiration Date: 202003272359
Blood Product Expiration Date: 202004012359
ISSUE DATE / TIME: 202003100843
ISSUE DATE / TIME: 202003101034
ISSUE DATE / TIME: 202003121315
UNIT TYPE AND RH: 5100
Unit Type and Rh: 5100
Unit Type and Rh: 5100

## 2018-04-07 ENCOUNTER — Inpatient Hospital Stay (HOSPITAL_COMMUNITY): Payer: Commercial Managed Care - PPO

## 2018-04-07 ENCOUNTER — Other Ambulatory Visit: Payer: Self-pay

## 2018-04-07 ENCOUNTER — Encounter (HOSPITAL_COMMUNITY): Payer: Self-pay | Admitting: *Deleted

## 2018-04-07 DIAGNOSIS — C8447 Peripheral T-cell lymphoma, not classified, spleen: Secondary | ICD-10-CM

## 2018-04-07 LAB — CBC WITH DIFFERENTIAL/PLATELET
Abs Immature Granulocytes: 0.13 10*3/uL — ABNORMAL HIGH (ref 0.00–0.07)
Basophils Absolute: 0 10*3/uL (ref 0.0–0.1)
Basophils Relative: 1 %
EOS PCT: 0 %
Eosinophils Absolute: 0 10*3/uL (ref 0.0–0.5)
HCT: 30.7 % — ABNORMAL LOW (ref 36.0–46.0)
Hemoglobin: 10.3 g/dL — ABNORMAL LOW (ref 12.0–15.0)
Immature Granulocytes: 14 %
LYMPHS PCT: 40 %
Lymphs Abs: 0.4 10*3/uL — ABNORMAL LOW (ref 0.7–4.0)
MCH: 30.5 pg (ref 26.0–34.0)
MCHC: 33.6 g/dL (ref 30.0–36.0)
MCV: 90.8 fL (ref 80.0–100.0)
Monocytes Absolute: 0 10*3/uL — ABNORMAL LOW (ref 0.1–1.0)
Monocytes Relative: 1 %
Neutro Abs: 0.4 10*3/uL — ABNORMAL LOW (ref 1.7–7.7)
Neutrophils Relative %: 44 %
Platelets: 56 10*3/uL — ABNORMAL LOW (ref 150–400)
RBC: 3.38 MIL/uL — ABNORMAL LOW (ref 3.87–5.11)
RDW: 17.9 % — ABNORMAL HIGH (ref 11.5–15.5)
WBC: 0.9 10*3/uL — CL (ref 4.0–10.5)
nRBC: 0 % (ref 0.0–0.2)

## 2018-04-07 LAB — COMPREHENSIVE METABOLIC PANEL
ALT: 19 U/L (ref 0–44)
AST: 13 U/L — ABNORMAL LOW (ref 15–41)
Albumin: 3.8 g/dL (ref 3.5–5.0)
Alkaline Phosphatase: 84 U/L (ref 38–126)
Anion gap: 14 (ref 5–15)
BUN: 52 mg/dL — ABNORMAL HIGH (ref 6–20)
CALCIUM: 9.3 mg/dL (ref 8.9–10.3)
CO2: 24 mmol/L (ref 22–32)
Chloride: 98 mmol/L (ref 98–111)
Creatinine, Ser: 0.94 mg/dL (ref 0.44–1.00)
GFR calc Af Amer: >60 mL/min (ref >60)
GFR calc non Af Amer: >60 mL/min (ref >60)
Glucose, Bld: 413 mg/dL — ABNORMAL HIGH (ref 70–99)
Potassium: 4.2 mmol/L (ref 3.5–5.1)
SODIUM: 136 mmol/L (ref 135–145)
Total Bilirubin: 0.9 mg/dL (ref 0.3–1.2)
Total Protein: 5.8 g/dL — ABNORMAL LOW (ref 6.5–8.1)

## 2018-04-07 LAB — MAGNESIUM: Magnesium: 1.8 mg/dL (ref 1.7–2.4)

## 2018-04-07 NOTE — Progress Notes (Signed)
CRITICAL VALUE ALERT  Critical Value:  WBC 0.9  Date & Time Notied:  04/07/2018 at 1103  Provider Notified: Dr. Delton Coombes  Orders Received/Actions taken: n/a    Pt presents to clinic today to await lab results.  She reports body aches that she is attributing to recent Neulasta injection. Denies fever, chills, shortness of breath.  C/o fatigue.  VSS.  Lab results printed and given to pt.  All results are stable/improved with exception to her blood glucose - pt has hx of diabetes and just finished days 1-5 of prednisone as part of her R-CHOP tx. Instructed to use her SSC insulin as prescribed and to continue her prophylactic abx as prescribed.  Instructed to call the clinic with any questions/concerns should she have any prior to her next scheduled appt. She verbalizes understanding of instructions given.  Discharged via wheelchair in c/o spouse.

## 2018-04-13 ENCOUNTER — Encounter (HOSPITAL_COMMUNITY): Payer: Commercial Managed Care - PPO

## 2018-04-13 ENCOUNTER — Other Ambulatory Visit: Payer: Self-pay

## 2018-04-13 ENCOUNTER — Encounter (HOSPITAL_COMMUNITY)
Admission: RE | Admit: 2018-04-13 | Discharge: 2018-04-13 | Disposition: A | Discharge status: Home / Self Care | Payer: Commercial Managed Care - PPO | Source: Ambulatory Visit | Attending: Hematology | Admitting: Hematology

## 2018-04-13 DIAGNOSIS — C8447 Peripheral T-cell lymphoma, not classified, spleen: Secondary | ICD-10-CM | POA: Insufficient documentation

## 2018-04-13 MED ORDER — FLUDEOXYGLUCOSE F - 18 (FDG) INJECTION
6.5000 | Freq: Once | INTRAVENOUS | Status: AC | PRN
  Administered 2018-04-13: 6.5 via INTRAVENOUS

## 2018-04-14 ENCOUNTER — Inpatient Hospital Stay (HOSPITAL_COMMUNITY): Payer: Commercial Managed Care - PPO

## 2018-04-14 ENCOUNTER — Other Ambulatory Visit: Payer: Self-pay

## 2018-04-14 ENCOUNTER — Encounter (HOSPITAL_COMMUNITY): Payer: Self-pay

## 2018-04-14 DIAGNOSIS — C8447 Peripheral T-cell lymphoma, not classified, spleen: Secondary | ICD-10-CM | POA: Diagnosis not present

## 2018-04-14 DIAGNOSIS — E876 Hypokalemia: Secondary | ICD-10-CM

## 2018-04-14 LAB — CBC WITH DIFFERENTIAL/PLATELET
Abs Immature Granulocytes: 0.02 10*3/uL (ref 0.00–0.07)
BASOS PCT: 1 %
Basophils Absolute: 0 10*3/uL (ref 0.0–0.1)
Eosinophils Absolute: 0 10*3/uL (ref 0.0–0.5)
Eosinophils Relative: 0 %
HCT: 21.3 % — ABNORMAL LOW (ref 36.0–46.0)
Hemoglobin: 7.1 g/dL — ABNORMAL LOW (ref 12.0–15.0)
Immature Granulocytes: 2 %
Lymphocytes Relative: 13 %
Lymphs Abs: 0.2 10*3/uL — ABNORMAL LOW (ref 0.7–4.0)
MCH: 30.6 pg (ref 26.0–34.0)
MCHC: 33.3 g/dL (ref 30.0–36.0)
MCV: 91.8 fL (ref 80.0–100.0)
Monocytes Absolute: 0.1 10*3/uL (ref 0.1–1.0)
Monocytes Relative: 12 %
Neutro Abs: 0.8 10*3/uL — ABNORMAL LOW (ref 1.7–7.7)
Neutrophils Relative %: 72 %
PLATELETS: 22 10*3/uL — AB (ref 150–400)
RBC: 2.32 MIL/uL — ABNORMAL LOW (ref 3.87–5.11)
RDW: 17.5 % — ABNORMAL HIGH (ref 11.5–15.5)
WBC: 1.1 10*3/uL — CL (ref 4.0–10.5)
nRBC: 0 % (ref 0.0–0.2)

## 2018-04-14 LAB — COMPREHENSIVE METABOLIC PANEL
ALT: 23 U/L (ref 0–44)
AST: 18 U/L (ref 15–41)
Albumin: 3.2 g/dL — ABNORMAL LOW (ref 3.5–5.0)
Alkaline Phosphatase: 101 U/L (ref 38–126)
Anion gap: 14 (ref 5–15)
BUN: 46 mg/dL — ABNORMAL HIGH (ref 6–20)
CO2: 20 mmol/L — ABNORMAL LOW (ref 22–32)
Calcium: 9.3 mg/dL (ref 8.9–10.3)
Chloride: 105 mmol/L (ref 98–111)
Creatinine, Ser: 1.18 mg/dL — ABNORMAL HIGH (ref 0.44–1.00)
GFR calc Af Amer: 58 mL/min — ABNORMAL LOW (ref >60)
GFR, EST NON AFRICAN AMERICAN: 50 mL/min — AB (ref >60)
Glucose, Bld: 374 mg/dL — ABNORMAL HIGH (ref 70–99)
Potassium: 3.3 mmol/L — ABNORMAL LOW (ref 3.5–5.1)
Sodium: 139 mmol/L (ref 135–145)
Total Bilirubin: 0.3 mg/dL (ref 0.3–1.2)
Total Protein: 5.8 g/dL — ABNORMAL LOW (ref 6.5–8.1)

## 2018-04-14 LAB — MAGNESIUM: MAGNESIUM: 2.1 mg/dL (ref 1.7–2.4)

## 2018-04-14 MED ORDER — SODIUM CHLORIDE 0.9% FLUSH
10.0000 mL | Freq: Once | INTRAVENOUS | Status: AC
  Administered 2018-04-14: 10 mL via INTRAVENOUS

## 2018-04-14 MED ORDER — SODIUM CHLORIDE 0.9 % IV SOLN
Freq: Once | INTRAVENOUS | Status: AC
  Administered 2018-04-14: 12:00:00 via INTRAVENOUS
  Filled 2018-04-14: qty 1000

## 2018-04-14 MED ORDER — ESCITALOPRAM OXALATE 10 MG PO TABS: 10.0000 mg | ORAL_TABLET | Freq: Every day | ORAL | 1 refills | Status: DC

## 2018-04-14 MED ORDER — HEPARIN SOD (PORK) LOCK FLUSH 100 UNIT/ML IV SOLN
500.0000 [IU] | Freq: Once | INTRAVENOUS | Status: AC
  Administered 2018-04-14: 500 [IU] via INTRAVENOUS

## 2018-04-14 NOTE — Progress Notes (Signed)
Hydration fluids given today per orders. Patient tolerated it well without problems. Vitals stable and discharged home from clinic ambulatory. Follow up as scheduled. 

## 2018-04-16 ENCOUNTER — Other Ambulatory Visit: Payer: Self-pay

## 2018-04-16 ENCOUNTER — Other Ambulatory Visit (HOSPITAL_COMMUNITY): Payer: Self-pay | Admitting: *Deleted

## 2018-04-16 ENCOUNTER — Encounter (HOSPITAL_COMMUNITY): Payer: Self-pay | Admitting: Hematology

## 2018-04-16 ENCOUNTER — Inpatient Hospital Stay (HOSPITAL_BASED_OUTPATIENT_CLINIC_OR_DEPARTMENT_OTHER): Payer: Commercial Managed Care - PPO

## 2018-04-16 ENCOUNTER — Inpatient Hospital Stay (HOSPITAL_COMMUNITY): Payer: Commercial Managed Care - PPO | Attending: Hematology | Admitting: Hematology

## 2018-04-16 ENCOUNTER — Inpatient Hospital Stay (HOSPITAL_COMMUNITY): Payer: Commercial Managed Care - PPO

## 2018-04-16 VITALS — BP 102/63 | HR 77 | Resp 16

## 2018-04-16 DIAGNOSIS — Z5111 Encounter for antineoplastic chemotherapy: Secondary | ICD-10-CM | POA: Insufficient documentation

## 2018-04-16 DIAGNOSIS — C8447 Peripheral T-cell lymphoma, not classified, spleen: Secondary | ICD-10-CM

## 2018-04-16 DIAGNOSIS — Z79899 Other long term (current) drug therapy: Secondary | ICD-10-CM

## 2018-04-16 DIAGNOSIS — R161 Splenomegaly, not elsewhere classified: Secondary | ICD-10-CM | POA: Diagnosis not present

## 2018-04-16 DIAGNOSIS — D61818 Other pancytopenia: Secondary | ICD-10-CM | POA: Insufficient documentation

## 2018-04-16 DIAGNOSIS — E876 Hypokalemia: Secondary | ICD-10-CM | POA: Diagnosis not present

## 2018-04-16 DIAGNOSIS — R000 Tachycardia, unspecified: Secondary | ICD-10-CM

## 2018-04-16 LAB — PREPARE RBC (CROSSMATCH)

## 2018-04-16 LAB — CBC
HCT: 19.1 % — ABNORMAL LOW (ref 36.0–46.0)
Hemoglobin: 6.4 g/dL — CL (ref 12.0–15.0)
MCH: 30.6 pg (ref 26.0–34.0)
MCHC: 33.5 g/dL (ref 30.0–36.0)
MCV: 91.4 fL (ref 80.0–100.0)
Platelets: 37 10*3/uL — ABNORMAL LOW (ref 150–400)
RBC: 2.09 MIL/uL — ABNORMAL LOW (ref 3.87–5.11)
RDW: 17.5 % — ABNORMAL HIGH (ref 11.5–15.5)
WBC: 1.8 10*3/uL — ABNORMAL LOW (ref 4.0–10.5)
nRBC: 0 % (ref 0.0–0.2)

## 2018-04-16 MED ORDER — ACETAMINOPHEN 325 MG PO TABS
650.0000 mg | ORAL_TABLET | Freq: Once | ORAL | Status: AC
  Administered 2018-04-16: 650 mg via ORAL

## 2018-04-16 MED ORDER — DIPHENHYDRAMINE HCL 25 MG PO CAPS
25.0000 mg | ORAL_CAPSULE | Freq: Once | ORAL | Status: AC
  Administered 2018-04-16: 25 mg via ORAL

## 2018-04-16 MED ORDER — LIDOCAINE HCL (PF) 1 % IJ SOLN
INTRAMUSCULAR | Status: AC
  Filled 2018-04-16: qty 5

## 2018-04-16 MED ORDER — DIPHENHYDRAMINE HCL 25 MG PO CAPS
ORAL_CAPSULE | ORAL | Status: AC
  Filled 2018-04-16: qty 1

## 2018-04-16 MED ORDER — SODIUM CHLORIDE 0.9% IV SOLUTION
250.0000 mL | Freq: Once | INTRAVENOUS | Status: AC
  Administered 2018-04-16: 250 mL via INTRAVENOUS

## 2018-04-16 MED ORDER — HEPARIN SOD (PORK) LOCK FLUSH 100 UNIT/ML IV SOLN
500.0000 [IU] | Freq: Every day | INTRAVENOUS | Status: AC | PRN
  Administered 2018-04-16: 500 [IU]

## 2018-04-16 MED ORDER — SODIUM CHLORIDE 0.9% FLUSH
10.0000 mL | INTRAVENOUS | Status: AC | PRN
  Administered 2018-04-16: 10 mL

## 2018-04-16 MED ORDER — ACETAMINOPHEN 325 MG PO TABS
ORAL_TABLET | ORAL | Status: AC
  Filled 2018-04-16: qty 2

## 2018-04-16 NOTE — Progress Notes (Signed)
Kristin Good, Kristin Good [id number]   CLINIC:  Medical Oncology/Hematology  PCP:  Kristin Good, [place] [id number] [phone]   REASON FOR VISIT:  Follow-up for Peripheral T cell Lymphoma.  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[place])    01/28/2018 -  Chemotherapy    The patient had DOXOrubicin (ADRIAMYCIN) chemo injection 50 mg, 30 mg/m2 = 50 mg (60 % of original dose 50 mg/m2), Intravenous,  Once, 4 of 8 cycles Dose modification: 30 mg/m2 (60 % of original dose 50 mg/m2, Cycle 1, Reason: Other (see comments), Comment: elevated bilirubin) Administration: 50 mg (01/28/2018), 76 mg (03/11/2018), 76 mg (02/18/2018), 76 mg (04/01/2018) palonosetron (ALOXI) injection 0.25 mg, 0.25 mg, Intravenous,  Once, 4 of 8 cycles Administration: 0.25 mg (01/28/2018), 0.25 mg (02/18/2018), 0.25 mg (03/11/2018), 0.25 mg (04/01/2018) pegfilgrastim (NEULASTA ONPRO KIT) injection 6 mg, 6 mg, Subcutaneous, Once, 4 of 8 cycles Administration: 6 mg (02/20/2018), 6 mg (04/03/2018) vinCRIStine (ONCOVIN) 1 mg in sodium chloride 0.9 % 50 mL chemo infusion, 1 mg (50 % of original dose 2 mg), Intravenous,  Once, 4 of 8 cycles Dose modification: 1 mg (50 % of original dose 2 mg, Cycle 1, Reason: Other (see comments), Comment: liver dysfunctyion) Administration: 1 mg (01/28/2018), 2 mg (03/11/2018), 2 mg (02/18/2018), 2 mg (04/01/2018) cyclophosphamide (CYTOXAN) 1,260 mg in sodium chloride 0.9 % 250 mL chemo infusion, 750 mg/m2 = 1,260 mg, Intravenous,  Once, 4 of 8 cycles Administration: 1,260 mg (01/28/2018), 1,140 mg (03/11/2018), 1,140 mg (02/18/2018), 1,140 mg (04/01/2018) etoposide (VEPESID) 130 mg in sodium chloride 0.9 % 500 mL chemo infusion, 75 mg/m2 = 130 mg (75 % of original dose 100 mg/m2), Intravenous,   Once, 4 of 8 cycles Dose modification: 75 mg/m2 (75 % of original dose 100 mg/m2, Cycle 1, Reason: Change in LFTs), 75 mg/m2 (75 % of original dose 100 mg/m2, Cycle 1, Reason: Change in LFTs) Administration: 130 mg (01/28/2018), 130 mg (01/29/2018), 130 mg (01/30/2018), 150 mg (02/19/2018), 150 mg (02/20/2018), 150 mg (03/12/2018), 150 mg (03/13/2018), 150 mg (04/02/2018), 150 mg (04/03/2018), 150 mg (03/11/2018), 150 mg (02/18/2018), 150 mg (04/01/2018) fosaprepitant (EMEND) 150 mg, dexamethasone (DECADRON) 12 mg in sodium chloride 0.9 % 145 mL IVPB, , Intravenous,  Once, 4 of 6 cycles Administration:  (01/28/2018),  (02/18/2018),  (03/11/2018),  (04/01/2018)  for chemotherapy treatment.       CANCER STAGING: Cancer Staging No matching staging information was found for the patient.   INTERVAL HISTORY:  [name] 58 y.o. female returns for follow-up of her lymphoma.  She received 4 cycle on 04/01/2018.  She denies any nausea vomiting or diarrhea.  Energy levels are adequate.  Denies any fevers, night sweats or weight loss.  Denies any new onset pains.  No fevers or infections reported.  No ER visits or hospital admissions.  No spontaneous bleeding reported.  She does have easy bruising.  Mild fatigue present.  Appetite is 100%.  Energy levels are 75%.   REVIEW OF SYSTEMS:  Review of Systems  Constitutional: Positive for fatigue.  All other systems reviewed and are negative.    PAST MEDICAL/SURGICAL HISTORY:  Past Medical History:  Diagnosis Date  . Diabetes ([place]) 01/20/2018  . HTN (hypertension)   . Hypothyroidism   . 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 COLONOSCOPY  07/27/2010   Procedure: COLONOSCOPY;  Surgeon: Dorothyann Peng, MD;  Location: [facility] ENDO SUITE;  Service: Endoscopy;  Laterality: N/A;  . infertility surgery    . MOHS SURGERY     basal cell carcinoma  . PORTACATH PLACEMENT Left 04/01/2018   Procedure: INSERTION PORT-A-CATH;   Surgeon: Aviva Signs, MD;  Location: [facility] ORS;  Service: General;  Laterality: Left;  . REDUCTION MAMMAPLASTY Bilateral 2002     SOCIAL HISTORY:  Social History   Socioeconomic History  . Marital status: Married    Spouse name: Not on file  . Number of children: 0  . Years of education: Not on file  . Highest education level: Not on file  Occupational History  . Occupation: [employer]    Comment: Nurse  Social Needs  . Financial resource strain: Not on file  . Food insecurity:    Worry: Not on file    Inability: Not on file  . Transportation needs:    Medical: Not on file    Non-medical: Not on file  Tobacco Use  . Smoking status: Never Smoker  . Smokeless tobacco: Never Used  Substance and Sexual Activity  . Alcohol use: Yes    Comment: once or twice a year  . Drug use: No  . Sexual activity: Not on file  Lifestyle  . Physical activity:    Days per week: Not on file    Minutes per session: Not on file  . Stress: Not on file  Relationships  . Social connections:    Talks on phone: Not on file    Gets together: Not on file    Attends religious service: Not on file    Active member of club or organization: Not on file    Attends meetings of clubs or organizations: Not on file    Relationship status: Not on file  . Intimate partner violence:    Fear of current or ex partner: Not on file    Emotionally abused: Not on file    Physically abused: Not on file    Forced sexual activity: Not on file  Other Topics Concern  . Not on file  Social History Narrative  . Not on file    FAMILY HISTORY:  Family History  Problem Relation Age of Onset  . Heart disease Mother   . Diabetes Father   . Breast cancer Sister 12  . Breast cancer Maternal Aunt   . Colon cancer Neg Hx   . Liver disease Neg Hx     CURRENT MEDICATIONS:  Outpatient Encounter Medications as of 04/16/2018  Medication Sig Note  . allopurinol (ZYLOPRIM) 300 MG tablet Take 1 tablet (300 mg  total) by mouth daily.   Marland Kitchen atorvastatin (LIPITOR) 20 MG tablet Take 20 mg by mouth every morning.    . calcium-vitamin D (CALCIUM 500+D HIGH POTENCY) 500-400 MG-UNIT tablet Take 1 tablet by mouth 2 (two) times daily. 03/23/2018: On hold  . ciprofloxacin (CIPRO) 500 MG tablet Take 1 tablet (500 mg total) by mouth 2 (two) times daily. 02/02/2018: Prophylactic   . cyclophosphamide in sodium chloride 0.9 % 250 mL Inject 1,260 mg into the vein every 21 ( twenty-one) days.   Marland Kitchen DOXOrubicin HCl (ADRIAMYCIN IV) Inject 84 mg into the vein every 21 ( twenty-one) days.   Marland Kitchen escitalopram (LEXAPRO) 10 MG tablet Take 1 tablet (10 mg total) by  mouth daily.   . ETOPOSIDE IV Inject 170 mg into the vein every 21 ( twenty-one) days. Days 1-3   . fluconazole (DIFLUCAN) 100 MG tablet Take 1 tablet (100 mg total) by mouth daily.   . furosemide (LASIX) 20 MG tablet Take 1 tablet (20 mg total) by mouth daily as needed.   . Glucosamine-Chondroit-Vit C-Mn (GLUCOSAMINE 1500 COMPLEX PO) Take 1 capsule by mouth 2 (two) times daily. 03/23/2018: On hold  . HYDROcodone-acetaminophen (NORCO) 5-325 MG tablet Take 1 tablet by mouth every 6 (six) hours as needed for moderate pain.   Marland Kitchen levothyroxine (SYNTHROID, LEVOTHROID) 75 MCG tablet Take 75 mcg by mouth daily before breakfast.    . lidocaine-prilocaine (EMLA) cream Apply 1 application topically as needed.   . loperamide (IMODIUM) 2 MG capsule Take 2 mg by mouth as needed for diarrhea or loose stools.   . magic mouthwash SOLN Take 5 mLs by mouth 3 (three) times daily as needed for mouth pain. With lidocaine 1% 1:1 solution   . magnesium oxide (MAG-OX) 400 (241.3 Mg) MG tablet Take 1 tablet (400 mg total) by mouth 2 (two) times daily. (Patient taking differently: Take 400 mg by mouth 3 (three) times daily. )   . metFORMIN (GLUCOPHAGE) 1000 MG tablet Take 1,000 mg by mouth 2 (two) times daily with a meal.    . pegfilgrastim (NEULASTA) 6 MG/0.6ML injection Inject 6 mg into the skin See admin  instructions. To be administered 3 days after chemo therapy treatment   . potassium chloride SA (K-DUR,KLOR-CON) 20 MEQ tablet    . predniSONE (DELTASONE) 20 MG tablet TAKE 5 TABLETS BY MOUTH ONCE DAILY ON DAYS 1-5 OF CHEMOTHERAPY.   Marland Kitchen prochlorperazine (COMPAZINE) 10 MG tablet Take 1 tablet (10 mg total) by mouth every 6 (six) hours as needed for nausea or vomiting.   . triamterene-hydrochlorothiazide (MAXZIDE-25) 37.5-25 MG per tablet Take 1 tablet by mouth daily.   03/23/2018: On hold  . valACYclovir (VALTREX) 500 MG tablet Take 1 tablet (500 mg total) by mouth daily.   . vinCRIStine 2 mg in sodium chloride 0.9 % 50 mL Inject 2 mg into the vein every 21 ( twenty-one) days.   . vitamin B-12 (CYANOCOBALAMIN) 1000 MCG tablet Take 1 tablet (1,000 mcg total) by mouth daily.   . vitamin C (ASCORBIC ACID) 500 MG tablet Take 500 mg by mouth 2 (two) times daily. 03/23/2018: On hold   Facility-Administered Encounter Medications as of 04/16/2018  Medication  . [COMPLETED] 0.9 %  sodium chloride infusion (Manually program via Guardrails IV Fluids)  . [COMPLETED] acetaminophen (TYLENOL) tablet 650 mg  . Chlorhexidine Gluconate Cloth 2 % PADS 6 each   And  . Chlorhexidine Gluconate Cloth 2 % PADS 6 each  . [COMPLETED] diphenhydrAMINE (BENADRYL) capsule 25 mg  . [COMPLETED] heparin lock flush 100 unit/mL  . sodium chloride flush (NS) 0.9 % injection 10 mL  . [COMPLETED] sodium chloride flush (NS) 0.9 % injection 10 mL  . lidocaine (PF) (XYLOCAINE) 1 % injection  . acetaminophen (TYLENOL) 325 MG tablet  . diphenhydrAMINE (BENADRYL) 25 mg capsule    ALLERGIES:  Allergies  Allergen Reactions  . Doxycycline Anaphylaxis  . Penicillins Anaphylaxis    Did it involve swelling of the face/tongue/throat, SOB, or low BP? Yes-SOB Did it involve sudden or severe rash/hives, skin peeling, or any reaction on the inside of your mouth or nose? Unknown Did you need to seek medical attention at a hospital or doctor's  office? Yes When  did it last happen?Over 10 years ago If all above answers are "NO", may proceed with cephalosporin use.   . Levofloxacin Nausea And Vomiting  . Sulfa Antibiotics Itching and Rash     PHYSICAL EXAM:  ECOG Performance status: 1  I have reviewed her vitals.  Blood pressure is 148/83 pulse rate is 89, respirate is 18, temperature 98, saturations are 100.  Physical Exam Constitutional:      Appearance: Normal appearance.  Cardiovascular:     Rate and Rhythm: Normal rate and regular rhythm.  Pulmonary:     Effort: Pulmonary effort is normal. No respiratory distress.     Breath sounds: Normal breath sounds.  Abdominal:     General: Bowel sounds are normal. There is no distension.     Palpations: Abdomen is soft. There is no mass.  Musculoskeletal:        General: No swelling.  Skin:    General: Skin is warm.  Neurological:     General: No focal deficit present.     Mental Status: She is alert and oriented to person, place, and time.  Psychiatric:        Mood and Affect: Mood normal.        Behavior: Behavior normal.      LABORATORY DATA:  I have reviewed the labs as listed.  CBC    Component Value Date/Time   WBC 1.8 (L) 04/16/2018 1043   RBC 2.09 (L) 04/16/2018 1043   HGB 6.4 (LL) 04/16/2018 1043   HCT 19.1 (L) 04/16/2018 1043   HCT 21.1 (L) 01/19/2018 0747   PLT 37 (L) 04/16/2018 1043   MCV 91.4 04/16/2018 1043   MCH 30.6 04/16/2018 1043   MCHC 33.5 04/16/2018 1043   RDW 17.5 (H) 04/16/2018 1043   LYMPHSABS 0.2 (L) 04/14/2018 1100   MONOABS 0.1 04/14/2018 1100   EOSABS 0.0 04/14/2018 1100   BASOSABS 0.0 04/14/2018 1100   CMP Latest Ref Rng & Units 04/14/2018 04/07/2018 03/30/2018  Glucose 70 - 99 mg/dL 374(H) 413(H) 208(H)  BUN 6 - 20 mg/dL 46(H) 52(H) 13  Creatinine 0.44 - 1.00 mg/dL 1.18(H) 0.94 0.63  Sodium 135 - 145 mmol/L 139 136 141  Potassium 3.5 - 5.1 mmol/L 3.3(L) 4.2 3.3(L)  Chloride 98 - 111 mmol/L 105 98 105  CO2 22 - 32  mmol/L 20(L) 24 26  Calcium 8.9 - 10.3 mg/dL 9.3 9.3 8.8(L)  Total Protein 6.5 - 8.1 g/dL 5.8(L) 5.8(L) 5.5(L)  Total Bilirubin 0.3 - 1.2 mg/dL 0.3 0.9 0.8  Alkaline Phos 38 - 126 U/L 101 84 89  AST 15 - 41 U/L 18 13(L) 16  ALT 0 - 44 U/L '23 19 11       '$ DIAGNOSTIC IMAGING:  I have independently reviewed the scans and discussed with the patient.   I have reviewed Venita Lick LPN's note and agree with the documentation.  I personally performed a face-to-face visit, made revisions and my assessment and plan is as follows.    ASSESSMENT & PLAN:   Lymphoma, peripheral T-cell, spleen (H[facility]) 1.  PTCL, NOS: -Presentation with pancytopenia, night sweats and fevers for last 1 to 2 months. - CT CAP shows splenomegaly with no lymphadenopathy. 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  T cells do not express TDT, CD1a, CD10, CD15, CD30, CD34, CD56, CD 117 or CD99.  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 -  Prognostic index for PTCL (PIT) has 2 positive factors including serum LDH above normal and bone marrow involvement. - She is not a candidate for brentuximab-based regimen as CD30 was negative. -We talked about first-line therapy with CHOP-based regimen.  I have also talked to her about CHOEP regimen which has shown to be helpful for patients less than 60 and normal LDH. - We will obtain a baseline PET CT scan. - She has a PICC line placed for chemotherapy administration. -2D echo on 01/24/2018 shows ejection fraction of 65 to 70%. -PET/CT scan on 02/02/2018 shows splenomegaly with no hypermetabolism.  No enlarged or hypermetabolic adenopathy in the neck chest abdomen or pelvis. - 4 cycles of CHOEP from 01/28/2018 through 04/01/2018. -She met with Dr. Minna Antis at [facility] for consolidation transplant. -I discussed the results of the PET CT scan dated 04/13/2018  showing no FDG mass or adenopathy.  Decrease in size of the spleen measuring 13 cm, previously 16 cm.  Considerable heterogeneous uptake throughout the axial and appendicular skeleton most notable in the thoracic and lumbar spine.  I think this is from growth factor. -We will proceed with a bone marrow aspiration biopsy today. -If she is in CR, peripheral blood stem cells will be collected following cycle 5 of therapy.  If CR is not achieved, allogenic stem cell transplant using non-myeloablative approach will be considered.  HLA typing of the patient and her 3 siblings will be done at that point. -I will see her back next Tuesday to discuss results of the bone marrow biopsy.  I will reach out to Dr. Minna Antis once I get the results of the bone marrow.  #2 hypomagnesemia: -She will continue magnesium 400 3 times daily.        Orders placed this encounter:  Orders Placed This Encounter  Procedures  . [place], [place] [phone]

## 2018-04-16 NOTE — Progress Notes (Signed)
Dressing site is CDI, no swelling noted.  Patient tolerated it well without problems. Vitals stable and discharged home from clinic ambulatory. Follow up as scheduled.

## 2018-04-16 NOTE — Assessment & Plan Note (Signed)
1.  PTCL, NOS: -Presentation with pancytopenia, night sweats and fevers for last 1 to 2 months. - CT CAP shows splenomegaly with no lymphadenopathy. 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  T cells do not express TDT, CD1a, CD10, CD15, CD30, CD34, CD56, CD 117 or CD99.  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 - Prognostic index for PTCL (PIT) has 2 positive factors including serum LDH above normal and bone marrow involvement. - She is not a candidate for brentuximab-based regimen as CD30 was negative. -We talked about first-line therapy with CHOP-based regimen.  I have also talked to her about CHOEP regimen which has shown to be helpful for patients less than 60 and normal LDH. - We will obtain a baseline PET CT scan. - She has a PICC line placed for chemotherapy administration. -2D echo on 01/24/2018 shows ejection fraction of 65 to 70%. -PET/CT scan on 02/02/2018 shows splenomegaly with no hypermetabolism.  No enlarged or hypermetabolic adenopathy in the neck chest abdomen or pelvis. - 4 cycles of CHOEP from 01/28/2018 through 04/01/2018. -She met with Dr. Minna Antis at [facility] for consolidation transplant. -I discussed the results of the PET CT scan dated 04/13/2018 showing no FDG mass or adenopathy.  Decrease in size of the spleen measuring 13 cm, previously 16 cm.  Considerable heterogeneous uptake throughout the axial and appendicular skeleton most notable in the thoracic and lumbar spine.  I think this is from growth factor. -We will proceed with a bone marrow aspiration biopsy today. -If she is in CR, peripheral blood stem cells will be collected following cycle 5 of therapy.  If CR is not achieved, allogenic stem cell transplant using non-myeloablative approach will be considered.  HLA typing of the patient and her 3 siblings will be done at that point. -I will see  her back next Tuesday to discuss results of the bone marrow biopsy.  I will reach out to Dr. Minna Antis once I get the results of the bone marrow.  #2 hypomagnesemia: -She will continue magnesium 400 3 times daily.

## 2018-04-16 NOTE — Progress Notes (Signed)
INDICATION: Follow-up of lymphoma after 4 cycles of chemotherapy.   Bone Marrow Biopsy and Aspiration Procedure Note   The patient was identified by name and date of birth, prior to start of the procedure and a timeout was performed.   An informed consent was obtained after discussing potential risks including bleeding, infection and pain.  The right posterior iliac crest was palpated, cleaned with ChloraPrep, and drapes applied.  1% lidocaine is infiltrated into the skin, subcutaneous tissue and periosteum.  Bone marrow was aspirated and smears made.  With the help of Jamshidi needle a core biopsy was obtained.  Pressure was applied to the biopsy site and bandage was placed over the biopsy site. Patient was made to lie on the back for 15 mins prior to discharge.  The procedure was tolerated well. COMPLICATIONS: None BLOOD LOSS: none Patient was discharged home in stable condition to return in 2 weeks to review results.  Patient was provided with post bone marrow biopsy instructions and instructed to call if there was any bleeding or worsening pain.  Specimens sent for flow cytometry, cytogenetics and additional studies.  Signed Derek Jack, MD

## 2018-04-16 NOTE — Progress Notes (Signed)
Pt here today for bone marrow biopsy. Consent signed at 10:02. Time out conducted at 10:05. Procedure started at 10:09. First biopsy was draw, no aspirate or sample obtained, left hip area. Procedure completed at 10:20 Procedure started at 10:26 on right hip. Specimen obtained. Procedure complete at 10:31. Pt tolerated procedure well with some discomfort. Bandages applied to both areas and pt placed on her back.

## 2018-04-16 NOTE — Progress Notes (Signed)
I received notification that patient's hgb is 6.4.    I called Dr. Delton Coombes and received orders for 1 unit of blood today.    Patient was notified and is coming back to the unit now to get her infusion.

## 2018-04-17 LAB — TYPE AND SCREEN
ABO/RH(D): B POS
Antibody Screen: NEGATIVE
Unit division: 0

## 2018-04-17 LAB — BPAM RBC
Blood Product Expiration Date: 202004202359
ISSUE DATE / TIME: 202004021416
Unit Type and Rh: 5100

## 2018-04-17 NOTE — Progress Notes (Signed)
One unit of blood given per orders.    Vitals stable and discharged home from clinic ambulatory. Follow up as scheduled.

## 2018-04-17 NOTE — Patient Instructions (Signed)
[facility] at [facility]  Discharge Instructions:   _______________________________________________________________  Thank you for choosing [facility] at [facility] to provide your oncology and hematology care.  To afford each patient quality time with our providers, please arrive at least 15 minutes before your scheduled appointment.  You need to re-schedule your appointment if you arrive 10 or more minutes late.  We strive to give you quality time with our providers, and arriving late affects you and other patients whose appointments are after yours.  Also, if you no show three or more times for appointments you may be dismissed from the clinic.  Again, thank you for choosing [facility] at [facility]. Our hope is that these requests will allow you access to exceptional care and in a timely manner. _______________________________________________________________  If you have questions after your visit, please contact our office at [phone] between the hours of 8:30 a.m. and 5:00 p.m. Voicemails left after 4:30 p.m. will not be returned until the following business day. _______________________________________________________________  For prescription refill requests, have your pharmacy contact our office. _______________________________________________________________  Recommendations made by the consultant and any test results will be sent to your referring physician. _______________________________________________________________ 

## 2018-04-20 ENCOUNTER — Other Ambulatory Visit: Payer: Self-pay

## 2018-04-21 ENCOUNTER — Encounter (HOSPITAL_COMMUNITY): Payer: Self-pay

## 2018-04-21 ENCOUNTER — Inpatient Hospital Stay (HOSPITAL_COMMUNITY): Payer: Commercial Managed Care - PPO

## 2018-04-21 ENCOUNTER — Inpatient Hospital Stay (HOSPITAL_BASED_OUTPATIENT_CLINIC_OR_DEPARTMENT_OTHER): Payer: Commercial Managed Care - PPO | Admitting: Hematology

## 2018-04-21 ENCOUNTER — Encounter (HOSPITAL_COMMUNITY): Payer: Self-pay | Admitting: Hematology

## 2018-04-21 VITALS — BP 133/70 | HR 87 | Temp 97.6°F | Resp 16 | Wt 112.2 lb

## 2018-04-21 DIAGNOSIS — D61818 Other pancytopenia: Secondary | ICD-10-CM

## 2018-04-21 DIAGNOSIS — R161 Splenomegaly, not elsewhere classified: Secondary | ICD-10-CM

## 2018-04-21 DIAGNOSIS — C8447 Peripheral T-cell lymphoma, not classified, spleen: Secondary | ICD-10-CM

## 2018-04-21 DIAGNOSIS — Z79899 Other long term (current) drug therapy: Secondary | ICD-10-CM

## 2018-04-21 LAB — CBC WITH DIFFERENTIAL/PLATELET
Abs Immature Granulocytes: 0.23 10*3/uL — ABNORMAL HIGH (ref 0.00–0.07)
Basophils Absolute: 0 10*3/uL (ref 0.0–0.1)
Basophils Relative: 1 %
Eosinophils Absolute: 0 10*3/uL (ref 0.0–0.5)
Eosinophils Relative: 0 %
HCT: 26.5 % — ABNORMAL LOW (ref 36.0–46.0)
Hemoglobin: 8.7 g/dL — ABNORMAL LOW (ref 12.0–15.0)
Immature Granulocytes: 7 %
Lymphocytes Relative: 8 %
Lymphs Abs: 0.3 10*3/uL — ABNORMAL LOW (ref 0.7–4.0)
MCH: 31 pg (ref 26.0–34.0)
MCHC: 32.8 g/dL (ref 30.0–36.0)
MCV: 94.3 fL (ref 80.0–100.0)
Monocytes Absolute: 0.3 10*3/uL (ref 0.1–1.0)
Monocytes Relative: 8 %
Neutro Abs: 2.6 10*3/uL (ref 1.7–7.7)
Neutrophils Relative %: 76 %
Platelets: 98 10*3/uL — ABNORMAL LOW (ref 150–400)
RBC: 2.81 MIL/uL — ABNORMAL LOW (ref 3.87–5.11)
RDW: 19.8 % — ABNORMAL HIGH (ref 11.5–15.5)
WBC Morphology: INCREASED
WBC: 3.3 10*3/uL — ABNORMAL LOW (ref 4.0–10.5)
nRBC: 0 % (ref 0.0–0.2)

## 2018-04-21 LAB — SAMPLE TO BLOOD BANK

## 2018-04-21 LAB — COMPREHENSIVE METABOLIC PANEL
ALT: 9 U/L (ref 0–44)
AST: 14 U/L — ABNORMAL LOW (ref 15–41)
Albumin: 3.5 g/dL (ref 3.5–5.0)
Alkaline Phosphatase: 87 U/L (ref 38–126)
Anion gap: 12 (ref 5–15)
BUN: 18 mg/dL (ref 6–20)
CO2: 22 mmol/L (ref 22–32)
Calcium: 8.8 mg/dL — ABNORMAL LOW (ref 8.9–10.3)
Chloride: 107 mmol/L (ref 98–111)
Creatinine, Ser: 0.83 mg/dL (ref 0.44–1.00)
GFR calc Af Amer: >60 mL/min (ref >60)
GFR calc non Af Amer: >60 mL/min (ref >60)
Glucose, Bld: 221 mg/dL — ABNORMAL HIGH (ref 70–99)
Potassium: 4.1 mmol/L (ref 3.5–5.1)
Sodium: 141 mmol/L (ref 135–145)
Total Bilirubin: 0.7 mg/dL (ref 0.3–1.2)
Total Protein: 5.7 g/dL — ABNORMAL LOW (ref 6.5–8.1)

## 2018-04-21 MED ORDER — VINCRISTINE SULFATE CHEMO INJECTION 1 MG/ML
2.0000 mg | Freq: Once | INTRAVENOUS | Status: AC
  Administered 2018-04-21: 13:00:00 2 mg via INTRAVENOUS
  Filled 2018-04-21: qty 2

## 2018-04-21 MED ORDER — PALONOSETRON HCL INJECTION 0.25 MG/5ML
0.2500 mg | Freq: Once | INTRAVENOUS | Status: AC
  Administered 2018-04-21: 0.25 mg via INTRAVENOUS
  Filled 2018-04-21: qty 5

## 2018-04-21 MED ORDER — HEPARIN SOD (PORK) LOCK FLUSH 100 UNIT/ML IV SOLN
500.0000 [IU] | Freq: Once | INTRAVENOUS | Status: AC | PRN
  Administered 2018-04-21: 500 [IU]

## 2018-04-21 MED ORDER — SODIUM CHLORIDE 0.9% FLUSH
10.0000 mL | INTRAVENOUS | Status: DC | PRN
  Administered 2018-04-21: 10 mL
  Filled 2018-04-21: qty 10

## 2018-04-21 MED ORDER — SODIUM CHLORIDE 0.9 % IV SOLN
Freq: Once | INTRAVENOUS | Status: AC
  Administered 2018-04-21: 12:00:00 via INTRAVENOUS
  Filled 2018-04-21: qty 5

## 2018-04-21 MED ORDER — DOXORUBICIN HCL CHEMO IV INJECTION 2 MG/ML
50.0000 mg/m2 | Freq: Once | INTRAVENOUS | Status: AC
  Administered 2018-04-21: 76 mg via INTRAVENOUS
  Filled 2018-04-21: qty 38

## 2018-04-21 MED ORDER — SODIUM CHLORIDE 0.9 % IV SOLN
Freq: Once | INTRAVENOUS | Status: AC
  Administered 2018-04-21: 11:00:00 via INTRAVENOUS

## 2018-04-21 MED ORDER — SODIUM CHLORIDE 0.9 % IV SOLN
750.0000 mg/m2 | Freq: Once | INTRAVENOUS | Status: AC
  Administered 2018-04-21: 14:00:00 1140 mg via INTRAVENOUS
  Filled 2018-04-21: qty 25

## 2018-04-21 MED ORDER — SODIUM CHLORIDE 0.9 % IV SOLN
100.0000 mg/m2 | Freq: Once | INTRAVENOUS | Status: AC
  Administered 2018-04-21: 150 mg via INTRAVENOUS
  Filled 2018-04-21: qty 7.5

## 2018-04-21 NOTE — Patient Instructions (Signed)
[facility] Discharge Instructions for Patients Receiving Chemotherapy  Today you received the following chemotherapy agents   To help prevent nausea and vomiting after your treatment, we encourage you to take your nausea medication   If you develop nausea and vomiting that is not controlled by your nausea medication, call the clinic.   BELOW ARE SYMPTOMS THAT SHOULD BE REPORTED IMMEDIATELY:  *FEVER GREATER THAN 100.5 F  *CHILLS WITH OR WITHOUT FEVER  NAUSEA AND VOMITING THAT IS NOT CONTROLLED WITH YOUR NAUSEA MEDICATION  *UNUSUAL SHORTNESS OF BREATH  *UNUSUAL BRUISING OR BLEEDING  TENDERNESS IN MOUTH AND THROAT WITH OR WITHOUT PRESENCE OF ULCERS  *URINARY PROBLEMS  *BOWEL PROBLEMS  UNUSUAL RASH Items with * indicate a potential emergency and should be followed up as soon as possible.  Feel free to call the clinic should you have any questions or concerns. The clinic phone number is [phone].  Please show the CHEMO ALERT CARD at check-in to the Emergency Department and triage nurse.   

## 2018-04-21 NOTE — Assessment & Plan Note (Signed)
1.  PTCL, NOS: -Presentation with pancytopenia, night sweats and fevers for last 1 to 2 months. - CT CAP shows splenomegaly with no lymphadenopathy. 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  T cells do not express TDT, CD1a, CD10, CD15, CD30, CD34, CD56, CD 117 or CD99.  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 - Prognostic index for PTCL (PIT) has 2 positive factors including serum LDH above normal and bone marrow involvement. - She is not a candidate for brentuximab-based regimen as CD30 was negative. -We talked about first-line therapy with CHOP-based regimen.  I have also talked to her about CHOEP regimen which has shown to be helpful for patients less than 60 and normal LDH. - We will obtain a baseline PET CT scan. - She has a PICC line placed for chemotherapy administration. -2D echo on 01/24/2018 shows ejection fraction of 65 to 70%. -PET/CT scan on 02/02/2018 shows splenomegaly with no hypermetabolism.  No enlarged or hypermetabolic adenopathy in the neck chest abdomen or pelvis. - 4 cycles of CHOEP from 01/28/2018 through 04/01/2018. -She met with Dr. Minna Antis at [facility] for consolidation transplant. -PET/CT scan on 04/13/2018 shows no FDG mass or adenopathy.  Decrease in size of the spleen measuring 13 cm, previously 16 cm.  Considerable heterogeneous uptake throughout the axial and appendicular skeleton most notably in the thoracic and lumbar spine.  I think this is from G-CSF. -She had a bone marrow biopsy done on 04/16/2018.  I called and talked to Dr. Melina Copa and was informed that there was some fibrosis with mildly decreased cellularity however T-cell population still present. -I have called and talked to Dr. Minna Antis at [facility].  Plan is to proceed with cycle 5 today.  As she did not get complete response, allogenic transplant will be considered. -Patient felt very  well after her last cycle.  Did not experience any major side effects. -Today she will proceed with her chemotherapy.  We will continue to monitor her labs weekly. - I will see her back in 3 weeks for follow-up.  I plan to follow-up on the bone marrow biopsy results.  2.  Hypomagnesemia: -We will continue magnesium 400 mg 3 times daily.

## 2018-04-21 NOTE — Patient Instructions (Signed)
[facility] at [facility] Discharge Instructions  You were seen today by Dr. Katragadda. He went over your recent lab results. He will see you back in 3 weeks for labs, treatment and follow up.   Thank you for choosing [facility] at [facility] to provide your oncology and hematology care.  To afford each patient quality time with our provider, please arrive at least 15 minutes before your scheduled appointment time.   If you have a lab appointment with the Cancer Center please come in thru the  Main Entrance and check in at the main information desk  You need to re-schedule your appointment should you arrive 10 or more minutes late.  We strive to give you quality time with our providers, and arriving late affects you and other patients whose appointments are after yours.  Also, if you no show three or more times for appointments you may be dismissed from the clinic at the providers discretion.     Again, thank you for choosing [facility].  Our hope is that these requests will decrease the amount of time that you wait before being seen by our physicians.       _____________________________________________________________  Should you have questions after your visit to [facility], please contact our office at [phone] between the hours of 8:00 a.m. and 4:30 p.m.  Voicemails left after 4:00 p.m. will not be returned until the following business day.  For prescription refill requests, have your pharmacy contact our office and allow 72 hours.    Cancer Center Support Programs:   > Cancer Support Group  2nd Tuesday of the month 1pm-2pm, Journey Room    

## 2018-04-21 NOTE — Progress Notes (Signed)
Kristin Good, [place] [address]   CLINIC:  Medical Oncology/Hematology  PCP:  Marinda Elk, [place] [address] [phone]   REASON FOR VISIT:  Follow-up for Peripheral T cell Lymphoma.  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[place])    01/28/2018 -  Chemotherapy    The patient had DOXOrubicin (ADRIAMYCIN) chemo injection 50 mg, 30 mg/m2 = 50 mg (60 % of original dose 50 mg/m2), Intravenous,  Once, 5 of 8 cycles Dose modification: 30 mg/m2 (60 % of original dose 50 mg/m2, Cycle 1, Reason: Other (see comments), Comment: elevated bilirubin) Administration: 50 mg (01/28/2018), 76 mg (03/11/2018), 76 mg (02/18/2018), 76 mg (04/01/2018), 76 mg (04/21/2018) palonosetron (ALOXI) injection 0.25 mg, 0.25 mg, Intravenous,  Once, 5 of 8 cycles Administration: 0.25 mg (01/28/2018), 0.25 mg (02/18/2018), 0.25 mg (03/11/2018), 0.25 mg (04/01/2018), 0.25 mg (04/21/2018) pegfilgrastim (NEULASTA ONPRO KIT) injection 6 mg, 6 mg, Subcutaneous, Once, 5 of 8 cycles Administration: 6 mg (02/20/2018), 6 mg (04/03/2018) vinCRIStine (ONCOVIN) 1 mg in sodium chloride 0.9 % 50 mL chemo infusion, 1 mg (50 % of original dose 2 mg), Intravenous,  Once, 5 of 8 cycles Dose modification: 1 mg (50 % of original dose 2 mg, Cycle 1, Reason: Other (see comments), Comment: liver dysfunctyion) Administration: 1 mg (01/28/2018), 2 mg (03/11/2018), 2 mg (02/18/2018), 2 mg (04/01/2018), 2 mg (04/21/2018) cyclophosphamide (CYTOXAN) 1,260 mg in sodium chloride 0.9 % 250 mL chemo infusion, 750 mg/m2 = 1,260 mg, Intravenous,  Once, 5 of 8 cycles Administration: 1,260 mg (01/28/2018), 1,140 mg (03/11/2018), 1,140 mg (02/18/2018), 1,140 mg (04/01/2018), 1,140 mg (04/21/2018) etoposide (VEPESID) 130 mg in sodium chloride 0.9 % 500 mL chemo  infusion, 75 mg/m2 = 130 mg (75 % of original dose 100 mg/m2), Intravenous,  Once, 5 of 8 cycles Dose modification: 75 mg/m2 (75 % of original dose 100 mg/m2, Cycle 1, Reason: Change in LFTs), 75 mg/m2 (75 % of original dose 100 mg/m2, Cycle 1, Reason: Change in LFTs) Administration: 130 mg (01/28/2018), 130 mg (01/29/2018), 130 mg (01/30/2018), 150 mg (02/19/2018), 150 mg (02/20/2018), 150 mg (03/12/2018), 150 mg (03/13/2018), 150 mg (04/02/2018), 150 mg (04/03/2018), 150 mg (03/11/2018), 150 mg (02/18/2018), 150 mg (04/01/2018), 150 mg (04/21/2018) fosaprepitant (EMEND) 150 mg, dexamethasone (DECADRON) 12 mg in sodium chloride 0.9 % 145 mL IVPB, , Intravenous,  Once, 5 of 6 cycles Administration:  (01/28/2018),  (02/18/2018),  (03/11/2018),  (04/01/2018),  (04/21/2018)  for chemotherapy treatment.       CANCER STAGING: Cancer Staging No matching staging information was found for the patient.   INTERVAL HISTORY:  Kristin Good 58 y.o. female returns for routine follow-up and consideration for next cycle of chemotherapy. She is here today alone. She states that she has been feeling ok since her last visit. She states that the current treatment has been going well. She states that she has numbness in her feet sometimes at night. Denies any nausea, vomiting, or diarrhea. Denies any new pains. Had not noticed any recent bleeding such as epistaxis, hematuria or hematochezia. Denies recent chest pain on exertion, shortness of breath on minimal exertion, pre-syncopal episodes, or palpitations. Denies any numbness or tingling in hands. Denies any recent fevers, infections, or recent hospitalizations. Patient reports appetite at 75% and energy level at 50%.  REVIEW OF SYSTEMS:  Review of Systems  All other systems reviewed and are negative.    PAST MEDICAL/SURGICAL HISTORY:  Past Medical History:  Diagnosis Date  . Diabetes ([place]) 01/20/2018  . HTN (hypertension)   . Hypothyroidism   . 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 COLONOSCOPY  07/27/2010   Procedure: COLONOSCOPY;  Surgeon: Dorothyann Peng, MD;  Location: [facility];  Service: Endoscopy;  Laterality: N/A;  . infertility surgery    . MOHS SURGERY     basal cell carcinoma  . PORTACATH PLACEMENT Left 04/01/2018   Procedure: INSERTION PORT-A-CATH;  Surgeon: Aviva Signs, MD;  Location: [facility] ORS;  Service: General;  Laterality: Left;  . REDUCTION MAMMAPLASTY Bilateral 2002     SOCIAL HISTORY:  Social History   Socioeconomic History  . Marital status: Married    Spouse name: Not on file  . Number of children: 0  . Years of education: Not on file  . Highest education level: Not on file  Occupational History  . Occupation: [employer]    Comment: Nurse  Social Needs  . Financial resource strain: Not on file  . Food insecurity:    Worry: Not on file    Inability: Not on file  . Transportation needs:    Medical: Not on file    Non-medical: Not on file  Tobacco Use  . Smoking status: Never Smoker  . Smokeless tobacco: Never Used  Substance and Sexual Activity  . Alcohol use: Yes    Comment: once or twice a year  . Drug use: No  . Sexual activity: Not on file  Lifestyle  . Physical activity:    Days per week: Not on file    Minutes per session: Not on file  . Stress: Not on file  Relationships  . Social connections:    Talks on phone: Not on file    Gets together: Not on file    Attends religious service: Not on file    Active member of club or organization: Not on file    Attends meetings of clubs or organizations: Not on file    Relationship status: Not on file  . Intimate partner violence:    Fear of current or ex partner: Not on file    Emotionally abused: Not on file    Physically abused: Not on file    Forced sexual activity: Not on file  Other Topics Concern  . Not on file  Social History Narrative  . Not on file    FAMILY HISTORY:  Family  History  Problem Relation Age of Onset  . Heart disease Mother   . Diabetes Father   . Breast cancer Sister 26  . Breast cancer Maternal Aunt   . Colon cancer Neg Hx   . Liver disease Neg Hx     CURRENT MEDICATIONS:  Outpatient Encounter Medications as of 04/21/2018  Medication Sig Note  . allopurinol (ZYLOPRIM) 300 MG tablet Take 1 tablet (300 mg total) by mouth daily.   Marland Kitchen atorvastatin (LIPITOR) 20 MG tablet Take 20 mg by mouth every morning.    . calcium-vitamin D (CALCIUM 500+D HIGH POTENCY) 500-400 MG-UNIT tablet Take 1 tablet by mouth 2 (two) times daily. 03/23/2018: On hold  . ciprofloxacin (CIPRO) 500 MG tablet Take 1 tablet (500 mg total) by mouth 2 (two) times daily. 02/02/2018: Prophylactic   . cyclophosphamide in sodium  chloride 0.9 % 250 mL Inject 1,260 mg into the vein every 21 ( twenty-one) days.   Marland Kitchen DOXOrubicin HCl (ADRIAMYCIN IV) Inject 84 mg into the vein every 21 ( twenty-one) days.   Marland Kitchen escitalopram (LEXAPRO) 10 MG tablet Take 1 tablet (10 mg total) by mouth daily.   . ETOPOSIDE IV Inject 170 mg into the vein every 21 ( twenty-one) days. Days 1-3   . fluconazole (DIFLUCAN) 100 MG tablet Take 1 tablet (100 mg total) by mouth daily.   . furosemide (LASIX) 20 MG tablet Take 1 tablet (20 mg total) by mouth daily as needed.   . Glucosamine-Chondroit-Vit C-Mn (GLUCOSAMINE 1500 COMPLEX PO) Take 1 capsule by mouth 2 (two) times daily. 03/23/2018: On hold  . HYDROcodone-acetaminophen (NORCO) 5-325 MG tablet Take 1 tablet by mouth every 6 (six) hours as needed for moderate pain.   Marland Kitchen levothyroxine (SYNTHROID, LEVOTHROID) 75 MCG tablet Take 75 mcg by mouth daily before breakfast.    . lidocaine-prilocaine (EMLA) cream Apply 1 application topically as needed.   . loperamide (IMODIUM) 2 MG capsule Take 2 mg by mouth as needed for diarrhea or loose stools.   . magic mouthwash SOLN Take 5 mLs by mouth 3 (three) times daily as needed for mouth pain. With lidocaine 1% 1:1 solution   . magnesium  oxide (MAG-OX) 400 (241.3 Mg) MG tablet Take 1 tablet (400 mg total) by mouth 2 (two) times daily. (Patient taking differently: Take 400 mg by mouth 3 (three) times daily. )   . metFORMIN (GLUCOPHAGE) 1000 MG tablet Take 1,000 mg by mouth 2 (two) times daily with a meal.    . pegfilgrastim (NEULASTA) 6 MG/0.6ML injection Inject 6 mg into the skin See admin instructions. To be administered 3 days after chemo therapy treatment   . potassium chloride SA (K-DUR,KLOR-CON) 20 MEQ tablet    . predniSONE (DELTASONE) 20 MG tablet TAKE 5 TABLETS BY MOUTH ONCE DAILY ON DAYS 1-5 OF CHEMOTHERAPY.   Marland Kitchen prochlorperazine (COMPAZINE) 10 MG tablet Take 1 tablet (10 mg total) by mouth every 6 (six) hours as needed for nausea or vomiting.   . triamterene-hydrochlorothiazide (MAXZIDE-25) 37.5-25 MG per tablet Take 1 tablet by mouth daily.   03/23/2018: On hold  . valACYclovir (VALTREX) 500 MG tablet Take 1 tablet (500 mg total) by mouth daily.   . vinCRIStine 2 mg in sodium chloride 0.9 % 50 mL Inject 2 mg into the vein every 21 ( twenty-one) days.   . vitamin B-12 (CYANOCOBALAMIN) 1000 MCG tablet Take 1 tablet (1,000 mcg total) by mouth daily.   . vitamin C (ASCORBIC ACID) 500 MG tablet Take 500 mg by mouth 2 (two) times daily. 03/23/2018: On hold   Facility-Administered Encounter Medications as of 04/21/2018  Medication  . Chlorhexidine Gluconate Cloth 2 % PADS 6 each   And  . Chlorhexidine Gluconate Cloth 2 % PADS 6 each  . sodium chloride flush (NS) 0.9 % injection 10 mL    ALLERGIES:  Allergies  Allergen Reactions  . Doxycycline Anaphylaxis  . Penicillins Anaphylaxis    Did it involve swelling of the face/tongue/throat, SOB, or low BP? Yes-SOB Did it involve sudden or severe rash/hives, skin peeling, or any reaction on the inside of your mouth or nose? Unknown Did you need to seek medical attention at a hospital or doctor's office? Yes When did it last happen?Over 10 years ago If all above answers are  "NO", may proceed with cephalosporin use.   . Levofloxacin Nausea And  Vomiting  . Sulfa Antibiotics Itching and Rash     PHYSICAL EXAM:  ECOG Performance status: 1 Blood pressure is 99/68 pulse is 106 respiratory rate is 16 temperature is 98.1. Physical Exam Vitals signs reviewed.  Constitutional:      Appearance: Normal appearance.  Cardiovascular:     Rate and Rhythm: Normal rate and regular rhythm.     Heart sounds: Normal heart sounds.  Pulmonary:     Effort: Pulmonary effort is normal.     Breath sounds: Normal breath sounds.  Abdominal:     General: Bowel sounds are normal. There is no distension.     Palpations: Abdomen is soft. There is no mass.  Musculoskeletal:        General: No swelling.  Skin:    General: Skin is warm.  Neurological:     General: No focal deficit present.     Mental Status: She is alert and oriented to person, place, and time.  Psychiatric:        Mood and Affect: Mood normal.        Behavior: Behavior normal.      LABORATORY DATA:  I have reviewed the labs as listed.  CBC    Component Value Date/Time   WBC 3.3 (L) 04/21/2018 0819   RBC 2.81 (L) 04/21/2018 0819   HGB 8.7 (L) 04/21/2018 0819   HCT 26.5 (L) 04/21/2018 0819   HCT 21.1 (L) 01/19/2018 0747   PLT 98 (L) 04/21/2018 0819   MCV 94.3 04/21/2018 0819   MCH 31.0 04/21/2018 0819   MCHC 32.8 04/21/2018 0819   RDW 19.8 (H) 04/21/2018 0819   LYMPHSABS 0.3 (L) 04/21/2018 0819   MONOABS 0.3 04/21/2018 0819   EOSABS 0.0 04/21/2018 0819   BASOSABS 0.0 04/21/2018 0819   CMP Latest Ref Rng & Units 04/21/2018 04/14/2018 04/07/2018  Glucose 70 - 99 mg/dL 221(H) 374(H) 413(H)  BUN 6 - 20 mg/dL 18 46(H) 52(H)  Creatinine 0.44 - 1.00 mg/dL 0.83 1.18(H) 0.94  Sodium 135 - 145 mmol/L 141 139 136  Potassium 3.5 - 5.1 mmol/L 4.1 3.3(L) 4.2  Chloride 98 - 111 mmol/L 107 105 98  CO2 22 - 32 mmol/L 22 20(L) 24  Calcium 8.9 - 10.3 mg/dL 8.8(L) 9.3 9.3  Total Protein 6.5 - 8.1 g/dL 5.7(L)  5.8(L) 5.8(L)  Total Bilirubin 0.3 - 1.2 mg/dL 0.7 0.3 0.9  Alkaline Phos 38 - 126 U/L 87 101 84  AST 15 - 41 U/L 14(L) 18 13(L)  ALT 0 - 44 U/L _0 DIAGNOSTIC IMAGING:  I have independently reviewed the scans and discussed with the patient.   I have reviewed Venita Lick LPN's note and agree with the documentation.  I personally performed a face-to-face visit, made revisions and my assessment and plan is as follows.    ASSESSMENT & PLAN:   Lymphoma, peripheral T-cell, spleen ([facility]) 1.  PTCL, NOS: -Presentation with pancytopenia, night sweats and fevers for last 1 to 2 months. - CT CAP shows splenomegaly with no lymphadenopathy. 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  T cells do not express TDT, CD1a, CD10, CD15, CD30, CD34, CD56, CD 117 or CD99.  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 - Prognostic index for PTCL ([facility]) has 2 positive factors including serum LDH above normal and bone marrow involvement. - She is not a  candidate for brentuximab-based regimen as CD30 was negative. -We talked about first-line therapy with CHOP-based regimen.  I have also talked to her about CHOEP regimen which has shown to be helpful for patients less than 60 and normal LDH. - We will obtain a baseline PET CT scan. - She has a PICC line placed for chemotherapy administration. -2D echo on 01/24/2018 shows ejection fraction of 65 to 70%. -PET/CT scan on 02/02/2018 shows splenomegaly with no hypermetabolism.  No enlarged or hypermetabolic adenopathy in the neck chest abdomen or pelvis. - 4 cycles of CHOEP from 01/28/2018 through 04/01/2018. -She met with Dr[facility] Minna Antis at [facility] for consolidation [facility]. -PET/CT scan on 04/13/2018 shows no FDG mass or adenopathy.  Decrease in size of the spleen measuring 13 cm, previously 16 cm.  Considerable heterogeneous uptake  throughout the axial and appendicular skeleton most notably in the thoracic and lumbar spine.  I think this is from G-CSF. -She had a bone marrow biopsy done on 04/16/2018.  I called and talked to Dr. Melina Copa and was informed that there was some fibrosis with mildly decreased cellularity however T-cell population still present. -I have called and talked to Dr. Minna Antis at [facility].  Plan is to proceed with cycle 5 today.  As she did not get complete response, allogenic transplant will be considered. -Patient felt very well after her last cycle.  Did not experience any major side effects. -Today she will proceed with her chemotherapy.  We will continue to monitor her labs weekly. - I will see her back in 3 weeks for follow-up.  I plan to follow-up on the bone marrow biopsy results.  2.  Hypomagnesemia: -We will continue magnesium 400 mg 3 times daily.         Orders placed this encounter:  No orders of the defined types were placed in this encounter.     Derek Jack, MD [place] [phone]

## 2018-04-21 NOTE — Progress Notes (Signed)
Labs reviewed with MD today at office visit. Will proceed with treatment today.   Treatment given per orders. Patient tolerated it well without problems. Vitals stable and discharged home from clinic ambulatory. Follow up as scheduled.

## 2018-04-22 ENCOUNTER — Ambulatory Visit (HOSPITAL_COMMUNITY): Payer: Commercial Managed Care - PPO

## 2018-04-22 ENCOUNTER — Encounter (HOSPITAL_COMMUNITY): Payer: Self-pay | Admitting: Hematology

## 2018-04-22 ENCOUNTER — Encounter (HOSPITAL_COMMUNITY): Payer: Commercial Managed Care - PPO | Admitting: Hematology

## 2018-04-22 ENCOUNTER — Inpatient Hospital Stay (HOSPITAL_COMMUNITY): Payer: Commercial Managed Care - PPO

## 2018-04-22 ENCOUNTER — Other Ambulatory Visit (HOSPITAL_COMMUNITY): Payer: Commercial Managed Care - PPO

## 2018-04-22 ENCOUNTER — Other Ambulatory Visit: Payer: Self-pay

## 2018-04-22 VITALS — BP 132/74 | HR 81 | Temp 97.5°F | Resp 18

## 2018-04-22 DIAGNOSIS — C8447 Peripheral T-cell lymphoma, not classified, spleen: Secondary | ICD-10-CM

## 2018-04-22 MED ORDER — SODIUM CHLORIDE 0.9 % IV SOLN
100.0000 mg/m2 | Freq: Once | INTRAVENOUS | Status: AC
  Administered 2018-04-22: 150 mg via INTRAVENOUS
  Filled 2018-04-22: qty 7.5

## 2018-04-22 MED ORDER — SODIUM CHLORIDE 0.9 % IV SOLN
INTRAVENOUS | Status: DC
  Administered 2018-04-22: 10:00:00 via INTRAVENOUS

## 2018-04-22 MED ORDER — SODIUM CHLORIDE 0.9 % IV SOLN
10.0000 mg | Freq: Once | INTRAVENOUS | Status: AC
  Administered 2018-04-22: 10 mg via INTRAVENOUS
  Filled 2018-04-22: qty 10

## 2018-04-22 MED ORDER — HEPARIN SOD (PORK) LOCK FLUSH 100 UNIT/ML IV SOLN
500.0000 [IU] | Freq: Once | INTRAVENOUS | Status: AC
  Administered 2018-04-22: 500 [IU] via INTRAVENOUS

## 2018-04-22 NOTE — Progress Notes (Signed)
Patient reported small amount of blood noted yesterday from her vagina when she wiped,small drop noted on her panties,  no other visible blood since then. Patient stated she did not see in blood in her urine. MD noted , will continue to monitor and proceed with treatment today.   Treatment given per orders. Patient tolerated it well without problems. Vitals stable and discharged home from clinic ambulatory. Follow up as scheduled.

## 2018-04-23 ENCOUNTER — Ambulatory Visit (HOSPITAL_COMMUNITY): Payer: Commercial Managed Care - PPO

## 2018-04-23 ENCOUNTER — Inpatient Hospital Stay (HOSPITAL_COMMUNITY): Payer: Commercial Managed Care - PPO

## 2018-04-23 ENCOUNTER — Encounter (HOSPITAL_COMMUNITY): Payer: Self-pay

## 2018-04-23 VITALS — BP 125/68 | HR 81 | Temp 97.9°F | Resp 18

## 2018-04-23 DIAGNOSIS — C8447 Peripheral T-cell lymphoma, not classified, spleen: Secondary | ICD-10-CM | POA: Diagnosis not present

## 2018-04-23 MED ORDER — SODIUM CHLORIDE 0.9 % IV SOLN
100.0000 mg/m2 | Freq: Once | INTRAVENOUS | Status: AC
  Administered 2018-04-23: 150 mg via INTRAVENOUS
  Filled 2018-04-23: qty 7.5

## 2018-04-23 MED ORDER — SODIUM CHLORIDE 0.9 % IV SOLN
10.0000 mg | Freq: Once | INTRAVENOUS | Status: AC
  Administered 2018-04-23: 10 mg via INTRAVENOUS
  Filled 2018-04-23: qty 10

## 2018-04-23 MED ORDER — PEGFILGRASTIM 6 MG/0.6ML ~~LOC~~ PSKT
6.0000 mg | PREFILLED_SYRINGE | Freq: Once | SUBCUTANEOUS | Status: AC
  Administered 2018-04-23: 6 mg via SUBCUTANEOUS

## 2018-04-23 MED ORDER — SODIUM CHLORIDE 0.9 % IV SOLN
INTRAVENOUS | Status: DC
  Administered 2018-04-23: 11:00:00 via INTRAVENOUS

## 2018-04-23 MED ORDER — SODIUM CHLORIDE 0.9% FLUSH
10.0000 mL | INTRAVENOUS | Status: DC | PRN
  Administered 2018-04-23: 10 mL via INTRAVENOUS
  Filled 2018-04-23: qty 10

## 2018-04-23 MED ORDER — HEPARIN SOD (PORK) LOCK FLUSH 100 UNIT/ML IV SOLN
500.0000 [IU] | Freq: Once | INTRAVENOUS | Status: AC
  Administered 2018-04-23: 14:00:00 500 [IU] via INTRAVENOUS

## 2018-04-23 NOTE — Progress Notes (Signed)
Kristin Good tolerated VP-16 infusion as well as Neulasta on-pro well without complaints or incident. Neulasta on-pro applied to pt's right arm with green indicator light flashing upon discharge. VSS upon discharge. Pt discharged self ambulatory in satisfactory condition

## 2018-04-23 NOTE — Patient Instructions (Signed)
[facility] Discharge Instructions for Patients Receiving Chemotherapy   Beginning January 23rd 2017 lab work for the Cancer Center will be done in the  Main lab at [facility] on 1st floor. If you have a lab appointment with the Cancer Center please come in thru the  Main Entrance and check in at the main information desk   Today you received the following chemotherapy agents VP-16 as well as Neulasta on-pro.Follow-up as scheduled. Call clinic for any questions or concerns  To help prevent nausea and vomiting after your treatment, we encourage you to take your nausea medication   If you develop nausea and vomiting, or diarrhea that is not controlled by your medication, call the clinic.  The clinic phone number is [phone]. Office hours are Monday-Friday 8:30am-5:00pm.  BELOW ARE SYMPTOMS THAT SHOULD BE REPORTED IMMEDIATELY:  *FEVER GREATER THAN 101.0 F  *CHILLS WITH OR WITHOUT FEVER  NAUSEA AND VOMITING THAT IS NOT CONTROLLED WITH YOUR NAUSEA MEDICATION  *UNUSUAL SHORTNESS OF BREATH  *UNUSUAL BRUISING OR BLEEDING  TENDERNESS IN MOUTH AND THROAT WITH OR WITHOUT PRESENCE OF ULCERS  *URINARY PROBLEMS  *BOWEL PROBLEMS  UNUSUAL RASH Items with * indicate a potential emergency and should be followed up as soon as possible. If you have an emergency after office hours please contact your primary care physician or go to the nearest emergency department.  Please call the clinic during office hours if you have any questions or concerns.   You may also contact the Patient Navigator at [phone] should you have any questions or need assistance in obtaining follow up care.      Resources For Cancer Patients and their Caregivers ? American Cancer Society: Can assist with transportation, wigs, general needs, runs Look Good Feel Better.        1-888-227-6333 ? Cancer Care: Provides financial assistance, online support groups, medication/co-pay assistance.   1-800-813-HOPE (4673) ? [facility] Assists [place] cancer patients and their families through emotional , educational and financial support.  336-427-4357 ? [place] DSS Where to apply for food stamps, Medicaid and utility assistance. 336-342-1394 ? [facility]: Transportation to medical appointments. 336-347-2287 ? Social Security Administration: May apply for disability if have a Stage IV cancer. 336-342-7796 1-800-772-1213 ? [place] Aging, Disability and Transit Services: Assists with nutrition, care and transit needs. 336-349-2343         

## 2018-04-27 ENCOUNTER — Other Ambulatory Visit: Payer: Self-pay

## 2018-04-27 ENCOUNTER — Other Ambulatory Visit (HOSPITAL_COMMUNITY): Payer: Commercial Managed Care - PPO

## 2018-04-27 ENCOUNTER — Inpatient Hospital Stay (HOSPITAL_COMMUNITY): Payer: Commercial Managed Care - PPO

## 2018-04-27 DIAGNOSIS — C8447 Peripheral T-cell lymphoma, not classified, spleen: Secondary | ICD-10-CM

## 2018-04-27 LAB — COMPREHENSIVE METABOLIC PANEL
ALT: 15 U/L (ref 0–44)
AST: 18 U/L (ref 15–41)
Albumin: 3.9 g/dL (ref 3.5–5.0)
Alkaline Phosphatase: 77 U/L (ref 38–126)
Anion gap: 12 (ref 5–15)
BUN: 33 mg/dL — ABNORMAL HIGH (ref 6–20)
CO2: 22 mmol/L (ref 22–32)
Calcium: 9.4 mg/dL (ref 8.9–10.3)
Chloride: 105 mmol/L (ref 98–111)
Creatinine, Ser: 0.79 mg/dL (ref 0.44–1.00)
GFR calc Af Amer: >60 mL/min (ref >60)
GFR calc non Af Amer: >60 mL/min (ref >60)
Glucose, Bld: 219 mg/dL — ABNORMAL HIGH (ref 70–99)
Potassium: 4.5 mmol/L (ref 3.5–5.1)
Sodium: 139 mmol/L (ref 135–145)
Total Bilirubin: 1.4 mg/dL — ABNORMAL HIGH (ref 0.3–1.2)
Total Protein: 5.9 g/dL — ABNORMAL LOW (ref 6.5–8.1)

## 2018-04-27 LAB — CBC WITH DIFFERENTIAL/PLATELET
Abs Immature Granulocytes: 0.14 10*3/uL — ABNORMAL HIGH (ref 0.00–0.07)
Basophils Absolute: 0 10*3/uL (ref 0.0–0.1)
Basophils Relative: 1 %
Eosinophils Absolute: 0 10*3/uL (ref 0.0–0.5)
Eosinophils Relative: 0 %
HCT: 28 % — ABNORMAL LOW (ref 36.0–46.0)
Hemoglobin: 9.2 g/dL — ABNORMAL LOW (ref 12.0–15.0)
Immature Granulocytes: 14 %
Lymphocytes Relative: 26 %
Lymphs Abs: 0.3 10*3/uL — ABNORMAL LOW (ref 0.7–4.0)
MCH: 31.2 pg (ref 26.0–34.0)
MCHC: 32.9 g/dL (ref 30.0–36.0)
MCV: 94.9 fL (ref 80.0–100.0)
Monocytes Absolute: 0 10*3/uL — ABNORMAL LOW (ref 0.1–1.0)
Monocytes Relative: 2 %
Neutro Abs: 0.6 10*3/uL — ABNORMAL LOW (ref 1.7–7.7)
Neutrophils Relative %: 57 %
Platelets: 82 10*3/uL — ABNORMAL LOW (ref 150–400)
RBC: 2.95 MIL/uL — ABNORMAL LOW (ref 3.87–5.11)
RDW: 18.3 % — ABNORMAL HIGH (ref 11.5–15.5)
WBC: 1 10*3/uL — CL (ref 4.0–10.5)
nRBC: 0 % (ref 0.0–0.2)

## 2018-04-28 NOTE — Progress Notes (Unsigned)
CRITICAL VALUE ALERT  Critical Value:  WBC 1.0  Date & Time Notied:  04/27/2018 at 1038  Provider Notified: Dr. Delton Coombes  Orders Received/Actions taken: n/a

## 2018-04-30 ENCOUNTER — Other Ambulatory Visit (HOSPITAL_COMMUNITY): Payer: Self-pay | Admitting: Hematology

## 2018-05-01 ENCOUNTER — Other Ambulatory Visit (HOSPITAL_COMMUNITY): Payer: Self-pay | Admitting: *Deleted

## 2018-05-01 DIAGNOSIS — C8447 Peripheral T-cell lymphoma, not classified, spleen: Secondary | ICD-10-CM

## 2018-05-01 MED ORDER — PREDNISONE 20 MG PO TABS: ORAL_TABLET | ORAL | 0 refills | Status: AC

## 2018-05-04 ENCOUNTER — Inpatient Hospital Stay (HOSPITAL_COMMUNITY): Payer: Commercial Managed Care - PPO

## 2018-05-04 ENCOUNTER — Other Ambulatory Visit: Payer: Self-pay

## 2018-05-04 ENCOUNTER — Encounter (HOSPITAL_COMMUNITY): Payer: Self-pay

## 2018-05-04 DIAGNOSIS — C8447 Peripheral T-cell lymphoma, not classified, spleen: Secondary | ICD-10-CM

## 2018-05-04 LAB — COMPREHENSIVE METABOLIC PANEL
ALT: 20 U/L (ref 0–44)
AST: 17 U/L (ref 15–41)
Albumin: 3.4 g/dL — ABNORMAL LOW (ref 3.5–5.0)
Alkaline Phosphatase: 88 U/L (ref 38–126)
Anion gap: 12 (ref 5–15)
BUN: 26 mg/dL — ABNORMAL HIGH (ref 6–20)
CO2: 18 mmol/L — ABNORMAL LOW (ref 22–32)
Calcium: 8.9 mg/dL (ref 8.9–10.3)
Chloride: 111 mmol/L (ref 98–111)
Creatinine, Ser: 0.91 mg/dL (ref 0.44–1.00)
GFR calc Af Amer: >60 mL/min (ref >60)
GFR calc non Af Amer: >60 mL/min (ref >60)
Glucose, Bld: 294 mg/dL — ABNORMAL HIGH (ref 70–99)
Potassium: 3.6 mmol/L (ref 3.5–5.1)
Sodium: 141 mmol/L (ref 135–145)
Total Bilirubin: 0.2 mg/dL — ABNORMAL LOW (ref 0.3–1.2)
Total Protein: 5.7 g/dL — ABNORMAL LOW (ref 6.5–8.1)

## 2018-05-04 LAB — CBC WITH DIFFERENTIAL/PLATELET
Abs Immature Granulocytes: 0.12 10*3/uL — ABNORMAL HIGH (ref 0.00–0.07)
Band Neutrophils: 7 %
Basophils Absolute: 0 10*3/uL (ref 0.0–0.1)
Basophils Relative: 1 %
Eosinophils Absolute: 0 10*3/uL (ref 0.0–0.5)
Eosinophils Relative: 1 %
HCT: 20.5 % — ABNORMAL LOW (ref 36.0–46.0)
Hemoglobin: 6.6 g/dL — CL (ref 12.0–15.0)
Immature Granulocytes: 7 %
Lymphocytes Relative: 13 %
Lymphs Abs: 0.2 10*3/uL — ABNORMAL LOW (ref 0.7–4.0)
MCH: 31.1 pg (ref 26.0–34.0)
MCHC: 32.2 g/dL (ref 30.0–36.0)
MCV: 96.7 fL (ref 80.0–100.0)
Monocytes Absolute: 0.3 10*3/uL (ref 0.1–1.0)
Monocytes Relative: 18 %
Neutro Abs: 1.1 10*3/uL — ABNORMAL LOW (ref 1.7–7.7)
Neutrophils Relative %: 60 %
Platelets: 26 10*3/uL — CL (ref 150–400)
RBC: 2.12 MIL/uL — ABNORMAL LOW (ref 3.87–5.11)
RDW: 18.6 % — ABNORMAL HIGH (ref 11.5–15.5)
WBC: 1.8 10*3/uL — ABNORMAL LOW (ref 4.0–10.5)
nRBC: 1.1 % — ABNORMAL HIGH (ref 0.0–0.2)

## 2018-05-04 LAB — SAMPLE TO BLOOD BANK

## 2018-05-04 LAB — PREPARE RBC (CROSSMATCH)

## 2018-05-04 MED ORDER — DIPHENHYDRAMINE HCL 25 MG PO TABS
25.0000 mg | ORAL_TABLET | Freq: Once | ORAL | Status: AC
  Administered 2018-05-04: 11:00:00 25 mg via ORAL
  Filled 2018-05-04: qty 1

## 2018-05-04 MED ORDER — SODIUM CHLORIDE 0.9% IV SOLUTION
250.0000 mL | Freq: Once | INTRAVENOUS | Status: AC
  Administered 2018-05-04: 13:00:00 250 mL via INTRAVENOUS

## 2018-05-04 MED ORDER — DIPHENHYDRAMINE HCL 25 MG PO CAPS
ORAL_CAPSULE | ORAL | Status: AC
  Filled 2018-05-04: qty 1

## 2018-05-04 MED ORDER — SODIUM CHLORIDE 0.9% FLUSH
10.0000 mL | INTRAVENOUS | Status: AC | PRN
  Administered 2018-05-04: 15:00:00 10 mL

## 2018-05-04 MED ORDER — ACETAMINOPHEN 325 MG PO TABS
650.0000 mg | ORAL_TABLET | Freq: Once | ORAL | Status: AC
  Administered 2018-05-04: 11:00:00 650 mg via ORAL
  Filled 2018-05-04: qty 2

## 2018-05-04 MED ORDER — DIPHENHYDRAMINE HCL 50 MG/ML IJ SOLN: 25.0000 mg | Freq: Once | INTRAMUSCULAR | Status: AC

## 2018-05-04 MED ORDER — HEPARIN SOD (PORK) LOCK FLUSH 100 UNIT/ML IV SOLN
500.0000 [IU] | Freq: Every day | INTRAVENOUS | Status: AC | PRN
  Administered 2018-05-04: 500 [IU]

## 2018-05-04 NOTE — Patient Instructions (Signed)
[facility] at [facility]  Discharge Instructions:   _______________________________________________________________  Thank you for choosing [facility] at [facility] to provide your oncology and hematology care.  To afford each patient quality time with our providers, please arrive at least 15 minutes before your scheduled appointment.  You need to re-schedule your appointment if you arrive 10 or more minutes late.  We strive to give you quality time with our providers, and arriving late affects you and other patients whose appointments are after yours.  Also, if you no show three or more times for appointments you may be dismissed from the clinic.  Again, thank you for choosing [facility] at [facility]. Our hope is that these requests will allow you access to exceptional care and in a timely manner. _______________________________________________________________  If you have questions after your visit, please contact our office at [phone] between the hours of 8:30 a.m. and 5:00 p.m. Voicemails left after 4:30 p.m. will not be returned until the following business day. _______________________________________________________________  For prescription refill requests, have your pharmacy contact our office. _______________________________________________________________  Recommendations made by the consultant and any test results will be sent to your referring physician. _______________________________________________________________ 

## 2018-05-04 NOTE — Progress Notes (Signed)
CRITICAL VALUE ALERT  Critical Value:  Hgb 6.6; platelets 26  Date & Time Notied:  05/04/2018 at [place]  Provider Notified: Dr. Delton Coombes  Orders Received/Actions taken: transfuse 1 unit PRBC

## 2018-05-04 NOTE — Progress Notes (Signed)
One unit of blood given today per orders.   Treatment given per orders. Patient tolerated it well without problems. Vitals stable and discharged home from clinic ambulatory. Follow up as scheduled.  

## 2018-05-05 LAB — BPAM RBC
Blood Product Expiration Date: 202005142359
ISSUE DATE / TIME: 202004201259
Unit Type and Rh: 5100

## 2018-05-05 LAB — TYPE AND SCREEN
ABO/RH(D): B POS
Antibody Screen: NEGATIVE
Unit division: 0

## 2018-05-11 ENCOUNTER — Inpatient Hospital Stay (HOSPITAL_COMMUNITY): Payer: Commercial Managed Care - PPO

## 2018-05-11 ENCOUNTER — Other Ambulatory Visit: Payer: Self-pay

## 2018-05-11 DIAGNOSIS — C8447 Peripheral T-cell lymphoma, not classified, spleen: Secondary | ICD-10-CM

## 2018-05-11 LAB — COMPREHENSIVE METABOLIC PANEL
ALT: 14 U/L (ref 0–44)
AST: 17 U/L (ref 15–41)
Albumin: 3.9 g/dL (ref 3.5–5.0)
Alkaline Phosphatase: 80 U/L (ref 38–126)
Anion gap: 13 (ref 5–15)
BUN: 22 mg/dL — ABNORMAL HIGH (ref 6–20)
CO2: 22 mmol/L (ref 22–32)
Calcium: 9.6 mg/dL (ref 8.9–10.3)
Chloride: 106 mmol/L (ref 98–111)
Creatinine, Ser: 0.84 mg/dL (ref 0.44–1.00)
GFR calc Af Amer: >60 mL/min (ref >60)
GFR calc non Af Amer: >60 mL/min (ref >60)
Glucose, Bld: 213 mg/dL — ABNORMAL HIGH (ref 70–99)
Potassium: 4.6 mmol/L (ref 3.5–5.1)
Sodium: 141 mmol/L (ref 135–145)
Total Bilirubin: 0.8 mg/dL (ref 0.3–1.2)
Total Protein: 6 g/dL — ABNORMAL LOW (ref 6.5–8.1)

## 2018-05-11 LAB — CBC WITH DIFFERENTIAL/PLATELET
Abs Immature Granulocytes: 0.16 10*3/uL — ABNORMAL HIGH (ref 0.00–0.07)
Basophils Absolute: 0 10*3/uL (ref 0.0–0.1)
Basophils Relative: 1 %
Eosinophils Absolute: 0 10*3/uL (ref 0.0–0.5)
Eosinophils Relative: 1 %
HCT: 28.6 % — ABNORMAL LOW (ref 36.0–46.0)
Hemoglobin: 9 g/dL — ABNORMAL LOW (ref 12.0–15.0)
Immature Granulocytes: 4 %
Lymphocytes Relative: 9 %
Lymphs Abs: 0.4 10*3/uL — ABNORMAL LOW (ref 0.7–4.0)
MCH: 32.7 pg (ref 26.0–34.0)
MCHC: 31.5 g/dL (ref 30.0–36.0)
MCV: 104 fL — ABNORMAL HIGH (ref 80.0–100.0)
Monocytes Absolute: 0.4 10*3/uL (ref 0.1–1.0)
Monocytes Relative: 10 %
Neutro Abs: 3.3 10*3/uL (ref 1.7–7.7)
Neutrophils Relative %: 75 %
Platelets: 92 10*3/uL — ABNORMAL LOW (ref 150–400)
RBC: 2.75 MIL/uL — ABNORMAL LOW (ref 3.87–5.11)
RDW: 22.3 % — ABNORMAL HIGH (ref 11.5–15.5)
WBC: 4.3 10*3/uL (ref 4.0–10.5)
nRBC: 0 % (ref 0.0–0.2)

## 2018-05-12 ENCOUNTER — Other Ambulatory Visit: Payer: Self-pay

## 2018-05-13 ENCOUNTER — Inpatient Hospital Stay (HOSPITAL_COMMUNITY): Payer: Commercial Managed Care - PPO

## 2018-05-13 ENCOUNTER — Inpatient Hospital Stay (HOSPITAL_BASED_OUTPATIENT_CLINIC_OR_DEPARTMENT_OTHER): Payer: Commercial Managed Care - PPO | Admitting: Hematology

## 2018-05-13 ENCOUNTER — Encounter (HOSPITAL_COMMUNITY): Payer: Self-pay

## 2018-05-13 ENCOUNTER — Encounter (HOSPITAL_COMMUNITY): Payer: Self-pay | Admitting: Hematology

## 2018-05-13 DIAGNOSIS — Z79899 Other long term (current) drug therapy: Secondary | ICD-10-CM

## 2018-05-13 DIAGNOSIS — C8447 Peripheral T-cell lymphoma, not classified, spleen: Secondary | ICD-10-CM

## 2018-05-13 DIAGNOSIS — R161 Splenomegaly, not elsewhere classified: Secondary | ICD-10-CM | POA: Diagnosis not present

## 2018-05-13 DIAGNOSIS — D61818 Other pancytopenia: Secondary | ICD-10-CM

## 2018-05-13 LAB — CBC WITH DIFFERENTIAL/PLATELET
Abs Immature Granulocytes: 0.05 10*3/uL (ref 0.00–0.07)
Basophils Absolute: 0 10*3/uL (ref 0.0–0.1)
Basophils Relative: 1 %
Eosinophils Absolute: 0 10*3/uL (ref 0.0–0.5)
Eosinophils Relative: 1 %
HCT: 25 % — ABNORMAL LOW (ref 36.0–46.0)
Hemoglobin: 8.1 g/dL — ABNORMAL LOW (ref 12.0–15.0)
Immature Granulocytes: 2 %
Lymphocytes Relative: 7 %
Lymphs Abs: 0.2 10*3/uL — ABNORMAL LOW (ref 0.7–4.0)
MCH: 33.6 pg (ref 26.0–34.0)
MCHC: 32.4 g/dL (ref 30.0–36.0)
MCV: 103.7 fL — ABNORMAL HIGH (ref 80.0–100.0)
Monocytes Absolute: 0.2 10*3/uL (ref 0.1–1.0)
Monocytes Relative: 5 %
Neutro Abs: 2.6 10*3/uL (ref 1.7–7.7)
Neutrophils Relative %: 84 %
Platelets: 89 10*3/uL — ABNORMAL LOW (ref 150–400)
RBC: 2.41 MIL/uL — ABNORMAL LOW (ref 3.87–5.11)
RDW: 22.5 % — ABNORMAL HIGH (ref 11.5–15.5)
WBC: 3.1 10*3/uL — ABNORMAL LOW (ref 4.0–10.5)
nRBC: 0 % (ref 0.0–0.2)

## 2018-05-13 LAB — COMPREHENSIVE METABOLIC PANEL WITH GFR
ALT: 15 U/L (ref 0–44)
AST: 18 U/L (ref 15–41)
Albumin: 3.6 g/dL (ref 3.5–5.0)
Alkaline Phosphatase: 80 U/L (ref 38–126)
Anion gap: 10 (ref 5–15)
BUN: 24 mg/dL — ABNORMAL HIGH (ref 6–20)
CO2: 21 mmol/L — ABNORMAL LOW (ref 22–32)
Calcium: 9.2 mg/dL (ref 8.9–10.3)
Chloride: 109 mmol/L (ref 98–111)
Creatinine, Ser: 0.78 mg/dL (ref 0.44–1.00)
GFR calc Af Amer: >60 mL/min
GFR calc non Af Amer: >60 mL/min
Glucose, Bld: 242 mg/dL — ABNORMAL HIGH (ref 70–99)
Potassium: 4.5 mmol/L (ref 3.5–5.1)
Sodium: 140 mmol/L (ref 135–145)
Total Bilirubin: 0.5 mg/dL (ref 0.3–1.2)
Total Protein: 5.9 g/dL — ABNORMAL LOW (ref 6.5–8.1)

## 2018-05-13 LAB — SAMPLE TO BLOOD BANK

## 2018-05-13 LAB — MAGNESIUM: Magnesium: 1.8 mg/dL (ref 1.7–2.4)

## 2018-05-13 MED ORDER — ZOLPIDEM TARTRATE 5 MG PO TABS: 5.0000 mg | ORAL_TABLET | Freq: Every evening | ORAL | 0 refills | Status: DC | PRN

## 2018-05-13 MED ORDER — HEPARIN SOD (PORK) LOCK FLUSH 100 UNIT/ML IV SOLN
500.0000 [IU] | Freq: Once | INTRAVENOUS | Status: AC
  Administered 2018-05-13: 500 [IU] via INTRAVENOUS

## 2018-05-13 MED ORDER — SODIUM CHLORIDE 0.9% FLUSH
10.0000 mL | Freq: Once | INTRAVENOUS | Status: AC
  Administered 2018-05-13: 10 mL via INTRAVENOUS

## 2018-05-13 NOTE — Assessment & Plan Note (Signed)
1.  PTCL, NOS: -Presentation with pancytopenia, night sweats and fevers for last 1 to 2 months. - CT CAP shows splenomegaly with no lymphadenopathy. 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  T cells do not express TDT, CD1a, CD10, CD15, CD30, CD34, CD56, CD 117 or CD99.  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 - Prognostic index for PTCL (PIT) has 2 positive factors including serum LDH above normal and bone marrow involvement. - She is not a candidate for brentuximab-based regimen as CD30 was negative. -We talked about first-line therapy with CHOP-based regimen.  I have also talked to her about CHOEP regimen which has shown to be helpful for patients less than 60 and normal LDH. - We will obtain a baseline PET CT scan. - She has a PICC line placed for chemotherapy administration. -2D echo on 01/24/2018 shows ejection fraction of 65 to 70%. -PET/CT scan on 02/02/2018 shows splenomegaly with no hypermetabolism.  No enlarged or hypermetabolic adenopathy in the neck chest abdomen or pelvis. - 4 cycles of CHOEP from 01/28/2018 through 04/01/2018. -She met with Dr. Horwitz at [facility] for consolidation transplant. -PET/CT scan on 04/13/2018 shows no FDG mass or adenopathy.  Decrease in size of the spleen measuring 13 cm, previously 16 cm.  Considerable heterogeneous uptake throughout the axial and appendicular skeleton most notably in the thoracic and lumbar spine.  I think this is from G-CSF. -Bone marrow biopsy on 04/16/2018 shows atypical T-cell proliferation, persistent.  The extensive nature of T-cell infiltrate is concerning for a neoplastic T-cell lymphoproliferative process.  T-cell receptor PCR is polyclonal.  Additional analysis for expression of BCL 6, CXCL13 and PD1 were sent. -She was evaluated by Dr. Horwitz at [facility] again.  She was felt to have poor functional status for  transplant.  They have recommended about a month after her last cycle for her to regain her performance status. -I have also called and talked to Dr. Horwitz about pathology report.  As there is improvement in her blood counts and decreasing spleen volume, it is highly unlikely that it is a malignant process.  Hence she was recommended to proceed with her final cycle.  She will come back next week for her cycle 6.  2.  Hypomagnesemia: -Magnesium is 1.8.  We will continue magnesium 400 mg 3 times a day.     

## 2018-05-13 NOTE — Patient Instructions (Signed)
[facility] at [facility] Discharge Instructions  You were seen today by Dr. Katragadda. He went over your recent lab results. He will see you back in  for labs and follow up.   Thank you for choosing [facility] at [facility] to provide your oncology and hematology care.  To afford each patient quality time with our provider, please arrive at least 15 minutes before your scheduled appointment time.   If you have a lab appointment with the Cancer Center please come in thru the  Main Entrance and check in at the main information desk  You need to re-schedule your appointment should you arrive 10 or more minutes late.  We strive to give you quality time with our providers, and arriving late affects you and other patients whose appointments are after yours.  Also, if you no show three or more times for appointments you may be dismissed from the clinic at the providers discretion.     Again, thank you for choosing [facility].  Our hope is that these requests will decrease the amount of time that you wait before being seen by our physicians.       _____________________________________________________________  Should you have questions after your visit to [facility], please contact our office at [phone] between the hours of 8:00 a.m. and 4:30 p.m.  Voicemails left after 4:00 p.m. will not be returned until the following business day.  For prescription refill requests, have your pharmacy contact our office and allow 72 hours.    Cancer Center Support Programs:   > Cancer Support Group  2nd Tuesday of the month 1pm-2pm, Journey Room    

## 2018-05-13 NOTE — Progress Notes (Signed)
[address] [place], [place] [address]   CLINIC:  Medical Oncology/Hematology  PCP:  Marinda Elk, [place] [address] [phone]   REASON FOR VISIT:  Follow-up for Peripheral T cell Lymphoma.  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[place])    01/28/2018 -  Chemotherapy    The patient had DOXOrubicin (ADRIAMYCIN) chemo injection 50 mg, 30 mg/m2 = 50 mg (60 % of original dose 50 mg/m2), Intravenous,  Once, 5 of 8 cycles Dose modification: 30 mg/m2 (60 % of original dose 50 mg/m2, Cycle 1, Reason: Other (see comments), Comment: elevated bilirubin) Administration: 50 mg (01/28/2018), 76 mg (03/11/2018), 76 mg (02/18/2018), 76 mg (04/01/2018), 76 mg (04/21/2018) palonosetron (ALOXI) injection 0.25 mg, 0.25 mg, Intravenous,  Once, 5 of 8 cycles Administration: 0.25 mg (01/28/2018), 0.25 mg (02/18/2018), 0.25 mg (03/11/2018), 0.25 mg (04/01/2018), 0.25 mg (04/21/2018) pegfilgrastim (NEULASTA ONPRO KIT) injection 6 mg, 6 mg, Subcutaneous, Once, 5 of 8 cycles Administration: 6 mg (02/20/2018), 6 mg (04/03/2018), 6 mg (04/23/2018) vinCRIStine (ONCOVIN) 1 mg in sodium chloride 0.9 % 50 mL chemo infusion, 1 mg (50 % of original dose 2 mg), Intravenous,  Once, 5 of 8 cycles Dose modification: 1 mg (50 % of original dose 2 mg, Cycle 1, Reason: Other (see comments), Comment: liver dysfunctyion) Administration: 1 mg (01/28/2018), 2 mg (03/11/2018), 2 mg (02/18/2018), 2 mg (04/01/2018), 2 mg (04/21/2018) cyclophosphamide (CYTOXAN) 1,260 mg in sodium chloride 0.9 % 250 mL chemo infusion, 750 mg/m2 = 1,260 mg, Intravenous,  Once, 5 of 8 cycles Administration: 1,260 mg (01/28/2018), 1,140 mg (03/11/2018), 1,140 mg (02/18/2018), 1,140 mg (04/01/2018), 1,140 mg (04/21/2018) etoposide (VEPESID) 130 mg in sodium chloride  0.9 % 500 mL chemo infusion, 75 mg/m2 = 130 mg (75 % of original dose 100 mg/m2), Intravenous,  Once, 5 of 8 cycles Dose modification: 75 mg/m2 (75 % of original dose 100 mg/m2, Cycle 1, Reason: Change in LFTs), 75 mg/m2 (75 % of original dose 100 mg/m2, Cycle 1, Reason: Change in LFTs) Administration: 130 mg (01/28/2018), 130 mg (01/29/2018), 130 mg (01/30/2018), 150 mg (02/19/2018), 150 mg (02/20/2018), 150 mg (03/12/2018), 150 mg (03/13/2018), 150 mg (04/02/2018), 150 mg (04/03/2018), 150 mg (04/22/2018), 150 mg (04/23/2018), 150 mg (03/11/2018), 150 mg (02/18/2018), 150 mg (04/01/2018), 150 mg (04/21/2018) fosaprepitant (EMEND) 150 mg, dexamethasone (DECADRON) 12 mg in sodium chloride 0.9 % 145 mL IVPB, , Intravenous,  Once, 5 of 6 cycles Administration:  (01/28/2018),  (02/18/2018),  (03/11/2018),  (04/01/2018),  (04/21/2018)  for chemotherapy treatment.       CANCER STAGING: Cancer Staging No matching staging information was found for the patient.   INTERVAL HISTORY:  Ms. Barno 58 y.o. female returns for routine follow-up and consideration for next cycle of chemotherapy. She is here today alone. She states that she has recently seen her transplant doctor and she is working on things that he recommended before transplant. Denies any nausea, vomiting, or diarrhea. Denies any new pains. Had not noticed any recent bleeding such as epistaxis, hematuria or hematochezia. Denies recent chest pain on exertion, shortness of breath on minimal exertion, pre-syncopal episodes, or palpitations. Denies any numbness or tingling in hands or feet. Denies any recent fevers, infections, or recent hospitalizations. Patient reports appetite at 100% and energy level at 75%.  REVIEW OF SYSTEMS:  Review of Systems  All other systems reviewed and are negative.    PAST MEDICAL/SURGICAL HISTORY:  Past Medical History:  Diagnosis Date  . Diabetes ([place]) 01/20/2018  . HTN (hypertension)   . Hypothyroidism   . 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 COLONOSCOPY  07/27/2010   Procedure: COLONOSCOPY;  Surgeon: Dorothyann Peng, MD;  Location: [facility] ENDO SUITE;  Service: Endoscopy;  Laterality: N/A;  . infertility surgery    . MOHS SURGERY     basal cell carcinoma  . PORTACATH PLACEMENT Left 04/01/2018   Procedure: INSERTION PORT-A-CATH;  Surgeon: Aviva Signs, MD;  Location: [facility] ORS;  Service: General;  Laterality: Left;  . REDUCTION MAMMAPLASTY Bilateral 2002     SOCIAL HISTORY:  Social History   Socioeconomic History  . Marital status: Married    Spouse name: Not on file  . Number of children: 0  . Years of education: Not on file  . Highest education level: Not on file  Occupational History  . Occupation: [employer]    Comment: Nurse  Social Needs  . Financial resource strain: Not on file  . Food insecurity:    Worry: Not on file    Inability: Not on file  . Transportation needs:    Medical: Not on file    Non-medical: Not on file  Tobacco Use  . Smoking status: Never Smoker  . Smokeless tobacco: Never Used  Substance and Sexual Activity  . Alcohol use: Yes    Comment: once or twice a year  . Drug use: No  . Sexual activity: Not on file  Lifestyle  . Physical activity:    Days per week: Not on file    Minutes per session: Not on file  . Stress: Not on file  Relationships  . Social connections:    Talks on phone: Not on file    Gets together: Not on file    Attends religious service: Not on file    Active member of club or organization: Not on file    Attends meetings of clubs or organizations: Not on file    Relationship status: Not on file  . Intimate partner violence:    Fear of current or ex partner: Not on file    Emotionally abused: Not on file    Physically abused: Not on file    Forced sexual activity: Not on file  Other Topics Concern  . Not on file  Social History Narrative  . Not on file    FAMILY  HISTORY:  Family History  Problem Relation Age of Onset  . Heart disease Mother   . Diabetes Father   . Breast cancer Sister 72  . Breast cancer Maternal Aunt   . Colon cancer Neg Hx   . Liver disease Neg Hx     CURRENT MEDICATIONS:  Outpatient Encounter Medications as of 05/13/2018  Medication Sig Note  . allopurinol (ZYLOPRIM) 300 MG tablet Take 1 tablet (300 mg total) by mouth daily.   Marland Kitchen atorvastatin (LIPITOR) 20 MG tablet Take 20 mg by mouth every morning.    . calcium-vitamin D (CALCIUM 500+D HIGH POTENCY) 500-400 MG-UNIT tablet Take 1 tablet by mouth 2 (two) times daily. 03/23/2018: On hold  . ciprofloxacin (CIPRO) 500 MG tablet Take 1 tablet (500 mg total) by mouth 2 (two) times daily. 02/02/2018: Prophylactic   . cyclophosphamide in sodium  chloride 0.9 % 250 mL Inject 1,260 mg into the vein every 21 ( twenty-one) days.   Marland Kitchen DOXOrubicin HCl (ADRIAMYCIN IV) Inject 84 mg into the vein every 21 ( twenty-one) days.   . ETOPOSIDE IV Inject 170 mg into the vein every 21 ( twenty-one) days. Days 1-3   . fluconazole (DIFLUCAN) 100 MG tablet TAKE 1 TABLET BY MOUTH ONCE DAILY.   . furosemide (LASIX) 20 MG tablet Take 1 tablet (20 mg total) by mouth daily as needed.   . Glucosamine-Chondroit-Vit C-Mn (GLUCOSAMINE 1500 COMPLEX PO) Take 1 capsule by mouth 2 (two) times daily. 03/23/2018: On hold  . levothyroxine (SYNTHROID, LEVOTHROID) 75 MCG tablet Take 75 mcg by mouth daily before breakfast.    . lidocaine-prilocaine (EMLA) cream Apply 1 application topically as needed. (Patient not taking: Reported on 05/13/2018)   . loperamide (IMODIUM) 2 MG capsule Take 2 mg by mouth as needed for diarrhea or loose stools.   . magic mouthwash SOLN Take 5 mLs by mouth 3 (three) times daily as needed for mouth pain. With lidocaine 1% 1:1 solution (Patient not taking: Reported on 05/13/2018)   . magnesium oxide (MAG-OX) 400 (241.3 Mg) MG tablet Take 1 tablet (400 mg total) by mouth 2 (two) times daily. (Patient taking  differently: Take 400 mg by mouth 3 (three) times daily. )   . metFORMIN (GLUCOPHAGE) 1000 MG tablet Take 1,000 mg by mouth 2 (two) times daily with a meal.    . pegfilgrastim (NEULASTA) 6 MG/0.6ML injection Inject 6 mg into the skin See admin instructions. To be administered 3 days after chemo therapy treatment   . predniSONE (DELTASONE) 20 MG tablet TAKE 5 TABLETS BY MOUTH ONCE DAILY ON DAYS 1-5 OF CHEMOTHERAPY.   Marland Kitchen prochlorperazine (COMPAZINE) 10 MG tablet Take 1 tablet (10 mg total) by mouth every 6 (six) hours as needed for nausea or vomiting.   . triamterene-hydrochlorothiazide (MAXZIDE-25) 37.5-25 MG per tablet Take 1 tablet by mouth daily.   03/23/2018: On hold  . valACYclovir (VALTREX) 500 MG tablet TAKE 1 TABLET BY MOUTH ONCE DAILY.   . vinCRIStine 2 mg in sodium chloride 0.9 % 50 mL Inject 2 mg into the vein every 21 ( twenty-one) days.   . vitamin B-12 (CYANOCOBALAMIN) 1000 MCG tablet Take 1 tablet (1,000 mcg total) by mouth daily.   . vitamin C (ASCORBIC ACID) 500 MG tablet Take 500 mg by mouth 2 (two) times daily. 03/23/2018: On hold  . zolpidem (AMBIEN) 5 MG tablet Take 1 tablet (5 mg total) by mouth at bedtime as needed for sleep.   . [DISCONTINUED] HYDROcodone-acetaminophen (NORCO) 5-325 MG tablet Take 1 tablet by mouth every 6 (six) hours as needed for moderate pain. (Patient not taking: Reported on 05/04/2018)   . [DISCONTINUED] potassium chloride SA (K-DUR,KLOR-CON) 20 MEQ tablet     Facility-Administered Encounter Medications as of 05/13/2018  Medication  . Chlorhexidine Gluconate Cloth 2 % PADS 6 each   And  . Chlorhexidine Gluconate Cloth 2 % PADS 6 each  . diphenhydrAMINE (BENADRYL) injection 25 mg  . sodium chloride flush (NS) 0.9 % injection 10 mL    ALLERGIES:  Allergies  Allergen Reactions  . Doxycycline Anaphylaxis  . Penicillins Anaphylaxis    Did it involve swelling of the face/tongue/throat, SOB, or low BP? Yes-SOB Did it involve sudden or severe rash/hives,  skin peeling, or any reaction on the inside of your mouth or nose? Unknown Did you need to seek medical attention at a hospital or  doctor's office? Yes When did it last happen?Over 10 years ago If all above answers are "NO", may proceed with cephalosporin use.   . Levofloxacin Nausea And Vomiting  . Sulfa Antibiotics Itching and Rash     PHYSICAL EXAM:  ECOG Performance status: 1  Vitals:   05/13/18 0805  BP: 107/64  Pulse: (!) 110  Resp: 16  Temp: 98 F (36.7 C)  SpO2: 100%   Filed Weights   05/13/18 0805  Weight: 117 lb (53.1 kg)    Physical Exam Vitals signs reviewed.  Constitutional:      Appearance: Normal appearance.  Cardiovascular:     Rate and Rhythm: Normal rate and regular rhythm.     Heart sounds: Normal heart sounds.  Pulmonary:     Effort: Pulmonary effort is normal.     Breath sounds: Normal breath sounds.  Abdominal:     General: There is no distension.     Palpations: Abdomen is soft. There is no mass.  Musculoskeletal:        General: No swelling.  Skin:    General: Skin is warm.  Neurological:     General: No focal deficit present.     Mental Status: She is alert and oriented to person, place, and time.  Psychiatric:        Mood and Affect: Mood normal.        Behavior: Behavior normal.      LABORATORY DATA:  I have reviewed the labs as listed.  CBC    Component Value Date/Time   WBC 3.1 (L) 05/13/2018 0807   RBC 2.41 (L) 05/13/2018 0807   HGB 8.1 (L) 05/13/2018 0807   HCT 25.0 (L) 05/13/2018 0807   HCT 21.1 (L) 01/19/2018 0747   PLT 89 (L) 05/13/2018 0807   MCV 103.7 (H) 05/13/2018 0807   MCH 33.6 05/13/2018 0807   MCHC 32.4 05/13/2018 0807   RDW 22.5 (H) 05/13/2018 0807   LYMPHSABS 0.2 (L) 05/13/2018 0807   MONOABS 0.2 05/13/2018 0807   EOSABS 0.0 05/13/2018 0807   BASOSABS 0.0 05/13/2018 0807   CMP Latest Ref Rng & Units 05/13/2018 05/11/2018 05/04/2018  Glucose 70 - 99 mg/dL 242(H) 213(H) 294(H)  BUN 6 - 20 mg/dL  24(H) 22(H) 26(H)  Creatinine 0.44 - 1.00 mg/dL 0.78 0.84 0.91  Sodium 135 - 145 mmol/L 140 141 141  Potassium 3.5 - 5.1 mmol/L 4.5 4.6 3.6  Chloride 98 - 111 mmol/L 109 106 111  CO2 22 - 32 mmol/L 21(L) 22 18(L)  Calcium 8.9 - 10.3 mg/dL 9.2 9.6 8.9  Total Protein 6.5 - 8.1 g/dL 5.9(L) 6.0(L) 5.7(L)  Total Bilirubin 0.3 - 1.2 mg/dL 0.5 0.8 0.2(L)  Alkaline Phos 38 - 126 U/L 80 80 88  AST 15 - 41 U/L '18 17 17  '$ ALT 0 - 44 U/L '15 14 20       '$ DIAGNOSTIC IMAGING:  I have independently reviewed the scans and discussed with the patient.   I have reviewed Venita Lick LPN's note and agree with the documentation.  I personally performed a face-to-face visit, made revisions and my assessment and plan is as follows.    ASSESSMENT & PLAN:   Lymphoma, peripheral T-cell, spleen (H[facility]) 1.  PTCL, NOS: -Presentation with pancytopenia, night sweats and fevers for last 1 to 2 months. - CT CAP shows splenomegaly with no lymphadenopathy. - Bone marrow biopsy on 01/21/2018 shows hypercellular marrow involved by T-cell lymphoma, small to medium in size, CD3, CD5 and  CD4 positive.  T cells do not express TDT, CD1a, CD10, CD15, CD30, CD34, CD56, CD 117 or CD99.  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 - Prognostic index for PTCL (PIT) has 2 positive factors including serum LDH above normal and bone marrow involvement. - She is not a candidate for brentuximab-based regimen as CD30 was negative. -We talked about first-line therapy with CHOP-based regimen.  I have also talked to her about CHOEP regimen which has shown to be helpful for patients less than 60 and normal LDH. - We will obtain a baseline PET CT scan. - She has a PICC line placed for chemotherapy administration. -2D echo on 01/24/2018 shows ejection fraction of 65 to 70%. -PET/CT scan on 02/02/2018 shows splenomegaly with no hypermetabolism.  No enlarged or hypermetabolic adenopathy in the neck chest abdomen or  pelvis. - 4 cycles of CHOEP from 01/28/2018 through 04/01/2018. -She met with Dr. Minna Antis at [facility] for consolidation transplant. -PET/CT scan on 04/13/2018 shows no FDG mass or adenopathy.  Decrease in size of the spleen measuring 13 cm, previously 16 cm.  Considerable heterogeneous uptake throughout the axial and appendicular skeleton most notably in the thoracic and lumbar spine.  I think this is from G-CSF. -Bone marrow biopsy on 04/16/2018 shows atypical T-cell proliferation, persistent.  The extensive nature of T-cell infiltrate is concerning for a neoplastic T-cell lymphoproliferative process.  T-cell receptor PCR is polyclonal.  Additional analysis for expression of BCL 6, CXCL13 and PD1 were sent. -She was evaluated by Dr. Minna Antis at [facility] again.  She was felt to have poor functional status for transplant.  They have recommended about a month after her last cycle for her to regain her performance status. -I have also called and talked to Dr. Minna Antis about pathology report.  As there is improvement in her blood counts and decreasing spleen volume, it is highly unlikely that it is a malignant process.  Hence she was recommended to proceed with her final cycle.  She will come back next week for her cycle 6.  2.  Hypomagnesemia: -Magnesium is 1.8.  We will continue magnesium 400 mg 3 times a day.     Total time spent is 25 minutes with more than 50% of the time spent face-to-face discussing treatment plan, blood results and coordination of care.     Orders placed this encounter:  No orders of the defined types were placed in this encounter.     Derek Jack, MD [place] [phone]

## 2018-05-13 NOTE — Progress Notes (Signed)
No treatment today per MD. Vitals stable and discharged home from clinic ambulatory. Follow up as scheduled. ° °

## 2018-05-14 ENCOUNTER — Ambulatory Visit (HOSPITAL_COMMUNITY): Payer: Commercial Managed Care - PPO

## 2018-05-15 ENCOUNTER — Ambulatory Visit (HOSPITAL_COMMUNITY): Payer: Commercial Managed Care - PPO

## 2018-05-16 ENCOUNTER — Other Ambulatory Visit (HOSPITAL_COMMUNITY): Payer: Self-pay | Admitting: Hematology

## 2018-05-18 ENCOUNTER — Other Ambulatory Visit (HOSPITAL_COMMUNITY): Payer: Self-pay | Admitting: Hematology

## 2018-05-18 ENCOUNTER — Other Ambulatory Visit (HOSPITAL_COMMUNITY): Payer: Self-pay | Admitting: *Deleted

## 2018-05-18 MED ORDER — ZOLPIDEM TARTRATE 10 MG PO TABS: 10.0000 mg | ORAL_TABLET | Freq: Every evening | ORAL | 1 refills | Status: DC | PRN

## 2018-05-20 ENCOUNTER — Other Ambulatory Visit: Payer: Self-pay

## 2018-05-20 ENCOUNTER — Encounter (HOSPITAL_COMMUNITY): Payer: Self-pay

## 2018-05-20 ENCOUNTER — Inpatient Hospital Stay (HOSPITAL_COMMUNITY): Payer: Commercial Managed Care - PPO

## 2018-05-20 ENCOUNTER — Inpatient Hospital Stay (HOSPITAL_COMMUNITY): Payer: Commercial Managed Care - PPO | Attending: Hematology

## 2018-05-20 VITALS — BP 135/72 | HR 85 | Temp 97.8°F | Resp 16

## 2018-05-20 DIAGNOSIS — R05 Cough: Secondary | ICD-10-CM | POA: Insufficient documentation

## 2018-05-20 DIAGNOSIS — Z7984 Long term (current) use of oral hypoglycemic drugs: Secondary | ICD-10-CM | POA: Insufficient documentation

## 2018-05-20 DIAGNOSIS — C8447 Peripheral T-cell lymphoma, not classified, spleen: Secondary | ICD-10-CM | POA: Diagnosis present

## 2018-05-20 DIAGNOSIS — I10 Essential (primary) hypertension: Secondary | ICD-10-CM | POA: Insufficient documentation

## 2018-05-20 DIAGNOSIS — Z79899 Other long term (current) drug therapy: Secondary | ICD-10-CM | POA: Insufficient documentation

## 2018-05-20 DIAGNOSIS — E876 Hypokalemia: Secondary | ICD-10-CM | POA: Insufficient documentation

## 2018-05-20 DIAGNOSIS — Z5111 Encounter for antineoplastic chemotherapy: Secondary | ICD-10-CM | POA: Diagnosis not present

## 2018-05-20 DIAGNOSIS — R161 Splenomegaly, not elsewhere classified: Secondary | ICD-10-CM | POA: Diagnosis not present

## 2018-05-20 DIAGNOSIS — Z85828 Personal history of other malignant neoplasm of skin: Secondary | ICD-10-CM | POA: Insufficient documentation

## 2018-05-20 DIAGNOSIS — E119 Type 2 diabetes mellitus without complications: Secondary | ICD-10-CM | POA: Diagnosis not present

## 2018-05-20 DIAGNOSIS — E039 Hypothyroidism, unspecified: Secondary | ICD-10-CM | POA: Diagnosis not present

## 2018-05-20 DIAGNOSIS — R112 Nausea with vomiting, unspecified: Secondary | ICD-10-CM | POA: Insufficient documentation

## 2018-05-20 LAB — CBC WITH DIFFERENTIAL/PLATELET
Abs Immature Granulocytes: 0.18 10*3/uL — ABNORMAL HIGH (ref 0.00–0.07)
Basophils Absolute: 0 10*3/uL (ref 0.0–0.1)
Basophils Relative: 1 %
Eosinophils Absolute: 0 10*3/uL (ref 0.0–0.5)
Eosinophils Relative: 0 %
HCT: 33 % — ABNORMAL LOW (ref 36.0–46.0)
Hemoglobin: 11.2 g/dL — ABNORMAL LOW (ref 12.0–15.0)
Immature Granulocytes: 3 %
Lymphocytes Relative: 16 %
Lymphs Abs: 0.8 10*3/uL (ref 0.7–4.0)
MCH: 35 pg — ABNORMAL HIGH (ref 26.0–34.0)
MCHC: 33.9 g/dL (ref 30.0–36.0)
MCV: 103.1 fL — ABNORMAL HIGH (ref 80.0–100.0)
Monocytes Absolute: 0.4 10*3/uL (ref 0.1–1.0)
Monocytes Relative: 7 %
Neutro Abs: 4 10*3/uL (ref 1.7–7.7)
Neutrophils Relative %: 73 %
Platelets: 100 10*3/uL — ABNORMAL LOW (ref 150–400)
RBC: 3.2 MIL/uL — ABNORMAL LOW (ref 3.87–5.11)
RDW: 22.4 % — ABNORMAL HIGH (ref 11.5–15.5)
WBC: 5.4 10*3/uL (ref 4.0–10.5)
nRBC: 0.6 % — ABNORMAL HIGH (ref 0.0–0.2)

## 2018-05-20 LAB — COMPREHENSIVE METABOLIC PANEL
ALT: 36 U/L (ref 0–44)
AST: 20 U/L (ref 15–41)
Albumin: 4.1 g/dL (ref 3.5–5.0)
Alkaline Phosphatase: 84 U/L (ref 38–126)
Anion gap: 15 (ref 5–15)
BUN: 46 mg/dL — ABNORMAL HIGH (ref 6–20)
CO2: 21 mmol/L — ABNORMAL LOW (ref 22–32)
Calcium: 9.9 mg/dL (ref 8.9–10.3)
Chloride: 101 mmol/L (ref 98–111)
Creatinine, Ser: 1.03 mg/dL — ABNORMAL HIGH (ref 0.44–1.00)
GFR calc Af Amer: >60 mL/min (ref >60)
GFR calc non Af Amer: 59 mL/min — ABNORMAL LOW (ref >60)
Glucose, Bld: 304 mg/dL — ABNORMAL HIGH (ref 70–99)
Potassium: 4.5 mmol/L (ref 3.5–5.1)
Sodium: 137 mmol/L (ref 135–145)
Total Bilirubin: 0.8 mg/dL (ref 0.3–1.2)
Total Protein: 6.1 g/dL — ABNORMAL LOW (ref 6.5–8.1)

## 2018-05-20 MED ORDER — PALONOSETRON HCL INJECTION 0.25 MG/5ML
0.2500 mg | Freq: Once | INTRAVENOUS | Status: AC
  Administered 2018-05-20: 10:00:00 0.25 mg via INTRAVENOUS
  Filled 2018-05-20: qty 5

## 2018-05-20 MED ORDER — DOXORUBICIN HCL CHEMO IV INJECTION 2 MG/ML
50.0000 mg/m2 | Freq: Once | INTRAVENOUS | Status: AC
  Administered 2018-05-20: 76 mg via INTRAVENOUS
  Filled 2018-05-20: qty 38

## 2018-05-20 MED ORDER — SODIUM CHLORIDE 0.9 % IV SOLN
Freq: Once | INTRAVENOUS | Status: AC
  Administered 2018-05-20: 10:00:00 via INTRAVENOUS
  Filled 2018-05-20: qty 5

## 2018-05-20 MED ORDER — SODIUM CHLORIDE 0.9 % IV SOLN
Freq: Once | INTRAVENOUS | Status: AC
  Administered 2018-05-20: 10:00:00 via INTRAVENOUS

## 2018-05-20 MED ORDER — HEPARIN SOD (PORK) LOCK FLUSH 100 UNIT/ML IV SOLN
500.0000 [IU] | Freq: Once | INTRAVENOUS | Status: AC | PRN
  Administered 2018-05-20: 500 [IU]

## 2018-05-20 MED ORDER — VINCRISTINE SULFATE CHEMO INJECTION 1 MG/ML
2.0000 mg | Freq: Once | INTRAVENOUS | Status: AC
  Administered 2018-05-20: 2 mg via INTRAVENOUS
  Filled 2018-05-20: qty 2

## 2018-05-20 MED ORDER — SODIUM CHLORIDE 0.9% FLUSH
10.0000 mL | INTRAVENOUS | Status: DC | PRN
  Administered 2018-05-20: 10 mL
  Filled 2018-05-20: qty 10

## 2018-05-20 MED ORDER — SODIUM CHLORIDE 0.9 % IV SOLN
100.0000 mg/m2 | Freq: Once | INTRAVENOUS | Status: AC
  Administered 2018-05-20: 150 mg via INTRAVENOUS
  Filled 2018-05-20: qty 7.5

## 2018-05-20 MED ORDER — SODIUM CHLORIDE 0.9 % IV SOLN
750.0000 mg/m2 | Freq: Once | INTRAVENOUS | Status: AC
  Administered 2018-05-20: 1140 mg via INTRAVENOUS
  Filled 2018-05-20: qty 57

## 2018-05-20 MED ORDER — SODIUM CHLORIDE 0.9 % IV SOLN
INTRAVENOUS | Status: DC
  Administered 2018-05-20: 10:00:00 via INTRAVENOUS

## 2018-05-20 NOTE — Patient Instructions (Signed)
[facility] Discharge Instructions for Patients Receiving Chemotherapy  Today you received the following chemotherapy agents   To help prevent nausea and vomiting after your treatment, we encourage you to take your nausea medication   If you develop nausea and vomiting that is not controlled by your nausea medication, call the clinic.   BELOW ARE SYMPTOMS THAT SHOULD BE REPORTED IMMEDIATELY:  *FEVER GREATER THAN 100.5 F  *CHILLS WITH OR WITHOUT FEVER  NAUSEA AND VOMITING THAT IS NOT CONTROLLED WITH YOUR NAUSEA MEDICATION  *UNUSUAL SHORTNESS OF BREATH  *UNUSUAL BRUISING OR BLEEDING  TENDERNESS IN MOUTH AND THROAT WITH OR WITHOUT PRESENCE OF ULCERS  *URINARY PROBLEMS  *BOWEL PROBLEMS  UNUSUAL RASH Items with * indicate a potential emergency and should be followed up as soon as possible.  Feel free to call the clinic should you have any questions or concerns. The clinic phone number is [phone].  Please show the CHEMO ALERT CARD at check-in to the Emergency Department and triage nurse.   

## 2018-05-20 NOTE — Progress Notes (Signed)
Labs reviewed with MD. Bun and creatinine elevated, MD aware, will give 500 ml bolus today per MD. Proceed with treatment.

## 2018-05-21 ENCOUNTER — Encounter (HOSPITAL_COMMUNITY): Payer: Self-pay | Admitting: Hematology

## 2018-05-21 ENCOUNTER — Other Ambulatory Visit (HOSPITAL_COMMUNITY): Payer: Self-pay | Admitting: Nurse Practitioner

## 2018-05-21 ENCOUNTER — Inpatient Hospital Stay (HOSPITAL_BASED_OUTPATIENT_CLINIC_OR_DEPARTMENT_OTHER): Payer: Commercial Managed Care - PPO | Admitting: Hematology

## 2018-05-21 ENCOUNTER — Inpatient Hospital Stay (HOSPITAL_COMMUNITY): Payer: Commercial Managed Care - PPO

## 2018-05-21 VITALS — BP 111/74 | HR 109 | Temp 98.1°F | Resp 18 | Wt 113.0 lb

## 2018-05-21 VITALS — BP 136/78 | HR 90 | Temp 97.9°F | Resp 18

## 2018-05-21 DIAGNOSIS — R112 Nausea with vomiting, unspecified: Secondary | ICD-10-CM

## 2018-05-21 DIAGNOSIS — C8447 Peripheral T-cell lymphoma, not classified, spleen: Secondary | ICD-10-CM

## 2018-05-21 DIAGNOSIS — R05 Cough: Secondary | ICD-10-CM

## 2018-05-21 DIAGNOSIS — Z79899 Other long term (current) drug therapy: Secondary | ICD-10-CM

## 2018-05-21 LAB — SAMPLE TO BLOOD BANK

## 2018-05-21 MED ORDER — SODIUM CHLORIDE 0.9 % IV SOLN
100.0000 mg/m2 | Freq: Once | INTRAVENOUS | Status: AC
  Administered 2018-05-21: 150 mg via INTRAVENOUS
  Filled 2018-05-21: qty 7.5

## 2018-05-21 MED ORDER — SODIUM CHLORIDE 0.9 % IV SOLN
10.0000 mg | Freq: Once | INTRAVENOUS | Status: AC
  Administered 2018-05-21: 10 mg via INTRAVENOUS
  Filled 2018-05-21: qty 10

## 2018-05-21 MED ORDER — SODIUM CHLORIDE 0.9 % IV SOLN
INTRAVENOUS | Status: DC
  Administered 2018-05-21: 09:00:00 via INTRAVENOUS

## 2018-05-21 MED ORDER — HEPARIN SOD (PORK) LOCK FLUSH 100 UNIT/ML IV SOLN
500.0000 [IU] | Freq: Once | INTRAVENOUS | Status: AC
  Administered 2018-05-21: 500 [IU] via INTRAVENOUS

## 2018-05-21 MED ORDER — SODIUM CHLORIDE 0.9% FLUSH
10.0000 mL | Freq: Once | INTRAVENOUS | Status: AC
  Administered 2018-05-21: 10 mL

## 2018-05-21 NOTE — Progress Notes (Signed)
Pt presents today for treatment and office visit. VSS. Pt has no complaints of any changes since the last visit. Reported heart rate of 110. MD aware and verbal order received to proceed with treatment.   Treatment given today per MD orders. Tolerated infusion without adverse affects. Vital signs stable. No complaints at this time. Discharged from clinic ambulatory. F/U with [facility] as scheduled.

## 2018-05-21 NOTE — Progress Notes (Signed)
Kristin Good, [place] [id number]   CLINIC:  Medical Oncology/Hematology  PCP:  Marinda Elk, MD [place] [address] [phone]   REASON FOR VISIT:  Follow-up for PTC L.   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[place])    01/28/2018 -  Chemotherapy    The patient had DOXOrubicin (ADRIAMYCIN) chemo injection 50 mg, 30 mg/m2 = 50 mg (60 % of original dose 50 mg/m2), Intravenous,  Once, 6 of 8 cycles Dose modification: 30 mg/m2 (60 % of original dose 50 mg/m2, Cycle 1, Reason: Other (see comments), Comment: elevated bilirubin) Administration: 50 mg (01/28/2018), 76 mg (03/11/2018), 76 mg (02/18/2018), 76 mg (04/01/2018), 76 mg (04/21/2018), 76 mg (05/20/2018) palonosetron (ALOXI) injection 0.25 mg, 0.25 mg, Intravenous,  Once, 6 of 8 cycles Administration: 0.25 mg (01/28/2018), 0.25 mg (02/18/2018), 0.25 mg (03/11/2018), 0.25 mg (04/01/2018), 0.25 mg (04/21/2018), 0.25 mg (05/20/2018) pegfilgrastim (NEULASTA ONPRO KIT) injection 6 mg, 6 mg, Subcutaneous, Once, 6 of 8 cycles Administration: 6 mg (02/20/2018), 6 mg (04/03/2018), 6 mg (04/23/2018) vinCRIStine (ONCOVIN) 1 mg in sodium chloride 0.9 % 50 mL chemo infusion, 1 mg (50 % of original dose 2 mg), Intravenous,  Once, 6 of 8 cycles Dose modification: 1 mg (50 % of original dose 2 mg, Cycle 1, Reason: Other (see comments), Comment: liver dysfunctyion) Administration: 1 mg (01/28/2018), 2 mg (03/11/2018), 2 mg (02/18/2018), 2 mg (04/01/2018), 2 mg (04/21/2018), 2 mg (05/20/2018) cyclophosphamide (CYTOXAN) 1,260 mg in sodium chloride 0.9 % 250 mL chemo infusion, 750 mg/m2 = 1,260 mg, Intravenous,  Once, 6 of 8 cycles Administration: 1,260 mg (01/28/2018), 1,140 mg (03/11/2018), 1,140 mg (02/18/2018), 1,140 mg (04/01/2018), 1,140 mg (04/21/2018), 1,140 mg (05/20/2018) etoposide (VEPESID) 130 mg  in sodium chloride 0.9 % 500 mL chemo infusion, 75 mg/m2 = 130 mg (75 % of original dose 100 mg/m2), Intravenous,  Once, 6 of 8 cycles Dose modification: 75 mg/m2 (75 % of original dose 100 mg/m2, Cycle 1, Reason: Change in LFTs), 75 mg/m2 (75 % of original dose 100 mg/m2, Cycle 1, Reason: Change in LFTs) Administration: 130 mg (01/28/2018), 130 mg (01/29/2018), 130 mg (01/30/2018), 150 mg (02/19/2018), 150 mg (02/20/2018), 150 mg (03/12/2018), 150 mg (03/13/2018), 150 mg (04/02/2018), 150 mg (04/03/2018), 150 mg (04/22/2018), 150 mg (04/23/2018), 150 mg (05/21/2018), 150 mg (03/11/2018), 150 mg (02/18/2018), 150 mg (04/01/2018), 150 mg (04/21/2018), 150 mg (05/20/2018) fosaprepitant (EMEND) 150 mg, dexamethasone (DECADRON) 12 mg in sodium chloride 0.9 % 145 mL IVPB, , Intravenous,  Once, 6 of 6 cycles Administration:  (01/28/2018),  (02/18/2018),  (03/11/2018),  (04/01/2018),  (04/21/2018),  (05/20/2018)  for chemotherapy treatment.       CANCER STAGING: Cancer Staging No matching staging information was found for the patient.   INTERVAL HISTORY:  Kristin Good 58 y.o. female returns for routine follow-up and consideration for next cycle of chemotherapy. She is here today alone. She states that she has been walking a mile to a mile and a half every day. She states that she has been doing well since her last visit. She states that she feels stronger. Denies any nausea, vomiting, or diarrhea. Denies any new pains. Had not noticed any recent bleeding such as epistaxis, hematuria or hematochezia. Denies recent chest pain on exertion, shortness of breath on minimal exertion, pre-syncopal episodes, or palpitations. Denies any numbness  or tingling in hands or feet. Denies any recent fevers, infections, or recent hospitalizations. Patient reports appetite at 100% and energy level at 75%.     REVIEW OF SYSTEMS:  Review of Systems  Constitutional: Positive for fatigue.  All other systems reviewed and are negative.    PAST  MEDICAL/SURGICAL HISTORY:  Past Medical History:  Diagnosis Date  . Diabetes ([place]) 01/20/2018  . HTN (hypertension)   . Hypothyroidism   . 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 COLONOSCOPY  07/27/2010   Procedure: COLONOSCOPY;  Surgeon: Dorothyann Peng, MD;  Location: [facility] SU[facility];  Service: Endoscopy;  Laterality: N/A;  . infertility surgery    . MOHS SURGERY     basal cell carcinoma  . PORTACATH PLACEMENT Left 04/01/2018   Procedure: INSERTION PORT-A-CATH;  Surgeon: Aviva Signs, MD;  Location: [facility] ORS;  Service: General;  Laterality: Left;  . REDUCTION MAMMAPLASTY Bilateral 2002     SOCIAL HISTORY:  Social History   Socioeconomic History  . Marital status: Married    Spouse name: Not on file  . Number of children: 0  . Years of education: Not on file  . Highest education level: Not on file  Occupational History  . Occupation: [employer]    Comment: Nurse  Social Needs  . Financial resource strain: Not on file  . Food insecurity:    Worry: Not on file    Inability: Not on file  . Transportation needs:    Medical: Not on file    Non-medical: Not on file  Tobacco Use  . Smoking status: Never Smoker  . Smokeless tobacco: Never Used  Substance and Sexual Activity  . Alcohol use: Yes    Comment: once or twice a year  . Drug use: No  . Sexual activity: Not on file  Lifestyle  . Physical activity:    Days per week: Not on file    Minutes per session: Not on file  . Stress: Not on file  Relationships  . Social connections:    Talks on phone: Not on file    Gets together: Not on file    Attends religious service: Not on file    Active member of club or organization: Not on file    Attends meetings of clubs or organizations: Not on file    Relationship status: Not on file  . Intimate partner violence:    Fear of current or ex partner: Not on file    Emotionally abused: Not on file     Physically abused: Not on file    Forced sexual activity: Not on file  Other Topics Concern  . Not on file  Social History Narrative  . Not on file    FAMILY HISTORY:  Family History  Problem Relation Age of Onset  . Heart disease Mother   . Diabetes Father   . Breast cancer Sister 41  . Breast cancer Maternal Aunt   . Colon cancer Neg Hx   . Liver disease Neg Hx     CURRENT MEDICATIONS:  Outpatient Encounter Medications as of 05/21/2018  Medication Sig Note  . allopurinol (ZYLOPRIM) 300 MG tablet Take 1 tablet (300 mg total) by mouth daily.   Marland Kitchen atorvastatin (LIPITOR) 20 MG tablet Take 20 mg by mouth every morning.    . calcium-vitamin D (CALCIUM 500+D HIGH POTENCY) 500-400 MG-UNIT tablet Take 1 tablet by mouth 2 (  two) times daily. 03/23/2018: On hold  . ciprofloxacin (CIPRO) 500 MG tablet TAKE 1 TABLET BY MOUTH TWICE DAILY   . cyclophosphamide in sodium chloride 0.9 % 250 mL Inject 1,260 mg into the vein every 21 ( twenty-one) days.   Marland Kitchen DOXOrubicin HCl (ADRIAMYCIN IV) Inject 84 mg into the vein every 21 ( twenty-one) days.   . ETOPOSIDE IV Inject 170 mg into the vein every 21 ( twenty-one) days. Days 1-3   . fluconazole (DIFLUCAN) 100 MG tablet TAKE 1 TABLET BY MOUTH ONCE DAILY.   . furosemide (LASIX) 20 MG tablet Take 1 tablet (20 mg total) by mouth daily as needed.   . Glucosamine-Chondroit-Vit C-Mn (GLUCOSAMINE 1500 COMPLEX PO) Take 1 capsule by mouth 2 (two) times daily. 03/23/2018: On hold  . levothyroxine (SYNTHROID, LEVOTHROID) 75 MCG tablet Take 75 mcg by mouth daily before breakfast.    . lidocaine-prilocaine (EMLA) cream Apply 1 application topically as needed. (Patient not taking: Reported on 05/13/2018)   . loperamide (IMODIUM) 2 MG capsule Take 2 mg by mouth as needed for diarrhea or loose stools.   . magic mouthwash SOLN Take 5 mLs by mouth 3 (three) times daily as needed for mouth pain. With lidocaine 1% 1:1 solution (Patient not taking: Reported on 05/13/2018)   .  metFORMIN (GLUCOPHAGE) 1000 MG tablet Take 1,000 mg by mouth 2 (two) times daily with a meal.    . pegfilgrastim (NEULASTA) 6 MG/0.6ML injection Inject 6 mg into the skin See admin instructions. To be administered 3 days after chemo therapy treatment   . predniSONE (DELTASONE) 20 MG tablet TAKE 5 TABLETS BY MOUTH ONCE DAILY ON DAYS 1-5 OF CHEMOTHERAPY.   Marland Kitchen prochlorperazine (COMPAZINE) 10 MG tablet Take 1 tablet (10 mg total) by mouth every 6 (six) hours as needed for nausea or vomiting.   . triamterene-hydrochlorothiazide (MAXZIDE-25) 37.5-25 MG per tablet Take 1 tablet by mouth daily.   03/23/2018: On hold  . valACYclovir (VALTREX) 500 MG tablet TAKE 1 TABLET BY MOUTH ONCE DAILY.   . vinCRIStine 2 mg in sodium chloride 0.9 % 50 mL Inject 2 mg into the vein every 21 ( twenty-one) days.   . vitamin B-12 (CYANOCOBALAMIN) 1000 MCG tablet Take 1 tablet (1,000 mcg total) by mouth daily.   . vitamin C (ASCORBIC ACID) 500 MG tablet Take 500 mg by mouth 2 (two) times daily. 03/23/2018: On hold  . zolpidem (AMBIEN) 10 MG tablet Take 1 tablet (10 mg total) by mouth at bedtime as needed for sleep.   . [DISCONTINUED] magnesium oxide (MAG-OX) 400 (241.3 Mg) MG tablet Take 1 tablet (400 mg total) by mouth 2 (two) times daily. (Patient taking differently: Take 400 mg by mouth 3 (three) times daily. )    Facility-Administered Encounter Medications as of 05/21/2018  Medication  . Chlorhexidine Gluconate Cloth 2 % PADS 6 each   And  . Chlorhexidine Gluconate Cloth 2 % PADS 6 each  . [COMPLETED] dexamethasone (DECADRON) 10 mg in sodium chloride 0.9 % 50 mL IVPB  . diphenhydrAMINE (BENADRYL) injection 25 mg  . [COMPLETED] etoposide (VEPESID) 150 mg in sodium chloride 0.9 % 500 mL chemo infusion  . [COMPLETED] heparin lock flush 100 unit/mL  . sodium chloride flush (NS) 0.9 % injection 10 mL  . [COMPLETED] sodium chloride flush (NS) 0.9 % injection 10 mL  . [DISCONTINUED] 0.9 %  sodium chloride infusion    ALLERGIES:   Allergies  Allergen Reactions  . Doxycycline Anaphylaxis  . Penicillins Anaphylaxis  Did it involve swelling of the face/tongue/throat, SOB, or low BP? Yes-SOB Did it involve sudden or severe rash/hives, skin peeling, or any reaction on the inside of your mouth or nose? Unknown Did you need to seek medical attention at a hospital or doctor's office? Yes When did it last happen?Over 10 years ago If all above answers are "NO", may proceed with cephalosporin use.   . Levofloxacin Nausea And Vomiting  . Sulfa Antibiotics Itching and Rash     PHYSICAL EXAM:  ECOG Performance status: 1  Vitals:   05/21/18 0816  BP: 111/74  Pulse: (!) 109  Resp: 18  Temp: 98.1 F (36.7 C)  SpO2: 100%   Filed Weights   05/21/18 0816  Weight: 113 lb (51.3 kg)    Physical Exam Vitals signs reviewed.  Constitutional:      Appearance: Normal appearance.  Cardiovascular:     Rate and Rhythm: Normal rate and regular rhythm.     Heart sounds: Normal heart sounds.  Pulmonary:     Effort: Pulmonary effort is normal.     Breath sounds: Normal breath sounds.  Abdominal:     General: There is no distension.     Palpations: Abdomen is soft. There is no mass.  Musculoskeletal:        General: No swelling.  Skin:    General: Skin is warm.  Neurological:     General: No focal deficit present.     Mental Status: She is alert and oriented to person, place, and time.  Psychiatric:        Mood and Affect: Mood normal.        Behavior: Behavior normal.      LABORATORY DATA:  I have reviewed the labs as listed.  CBC    Component Value Date/Time   WBC 5.4 05/20/2018 0759   RBC 3.20 (L) 05/20/2018 0759   HGB 11.2 (L) 05/20/2018 0759   HCT 33.0 (L) 05/20/2018 0759   HCT 21.1 (L) 01/19/2018 0747   PLT 100 (L) 05/20/2018 0759   MCV 103.1 (H) 05/20/2018 0759   MCH 35.0 (H) 05/20/2018 0759   MCHC 33.9 05/20/2018 0759   RDW 22.4 (H) 05/20/2018 0759   LYMPHSABS 0.8 05/20/2018 0759    MONOABS 0.4 05/20/2018 0759   EOSABS 0.0 05/20/2018 0759   BASOSABS 0.0 05/20/2018 0759   CMP Latest Ref Rng & Units 05/20/2018 05/13/2018 05/11/2018  Glucose 70 - 99 mg/dL 304(H) 242(H) 213(H)  BUN 6 - 20 mg/dL 46(H) 24(H) 22(H)  Creatinine 0.44 - 1.00 mg/dL 1.03(H) 0.78 0.84  Sodium 135 - 145 mmol/L 137 140 141  Potassium 3.5 - 5.1 mmol/L 4.5 4.5 4.6  Chloride 98 - 111 mmol/L 101 109 106  CO2 22 - 32 mmol/L 21(L) 21(L) 22  Calcium 8.9 - 10.3 mg/dL 9.9 9.2 9.6  Total Protein 6.5 - 8.1 g/dL 6.1(L) 5.9(L) 6.0(L)  Total Bilirubin 0.3 - 1.2 mg/dL 0.8 0.5 0.8  Alkaline Phos 38 - 126 U/L 84 80 80  AST 15 - 41 U/L _0 ALT 0 - 44 U/L 36 15 14       DIAGNOSTIC IMAGING:  I have independently reviewed the scans and discussed with the patient.   I have reviewed Venita Lick LPN's note and agree with the documentation.  I personally performed a face-to-face visit, made revisions and my assessment and plan is as follows.    ASSESSMENT & PLAN:   Lymphoma, peripheral T-cell, spleen (H[facility]) 1.  PTCL,  NOS: -Presentation with pancytopenia, night sweats and fevers for last 1 to 2 months. - CT CAP shows splenomegaly with no lymphadenopathy. 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  T cells do not express TDT, CD1a, CD10, CD15, CD30, CD34, CD56, CD 117 or CD99.  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 - Prognostic index for PTCL (PIT) has 2 positive factors including serum LDH above normal and bone marrow involvement. - She is not a candidate for brentuximab-based regimen as CD30 was negative. -We talked about first-line therapy with CHOP-based regimen.  I have also talked to her about CHOEP regimen which has shown to be helpful for patients less than 60 and normal LDH. - We will obtain a baseline PET CT scan. - She has a PICC line placed for chemotherapy  administration. -2D echo on 01/24/2018 shows ejection fraction of 65 to 70%. -PET/CT scan on 02/02/2018 shows splenomegaly with no hypermetabolism.  No enlarged or hypermetabolic adenopathy in the neck chest abdomen or pelvis. - 4 cycles of CHOEP from 01/28/2018 through 04/01/2018. -She met with Dr. Minna Antis at [facility] for consolidation transplant. -PET/CT scan on 04/13/2018 shows no FDG mass or adenopathy.  Decrease in size of the spleen measuring 13 cm, previously 16 cm.  Considerable heterogeneous uptake throughout the axial and appendicular skeleton most notably in the thoracic and lumbar spine.  I think this is from G-CSF. -Bone marrow biopsy on 04/16/2018 shows atypical T-cell proliferation, persistent.  The extensive nature of T-cell infiltrate is concerning for a neoplastic T-cell lymphoproliferative process.  T-cell receptor PCR is polyclonal.  Additional analysis for expression of BCL 6, CXCL13 and PD1 were sent. -I have also called and talked to Dr. Minna Antis about pathology report.  As there is improvement in her blood counts and decreasing spleen volume, it is highly unlikely that it is a malignant process.  Hence she was recommended to proceed with her final cycle.   - She is walking a mile a day. She is also increasing her caloric intake. We are still awaiting final pathology of bone marrow biopsy. She proceeded with Cycle 6 yesterday. She will follow up with Dr. Minna Antis on May 19th at [facility] for consideration of allogeneic transplant. She will RTC in 4 weeks. We will plan to repeat scans at that time, if not ordered at [facility].   2.  Hypomagnesemia: -Magnesium is 1.8.  We will continue magnesium 400 mg 3 times a day.      Total time spent is 25 minutes with more than 50% of the time spent face-to-face discussing treatment plan, follow-up scans and coordination of care.    Orders placed this encounter:  Orders Placed This Encounter  Procedures  . NM PET Image Restag (PS) Skull Base To Thigh       Derek Jack, MD [place] [phone]

## 2018-05-21 NOTE — Assessment & Plan Note (Signed)
1.  PTCL, NOS: -Presentation with pancytopenia, night sweats and fevers for last 1 to 2 months. - CT CAP shows splenomegaly with no lymphadenopathy. 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  T cells do not express TDT, CD1a, CD10, CD15, CD30, CD34, CD56, CD 117 or CD99.  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 - Prognostic index for PTCL (PIT) has 2 positive factors including serum LDH above normal and bone marrow involvement. - She is not a candidate for brentuximab-based regimen as CD30 was negative. -We talked about first-line therapy with CHOP-based regimen.  I have also talked to her about CHOEP regimen which has shown to be helpful for patients less than 60 and normal LDH. - We will obtain a baseline PET CT scan. - She has a PICC line placed for chemotherapy administration. -2D echo on 01/24/2018 shows ejection fraction of 65 to 70%. -PET/CT scan on 02/02/2018 shows splenomegaly with no hypermetabolism.  No enlarged or hypermetabolic adenopathy in the neck chest abdomen or pelvis. - 4 cycles of CHOEP from 01/28/2018 through 04/01/2018. -She met with Dr. Minna Antis at [facility] for consolidation transplant. -PET/CT scan on 04/13/2018 shows no FDG mass or adenopathy.  Decrease in size of the spleen measuring 13 cm, previously 16 cm.  Considerable heterogeneous uptake throughout the axial and appendicular skeleton most notably in the thoracic and lumbar spine.  I think this is from G-CSF. -Bone marrow biopsy on 04/16/2018 shows atypical T-cell proliferation, persistent.  The extensive nature of T-cell infiltrate is concerning for a neoplastic T-cell lymphoproliferative process.  T-cell receptor PCR is polyclonal.  Additional analysis for expression of BCL 6, CXCL13 and PD1 were sent. -I have also called and talked to Dr. Minna Antis about pathology report.  As there is improvement in  her blood counts and decreasing spleen volume, it is highly unlikely that it is a malignant process.  Hence she was recommended to proceed with her final cycle.   - She is walking a mile a day. She is also increasing her caloric intake. We are still awaiting final pathology of bone marrow biopsy. She proceeded with Cycle 6 yesterday. She will follow up with Dr. Minna Antis on May 19th at [facility] for consideration of allogeneic transplant. She will RTC in 4 weeks. We will plan to repeat scans at that time, if not ordered at [facility].   2.  Hypomagnesemia: -Magnesium is 1.8.  We will continue magnesium 400 mg 3 times a day.

## 2018-05-21 NOTE — Patient Instructions (Signed)
[facility] Discharge Instructions for Patients Receiving Chemotherapy  Today you received the following chemotherapy agents   To help prevent nausea and vomiting after your treatment, we encourage you to take your nausea medication   If you develop nausea and vomiting that is not controlled by your nausea medication, call the clinic.   BELOW ARE SYMPTOMS THAT SHOULD BE REPORTED IMMEDIATELY:  *FEVER GREATER THAN 100.5 F  *CHILLS WITH OR WITHOUT FEVER  NAUSEA AND VOMITING THAT IS NOT CONTROLLED WITH YOUR NAUSEA MEDICATION  *UNUSUAL SHORTNESS OF BREATH  *UNUSUAL BRUISING OR BLEEDING  TENDERNESS IN MOUTH AND THROAT WITH OR WITHOUT PRESENCE OF ULCERS  *URINARY PROBLEMS  *BOWEL PROBLEMS  UNUSUAL RASH Items with * indicate a potential emergency and should be followed up as soon as possible.  Feel free to call the clinic should you have any questions or concerns. The clinic phone number is [phone].  Please show the CHEMO ALERT CARD at check-in to the Emergency Department and triage nurse.   

## 2018-05-21 NOTE — Patient Instructions (Signed)
[facility] at [facility] Discharge Instructions  You were seen today by Dr. Katragadda. He went over your recent lab results. He will see you back in 4 weeks for labs and follow up.   Thank you for choosing [facility] at [facility] to provide your oncology and hematology care.  To afford each patient quality time with our provider, please arrive at least 15 minutes before your scheduled appointment time.   If you have a lab appointment with the Cancer Center please come in thru the  Main Entrance and check in at the main information desk  You need to re-schedule your appointment should you arrive 10 or more minutes late.  We strive to give you quality time with our providers, and arriving late affects you and other patients whose appointments are after yours.  Also, if you no show three or more times for appointments you may be dismissed from the clinic at the providers discretion.     Again, thank you for choosing [facility].  Our hope is that these requests will decrease the amount of time that you wait before being seen by our physicians.       _____________________________________________________________  Should you have questions after your visit to [facility], please contact our office at [phone] between the hours of 8:00 a.m. and 4:30 p.m.  Voicemails left after 4:00 p.m. will not be returned until the following business day.  For prescription refill requests, have your pharmacy contact our office and allow 72 hours.    Cancer Center Support Programs:   > Cancer Support Group  2nd Tuesday of the month 1pm-2pm, Journey Room    

## 2018-05-22 ENCOUNTER — Other Ambulatory Visit: Payer: Self-pay

## 2018-05-22 ENCOUNTER — Inpatient Hospital Stay (HOSPITAL_COMMUNITY): Payer: Commercial Managed Care - PPO

## 2018-05-22 VITALS — BP 133/65 | HR 85 | Temp 97.8°F | Resp 18 | Wt 112.2 lb

## 2018-05-22 DIAGNOSIS — C8447 Peripheral T-cell lymphoma, not classified, spleen: Secondary | ICD-10-CM

## 2018-05-22 MED ORDER — SODIUM CHLORIDE 0.9 % IV SOLN
10.0000 mg | Freq: Once | INTRAVENOUS | Status: AC
  Administered 2018-05-22: 10 mg via INTRAVENOUS
  Filled 2018-05-22: qty 10

## 2018-05-22 MED ORDER — SODIUM CHLORIDE 0.9 % IV SOLN
100.0000 mg/m2 | Freq: Once | INTRAVENOUS | Status: AC
  Administered 2018-05-22: 150 mg via INTRAVENOUS
  Filled 2018-05-22: qty 7.5

## 2018-05-22 MED ORDER — SODIUM CHLORIDE 0.9 % IV SOLN
INTRAVENOUS | Status: DC
  Administered 2018-05-22: 09:00:00 via INTRAVENOUS

## 2018-05-22 MED ORDER — HEPARIN SOD (PORK) LOCK FLUSH 100 UNIT/ML IV SOLN
500.0000 [IU] | Freq: Once | INTRAVENOUS | Status: AC
  Administered 2018-05-22: 500 [IU] via INTRAVENOUS

## 2018-05-22 MED ORDER — PEGFILGRASTIM 6 MG/0.6ML ~~LOC~~ PSKT
6.0000 mg | PREFILLED_SYRINGE | Freq: Once | SUBCUTANEOUS | Status: AC
  Administered 2018-05-22: 6 mg via SUBCUTANEOUS
  Filled 2018-05-22: qty 0.6

## 2018-05-22 NOTE — Patient Instructions (Signed)
[facility] Discharge Instructions for Patients Receiving Chemotherapy  Today you received the following chemotherapy agents   To help prevent nausea and vomiting after your treatment, we encourage you to take your nausea medication   If you develop nausea and vomiting that is not controlled by your nausea medication, call the clinic.   BELOW ARE SYMPTOMS THAT SHOULD BE REPORTED IMMEDIATELY:  *FEVER GREATER THAN 100.5 F  *CHILLS WITH OR WITHOUT FEVER  NAUSEA AND VOMITING THAT IS NOT CONTROLLED WITH YOUR NAUSEA MEDICATION  *UNUSUAL SHORTNESS OF BREATH  *UNUSUAL BRUISING OR BLEEDING  TENDERNESS IN MOUTH AND THROAT WITH OR WITHOUT PRESENCE OF ULCERS  *URINARY PROBLEMS  *BOWEL PROBLEMS  UNUSUAL RASH Items with * indicate a potential emergency and should be followed up as soon as possible.  Feel free to call the clinic should you have any questions or concerns. The clinic phone number is [phone].  Please show the CHEMO ALERT CARD at check-in to the Emergency Department and triage nurse.   

## 2018-05-26 ENCOUNTER — Inpatient Hospital Stay (HOSPITAL_COMMUNITY): Payer: Commercial Managed Care - PPO

## 2018-05-26 ENCOUNTER — Other Ambulatory Visit: Payer: Self-pay

## 2018-05-26 DIAGNOSIS — C8447 Peripheral T-cell lymphoma, not classified, spleen: Secondary | ICD-10-CM

## 2018-05-26 DIAGNOSIS — D701 Agranulocytosis secondary to cancer chemotherapy: Secondary | ICD-10-CM | POA: Diagnosis not present

## 2018-05-26 DIAGNOSIS — D709 Neutropenia, unspecified: Secondary | ICD-10-CM | POA: Diagnosis not present

## 2018-05-26 LAB — COMPREHENSIVE METABOLIC PANEL
ALT: 17 U/L (ref 0–44)
AST: 13 U/L — ABNORMAL LOW (ref 15–41)
Albumin: 3.4 g/dL — ABNORMAL LOW (ref 3.5–5.0)
Alkaline Phosphatase: 74 U/L (ref 38–126)
Anion gap: 10 (ref 5–15)
BUN: 40 mg/dL — ABNORMAL HIGH (ref 6–20)
CO2: 21 mmol/L — ABNORMAL LOW (ref 22–32)
Calcium: 9.2 mg/dL (ref 8.9–10.3)
Chloride: 107 mmol/L (ref 98–111)
Creatinine, Ser: 0.97 mg/dL (ref 0.44–1.00)
GFR calc Af Amer: >60 mL/min (ref >60)
GFR calc non Af Amer: >60 mL/min (ref >60)
Glucose, Bld: 451 mg/dL — ABNORMAL HIGH (ref 70–99)
Potassium: 4.9 mmol/L (ref 3.5–5.1)
Sodium: 138 mmol/L (ref 135–145)
Total Bilirubin: 0.8 mg/dL (ref 0.3–1.2)
Total Protein: 5.6 g/dL — ABNORMAL LOW (ref 6.5–8.1)

## 2018-05-26 LAB — CBC WITH DIFFERENTIAL/PLATELET
Abs Immature Granulocytes: 0.37 10*3/uL — ABNORMAL HIGH (ref 0.00–0.07)
Basophils Absolute: 0 10*3/uL (ref 0.0–0.1)
Basophils Relative: 1 %
Eosinophils Absolute: 0 10*3/uL (ref 0.0–0.5)
Eosinophils Relative: 1 %
HCT: 30 % — ABNORMAL LOW (ref 36.0–46.0)
Hemoglobin: 9.8 g/dL — ABNORMAL LOW (ref 12.0–15.0)
Immature Granulocytes: 24 %
Lymphocytes Relative: 15 %
Lymphs Abs: 0.2 10*3/uL — ABNORMAL LOW (ref 0.7–4.0)
MCH: 34.6 pg — ABNORMAL HIGH (ref 26.0–34.0)
MCHC: 32.7 g/dL (ref 30.0–36.0)
MCV: 106 fL — ABNORMAL HIGH (ref 80.0–100.0)
Monocytes Absolute: 0 10*3/uL — ABNORMAL LOW (ref 0.1–1.0)
Monocytes Relative: 1 %
Neutro Abs: 0.9 10*3/uL — ABNORMAL LOW (ref 1.7–7.7)
Neutrophils Relative %: 58 %
Platelets: 24 10*3/uL — CL (ref 150–400)
RBC: 2.83 MIL/uL — ABNORMAL LOW (ref 3.87–5.11)
RDW: 19.6 % — ABNORMAL HIGH (ref 11.5–15.5)
WBC Morphology: INCREASED
WBC: 1.6 10*3/uL — ABNORMAL LOW (ref 4.0–10.5)
nRBC: 0 % (ref 0.0–0.2)

## 2018-05-26 LAB — MAGNESIUM: Magnesium: 2 mg/dL (ref 1.7–2.4)

## 2018-05-26 NOTE — Progress Notes (Signed)
CRITICAL VALUE ALERT  Critical Value:  Platelets 24   Date & Time Notied:  05/26/2018 at 1017  Provider Notified: Dr. Delton Coombes  Orders Received/Actions taken: n/a  Lab results reviewed with pt.  Advised to increased po fluid intake, preferably water, and to use sliding scale insulin as prescribed. She verbalizes understanding.

## 2018-05-28 ENCOUNTER — Inpatient Hospital Stay (HOSPITAL_COMMUNITY): Payer: Commercial Managed Care - PPO

## 2018-05-28 ENCOUNTER — Other Ambulatory Visit (HOSPITAL_COMMUNITY): Payer: Commercial Managed Care - PPO

## 2018-05-28 ENCOUNTER — Inpatient Hospital Stay (HOSPITAL_BASED_OUTPATIENT_CLINIC_OR_DEPARTMENT_OTHER): Payer: Commercial Managed Care - PPO | Admitting: Hematology

## 2018-05-28 ENCOUNTER — Encounter (HOSPITAL_COMMUNITY): Payer: Self-pay | Admitting: Emergency Medicine

## 2018-05-28 ENCOUNTER — Other Ambulatory Visit (HOSPITAL_COMMUNITY): Payer: Self-pay | Admitting: *Deleted

## 2018-05-28 ENCOUNTER — Encounter (HOSPITAL_COMMUNITY): Payer: Self-pay | Admitting: Hematology

## 2018-05-28 ENCOUNTER — Emergency Department (HOSPITAL_COMMUNITY): Payer: Commercial Managed Care - PPO

## 2018-05-28 ENCOUNTER — Inpatient Hospital Stay (HOSPITAL_COMMUNITY)
Admission: EM | Admit: 2018-05-28 | Discharge: 2018-05-30 | DRG: 808 | Disposition: A | Discharge status: Home / Self Care | Payer: Commercial Managed Care - PPO | Source: Ambulatory Visit | Attending: Internal Medicine | Admitting: Internal Medicine

## 2018-05-28 ENCOUNTER — Other Ambulatory Visit: Payer: Self-pay

## 2018-05-28 ENCOUNTER — Encounter (HOSPITAL_COMMUNITY): Payer: Self-pay | Admitting: *Deleted

## 2018-05-28 VITALS — BP 98/61 | HR 109 | Temp 100.4°F | Resp 16

## 2018-05-28 VITALS — BP 103/69 | HR 124 | Temp 98.6°F | Resp 18

## 2018-05-28 DIAGNOSIS — D709 Neutropenia, unspecified: Secondary | ICD-10-CM

## 2018-05-28 DIAGNOSIS — I10 Essential (primary) hypertension: Secondary | ICD-10-CM | POA: Diagnosis present

## 2018-05-28 DIAGNOSIS — Z8249 Family history of ischemic heart disease and other diseases of the circulatory system: Secondary | ICD-10-CM | POA: Diagnosis not present

## 2018-05-28 DIAGNOSIS — E0865 Diabetes mellitus due to underlying condition with hyperglycemia: Secondary | ICD-10-CM | POA: Diagnosis present

## 2018-05-28 DIAGNOSIS — D696 Thrombocytopenia, unspecified: Secondary | ICD-10-CM

## 2018-05-28 DIAGNOSIS — Z79890 Hormone replacement therapy (postmenopausal): Secondary | ICD-10-CM

## 2018-05-28 DIAGNOSIS — Z20828 Contact with and (suspected) exposure to other viral communicable diseases: Secondary | ICD-10-CM | POA: Diagnosis present

## 2018-05-28 DIAGNOSIS — E785 Hyperlipidemia, unspecified: Secondary | ICD-10-CM | POA: Diagnosis present

## 2018-05-28 DIAGNOSIS — D61810 Antineoplastic chemotherapy induced pancytopenia: Secondary | ICD-10-CM | POA: Diagnosis not present

## 2018-05-28 DIAGNOSIS — Z9221 Personal history of antineoplastic chemotherapy: Secondary | ICD-10-CM

## 2018-05-28 DIAGNOSIS — R5081 Fever presenting with conditions classified elsewhere: Secondary | ICD-10-CM | POA: Diagnosis present

## 2018-05-28 DIAGNOSIS — Z85828 Personal history of other malignant neoplasm of skin: Secondary | ICD-10-CM | POA: Diagnosis not present

## 2018-05-28 DIAGNOSIS — Z880 Allergy status to penicillin: Secondary | ICD-10-CM | POA: Diagnosis not present

## 2018-05-28 DIAGNOSIS — Z881 Allergy status to other antibiotic agents status: Secondary | ICD-10-CM

## 2018-05-28 DIAGNOSIS — Z79899 Other long term (current) drug therapy: Secondary | ICD-10-CM

## 2018-05-28 DIAGNOSIS — C8447 Peripheral T-cell lymphoma, not classified, spleen: Secondary | ICD-10-CM

## 2018-05-28 DIAGNOSIS — E43 Unspecified severe protein-calorie malnutrition: Secondary | ICD-10-CM | POA: Diagnosis present

## 2018-05-28 DIAGNOSIS — E1165 Type 2 diabetes mellitus with hyperglycemia: Secondary | ICD-10-CM | POA: Diagnosis present

## 2018-05-28 DIAGNOSIS — D701 Agranulocytosis secondary to cancer chemotherapy: Principal | ICD-10-CM | POA: Diagnosis present

## 2018-05-28 DIAGNOSIS — K589 Irritable bowel syndrome without diarrhea: Secondary | ICD-10-CM | POA: Diagnosis present

## 2018-05-28 DIAGNOSIS — R05 Cough: Secondary | ICD-10-CM

## 2018-05-28 DIAGNOSIS — Z833 Family history of diabetes mellitus: Secondary | ICD-10-CM

## 2018-05-28 DIAGNOSIS — D6481 Anemia due to antineoplastic chemotherapy: Secondary | ICD-10-CM | POA: Diagnosis present

## 2018-05-28 DIAGNOSIS — D61818 Other pancytopenia: Secondary | ICD-10-CM | POA: Diagnosis present

## 2018-05-28 DIAGNOSIS — R1110 Vomiting, unspecified: Secondary | ICD-10-CM | POA: Diagnosis not present

## 2018-05-28 DIAGNOSIS — D6959 Other secondary thrombocytopenia: Secondary | ICD-10-CM | POA: Diagnosis present

## 2018-05-28 DIAGNOSIS — E876 Hypokalemia: Secondary | ICD-10-CM | POA: Diagnosis present

## 2018-05-28 DIAGNOSIS — Z882 Allergy status to sulfonamides status: Secondary | ICD-10-CM

## 2018-05-28 DIAGNOSIS — R059 Cough, unspecified: Secondary | ICD-10-CM

## 2018-05-28 DIAGNOSIS — Z7952 Long term (current) use of systemic steroids: Secondary | ICD-10-CM

## 2018-05-28 DIAGNOSIS — Z681 Body mass index (BMI) 19 or less, adult: Secondary | ICD-10-CM | POA: Diagnosis not present

## 2018-05-28 DIAGNOSIS — T451X5A Adverse effect of antineoplastic and immunosuppressive drugs, initial encounter: Secondary | ICD-10-CM | POA: Diagnosis present

## 2018-05-28 DIAGNOSIS — Z803 Family history of malignant neoplasm of breast: Secondary | ICD-10-CM

## 2018-05-28 DIAGNOSIS — E039 Hypothyroidism, unspecified: Secondary | ICD-10-CM | POA: Diagnosis present

## 2018-05-28 DIAGNOSIS — E860 Dehydration: Secondary | ICD-10-CM

## 2018-05-28 LAB — COMPREHENSIVE METABOLIC PANEL
ALT: 13 U/L (ref 0–44)
AST: 10 U/L — ABNORMAL LOW (ref 15–41)
Albumin: 3 g/dL — ABNORMAL LOW (ref 3.5–5.0)
Alkaline Phosphatase: 59 U/L (ref 38–126)
Anion gap: 11 (ref 5–15)
BUN: 32 mg/dL — ABNORMAL HIGH (ref 6–20)
CO2: 22 mmol/L (ref 22–32)
Calcium: 8.6 mg/dL — ABNORMAL LOW (ref 8.9–10.3)
Chloride: 95 mmol/L — ABNORMAL LOW (ref 98–111)
Creatinine, Ser: 0.93 mg/dL (ref 0.44–1.00)
GFR calc Af Amer: >60 mL/min (ref >60)
GFR calc non Af Amer: >60 mL/min (ref >60)
Glucose, Bld: 456 mg/dL — ABNORMAL HIGH (ref 70–99)
Potassium: 3.6 mmol/L (ref 3.5–5.1)
Sodium: 128 mmol/L — ABNORMAL LOW (ref 135–145)
Total Bilirubin: 0.9 mg/dL (ref 0.3–1.2)
Total Protein: 5.5 g/dL — ABNORMAL LOW (ref 6.5–8.1)

## 2018-05-28 LAB — CBC WITH DIFFERENTIAL/PLATELET
Abs Immature Granulocytes: 0 10*3/uL (ref 0.00–0.07)
Basophils Absolute: 0 10*3/uL (ref 0.0–0.1)
Basophils Relative: 0 %
Eosinophils Absolute: 0 10*3/uL (ref 0.0–0.5)
Eosinophils Relative: 0 %
HCT: 25 % — ABNORMAL LOW (ref 36.0–46.0)
Hemoglobin: 8.6 g/dL — ABNORMAL LOW (ref 12.0–15.0)
Immature Granulocytes: 0 %
Lymphocytes Relative: 72 %
Lymphs Abs: 0.1 10*3/uL — ABNORMAL LOW (ref 0.7–4.0)
MCH: 34.7 pg — ABNORMAL HIGH (ref 26.0–34.0)
MCHC: 34.4 g/dL (ref 30.0–36.0)
MCV: 100.8 fL — ABNORMAL HIGH (ref 80.0–100.0)
Monocytes Absolute: 0 10*3/uL — ABNORMAL LOW (ref 0.1–1.0)
Monocytes Relative: 6 %
Neutro Abs: 0 10*3/uL — ABNORMAL LOW (ref 1.7–7.7)
Neutrophils Relative %: 22 %
Platelets: 8 10*3/uL — CL (ref 150–400)
RBC: 2.48 MIL/uL — ABNORMAL LOW (ref 3.87–5.11)
RDW: 18.3 % — ABNORMAL HIGH (ref 11.5–15.5)
WBC: 0.2 10*3/uL — CL (ref 4.0–10.5)
nRBC: 0 % (ref 0.0–0.2)

## 2018-05-28 LAB — SAMPLE TO BLOOD BANK

## 2018-05-28 LAB — PROCALCITONIN: Procalcitonin: 0.46 ng/mL

## 2018-05-28 LAB — MAGNESIUM: Magnesium: 1.6 mg/dL — ABNORMAL LOW (ref 1.7–2.4)

## 2018-05-28 LAB — SARS CORONAVIRUS 2 BY RT PCR (HOSPITAL ORDER, PERFORMED IN ~~LOC~~ HOSPITAL LAB): SARS Coronavirus 2: NEGATIVE

## 2018-05-28 LAB — LACTIC ACID, PLASMA
Lactic Acid, Venous: 1.8 mmol/L (ref 0.5–1.9)
Lactic Acid, Venous: 2 mmol/L (ref 0.5–1.9)

## 2018-05-28 MED ORDER — VANCOMYCIN HCL IN DEXTROSE 1-5 GM/200ML-% IV SOLN
1000.0000 mg | Freq: Once | INTRAVENOUS | Status: AC
  Administered 2018-05-28: 1000 mg via INTRAVENOUS
  Filled 2018-05-28: qty 200

## 2018-05-28 MED ORDER — HEPARIN SOD (PORK) LOCK FLUSH 100 UNIT/ML IV SOLN: 500.0000 [IU] | Freq: Every day | INTRAVENOUS | Status: AC | PRN

## 2018-05-28 MED ORDER — LIDOCAINE-PRILOCAINE 2.5-2.5 % EX CREA: 1.0000 "application " | TOPICAL_CREAM | CUTANEOUS | Status: DC | PRN

## 2018-05-28 MED ORDER — VALACYCLOVIR HCL 500 MG PO TABS
500.0000 mg | ORAL_TABLET | Freq: Every day | ORAL | Status: DC
  Administered 2018-05-29: 500 mg via ORAL
  Filled 2018-05-28 (×2): qty 1

## 2018-05-28 MED ORDER — SODIUM CHLORIDE 0.9 % IV SOLN
INTRAVENOUS | Status: DC
  Administered 2018-05-28 – 2018-05-30 (×2): via INTRAVENOUS

## 2018-05-28 MED ORDER — SODIUM CHLORIDE 0.9% FLUSH: 10.0000 mL | INTRAVENOUS | Status: AC | PRN

## 2018-05-28 MED ORDER — ACETAMINOPHEN 325 MG PO TABS
650.0000 mg | ORAL_TABLET | Freq: Once | ORAL | Status: AC
  Administered 2018-05-28: 650 mg via ORAL

## 2018-05-28 MED ORDER — METFORMIN HCL 500 MG PO TABS: 1000.0000 mg | ORAL_TABLET | Freq: Two times a day (BID) | ORAL | Status: DC

## 2018-05-28 MED ORDER — DIPHENHYDRAMINE HCL 25 MG PO CAPS
25.0000 mg | ORAL_CAPSULE | Freq: Once | ORAL | Status: AC
  Administered 2018-05-28: 25 mg via ORAL

## 2018-05-28 MED ORDER — ONDANSETRON HCL 4 MG/2ML IJ SOLN
8.0000 mg | Freq: Once | INTRAMUSCULAR | Status: AC
  Administered 2018-05-28: 8 mg via INTRAVENOUS

## 2018-05-28 MED ORDER — SODIUM CHLORIDE 0.9% IV SOLUTION
250.0000 mL | Freq: Once | INTRAVENOUS | Status: AC
  Administered 2018-05-28: 15:00:00 250 mL via INTRAVENOUS

## 2018-05-28 MED ORDER — VANCOMYCIN HCL IN DEXTROSE 750-5 MG/150ML-% IV SOLN
750.0000 mg | INTRAVENOUS | Status: DC
  Administered 2018-05-29: 750 mg via INTRAVENOUS
  Filled 2018-05-28: qty 150

## 2018-05-28 MED ORDER — LEVOTHYROXINE SODIUM 75 MCG PO TABS
75.0000 ug | ORAL_TABLET | Freq: Every day | ORAL | Status: DC
  Administered 2018-05-29 – 2018-05-30 (×2): 75 ug via ORAL
  Filled 2018-05-28 (×2): qty 1

## 2018-05-28 MED ORDER — INSULIN ASPART 100 UNIT/ML ~~LOC~~ SOLN
0.0000 [IU] | Freq: Three times a day (TID) | SUBCUTANEOUS | Status: DC
  Administered 2018-05-29: 2 [IU] via SUBCUTANEOUS
  Administered 2018-05-29 (×2): 11 [IU] via SUBCUTANEOUS

## 2018-05-28 MED ORDER — SODIUM CHLORIDE 0.9 % IV SOLN
Freq: Once | INTRAVENOUS | Status: AC
  Administered 2018-05-28: 14:00:00 via INTRAVENOUS
  Filled 2018-05-28: qty 1000

## 2018-05-28 MED ORDER — ONDANSETRON HCL 4 MG/2ML IJ SOLN
INTRAMUSCULAR | Status: AC
  Filled 2018-05-28: qty 4

## 2018-05-28 MED ORDER — DIPHENHYDRAMINE HCL 25 MG PO CAPS
ORAL_CAPSULE | ORAL | Status: AC
  Filled 2018-05-28: qty 1

## 2018-05-28 MED ORDER — INSULIN ASPART 100 UNIT/ML ~~LOC~~ SOLN: 0.0000 [IU] | Freq: Every day | SUBCUTANEOUS | Status: DC

## 2018-05-28 MED ORDER — ACETAMINOPHEN 500 MG PO TABS
ORAL_TABLET | ORAL | Status: AC
  Filled 2018-05-28: qty 2

## 2018-05-28 MED ORDER — SODIUM CHLORIDE 0.9 % IV SOLN
1.0000 g | Freq: Three times a day (TID) | INTRAVENOUS | Status: DC
  Administered 2018-05-28: 18:00:00 1 g via INTRAVENOUS
  Filled 2018-05-28 (×3): qty 1

## 2018-05-28 MED ORDER — ACETAMINOPHEN 500 MG PO TABS
1000.0000 mg | ORAL_TABLET | Freq: Once | ORAL | Status: AC
  Administered 2018-05-28: 1000 mg via ORAL
  Filled 2018-05-28: qty 2

## 2018-05-28 MED ORDER — LOPERAMIDE HCL 2 MG PO CAPS: 2.0000 mg | ORAL_CAPSULE | ORAL | Status: DC | PRN

## 2018-05-28 MED ORDER — SODIUM CHLORIDE 0.9 % IV SOLN
2.0000 g | Freq: Three times a day (TID) | INTRAVENOUS | Status: DC
  Administered 2018-05-28: 1 g via INTRAVENOUS
  Administered 2018-05-29 – 2018-05-30 (×3): 2 g via INTRAVENOUS
  Filled 2018-05-28 (×12): qty 2

## 2018-05-28 MED ORDER — SODIUM CHLORIDE 0.9% IV SOLUTION
Freq: Once | INTRAVENOUS | Status: AC
  Administered 2018-05-28: 21:00:00 via INTRAVENOUS

## 2018-05-28 MED ORDER — PROCHLORPERAZINE MALEATE 5 MG PO TABS: 10.0000 mg | ORAL_TABLET | Freq: Four times a day (QID) | ORAL | Status: DC | PRN

## 2018-05-28 MED ORDER — ZOLPIDEM TARTRATE 5 MG PO TABS
5.0000 mg | ORAL_TABLET | Freq: Every evening | ORAL | Status: DC | PRN
  Administered 2018-05-29 (×2): 5 mg via ORAL
  Filled 2018-05-28 (×2): qty 1

## 2018-05-28 MED ORDER — ATORVASTATIN CALCIUM 20 MG PO TABS
20.0000 mg | ORAL_TABLET | Freq: Every morning | ORAL | Status: DC
  Administered 2018-05-29 – 2018-05-30 (×2): 20 mg via ORAL
  Filled 2018-05-28 (×2): qty 1

## 2018-05-28 MED ORDER — AMOXICILLIN-POT CLAVULANATE 875-125 MG PO TABS: 1.0000 | ORAL_TABLET | Freq: Two times a day (BID) | ORAL | 0 refills | Status: DC

## 2018-05-28 NOTE — Progress Notes (Signed)
1150: Patient called clinic c/o chills, weak, no energy, vomiting.    I spoke with Dr. Delton Coombes, orders for labs, blood cultures and visit with him today.    Patient notified and she is on her way.

## 2018-05-28 NOTE — ED Notes (Signed)
ED TO INPATIENT HANDOFF REPORT  ED Nurse Name and Phone #: Rosalie Gums, RN S Name/Age/Gender Collene Gobble 58 y.o. female Room/Bed: APA03/APA03  Code Status   Code Status: Prior  Home/SNF/Other Home Patient oriented to: self, place, time and situation Is this baseline? Yes   Triage Complete: Triage complete  Chief Complaint Fever  Triage Note Pt brought over from cancer center. 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 Pt receives chemo at [facility], last treatment was Friday. Pt c/o fever, nausea, and productive cough x 2-3 days. Denies SOB. Cancer center stated they wanted to direct admit to ICU, but pt needed Covid testing and chest xray. EDP aware. Pt given 1 g vanc, 1 unit platelets, 8 mg Zofran, and 1 bolus of NS with 20 potassium and 2g mag. Pt came down with port accessed and infusing with bolus and and IV in LT FA.    Allergies Allergies  Allergen Reactions  . Doxycycline Anaphylaxis  . Penicillins Anaphylaxis    Did it involve swelling of the face/tongue/throat, SOB, or low BP? Yes-SOB Did it involve sudden or severe rash/hives, skin peeling, or any reaction on the inside of your mouth or nose? Unknown Did you need to seek medical attention at a hospital or doctor's office? Yes When did it last happen?Over 10 years ago If all above answers are "NO", may proceed with cephalosporin use.   . Levofloxacin Nausea And Vomiting  . Sulfa Antibiotics Itching and Rash    Level of Care/Admitting Diagnosis ED Disposition    ED Disposition Condition [facility] Area: [facility] [[id number]]  Level of Care: Med-Surg [16]  Covid Evaluation: N/A  Diagnosis: Neutropenic fever [facility]) [[id number]]  Admitting Physician: Truett Mainland [[id number]]  Attending Physician: Truett Mainland [[id number]]  Estimated length of stay: past midnight tomorrow  Certification:: I certify this patient will need inpatient services for at least 2  midnights  PT Class (Do Not Modify): Inpatient [101]  PT Acc Code (Do Not Modify): Private [1]       B Medical/Surgery History Past Medical History:  Diagnosis Date  . Diabetes ([place]) 01/20/2018  . HTN (hypertension)   . Hypothyroidism   . 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 COLONOSCOPY  07/27/2010   Procedure: COLONOSCOPY;  Surgeon: Dorothyann Peng, MD;  Location: [facility] ENDO SU[facility];  Service: Endoscopy;  Laterality: N/A;  . infertility surgery    . MOHS SURGERY     basal cell carcinoma  . PORTACATH PLACEMENT Left 04/01/2018   Procedure: INSERTION PORT-A-CATH;  Surgeon: Aviva Signs, MD;  Location: [facility] ORS;  Service: General;  Laterality: Left;  . REDUCTION MAMMAPLASTY Bilateral 2002     A IV Location/Drains/Wounds Patient Lines/Drains/Airways Status   Active Line/Drains/Airways    Name:   Placement date:   Placement time:   Site:   Days:   Implanted Port 04/01/18 Left Chest   04/01/18    -    Chest   57   Peripheral IV 05/28/18 Left Forearm   05/28/18    1500    Forearm   less than 1   Incision (Closed) 01/21/18 Back Right   01/21/18    0930     127   Incision (Closed) 04/01/18 Chest Left   04/01/18    1112     57  Intake/Output Last 24 hours  Intake/Output Summary (Last 24 hours) at 05/28/2018 1956 Last data filed at 05/28/2018 1934 Gross per 24 hour  Intake 100 ml  Output -  Net 100 ml    Labs/Imaging Results for orders placed or performed during the hospital encounter of 05/28/18 (from the past 48 hour(s))  SARS Coronavirus 2 (CEPHEID- Performed in [place] hospital lab), Hosp Order     Status: None   Collection Time: 05/28/18  5:26 PM  Result Value Ref Range   SARS Coronavirus 2 NEGATIVE NEGATIVE    Comment: (NOTE) If result is NEGATIVE SARS-CoV-2 target nucleic acids are NOT DETECTED. The SARS-CoV-2 RNA is generally detectable in upper and lower  respiratory specimens during the acute  phase of infection. The lowest  concentration of SARS-CoV-2 viral copies this assay can detect is 250  copies / mL. A negative result does not preclude SARS-CoV-2 infection  and should not be used as the sole basis for treatment or other  patient management decisions.  A negative result may occur with  improper specimen collection / handling, submission of specimen other  than nasopharyngeal swab, presence of viral mutation(s) within the  areas targeted by this assay, and inadequate number of viral copies  (<250 copies / mL). A negative result must be combined with clinical  observations, patient history, and epidemiological information. If result is POSITIVE SARS-CoV-2 target nucleic acids are DETECTED. The SARS-CoV-2 RNA is generally detectable in upper and lower  respiratory specimens dur ing the acute phase of infection.  Positive  results are indicative of active infection with SARS-CoV-2.  Clinical  correlation with patient history and other diagnostic information is  necessary to determine patient infection status.  Positive results do  not rule out bacterial infection or co-infection with other viruses. If result is PRESUMPTIVE POSTIVE SARS-CoV-2 nucleic acids MAY BE PRESENT.   A presumptive positive result was obtained on the submitted specimen  and confirmed on repeat testing.  While 2019 novel coronavirus  (SARS-CoV-2) nucleic acids may be present in the submitted sample  additional confirmatory testing may be necessary for epidemiological  and / or clinical management purposes  to differentiate between  SARS-CoV-2 and other Sarbecovirus currently known to infect humans.  If clinically indicated additional testing with an alternate test  methodology [phone]) is advised. The SARS-CoV-2 RNA is generally  detectable in upper and lower respiratory sp ecimens during the acute  phase of infection. The expected result is Negative. Fact Sheet for Patients:   StrictlyIdeas.no Fact Sheet for Healthcare Providers: BankingDealers.co.za This test is not yet approved or cleared by the [place] FDA and has been authorized for detection and/or diagnosis of SARS-CoV-2 by FDA under an Emergency Use Authorization (EUA).  This EUA will remain in effect (meaning this test can be used) for the duration of the COVID-19 declaration under Section 564(b)(1) of the Act, 21 U.S.C. section 360bbb-3(b)(1), unless the authorization is terminated or revoked sooner. Performed at [facility], [address], [place], [place] [address]   Lactic acid, plasma     Status: None   Collection Time: 05/28/18  6:35 PM  Result Value Ref Range   Lactic Acid, Venous 1.8 0.5 - 1.9 mmol/L    Comment: Performed at [facility], [address], [place], [place] [address]  Procalcitonin - Baseline     Status: None   Collection Time: 05/28/18  6:35 PM  Result Value Ref Range   Procalcitonin 0.46 ng/mL    Comment:  Interpretation: PCT (Procalcitonin) <= 0.5 ng/mL: Systemic infection (sepsis) is not likely. Local bacterial infection is possible. (NOTE)       Sepsis PCT Algorithm           Lower Respiratory Tract                                      Infection PCT Algorithm    ----------------------------     ----------------------------         PCT < 0.25 ng/mL                PCT < 0.10 ng/mL         Strongly encourage             Strongly discourage   discontinuation of antibiotics    initiation of antibiotics    ----------------------------     -----------------------------       PCT 0.25 - 0.50 ng/mL            PCT 0.10 - 0.25 ng/mL               OR       >80% decrease in PCT            Discourage initiation of                                            antibiotics      Encourage discontinuation           of antibiotics    ----------------------------     -----------------------------         PCT >= 0.50 ng/mL               PCT 0.26 - 0.50 ng/mL               AND        <80% decrease in PCT             Encourage initiation of                                             antibiotics       Encourage continuation           of antibiotics    ----------------------------     -----------------------------        PCT >= 0.50 ng/mL                  PCT > 0.50 ng/mL               AND         increase in PCT                  Strongly encourage                                      initiation of antibiotics    Strongly encourage escalation           of antibiotics                                     -----------------------------  PCT <= 0.25 ng/mL                                                 OR                                        > 80% decrease in PCT                                     Discontinue / Do not initiate                                             antibiotics Performed at [facility], [address], [place], [place] [address]    Dg Chest Portable 1 View  Result Date: 05/28/2018 CLINICAL DATA:  Neutropenic patient with productive cough and fever for 2-3 days. EXAM: PORTABLE CHEST 1 VIEW COMPARISON:  Single-view of the chest 04/01/2018. PA and lateral chest 03/18/2018. FINDINGS: Lungs are clear. Heart size is normal. Port-A-Cath noted. No pneumothorax or pleural fluid. No bony abnormality. IMPRESSION: Negative chest. Electronically Signed   By: Inge Rise M.D.   On: 05/28/2018 17:51    Pending Labs Unresulted Labs (From admission, onward)    Start     Ordered   05/29/18 0500  Procalcitonin  Daily,   R     05/28/18 1815   05/29/18 0500  Creatinine, serum  Every Mon-Wed-Fri (0500),   R     05/28/18 1919   05/28/18 1815  Lactic acid, plasma  Now then every 2 hours,   STAT     05/28/18 1815   05/28/18 1728  Urine culture  ONCE - STAT,   STAT     05/28/18 1727   05/28/18 1728  Urinalysis, Routine w reflex microscopic  Once,   R     05/28/18 1727    Signed and Held  CBC  Tomorrow morning,   R     Signed and Held   Signed and Held  Basic metabolic panel  Tomorrow morning,   R     Signed and Held   Signed and Held  Prepare Pheresed Platelets  (Adult Blood Administration - Platelets (Pheresed))  Once,   R    Question Answer Comment  Number of Apheresis Units 1 unit (6-10 packs)   Transfusion Indications PLT Count </=10,000/mm      Signed and Held   Signed and Held  Culture, blood (x 2)  BLOOD CULTURE X 2,   STAT    Comments:  INITIATE ANTIBIOTICS WITHIN 1 HOUR AFTER BLOOD CULTURES DRAWN. If unable to obtain blood cultures, call MD immediately regarding antibiotic instructions.    Signed and Held   Signed and Held  MRSA PCR Screening  ONCE - STAT,   R     Signed and Held          Vitals/Pain Today's Vitals   05/28/18 1722 05/28/18 1800 05/28/18 1933  BP: 105/66 109/70 (!) 140/98  Pulse: (!) 105 (!) 106 (!) 115  Resp: 16 19 (!) 22  Temp:  98.3 F (36.8 C)  100.1 F (37.8 C)  TempSrc: Oral  Oral  SpO2: 99% 97% 100%  Weight: 51.1 kg    Height: 5\' 3"  (1.6 m)    PainSc: 0-No pain      Isolation Precautions Droplet and Contact precautions  Medications Medications  0.9 %  sodium chloride infusion (has no administration in time range)  vancomycin (VANCOCIN) IVPB 750 mg/150 ml premix (has no administration in time range)  aztreonam (AZACTAM) 2 g in sodium chloride 0.9 % 100 mL IVPB (1 g Intravenous New Bag/Given 05/28/18 1941)    Mobility walks Low fall risk   Focused Assessments    R Recommendations: See Admitting Provider Note  Report given to:   Additional Notes:

## 2018-05-28 NOTE — Assessment & Plan Note (Signed)
1.  PTCL, NOS: -6 cycles of CHOEP from 01/28/2018 through 05/20/2018. - She apparently had vomited twice this morning and had temperature of 99.1 at home.  She felt very weak.  She was busy at [facility] doing pretransplant work-up yesterday. -We did her blood work today.  White count is low at 0.2.  Platelets are low at 8.  Hemoglobin is 8.6.  Her temperature on presentation was 98.6. -We did send blood cultures today.  She does not report any urinary symptoms.  However she had some cough for the last 2 weeks with expectoration and greenish sputum in the morning which turns into clear sputum.  I would order a chest x-ray today. -She did receive 1 L of fluid without lites.  She also will receive 1 g of vancomycin.  We will start her on Augmentin 875 mg twice daily.  She was offered admission but she declined. -She will also receive 1 unit of irradiated platelets. she is willing to come back tomorrow for fluids and checkup.    2.  Hypomagnesemia: -Magnesium is 1.4.  She will receive magnesium 2 g today. -She will continue magnesium 403 times a day.

## 2018-05-28 NOTE — Progress Notes (Addendum)
Called lab to follow up re: platelet products, pending response from one blood at this time.

## 2018-05-28 NOTE — Progress Notes (Signed)
Pharmacy Antibiotic Note  Kristin Good is a 58 y.o. female admitted on 05/28/2018 with febrile neurtropenia.  Pharmacy has been consulted for Vancomycin and Aztreona dosing.  Plan: Vancomycin 750 mg IV every 24 hours.  Goal trough 15-20 mcg/mL.  Aztreonam 2000 mg IV every 8 hours. Monitor labs, c/s, and vanco levels as indicated.  Height: 5\' 3"  (160 cm) Weight: 112 lb 9.6 oz (51.1 kg) IBW/kg (Calculated) : 52.4  Temp (24hrs), Avg:99.2 F (37.3 C), Min:98.3 F (36.8 C), Max:100.4 F (38 C)  Recent Labs  Lab 05/26/18 0951 05/28/18 1320 05/28/18 1835  WBC 1.6* 0.2*  --   CREATININE 0.97 0.93  --   LATICACIDVEN  --   --  1.8    Estimated Creatinine Clearance: 52.5 mL/min (by C-G formula based on SCr of 0.93 mg/dL).    Allergies  Allergen Reactions  . Doxycycline Anaphylaxis  . Penicillins Anaphylaxis    Did it involve swelling of the face/tongue/throat, SOB, or low BP? Yes-SOB Did it involve sudden or severe rash/hives, skin peeling, or any reaction on the inside of your mouth or nose? Unknown Did you need to seek medical attention at a hospital or doctor's office? Yes When did it last happen?Over 10 years ago If all above answers are "NO", may proceed with cephalosporin use.   . Levofloxacin Nausea And Vomiting  . Sulfa Antibiotics Itching and Rash    Antimicrobials this admission: Vanco 5/14 >>  Aztreonam 5/14 >>   Dose adjustments this admission: N/A  Microbiology results: 5/14 UCx: pending    Thank you for allowing pharmacy to be a part of this patient's care.  Ramond Craver 05/28/2018 7:27 PM

## 2018-05-28 NOTE — Patient Instructions (Signed)
[facility]  Discharge Instructions:   _______________________________________________________________  Thank you for choosing [facility] at [facility] to provide your oncology and hematology care.  To afford each patient quality time with our providers, please arrive at least 15 minutes before your scheduled appointment.  You need to re-schedule your appointment if you arrive 10 or more minutes late.  We strive to give you quality time with our providers, and arriving late affects you and other patients whose appointments are after yours.  Also, if you no show three or more times for appointments you may be dismissed from the clinic.  Again, thank you for choosing [facility] at [facility]. Our hope is that these requests will allow you access to exceptional care and in a timely manner. _______________________________________________________________  If you have questions after your visit, please contact our office at [phone] between the hours of 8:30 a.m. and 5:00 p.m. Voicemails left after 4:30 p.m. will not be returned until the following business day. _______________________________________________________________  For prescription refill requests, have your pharmacy contact our office. _______________________________________________________________  Recommendations made by the consultant and any test results will be sent to your referring physician. _______________________________________________________________ 

## 2018-05-28 NOTE — Progress Notes (Unsigned)
CRITICAL VALUE ALERT  Critical Value:  WBC 0.2; platelets 8  Date & Time Notied:  05/28/2018 at 1353  Provider Notified: Dr. Delton Coombes  Orders Received/Actions taken: transfuse 1 unit platelets

## 2018-05-28 NOTE — Progress Notes (Unsigned)
Patient came in today not feeling well, nauseated, vomiting x 1 in the past 24 hours. Last treatment was last Friday.  IVF ordered with labs and blood cultures. Labs and cultures done in outpt. Lab.   1530-MD in to see patient , will give Vancomycin per orders, one unit of platelets.  1600 temp is 99.4, MD aware.  Platelets done at 1655. Temp 100.4 MD aware. Plans to admit patient.   1700-Report called to Gearldine Shown RN in the emergency department.Infusions given per orders. Patient tolerated it well without problems. Vitals stable and discharged to the emergency department from clinic via wheelchair. All belongings placed in a hospital bag and with patient at transport.

## 2018-05-28 NOTE — ED Triage Notes (Signed)
Pt brought over from cancer center. 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 Pt receives chemo at [facility], last treatment was Friday. Pt c/o fever, nausea, and productive cough x 2-3 days. Denies SOB. Cancer center stated they wanted to direct admit to ICU, but pt needed Covid testing and chest xray. EDP aware. Pt given 1 g vanc, 1 unit platelets, 8 mg Zofran, and 1 bolus of NS with 20 potassium and 2g mag. Pt came down with port accessed and infusing with bolus and and IV in LT FA.

## 2018-05-28 NOTE — H&P (Addendum)
History and Physical  Kristin Good VQM:[id number] DOB: May 07, 1960 DOA: 05/28/2018  Referring physician: Dr Wilson Singer, ED physician PCP: Marinda Elk, MD  Outpatient Specialists: Dr Delton Coombes  Patient Coming From: home  Chief Complaint: fever  HPI: Kristin Good is a 58 y.o. female with a history of atypical T-cell lymphoma, hypothyroidism, hypertension, diabetes.  Patient seen for "fever" or 99.1 that started this morning.  Patient called her oncologist, who had the patient come in.  He ran blood work, had obtain blood cultures, give her vancomycin and IV fluids.  Patient was tachycardic in the cancer office.  Patient has had a cough for about a week and a half that was initially productive with green sputum, but is improved to clear sputum.  Cough is loose.  She does complain of chills intermittently and an episode of nausea with vomiting this morning.  No abdominal pain.  Emergency Department Course: Chest x-ray normal.  Lactic acid normal.  Review of Systems:   Pt denies any nausea, vomiting, diarrhea, constipation, abdominal pain, shortness of breath, dyspnea on exertion, orthopnea, wheezing, palpitations, headache, vision changes, lightheadedness, dizziness, melena, rectal bleeding.  Review of systems are otherwise negative  Past Medical History:  Diagnosis Date  . Diabetes ([place]) 01/20/2018  . HTN (hypertension)   . Hypothyroidism   . Leukemia ([place])    Atypical T cell lymphoma   Past Surgical History:  Procedure Laterality Date  . COLONOSCOPY  07/27/2010   Procedure: COLONOSCOPY;  Surgeon: Dorothyann Peng, MD;  Location: [facility];  Service: Endoscopy;  Laterality: N/A;  . infertility surgery    . MOHS SURGERY     basal cell carcinoma  . PORTACATH PLACEMENT Left 04/01/2018   Procedure: INSERTION PORT-A-CATH;  Surgeon: Aviva Signs, MD;  Location: [facility] ORS;  Service: General;  Laterality: Left;  . REDUCTION MAMMAPLASTY Bilateral 2002   Social History:  reports that  she has never smoked. She has never used smokeless tobacco. She reports current alcohol use. She reports that she does not use drugs. Patient lives at home  Allergies  Allergen Reactions  . Doxycycline Anaphylaxis  . Penicillins Anaphylaxis    Did it involve swelling of the face/tongue/throat, SOB, or low BP? Yes-SOB Did it involve sudden or severe rash/hives, skin peeling, or any reaction on the inside of your mouth or nose? Unknown Did you need to seek medical attention at a hospital or doctor's office? Yes When did it last happen?Over 10 years ago If all above answers are "NO", may proceed with cephalosporin use.   . Levofloxacin Nausea And Vomiting  . Sulfa Antibiotics Itching and Rash    Family History  Problem Relation Age of Onset  . Heart disease Mother   . Diabetes Father   . Breast cancer Sister 78  . Breast cancer Maternal Aunt   . Colon cancer Neg Hx   . Liver disease Neg Hx       Prior to Admission medications   Medication Sig Start Date End Date Taking? Authorizing Provider  amoxicillin-clavulanate (AUGMENTIN) 875-125 MG tablet Take 1 tablet by mouth 2 (two) times daily. 05/28/18   Derek Jack, MD  atorvastatin (LIPITOR) 20 MG tablet Take 20 mg by mouth every morning.  04/17/17   [provider]  calcium-vitamin D (CALCIUM 500+D HIGH POTENCY) 500-400 MG-UNIT tablet Take 1 tablet by mouth 2 (two) times daily.    [provider]  cyclophosphamide in sodium chloride 0.9 % 250 mL Inject 1,260 mg into the vein  every 21 ( twenty-one) days.    [provider]  DOXOrubicin HCl (ADRIAMYCIN IV) Inject 84 mg into the vein every 21 ( twenty-one) days.    [provider]  ETOPOSIDE IV Inject 170 mg into the vein every 21 ( twenty-one) days. Days 1-3    [provider]  furosemide (LASIX) 20 MG tablet Take 1 tablet (20 mg total) by mouth daily as needed. 01/28/18   Derek Jack, MD  Glucosamine-Chondroit-Vit C-Mn  (GLUCOSAMINE 1500 COMPLEX PO) Take 1 capsule by mouth 2 (two) times daily.    [provider]  levothyroxine (SYNTHROID, LEVOTHROID) 75 MCG tablet Take 75 mcg by mouth daily before breakfast.  10/17/17   [provider]  lidocaine-prilocaine (EMLA) cream Apply 1 application topically as needed. 04/01/18   Derek Jack, MD  loperamide (IMODIUM) 2 MG capsule Take 2 mg by mouth as needed for diarrhea or loose stools.    [provider]  magic mouthwash SOLN Take 5 mLs by mouth 3 (three) times daily as needed for mouth pain. With lidocaine 1% 1:1 solution 02/16/18   Lockamy, Randi L, NP-C  magnesium oxide (MAG-OX) 400 (241.3 Mg) MG tablet TAKE 1 TABLET BY MOUTH TWICE DAILY 05/21/18   Lockamy, Randi L, NP-C  metFORMIN (GLUCOPHAGE) 1000 MG tablet Take 1,000 mg by mouth 2 (two) times daily with a meal.  10/17/17   [provider]  pegfilgrastim (NEULASTA) 6 MG/0.6ML injection Inject 6 mg into the skin See admin instructions. To be administered 3 days after chemo therapy treatment    [provider]  predniSONE (DELTASONE) 20 MG tablet TAKE 5 TABLETS BY MOUTH ONCE DAILY ON DAYS 1-5 OF CHEMOTHERAPY. 05/01/18   Derek Jack, MD  prochlorperazine (COMPAZINE) 10 MG tablet Take 1 tablet (10 mg total) by mouth every 6 (six) hours as needed for nausea or vomiting. 01/28/18   Derek Jack, MD  triamterene-hydrochlorothiazide (MAXZIDE-25) 37.5-25 MG per tablet Take 1 tablet by mouth daily.      [provider]  valACYclovir (VALTREX) 500 MG tablet TAKE 1 TABLET BY MOUTH ONCE DAILY. 04/30/18   Derek Jack, MD  vinCRIStine 2 mg in sodium chloride 0.9 % 50 mL Inject 2 mg into the vein every 21 ( twenty-one) days.    [provider]  vitamin B-12 (CYANOCOBALAMIN) 1000 MCG tablet Take 1 tablet (1,000 mcg total) by mouth daily. 01/24/18   Caren Griffins, MD  vitamin C (ASCORBIC ACID) 500 MG tablet Take 500 mg by mouth 2 (two) times daily.     [provider]  zolpidem (AMBIEN) 10 MG tablet Take 1 tablet (10 mg total) by mouth at bedtime as needed for sleep. 05/18/18   Derek Jack, MD    Physical Exam: BP 105/66 (BP Location: Left Arm)   Pulse (!) 105   Temp 98.3 F (36.8 C) (Oral)   Resp 16   Ht 5\' 3"  (1.6 m)   Wt 51.1 kg   SpO2 99%   BMI 19.95 kg/m   . General: Middle-aged Caucasian female. Awake and alert and oriented x3. No acute cardiopulmonary distress.  Marland Kitchen HEENT: Normocephalic atraumatic.  Right and left ears normal in appearance.  Pupils equal, round, reactive to light. Extraocular muscles are intact. Sclerae anicteric and noninjected.  Moist mucosal membranes. No mucosal lesions.  . Neck: Neck supple without lymphadenopathy. No carotid bruits. No masses palpated.  . Cardiovascular: Regular rate with normal S1-S2 sounds. No murmurs, rubs, gallops auscultated. No JVD.  Marland Kitchen Respiratory: Good  respiratory effort with occasional rhonchi.  No wheezes, rales. Lungs clear to auscultation bilaterally.  No accessory muscle use. . Abdomen: Soft, nontender, nondistended. Active bowel sounds. No masses or hepatosplenomegaly  . Skin: No rashes, lesions, or ulcerations.  Dry, warm to touch. 2+ dorsalis pedis and radial pulses. . Musculoskeletal: No calf or leg pain. All major joints not erythematous nontender.  No upper or lower joint deformation.  Good ROM.  No contractures  . Psychiatric: Intact judgment and insight. Pleasant and cooperative. . Neurologic: No focal neurological deficits. Strength is 5/5 and symmetric in upper and lower extremities.  Cranial nerves II through XII are grossly intact.           Labs on Admission: I have personally reviewed following labs and imaging studies  CBC: Recent Labs  Lab 05/26/18 0951 05/28/18 1320  WBC 1.6* 0.2*  NEUTROABS 0.9* 0.0*  HGB 9.8* 8.6*  HCT 30.0* 25.0*  MCV 106.0* 100.8*  PLT 24* 8*   Basic Metabolic Panel: Recent Labs  Lab 05/26/18 0951 05/28/18  1320  NA 138 128*  K 4.9 3.6  CL 107 95*  CO2 21* 22  GLUCOSE 451* 456*  BUN 40* 32*  CREATININE 0.97 0.93  CALCIUM 9.2 8.6*  MG 2.0 1.6*   GFR: Estimated Creatinine Clearance: 52.5 mL/min (by C-G formula based on SCr of 0.93 mg/dL). Liver Function Tests: Recent Labs  Lab 05/26/18 0951 05/28/18 1320  AST 13* 10*  ALT 17 13  ALKPHOS 74 59  BILITOT 0.8 0.9  PROT 5.6* 5.5*  ALBUMIN 3.4* 3.0*   No results for input(s): LIPASE, AMYLASE in the last 168 hours. No results for input(s): AMMONIA in the last 168 hours. Coagulation Profile: No results for input(s): INR, PROTIME in the last 168 hours. Cardiac Enzymes: No results for input(s): CKTOTAL, CKMB, CKMBINDEX, TROPONINI in the last 168 hours. BNP (last 3 results) No results for input(s): PROBNP in the last 8760 hours. HbA1C: No results for input(s): HGBA1C in the last 72 hours. CBG: No results for input(s): GLUCAP in the last 168 hours. Lipid Profile: No results for input(s): CHOL, HDL, LDLCALC, TRIG, CHOLHDL, LDLDIRECT in the last 72 hours. Thyroid Function Tests: No results for input(s): TSH, T4TOTAL, FREET4, T3FREE, THYROIDAB in the last 72 hours. Anemia Panel: No results for input(s): VITAMINB12, FOLATE, FERRITIN, TIBC, IRON, RETICCTPCT in the last 72 hours. Urine analysis:    Component Value Date/Time   COLORURINE YELLOW 02/07/2018 2345   APPEARANCEUR HAZY (A) 02/07/2018 2345   LABSPEC 1.015 02/07/2018 2345   PHURINE 6.0 02/07/2018 2345   GLUCOSEU NEGATIVE 02/07/2018 2345   HGBUR SMALL (A) 02/07/2018 2345   BILIRUBINUR NEGATIVE 02/07/2018 [place] 02/07/2018 2345   PROTEINUR 100 (A) 02/07/2018 2345   NITRITE NEGATIVE 02/07/2018 2345   LEUKOCYTESUR NEGATIVE 02/07/2018 2345   Sepsis Labs: @LABRCNTIP (procalcitonin:4,lacticidven:4) ) Recent Results (from the past 240 hour(s))  Culture, blood (routine x 2)     Status: None (Preliminary result)   Collection Time: 05/28/18  1:19 PM  Result  Value Ref Range Status   Specimen Description BLOOD RIGHT ANTECUBITAL  Final   Special Requests   Final    BOTTLES DRAWN AEROBIC AND ANAEROBIC Blood Culture adequate volume Performed at [facility], [address] [place], [place] [address]    Culture PENDING  Incomplete   Report Status PENDING  Incomplete  Culture, blood (Routine X 2) w Reflex to ID Panel     Status: None (Preliminary result)  Collection Time: 05/28/18  1:20 PM  Result Value Ref Range Status   Specimen Description BLOOD LEFT ANTECUBITAL  Final   Special Requests   Final    BOTTLES DRAWN AEROBIC AND ANAEROBIC Blood Culture adequate volume Performed at [facility], [address] [place], [place] [address]    Culture PENDING  Incomplete   Report Status PENDING  Incomplete  SARS Coronavirus 2 (CEPHEID- Performed in [place] hospital lab), Hosp Order     Status: None   Collection Time: 05/28/18  5:26 PM  Result Value Ref Range Status   SARS Coronavirus 2 NEGATIVE NEGATIVE Final    Comment: (NOTE) If result is NEGATIVE SARS-CoV-2 target nucleic acids are NOT DETECTED. The SARS-CoV-2 RNA is generally detectable in upper and lower  respiratory specimens during the acute phase of infection. The lowest  concentration of SARS-CoV-2 viral copies this assay can detect is 250  copies / mL. A negative result does not preclude SARS-CoV-2 infection  and should not be used as the sole basis for treatment or other  patient management decisions.  A negative result may occur with  improper specimen collection / handling, submission of specimen other  than nasopharyngeal swab, presence of viral mutation(s) within the  areas targeted by this assay, and inadequate number of viral copies  (<250 copies / mL). A negative result must be combined with clinical  observations, patient history, and epidemiological information. If result is POSITIVE SARS-CoV-2 target nucleic acids are DETECTED. The SARS-CoV-2 RNA is generally detectable  in upper and lower  respiratory specimens dur ing the acute phase of infection.  Positive  results are indicative of active infection with SARS-CoV-2.  Clinical  correlation with patient history and other diagnostic information is  necessary to determine patient infection status.  Positive results do  not rule out bacterial infection or co-infection with other viruses. If result is PRESUMPTIVE POSTIVE SARS-CoV-2 nucleic acids MAY BE PRESENT.   A presumptive positive result was obtained on the submitted specimen  and confirmed on repeat testing.  While 2019 novel coronavirus  (SARS-CoV-2) nucleic acids may be present in the submitted sample  additional confirmatory testing may be necessary for epidemiological  and / or clinical management purposes  to differentiate between  SARS-CoV-2 and other Sarbecovirus currently known to infect humans.  If clinically indicated additional testing with an alternate test  methodology [phone]) is advised. The SARS-CoV-2 RNA is generally  detectable in upper and lower respiratory sp ecimens during the acute  phase of infection. The expected result is Negative. Fact Sheet for Patients:  StrictlyIdeas.no Fact Sheet for Healthcare Providers: BankingDealers.co.za This test is not yet approved or cleared by the [place] FDA and has been authorized for detection and/or diagnosis of SARS-CoV-2 by FDA under an Emergency Use Authorization (EUA).  This EUA will remain in effect (meaning this test can be used) for the duration of the COVID-19 declaration under Section 564(b)(1) of the Act, 21 U.S.C. section 360bbb-3(b)(1), unless the authorization is terminated or revoked sooner. Performed at [facility], [address], [place], [place] [address]      Radiological Exams on Admission: Dg Chest Portable 1 View  Result Date: 05/28/2018 CLINICAL DATA:  Neutropenic patient with productive cough and fever for  2-3 days. EXAM: PORTABLE CHEST 1 VIEW COMPARISON:  Single-view of the chest 04/01/2018. PA and lateral chest 03/18/2018. FINDINGS: Lungs are clear. Heart size is normal. Port-A-Cath noted. No pneumothorax or pleural fluid. No bony abnormality. IMPRESSION: Negative chest. Electronically Signed  By: Inge Rise M.D.   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  We also discussed care given by the ED provider.  1. Neutropenic fever a. Admit b. Blood cultures c. Vanco and aztreonam d. No source currently e. Check CBC tomorrow f. RespVirus panel 2. IBS a. Continue home meds 3. Pancytopenia a. Patient received 1 unit of platelets and oncology.  Will transfuse another unit of platelets. b. Check CBC tomorrow 4. Essential hypertension a. Continue home meds 5. T cell lymphoma a. Currently on chemo  DVT prophylaxis: SCDs Consultants: None Code Status: Full code Family Communication: None Disposition Plan: Home following evaluation   Truett Mainland, DO

## 2018-05-28 NOTE — Progress Notes (Signed)
Kristin Good, [place] [id number]   CLINIC:  Medical Oncology/Hematology  PCP:  Marinda Elk, MD [place] [address] [phone]   REASON FOR VISIT:  Follow-up for PTC L.   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[place])    01/28/2018 -  Chemotherapy    The patient had DOXOrubicin (ADRIAMYCIN) chemo injection 50 mg, 30 mg/m2 = 50 mg (60 % of original dose 50 mg/m2), Intravenous,  Once, 6 of 8 cycles Dose modification: 30 mg/m2 (60 % of original dose 50 mg/m2, Cycle 1, Reason: Other (see comments), Comment: elevated bilirubin) Administration: 50 mg (01/28/2018), 76 mg (03/11/2018), 76 mg (02/18/2018), 76 mg (04/01/2018), 76 mg (04/21/2018), 76 mg (05/20/2018) palonosetron (ALOXI) injection 0.25 mg, 0.25 mg, Intravenous,  Once, 6 of 8 cycles Administration: 0.25 mg (01/28/2018), 0.25 mg (02/18/2018), 0.25 mg (03/11/2018), 0.25 mg (04/01/2018), 0.25 mg (04/21/2018), 0.25 mg (05/20/2018) pegfilgrastim (NEULASTA ONPRO KIT) injection 6 mg, 6 mg, Subcutaneous, Once, 6 of 8 cycles Administration: 6 mg (02/20/2018), 6 mg (04/03/2018), 6 mg (04/23/2018), 6 mg (05/22/2018) vinCRIStine (ONCOVIN) 1 mg in sodium chloride 0.9 % 50 mL chemo infusion, 1 mg (50 % of original dose 2 mg), Intravenous,  Once, 6 of 8 cycles Dose modification: 1 mg (50 % of original dose 2 mg, Cycle 1, Reason: Other (see comments), Comment: liver dysfunctyion) Administration: 1 mg (01/28/2018), 2 mg (03/11/2018), 2 mg (02/18/2018), 2 mg (04/01/2018), 2 mg (04/21/2018), 2 mg (05/20/2018) cyclophosphamide (CYTOXAN) 1,260 mg in sodium chloride 0.9 % 250 mL chemo infusion, 750 mg/m2 = 1,260 mg, Intravenous,  Once, 6 of 8 cycles Administration: 1,260 mg (01/28/2018), 1,140 mg (03/11/2018), 1,140 mg (02/18/2018), 1,140 mg (04/01/2018), 1,140 mg (04/21/2018), 1,140 mg (05/20/2018) etoposide  (VEPESID) 130 mg in sodium chloride 0.9 % 500 mL chemo infusion, 75 mg/m2 = 130 mg (75 % of original dose 100 mg/m2), Intravenous,  Once, 6 of 8 cycles Dose modification: 75 mg/m2 (75 % of original dose 100 mg/m2, Cycle 1, Reason: Change in LFTs), 75 mg/m2 (75 % of original dose 100 mg/m2, Cycle 1, Reason: Change in LFTs) Administration: 130 mg (01/28/2018), 130 mg (01/29/2018), 130 mg (01/30/2018), 150 mg (02/19/2018), 150 mg (02/20/2018), 150 mg (03/12/2018), 150 mg (03/13/2018), 150 mg (04/02/2018), 150 mg (04/03/2018), 150 mg (04/22/2018), 150 mg (04/23/2018), 150 mg (05/21/2018), 150 mg (05/22/2018), 150 mg (03/11/2018), 150 mg (02/18/2018), 150 mg (04/01/2018), 150 mg (04/21/2018), 150 mg (05/20/2018) fosaprepitant (EMEND) 150 mg, dexamethasone (DECADRON) 12 mg in sodium chloride 0.9 % 145 mL IVPB, , Intravenous,  Once, 6 of 6 cycles Administration:  (01/28/2018),  (02/18/2018),  (03/11/2018),  (04/01/2018),  (04/21/2018),  (05/20/2018)  for chemotherapy treatment.       CANCER STAGING: Cancer Staging No matching staging information was found for the patient.   INTERVAL HISTORY:  Kristin Good 58 y.o. female returns for unscheduled visit today.  She felt nauseous while eating cereal this morning and threw up 1-2 times.  She went to [facility] yesterday for transplant work-up and felt tired.  She also thought she had a mild temperature at home of 99.1.  Taste buds have changed.  She complained of fatigue and weakness.  No major bleeding reported.  She also had mild chills.  When she came to our clinic her temperature is 98.6.  She does report cough over the  last 2 weeks.  She reports cough in the mornings with greenish expectoration which turns into clear during the daytime.  Appetite and energy are reported at 25%.     REVIEW OF SYSTEMS:  Review of Systems  Constitutional: Negative for fatigue.  Respiratory: Positive for cough.   Gastrointestinal: Positive for nausea and vomiting.  All other systems reviewed and are  negative.    PAST MEDICAL/SURGICAL HISTORY:  Past Medical History:  Diagnosis Date  . Diabetes ([place]) 01/20/2018  . HTN (hypertension)   . Hypothyroidism   . 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 COLONOSCOPY  07/27/2010   Procedure: COLONOSCOPY;  Surgeon: Dorothyann Peng, MD;  Location: [facility] ENDO SUITE;  Service: Endoscopy;  Laterality: N/A;  . infertility surgery    . MOHS SURGERY     basal cell carcinoma  . PORTACATH PLACEMENT Left 04/01/2018   Procedure: INSERTION PORT-A-CATH;  Surgeon: Aviva Signs, MD;  Location: [facility] ORS;  Service: General;  Laterality: Left;  . REDUCTION MAMMAPLASTY Bilateral 2002     SOCIAL HISTORY:  Social History   Socioeconomic History  . Marital status: Married    Spouse name: Not on file  . Number of children: 0  . Years of education: Not on file  . Highest education level: Not on file  Occupational History  . Occupation: [employer]    Comment: Nurse  Social Needs  . Financial resource strain: Not on file  . Food insecurity:    Worry: Not on file    Inability: Not on file  . Transportation needs:    Medical: Not on file    Non-medical: Not on file  Tobacco Use  . Smoking status: Never Smoker  . Smokeless tobacco: Never Used  Substance and Sexual Activity  . Alcohol use: Yes    Comment: once or twice a year  . Drug use: No  . Sexual activity: Not on file  Lifestyle  . Physical activity:    Days per week: Not on file    Minutes per session: Not on file  . Stress: Not on file  Relationships  . Social connections:    Talks on phone: Not on file    Gets together: Not on file    Attends religious service: Not on file    Active member of club or organization: Not on file    Attends meetings of clubs or organizations: Not on file    Relationship status: Not on file  . Intimate partner violence:    Fear of current or ex partner: Not on file    Emotionally abused:  Not on file    Physically abused: Not on file    Forced sexual activity: Not on file  Other Topics Concern  . Not on file  Social History Narrative  . Not on file    FAMILY HISTORY:  Family History  Problem Relation Age of Onset  . Heart disease Mother   . Diabetes Father   . Breast cancer Sister 64  . Breast cancer Maternal Aunt   . Colon cancer Neg Hx   . Liver disease Neg Hx     CURRENT MEDICATIONS:  Outpatient Encounter Medications as of 05/28/2018  Medication Sig Note  . atorvastatin (LIPITOR) 20 MG tablet Take 20 mg by mouth every morning.    . calcium-vitamin D (CALCIUM 500+D HIGH POTENCY) 500-400 MG-UNIT tablet Take 1 tablet by mouth 2 (  two) times daily. 03/23/2018: On hold  . cyclophosphamide in sodium chloride 0.9 % 250 mL Inject 1,260 mg into the vein every 21 ( twenty-one) days.   Marland Kitchen DOXOrubicin HCl (ADRIAMYCIN IV) Inject 84 mg into the vein every 21 ( twenty-one) days.   . ETOPOSIDE IV Inject 170 mg into the vein every 21 ( twenty-one) days. Days 1-3   . furosemide (LASIX) 20 MG tablet Take 1 tablet (20 mg total) by mouth daily as needed.   . Glucosamine-Chondroit-Vit C-Mn (GLUCOSAMINE 1500 COMPLEX PO) Take 1 capsule by mouth 2 (two) times daily. 03/23/2018: On hold  . levothyroxine (SYNTHROID, LEVOTHROID) 75 MCG tablet Take 75 mcg by mouth daily before breakfast.    . lidocaine-prilocaine (EMLA) cream Apply 1 application topically as needed.   . loperamide (IMODIUM) 2 MG capsule Take 2 mg by mouth as needed for diarrhea or loose stools.   . magic mouthwash SOLN Take 5 mLs by mouth 3 (three) times daily as needed for mouth pain. With lidocaine 1% 1:1 solution   . magnesium oxide (MAG-OX) 400 (241.3 Mg) MG tablet TAKE 1 TABLET BY MOUTH TWICE DAILY   . metFORMIN (GLUCOPHAGE) 1000 MG tablet Take 1,000 mg by mouth 2 (two) times daily with a meal.    . pegfilgrastim (NEULASTA) 6 MG/0.6ML injection Inject 6 mg into the skin See admin instructions. To be administered 3 days after  chemo therapy treatment   . predniSONE (DELTASONE) 20 MG tablet TAKE 5 TABLETS BY MOUTH ONCE DAILY ON DAYS 1-5 OF CHEMOTHERAPY.   Marland Kitchen prochlorperazine (COMPAZINE) 10 MG tablet Take 1 tablet (10 mg total) by mouth every 6 (six) hours as needed for nausea or vomiting.   . triamterene-hydrochlorothiazide (MAXZIDE-25) 37.5-25 MG per tablet Take 1 tablet by mouth daily.   03/23/2018: On hold  . valACYclovir (VALTREX) 500 MG tablet TAKE 1 TABLET BY MOUTH ONCE DAILY.   . vinCRIStine 2 mg in sodium chloride 0.9 % 50 mL Inject 2 mg into the vein every 21 ( twenty-one) days.   . vitamin B-12 (CYANOCOBALAMIN) 1000 MCG tablet Take 1 tablet (1,000 mcg total) by mouth daily.   . vitamin C (ASCORBIC ACID) 500 MG tablet Take 500 mg by mouth 2 (two) times daily. 03/23/2018: On hold  . zolpidem (AMBIEN) 10 MG tablet Take 1 tablet (10 mg total) by mouth at bedtime as needed for sleep.   Marland Kitchen amoxicillin-clavulanate (AUGMENTIN) 875-125 MG tablet Take 1 tablet by mouth 2 (two) times daily.   . [DISCONTINUED] allopurinol (ZYLOPRIM) 300 MG tablet Take 1 tablet (300 mg total) by mouth daily.   . [DISCONTINUED] ciprofloxacin (CIPRO) 500 MG tablet TAKE 1 TABLET BY MOUTH TWICE DAILY   . [DISCONTINUED] fluconazole (DIFLUCAN) 100 MG tablet TAKE 1 TABLET BY MOUTH ONCE DAILY.    Facility-Administered Encounter Medications as of 05/28/2018  Medication  . Chlorhexidine Gluconate Cloth 2 % PADS 6 each   And  . Chlorhexidine Gluconate Cloth 2 % PADS 6 each  . diphenhydrAMINE (BENADRYL) injection 25 mg  . sodium chloride flush (NS) 0.9 % injection 10 mL  . vancomycin (VANCOCIN) IVPB 1000 mg/200 mL premix    ALLERGIES:  Allergies  Allergen Reactions  . Doxycycline Anaphylaxis  . Penicillins Anaphylaxis    Did it involve swelling of the face/tongue/throat, SOB, or low BP? Yes-SOB Did it involve sudden or severe rash/hives, skin peeling, or any reaction on the inside of your mouth or nose? Unknown Did you need to seek medical  attention at a hospital  or doctor's office? Yes When did it last happen?Over 10 years ago If all above answers are "NO", may proceed with cephalosporin use.   . Levofloxacin Nausea And Vomiting  . Sulfa Antibiotics Itching and Rash     PHYSICAL EXAM:  ECOG Performance status: 1  Vitals:   05/28/18 1327  BP: 103/69  Pulse: (!) 124  Resp: 18  Temp: 98.6 F (37 C)  SpO2: 100%   There were no vitals filed for this visit.  Physical Exam Vitals signs reviewed.  Constitutional:      Appearance: Normal appearance.  Cardiovascular:     Rate and Rhythm: Normal rate and regular rhythm.     Heart sounds: Normal heart sounds.  Pulmonary:     Effort: Pulmonary effort is normal.     Breath sounds: Normal breath sounds.  Abdominal:     General: There is no distension.     Palpations: Abdomen is soft. There is no mass.  Musculoskeletal:        General: No swelling.  Skin:    General: Skin is warm.  Neurological:     General: No focal deficit present.     Mental Status: She is alert and oriented to person, place, and time.  Psychiatric:        Mood and Affect: Mood normal.        Behavior: Behavior normal.      LABORATORY DATA:  I have reviewed the labs as listed.  CBC    Component Value Date/Time   WBC 0.2 (LL) 05/28/2018 1320   RBC 2.48 (L) 05/28/2018 1320   HGB 8.6 (L) 05/28/2018 1320   HCT 25.0 (L) 05/28/2018 1320   HCT 21.1 (L) 01/19/2018 0747   PLT 8 (LL) 05/28/2018 1320   MCV 100.8 (H) 05/28/2018 1320   MCH 34.7 (H) 05/28/2018 1320   MCHC 34.4 05/28/2018 1320   RDW 18.3 (H) 05/28/2018 1320   LYMPHSABS 0.1 (L) 05/28/2018 1320   MONOABS 0.0 (L) 05/28/2018 1320   EOSABS 0.0 05/28/2018 1320   BASOSABS 0.0 05/28/2018 1320   CMP Latest Ref Rng & Units 05/28/2018 05/26/2018 05/20/2018  Glucose 70 - 99 mg/dL 456(H) 451(H) 304(H)  BUN 6 - 20 mg/dL 32(H) 40(H) 46(H)  Creatinine 0.44 - 1.00 mg/dL 0.93 0.97 1.03(H)  Sodium 135 - 145 mmol/L 128(L) 138 137   Potassium 3.5 - 5.1 mmol/L 3.6 4.9 4.5  Chloride 98 - 111 mmol/L 95(L) 107 101  CO2 22 - 32 mmol/L 22 21(L) 21(L)  Calcium 8.9 - 10.3 mg/dL 8.6(L) 9.2 9.9  Total Protein 6.5 - 8.1 g/dL 5.5(L) 5.6(L) 6.1(L)  Total Bilirubin 0.3 - 1.2 mg/dL 0.9 0.8 0.8  Alkaline Phos 38 - 126 U/L 59 74 84  AST 15 - 41 U/L 10(L) 13(L) 20  ALT 0 - 44 U/L 13 17 36       DIAGNOSTIC IMAGING:  I have independently reviewed the scans and discussed with the patient.    ASSESSMENT & PLAN:   Lymphoma, peripheral T-cell, spleen ([facility]) 1.  PTCL, NOS: -6 cycles of CHOEP from 01/28/2018 through 05/20/2018. - She apparently had vomited twice this morning and had temperature of 99.1 at home.  She felt very weak.  She was busy at [facility] doing pretransplant work-up yesterday. -We did her blood work today.  White count is low at 0.2.  Platelets are low at 8.  Hemoglobin is 8.6.  Her temperature on presentation was 98.6. -We did send blood cultures today.  She  does not report any urinary symptoms.  However she had some cough for the last 2 weeks with expectoration and greenish sputum in the morning which turns into clear sputum.  I would order a chest x-ray today. -She did receive 1 L of fluid without lites.  She also will receive 1 g of vancomycin.  We will start her on Augmentin 875 mg twice daily.  She was offered admission but she declined. -She will also receive 1 unit of irradiated platelets. she is willing to come back tomorrow for fluids and checkup.    2.  Hypomagnesemia: -Magnesium is 1.4.  She will receive magnesium 2 g today. -She will continue magnesium 403 times a day.      Total time spent is 25 minutes with more than 50% of the time spent face-to-face discussing treatment plan, follow-up scans and coordination of care.    Orders placed this encounter:  Orders Placed This Encounter  Procedures  . Culture, blood (Routine X 2) w Reflex to ID Panel  . DG Chest 2 View      Derek Jack, [place] [phone]

## 2018-05-29 ENCOUNTER — Ambulatory Visit (HOSPITAL_COMMUNITY): Payer: Commercial Managed Care - PPO

## 2018-05-29 DIAGNOSIS — D61818 Other pancytopenia: Secondary | ICD-10-CM

## 2018-05-29 DIAGNOSIS — E0865 Diabetes mellitus due to underlying condition with hyperglycemia: Secondary | ICD-10-CM

## 2018-05-29 DIAGNOSIS — C8447 Peripheral T-cell lymphoma, not classified, spleen: Secondary | ICD-10-CM

## 2018-05-29 DIAGNOSIS — E860 Dehydration: Secondary | ICD-10-CM

## 2018-05-29 LAB — BPAM PLATELET PHERESIS
Blood Product Expiration Date: 202005162359
ISSUE DATE / TIME: 202005141532
Unit Type and Rh: 6200

## 2018-05-29 LAB — PREPARE PLATELET PHERESIS: Unit division: 0

## 2018-05-29 LAB — CBC
HCT: 20.1 % — ABNORMAL LOW (ref 36.0–46.0)
Hemoglobin: 6.9 g/dL — CL (ref 12.0–15.0)
MCH: 35.2 pg — ABNORMAL HIGH (ref 26.0–34.0)
MCHC: 34.3 g/dL (ref 30.0–36.0)
MCV: 102.6 fL — ABNORMAL HIGH (ref 80.0–100.0)
Platelets: 43 10*3/uL — ABNORMAL LOW (ref 150–400)
RBC: 1.96 MIL/uL — ABNORMAL LOW (ref 3.87–5.11)
RDW: 18.2 % — ABNORMAL HIGH (ref 11.5–15.5)
WBC: 0.2 10*3/uL — CL (ref 4.0–10.5)
nRBC: 0 % (ref 0.0–0.2)

## 2018-05-29 LAB — RESPIRATORY PANEL BY PCR

## 2018-05-29 LAB — URINALYSIS, ROUTINE W REFLEX MICROSCOPIC
Bacteria, UA: NONE SEEN
Bilirubin Urine: NEGATIVE
Glucose, UA: >=500 mg/dL — AB
Hgb urine dipstick: NEGATIVE
Ketones, ur: NEGATIVE mg/dL
Leukocytes,Ua: NEGATIVE
Nitrite: NEGATIVE
Protein, ur: 30 mg/dL — AB
Specific Gravity, Urine: 1.013 (ref 1.005–1.030)
pH: 5 (ref 5.0–8.0)

## 2018-05-29 LAB — BASIC METABOLIC PANEL
Anion gap: 8 (ref 5–15)
BUN: 30 mg/dL — ABNORMAL HIGH (ref 6–20)
CO2: 21 mmol/L — ABNORMAL LOW (ref 22–32)
Calcium: 8.3 mg/dL — ABNORMAL LOW (ref 8.9–10.3)
Chloride: 107 mmol/L (ref 98–111)
Creatinine, Ser: 0.84 mg/dL (ref 0.44–1.00)
GFR calc Af Amer: >60 mL/min (ref >60)
GFR calc non Af Amer: >60 mL/min (ref >60)
Glucose, Bld: 354 mg/dL — ABNORMAL HIGH (ref 70–99)
Potassium: 3.5 mmol/L (ref 3.5–5.1)
Sodium: 136 mmol/L (ref 135–145)

## 2018-05-29 LAB — C DIFFICILE QUICK SCREEN W PCR REFLEX
C Diff antigen: NEGATIVE
C Diff interpretation: NOT DETECTED
C Diff toxin: NEGATIVE

## 2018-05-29 LAB — DIFFERENTIAL
Basophils Absolute: 0 10*3/uL (ref 0.0–0.1)
Basophils Relative: 0 %
Eosinophils Absolute: 0 10*3/uL (ref 0.0–0.5)
Eosinophils Relative: 0 %
Lymphocytes Relative: 75 %
Lymphs Abs: 0.1 10*3/uL — ABNORMAL LOW (ref 0.7–4.0)
Monocytes Absolute: 0 10*3/uL — ABNORMAL LOW (ref 0.1–1.0)
Monocytes Relative: 13 %
Neutro Abs: 0 10*3/uL — ABNORMAL LOW (ref 1.7–7.7)
Neutrophils Relative %: 6 %

## 2018-05-29 LAB — PREPARE RBC (CROSSMATCH)

## 2018-05-29 LAB — MRSA PCR SCREENING: MRSA by PCR: NEGATIVE

## 2018-05-29 LAB — HEMOGLOBIN A1C
Hgb A1c MFr Bld: 8.5 % — ABNORMAL HIGH (ref 4.8–5.6)
Mean Plasma Glucose: 197.25 mg/dL

## 2018-05-29 LAB — GLUCOSE, CAPILLARY
Glucose-Capillary: 308 mg/dL — ABNORMAL HIGH (ref 70–99)
Glucose-Capillary: 318 mg/dL — ABNORMAL HIGH (ref 70–99)
Glucose-Capillary: 141 mg/dL — ABNORMAL HIGH (ref 70–99)
Glucose-Capillary: 170 mg/dL — ABNORMAL HIGH (ref 70–99)

## 2018-05-29 LAB — MAGNESIUM: Magnesium: 1.8 mg/dL (ref 1.7–2.4)

## 2018-05-29 LAB — PROCALCITONIN: Procalcitonin: 11.41 ng/mL

## 2018-05-29 MED ORDER — MAGNESIUM SULFATE 2 GM/50ML IV SOLN
2.0000 g | Freq: Once | INTRAVENOUS | Status: AC
  Administered 2018-05-29: 2 g via INTRAVENOUS
  Filled 2018-05-29: qty 50

## 2018-05-29 MED ORDER — DIPHENHYDRAMINE HCL 25 MG PO CAPS
25.0000 mg | ORAL_CAPSULE | Freq: Once | ORAL | Status: AC
  Administered 2018-05-29: 25 mg via ORAL
  Filled 2018-05-29: qty 1

## 2018-05-29 MED ORDER — INSULIN ASPART 100 UNIT/ML ~~LOC~~ SOLN
3.0000 [IU] | Freq: Three times a day (TID) | SUBCUTANEOUS | Status: DC
  Administered 2018-05-29: 18:00:00 3 [IU] via SUBCUTANEOUS

## 2018-05-29 MED ORDER — SODIUM CHLORIDE 0.9% IV SOLUTION
Freq: Once | INTRAVENOUS | Status: AC
  Administered 2018-05-29: 12:00:00 via INTRAVENOUS

## 2018-05-29 MED ORDER — GLUCERNA SHAKE PO LIQD
237.0000 mL | Freq: Three times a day (TID) | ORAL | Status: DC
  Administered 2018-05-29: 16:00:00 237 mL via ORAL

## 2018-05-29 MED ORDER — INSULIN GLARGINE 100 UNIT/ML ~~LOC~~ SOLN
15.0000 [IU] | Freq: Every day | SUBCUTANEOUS | Status: DC
  Administered 2018-05-29: 15 [IU] via SUBCUTANEOUS
  Filled 2018-05-29 (×4): qty 0.15

## 2018-05-29 MED ORDER — ACETAMINOPHEN 325 MG PO TABS
650.0000 mg | ORAL_TABLET | Freq: Once | ORAL | Status: AC
  Administered 2018-05-29: 650 mg via ORAL
  Filled 2018-05-29: qty 2

## 2018-05-29 MED ORDER — POTASSIUM CHLORIDE CRYS ER 20 MEQ PO TBCR
40.0000 meq | EXTENDED_RELEASE_TABLET | Freq: Once | ORAL | Status: AC
  Administered 2018-05-29: 40 meq via ORAL
  Filled 2018-05-29: qty 2

## 2018-05-29 MED ORDER — ACETAMINOPHEN 325 MG PO TABS
650.0000 mg | ORAL_TABLET | Freq: Four times a day (QID) | ORAL | Status: DC | PRN
  Administered 2018-05-30: 650 mg via ORAL
  Filled 2018-05-29: qty 2

## 2018-05-29 NOTE — Progress Notes (Addendum)
Initial Nutrition Assessment  DOCUMENTATION CODES:  Severe malnutrition in context of chronic illness  INTERVENTION:  Glucerna Shake po TID, each supplement provides 220 kcal and 10 grams of protein  Nutrition recommendations provided  NUTRITION DIAGNOSIS:  Severe Malnutrition related to cancer and cancer related treatments as evidenced by severe muscle/fat loss and a loss of >10% bw in <6 months.  GOAL:  Patient will meet greater than or equal to 90% of their needs  MONITOR:  PO intake, Supplement acceptance, Labs, I & O's  REASON FOR ASSESSMENT:  Consult Assessment of nutrition requirement/status  ASSESSMENT:  57 y/o female w/ PMHx T-Cell lymphoma (dx in 01/27/18), HTN, DM2. Recently completed last chemo cycle 5/7. Was referred to [facility] for BMT, however pts functional status felt too poor at this time. Now presents to hospital w/ fever. Found to be pancytopenic and admitted for neutropenic fever.   Noted in chart that pt has been working to increase her functional status by pushing caloric intake and exercising.   On interview, pt states she has adapted a very regimented lifestyle. She has been actively trying to gain weight. She is making sure she eats something every other hour. She is drinking 2 Premier Protein supplements a day. She is working on increasing her functional status w/ help of her husband. He will drop the patient off from the car and then drives exactly one mile away and waits for her. They do this nearly every day.   Pt says her UBW is 135-140 lbs and she is now 112.6. She says her weight loss began slighlt before her admission in January. This report is corroborated by chart history. Per chart, pt was ~125 lbs at time of her diagnosis this past January. Within the first month afterwards, she had lost down to 112-117 lbs. However, her weight has been largely stable in this range since that time.  She has lost ~9-10% of her BW in ~5 months. It appears her wt loss  predated her dx by several months, she was ~140 lbs at this time last year. She has lost ~18% of her bw x1 year.   She is currently undergoing exhaustive workup procedures for BMT at [facility]. It doesn't sound this will likely occur for atleast another month or so.    RD offered some nutritional recommendations. Noted that, while premier protein is great for supplementing protein, it is not ideal for wt gain given its low caloric content. RD recommended glucerna at this time and if her BGs come under control, would recommend Ensure Enlive. Noted an 8oz [employer] contains 3x the calories of an 11 oz premier protein.   At this time, she says she isnt eating well because she does not care for the hospital food. However, outside of the hospital, she says she has a very good appetite. She denies any n/v/c/d.   Labs: BGs 300-450. Albumin: 3.0, A1c on 4/21: 7.7 Meds: Insulin, IV abx, IVF  Recent Labs  Lab 05/26/18 0951 05/28/18 1320 05/29/18 0534 05/29/18 0830  NA 138 128* 136  --   K 4.9 3.6 3.5  --   CL 107 95* 107  --   CO2 21* 22 21*  --   BUN 40* 32* 30*  --   CREATININE 0.97 0.93 0.84  --   CALCIUM 9.2 8.6* 8.3*  --   MG 2.0 1.6*  --  1.8  GLUCOSE 451* 456* 354*  --    NUTRITION - FOCUSED PHYSICAL EXAM:   Most  Recent Value  Orbital Region  Moderate depletion  Upper Arm Region  Severe depletion  Thoracic and Lumbar Region  Severe depletion  Buccal Region  Mild depletion  Temple Region  Severe depletion  Clavicle Bone Region  Severe depletion  Clavicle and Acromion Bone Region  Severe depletion  Scapular Bone Region  Unable to assess  Dorsal Hand  Mild depletion  Patellar Region  No depletion  Anterior Thigh Region  Severe depletion  Posterior Calf Region  Severe depletion  Edema (RD Assessment)  None  Hair  Reviewed  Eyes  Reviewed  Mouth  Reviewed  Skin  Reviewed  Nails  Reviewed     Diet Order:   Diet Order            Diet regular Room service appropriate? Yes;  Fluid consistency: Thin  Diet effective now             EDUCATION NEEDS:  No education needs have been identified at this time  Skin:  Skin Assessment: Reviewed RN Assessment  Last BM:  5/14  Height:  Ht Readings from Last 1 Encounters:  05/28/18 5\' 3"  (1.6 m)   Weight:  Wt Readings from Last 1 Encounters:  05/28/18 52.5 kg   Wt Readings from Last 10 Encounters:  05/28/18 52.5 kg  05/22/18 50.9 kg  05/21/18 51.3 kg  05/13/18 53.1 kg  04/21/18 50.9 kg  04/02/18 55.3 kg  04/01/18 53.5 kg  04/01/18 55.4 kg  03/30/18 54.9 kg  03/19/18 51.5 kg   Ideal Body Weight:  52.27 kg  BMI:  Body mass index is 20.5 kg/m.  Estimated Nutritional Needs:  Kcal:  1750-1900 (33-36 kcal/kg bw) Protein:  79-95g Pro (1.5-1.8g/kg bw) Fluid:  >1.6 L fluid ( 91ml/kcal)  Burtis Junes RD, LDN, CNSC Clinical Nutrition Available Tues-Sat via Pager: [phone] 05/29/2018 3:07 PM

## 2018-05-29 NOTE — Progress Notes (Signed)
Inpatient Diabetes Program Recommendations  AACE/ADA: New Consensus Statement on Inpatient Glycemic Control (2015)  Target Ranges:  Prepandial:   less than 140 mg/dL      Peak postprandial:   less than 180 mg/dL (1-2 hours)      Critically ill patients:  140 - 180 mg/dL   Results for Kristin Good, Kristin Good (MRN [id number]) as of 05/29/2018 10:04  Ref. Range 05/29/2018 08:21  Glucose-Capillary Latest Ref Range: 70 - 99 mg/dL 308 (H)  11 units NOVOLOG given at 10am   Results for Kristin Good, Kristin Good (MRN [id number]) as of 05/29/2018 10:04  Ref. Range 01/19/2018 04:20  Hemoglobin A1C Latest Ref Range: 4.8 - 5.6 % 5.7 (H)    Admit with: Neutropenic Fever/ Pancytopenia/ N&V with Dehydration/ Hyperglycemia  History: DM, T Cell Lymphoma  Home DM Meds: Metformin 1000 mg BID  Current Orders: Lantus 15 units daily      Novolog Moderate Correction Scale/ SSI (0-15 units) TID AC + HS      Novolog 3 units TID with meals      MD- Note Lantus and Novolog started this AM.  Severely hyperglycemic on admission.  Current A1c in process--Note that last A1c was 5.7% back in January  Concerned A1c may be elevated as patient has been getting Decadron at her Chemotherapy treatments:  Received 10 mg Decadron on the following dates: 05/06, 05/07, 05/08.  No recommendations at this time since insulins just started this AM.     --Will follow patient during hospitalization--  Wyn Quaker RN, MSN, CDE Diabetes Coordinator Inpatient Glycemic Control Team Team Pager: [phone] (8a-5p)

## 2018-05-29 NOTE — Progress Notes (Signed)
Has had 3 large liquid brown watery stools.

## 2018-05-29 NOTE — Consult Note (Signed)
[facility] Consultation Oncology  Name: Kristin Good      MRN: [id number]    Location: N829/F621-30  Date: 05/29/2018 Time:4:36 PM   REFERRING PHYSICIAN: Dr. Clementeen Graham  REASON FOR CONSULT: Neutropenic fever.   DIAGNOSIS: Severe neutropenia and fever in the setting of chemotherapy.  HISTORY OF PRESENT ILLNESS: She is a 58 year old very pleasant white female who is seen in consultation today for further management of neutropenic fever in the setting of recent chemotherapy.  She was diagnosed with peripheral T-cell lymphoma when she presented with severe pancytopenia and splenomegaly.  She was started on chemotherapy with CHOEP regimen on January 28, 2018.  She received her last cycle on 05/20/2018.  She presented to the office yesterday with vomiting and mild temperature of 99.1.  She was also feeling very weak.  Blood count showed white count of 0.2 and platelet count of 8000.  While we were giving her fluids in our office, her temperature started going up.  We have done blood cultures and she received 1 g of vancomycin.  At that point we have decided her to be admitted to the hospital.  She was sent to the ER last night where she was negative for COVID screen.  However her temperature increased to 102.8 last night at 8:40pm.  This morning she had a T-max of 100.6 around 8 AM.  She is feeling better.  Her hemoglobin dropped to 6.9.  White count was still remaining 0.2.  Platelet count improved to 43 after transfusion.  Chest x-ray was negative for any infiltrates.   PAST MEDICAL HISTORY:   Past Medical History:  Diagnosis Date  . Diabetes ([place]) 01/20/2018  . HTN (hypertension)   . Hypothyroidism   . Leukemia ([place])    Atypical T cell lymphoma    ALLERGIES: Allergies  Allergen Reactions  . Doxycycline Anaphylaxis  . Penicillins Anaphylaxis    Did it involve swelling of the face/tongue/throat, SOB, or low BP? Yes-SOB Did it involve sudden or severe rash/hives, skin peeling, or any  reaction on the inside of your mouth or nose? Unknown Did you need to seek medical attention at a hospital or doctor's office? Yes When did it last happen?Over 10 years ago If all above answers are "NO", may proceed with cephalosporin use.   . Levofloxacin Nausea And Vomiting  . Sulfa Antibiotics Itching and Rash      MEDICATIONS: I have reviewed the patient's current medications.     PAST SURGICAL HISTORY Past Surgical History:  Procedure Laterality Date  . COLONOSCOPY  07/27/2010   Procedure: COLONOSCOPY;  Surgeon: Dorothyann Peng, MD;  Location: [facility] ENDO SUITE;  Service: Endoscopy;  Laterality: N/A;  . infertility surgery    . MOHS SURGERY     basal cell carcinoma  . PORTACATH PLACEMENT Left 04/01/2018   Procedure: INSERTION PORT-A-CATH;  Surgeon: Aviva Signs, MD;  Location: [facility] ORS;  Service: General;  Laterality: Left;  . REDUCTION MAMMAPLASTY Bilateral 2002    FAMILY HISTORY: Family History  Problem Relation Age of Onset  . Heart disease Mother   . Diabetes Father   . Breast cancer Sister 87  . Breast cancer Maternal Aunt   . Colon cancer Neg Hx   . Liver disease Neg Hx     SOCIAL HISTORY:  reports that she has never smoked. She has never used smokeless tobacco. She reports current alcohol use. She reports that she does not use drugs.  PERFORMANCE STATUS: The patient's performance status is 2 -  Symptomatic, <50% confined to bed  PHYSICAL EXAM: Most Recent Vital Signs: Blood pressure 112/64, pulse 95, temperature 99.5 F (37.5 C), temperature source Oral, resp. rate 16, height 5\' 3"  (1.6 m), weight 115 lb 11.9 oz (52.5 kg), SpO2 100 %. BP 112/64   Pulse 95   Temp 99.5 F (37.5 C) (Oral)   Resp 16   Ht 5\' 3"  (1.6 m)   Wt 115 lb 11.9 oz (52.5 kg)   SpO2 100%   BMI 20.50 kg/m  General appearance: alert, cooperative and appears stated age Throat: lips, mucosa, and tongue normal; teeth and gums normal Neck: no adenopathy, supple, symmetrical, trachea midline  and thyroid not enlarged, symmetric, no tenderness/mass/nodules Lungs: clear to auscultation bilaterally Heart: regular rate and rhythm Abdomen: soft, non-tender; bowel sounds normal; no masses,  no organomegaly Extremities: extremities normal, atraumatic, no cyanosis or edema Skin: Skin color, texture, turgor normal. No rashes or lesions Lymph nodes: Cervical, supraclavicular, and axillary nodes normal. Neurologic: Grossly normal  LABORATORY DATA:  Results for orders placed or performed during the hospital encounter of 05/28/18 (from the past 48 hour(s))  Prepare Pheresed Platelets     Status: None (Preliminary result)   Collection Time: 05/28/18  1:20 PM  Result Value Ref Range   Unit Number [id number]    Blood Component Type PLTPHER LRI1    Unit division 00    Status of Unit ISSUED    Transfusion Status OK TO TRANSFUSE   SARS Coronavirus 2 (CEPHEID- Performed in [place] hospital lab), Hosp Order     Status: None   Collection Time: 05/28/18  5:26 PM  Result Value Ref Range   SARS Coronavirus 2 NEGATIVE NEGATIVE    Comment: (NOTE) If result is NEGATIVE SARS-CoV-2 target nucleic acids are NOT DETECTED. The SARS-CoV-2 RNA is generally detectable in upper and lower  respiratory specimens during the acute phase of infection. The lowest  concentration of SARS-CoV-2 viral copies this assay can detect is 250  copies / mL. A negative result does not preclude SARS-CoV-2 infection  and should not be used as the sole basis for treatment or other  patient management decisions.  A negative result may occur with  improper specimen collection / handling, submission of specimen other  than nasopharyngeal swab, presence of viral mutation(s) within the  areas targeted by this assay, and inadequate number of viral copies  (<250 copies / mL). A negative result must be combined with clinical  observations, patient history, and epidemiological information. If result is POSITIVE SARS-CoV-2  target nucleic acids are DETECTED. The SARS-CoV-2 RNA is generally detectable in upper and lower  respiratory specimens dur ing the acute phase of infection.  Positive  results are indicative of active infection with SARS-CoV-2.  Clinical  correlation with patient history and other diagnostic information is  necessary to determine patient infection status.  Positive results do  not rule out bacterial infection or co-infection with other viruses. If result is PRESUMPTIVE POSTIVE SARS-CoV-2 nucleic acids MAY BE PRESENT.   A presumptive positive result was obtained on the submitted specimen  and confirmed on repeat testing.  While 2019 novel coronavirus  (SARS-CoV-2) nucleic acids may be present in the submitted sample  additional confirmatory testing may be necessary for epidemiological  and / or clinical management purposes  to differentiate between  SARS-CoV-2 and other Sarbecovirus currently known to infect humans.  If clinically indicated additional testing with an alternate test  methodology [phone]) is advised. The SARS-CoV-2 RNA is generally  detectable in upper and lower respiratory sp ecimens during the acute  phase of infection. The expected result is Negative. Fact Sheet for Patients:  StrictlyIdeas.no Fact Sheet for Healthcare Providers: BankingDealers.co.za This test is not yet approved or cleared by the [place] FDA and has been authorized for detection and/or diagnosis of SARS-CoV-2 by FDA under an Emergency Use Authorization (EUA).  This EUA will remain in effect (meaning this test can be used) for the duration of the COVID-19 declaration under Section 564(b)(1) of the Act, 21 U.S.C. section 360bbb-3(b)(1), unless the authorization is terminated or revoked sooner. Performed at [facility], [address] [place], [place] [address]   Lactic acid, plasma     Status: None   Collection Time: 05/28/18  6:35 PM  Result  Value Ref Range   Lactic Acid, Venous 1.8 0.5 - 1.9 mmol/L    Comment: Performed at [facility], [address], [place], [place] [address]  Procalcitonin - Baseline     Status: None   Collection Time: 05/28/18  6:35 PM  Result Value Ref Range   Procalcitonin 0.46 ng/mL    Comment:        Interpretation: PCT (Procalcitonin) <= 0.5 ng/mL: Systemic infection (sepsis) is not likely. Local bacterial infection is possible. (NOTE)       Sepsis PCT Algorithm           Lower Respiratory Tract                                      Infection PCT Algorithm    ----------------------------     ----------------------------         PCT < 0.25 ng/mL                PCT < 0.10 ng/mL         Strongly encourage             Strongly discourage   discontinuation of antibiotics    initiation of antibiotics    ----------------------------     -----------------------------       PCT 0.25 - 0.50 ng/mL            PCT 0.10 - 0.25 ng/mL               OR       >80% decrease in PCT            Discourage initiation of                                            antibiotics      Encourage discontinuation           of antibiotics    ----------------------------     -----------------------------         PCT >= 0.50 ng/mL              PCT 0.26 - 0.50 ng/mL               AND        <80% decrease in PCT             Encourage initiation of  antibiotics       Encourage continuation           of antibiotics    ----------------------------     -----------------------------        PCT >= 0.50 ng/mL                  PCT > 0.50 ng/mL               AND         increase in PCT                  Strongly encourage                                      initiation of antibiotics    Strongly encourage escalation           of antibiotics                                     -----------------------------                                           PCT <= 0.25 ng/mL                                                  OR                                        > 80% decrease in PCT                                     Discontinue / Do not initiate                                             antibiotics Performed at [facility], [address], [place], [place] [address]   Respiratory Panel by PCR     Status: Abnormal   Collection Time: 05/28/18 11:13 PM  Result Value Ref Range   Adenovirus NOT DETECTED NOT DETECTED   Coronavirus 229E NOT DETECTED NOT DETECTED    Comment: (NOTE) The Coronavirus on the Respiratory Panel, DOES NOT test for the novel  Coronavirus (2019 nCoV)    Coronavirus HKU1 NOT DETECTED NOT DETECTED   Coronavirus NL63 NOT DETECTED NOT DETECTED   Coronavirus OC43 NOT DETECTED NOT DETECTED   Metapneumovirus NOT DETECTED NOT DETECTED   Rhinovirus / Enterovirus DETECTED (A) NOT DETECTED   Influenza A NOT DETECTED NOT DETECTED   Influenza B NOT DETECTED NOT DETECTED   Parainfluenza Virus 1 NOT DETECTED NOT DETECTED   Parainfluenza Virus 2 NOT DETECTED NOT DETECTED   Parainfluenza Virus 3 NOT DETECTED NOT DETECTED   Parainfluenza Virus 4 NOT DETECTED NOT DETECTED   Respiratory Syncytial Virus NOT DETECTED NOT  DETECTED   Bordetella pertussis NOT DETECTED NOT DETECTED   Chlamydophila pneumoniae NOT DETECTED NOT DETECTED   Mycoplasma pneumoniae NOT DETECTED NOT DETECTED    Comment: Performed at [facility], [place] [address], [place], [place] [address]  MRSA PCR Screening     Status: None   Collection Time: 05/28/18 11:13 PM  Result Value Ref Range   MRSA by PCR NEGATIVE NEGATIVE    Comment:        The GeneXpert MRSA Assay (FDA approved for NASAL specimens only), is one component of a comprehensive MRSA colonization surveillance program. It is not intended to diagnose MRSA infection nor to guide or monitor treatment for MRSA infections. Performed at [facility], [address] [place], [place] [address]   Lactic acid, plasma     Status: Abnormal    Collection Time: 05/28/18 11:20 PM  Result Value Ref Range   Lactic Acid, Venous 2.0 (HH) 0.5 - 1.9 mmol/L    Comment: CRITICAL RESULT CALLED TO, READ BACK BY AND VERIFIED WITH: J RHUE,RN @2352  05/28/18 MKELLY Performed at [facility], [address] [place], [place] [address]   Urinalysis, Routine w reflex microscopic     Status: Abnormal   Collection Time: 05/28/18 11:36 PM  Result Value Ref Range   Color, Urine YELLOW YELLOW   APPearance CLEAR CLEAR   Specific Gravity, Urine 1.013 1.005 - 1.030   pH 5.0 5.0 - 8.0   Glucose, UA >=500 (A) NEGATIVE mg/dL   Hgb urine dipstick NEGATIVE NEGATIVE   Bilirubin Urine NEGATIVE NEGATIVE   Ketones, ur NEGATIVE NEGATIVE mg/dL   Protein, ur 30 (A) NEGATIVE mg/dL   Nitrite NEGATIVE NEGATIVE   Leukocytes,Ua NEGATIVE NEGATIVE   RBC / HPF 0-5 0 - 5 RBC/hpf   WBC, UA 0-5 0 - 5 WBC/hpf   Bacteria, UA NONE SEEN NONE SEEN   Squamous Epithelial / LPF 0-5 0 - 5    Comment: Performed at [facility], [address], [place],  [address]  Procalcitonin     Status: None   Collection Time: 05/29/18  5:34 AM  Result Value Ref Range   Procalcitonin 11.41 ng/mL    Comment:        Interpretation: PCT >= 10 ng/mL: Important systemic inflammatory response, almost exclusively due to severe bacterial sepsis or septic shock. (NOTE)       Sepsis PCT Algorithm           Lower Respiratory Tract                                      Infection PCT Algorithm    ----------------------------     ----------------------------         PCT < 0.25 ng/mL                PCT < 0.10 ng/mL         Strongly encourage             Strongly discourage   discontinuation of antibiotics    initiation of antibiotics    ----------------------------     -----------------------------       PCT 0.25 - 0.50 ng/mL            PCT 0.10 - 0.25 ng/mL               OR       >80% decrease in PCT  Discourage initiation of                                            antibiotics       Encourage discontinuation           of antibiotics    ----------------------------     -----------------------------         PCT >= 0.50 ng/mL              PCT 0.26 - 0.50 ng/mL                AND       <80% decrease in PCT             Encourage initiation of                                             antibiotics       Encourage continuation           of antibiotics    ----------------------------     -----------------------------        PCT >= 0.50 ng/mL                  PCT > 0.50 ng/mL               AND         increase in PCT                  Strongly encourage                                      initiation of antibiotics    Strongly encourage escalation           of antibiotics                                     -----------------------------                                           PCT <= 0.25 ng/mL                                                 OR                                        > 80% decrease in PCT                                     Discontinue / Do not initiate  antibiotics Performed at [facility], [address] [place], [place] [address]   CBC     Status: Abnormal   Collection Time: 05/29/18  5:34 AM  Result Value Ref Range   WBC 0.2 (LL) 4.0 - 10.5 K/uL    Comment: REPEATED TO VERIFY WHITE COUNT CONFIRMED ON SMEAR THIS CRITICAL RESULT HAS VERIFIED AND BEEN CALLED TO [place] BY LATISHA HENDERSON ON 05 15 2020 AT 0632, AND HAS BEEN READ BACK. CRITICAL RESULT VERIFIED    RBC 1.96 (L) 3.87 - 5.11 MIL/uL   Hemoglobin 6.9 (LL) 12.0 - 15.0 g/dL    Comment: REPEATED TO VERIFY THIS CRITICAL RESULT HAS VERIFIED AND BEEN CALLED TO [place] BY LATISHA HENDERSON ON 05 15 2020 AT 0632, AND HAS BEEN READ BACK. CRITICAL RESULT VERIFIED    HCT 20.1 (L) 36.0 - 46.0 %   MCV 102.6 (H) 80.0 - 100.0 fL   MCH 35.2 (H) 26.0 - 34.0 pg   MCHC 34.3 30.0 - 36.0 g/dL   RDW 18.2 (H) 11.5 - 15.5 %   Platelets 43 (L) 150 - 400 K/uL     Comment: REPEATED TO VERIFY SPECIMEN CHECKED FOR CLOTS CONSISTENT WITH PREVIOUS RESULT    nRBC 0.0 0.0 - 0.2 %    Comment: Performed at [facility], [address] [place], [place] [address]  Basic metabolic panel     Status: Abnormal   Collection Time: 05/29/18  5:34 AM  Result Value Ref Range   Sodium 136 135 - 145 mmol/L    Comment: DELTA CHECK NOTED   Potassium 3.5 3.5 - 5.1 mmol/L   Chloride 107 98 - 111 mmol/L   CO2 21 (L) 22 - 32 mmol/L   Glucose, Bld 354 (H) 70 - 99 mg/dL   BUN 30 (H) 6 - 20 mg/dL   Creatinine, Ser 0.84 0.44 - 1.00 mg/dL   Calcium 8.3 (L) 8.9 - 10.3 mg/dL   GFR calc non Af Amer >60 >60 mL/min   GFR calc Af Amer >60 >60 mL/min   Anion gap 8 5 - 15    Comment: Performed at [facility], [address], [place], [place] [address]  Differential     Status: Abnormal   Collection Time: 05/29/18  5:34 AM  Result Value Ref Range   Neutrophils Relative % 6 %   Neutro Abs 0.0 (L) 1.7 - 7.7 K/uL   Lymphocytes Relative 75 %   Lymphs Abs 0.1 (L) 0.7 - 4.0 K/uL   Monocytes Relative 13 %   Monocytes Absolute 0.0 (L) 0.1 - 1.0 K/uL   Eosinophils Relative 0 %   Eosinophils Absolute 0.0 0.0 - 0.5 K/uL   Basophils Relative 0 %   Basophils Absolute 0.0 0.0 - 0.1 K/uL    Comment: Performed at [facility], [address] [place], [place] [address]  Type and screen [facility]     Status: None (Preliminary result)   Collection Time: 05/29/18  7:14 AM  Result Value Ref Range   ABO/RH(D) B POS    Antibody Screen NEG    Sample Expiration 06/01/2018,2359    Unit Number [id number]    Blood Component Type RBC, LR IRR    Unit division 00    Status of Unit ISSUED    Transfusion Status OK TO TRANSFUSE    Crossmatch Result      Compatible Performed at [facility], [address] [place], [place] [address]   Hemoglobin A1c     Status: Abnormal  Collection Time: 05/29/18  7:15 AM  Result Value Ref Range   Hgb A1c MFr Bld 8.5 (H) 4.8 - 5.6 %    Comment:  (NOTE) Pre diabetes:          5.7%-6.4% Diabetes:              >6.4% Glycemic control for   <7.0% adults with diabetes    Mean Plasma Glucose 197.25 mg/dL    Comment: Performed at [place] [address], [place], [place] [address]  Prepare RBC     Status: None   Collection Time: 05/29/18  7:19 AM  Result Value Ref Range   Order Confirmation      ORDER PROCESSED BY BLOOD BANK Performed at [facility], [address] [place], [place] [address]   Glucose, capillary     Status: Abnormal   Collection Time: 05/29/18  8:21 AM  Result Value Ref Range   Glucose-Capillary 308 (H) 70 - 99 mg/dL  Magnesium     Status: None   Collection Time: 05/29/18  8:30 AM  Result Value Ref Range   Magnesium 1.8 1.7 - 2.4 mg/dL    Comment: Performed at [facility], [address], [place], [place] [address]  Glucose, capillary     Status: Abnormal   Collection Time: 05/29/18 11:08 AM  Result Value Ref Range   Glucose-Capillary 318 (H) 70 - 99 mg/dL  Glucose, capillary     Status: Abnormal   Collection Time: 05/29/18  4:11 PM  Result Value Ref Range   Glucose-Capillary 141 (H) 70 - 99 mg/dL      RADIOGRAPHY: Dg Chest Portable 1 View  Result Date: 05/28/2018 CLINICAL DATA:  Neutropenic patient with productive cough and fever for 2-3 days. EXAM: PORTABLE CHEST 1 VIEW COMPARISON:  Single-view of the chest 04/01/2018. PA and lateral chest 03/18/2018. FINDINGS: Lungs are clear. Heart size is normal. Port-A-Cath noted. No pneumothorax or pleural fluid. No bony abnormality. IMPRESSION: Negative chest. Electronically Signed   By: Inge Rise M.D.   On: 05/28/2018 17:51        ASSESSMENT and pLAN:  1.  Neutropenic fever: -She has severe pancytopenia from her last cycle of chemotherapy on 05/20/2018. - T-max of 102.8 last night with temperature decreasing to 100.6 this morning.  She has been afebrile since then. -She is on vancomycin and aztreonam.  Blood cultures will be followed. -She has  severe neutropenia.  However she has received Neulasta injection after her chemotherapy.  Hence this will likely improve in the next few days. - Platelet count improved to 43 after transfusion last night. -She received 1 unit of PRBC for hemoglobin of 6.9 today. - She will continue intravenous antibiotics.  If she stays afebrile, she would like to be discharged Saturday evening.  It is okay with me, to discharge her on Augmentin 875 twice daily.  If she continues to spike fevers, she will stay in the hospital. -Please call me on my cell phone if any questions on Saturday.  2.  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 - She will follow-up with Dr. Minna Antis at [facility] next week for pretransplant work-up.  All questions were answered. The patient knows to call the clinic with any problems, questions or concerns. We can certainly see the patient much sooner if necessary.    Derek Jack

## 2018-05-29 NOTE — TOC Initial Note (Signed)
Transition of Care [facility]) - Initial/Assessment Note    Patient Details  Name: Kristin Good MRN: [id number] Date of Birth: 1961/01/01  Transition of [facility] CM/SW Contact:    Sherald Barge, RN Phone Number: 05/29/2018, 9:53 AM  Clinical Narrative:     TOC assessment for high readmission risk score. Pt admitted with neutropenic fever. 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 Pt  Mostly ind with ADL's. Has family support. Pt has insurance, no difficulty affording medications or transportation to MD appointments. Pt has had multiple admission in past 6 months, all related to cancer dx. Pt is will supported through cancer center. No TOC needs identified at this time. TOC will sign off and may be consulted should needs arise.               Expected Discharge Plan: Home/Self Care      Expected Discharge Plan and Services Expected Discharge Plan: Home/Self Care In-house Referral: NA     Living arrangements for the past 2 months: Single Family Home                   Prior Living Arrangements/Services Living arrangements for the past 2 months: Single Family Home Lives with:: Spouse Patient language and need for interpreter reviewed:: Yes Do you feel safe going back to the place where you live?: Yes      Need for Family Participation in Patient Care: Yes (Comment) Care giver support system in place?: Yes (comment)   Criminal Activity/Legal Involvement Pertinent to Current Situation/Hospitalization: No - Comment as needed  Permission Sought/Granted Permission sought to share information with : (N/A) Permission granted to share information with : (N/A)      Emotional Assessment       Orientation: : Oriented to Self, Oriented to Place, Oriented to  Time, Oriented to Situation Alcohol / Substance Use: Not Applicable Psych Involvement: (N/A)  Admission diagnosis:  Fever Patient Active Problem List   Diagnosis Date Noted  . Diabetes mellitus due to  underlying condition, uncontrolled, with hyperglycemia, without long-term current use of insulin ([place]) 05/29/2018  . Hypomagnesemia 05/29/2018  . Neutropenic fever ([place]) 05/28/2018  . Febrile neutropenia ([place]) 02/08/2018  . Lymphoma, peripheral T-cell, spleen ([place]) 01/27/2018  . Protein-calorie malnutrition, severe 01/20/2018  . Transaminasemia   . Pancytopenia ([place]) 01/18/2018  . Essential hypertension 01/18/2018  . AKI (acute kidney injury) ([place])   . Dehydration   . Transaminitis   . IBS (irritable bowel syndrome) 07/03/2010  . Colon cancer screening 07/03/2010   PCP:  Marinda Elk, MD Pharmacy:   [place], [place] - [address] [place] [address] Phone: [phone] Fax: [phone]    Readmission Risk Interventions Readmission Risk Prevention Plan 05/29/2018  Transportation Screening Complete  Medication Review ([place]) Complete  HRI or [place] Complete  SW Recovery Care/Counseling Consult Complete  [place] Not Applicable  Some recent data might be hidden

## 2018-05-29 NOTE — Progress Notes (Signed)
PROGRESS NOTE                                                                                                                                                                                                             Patient Demographics:    Kristin Good, is a 58 y.o. female, DOB - 08/02/1960, CWU:[id number]  Admit date - 05/28/2018   Admitting Physician Debbe Odea, MD  Outpatient Primary MD for the patient is Marinda Elk, MD  LOS - 1  Outpatient Specialists: Dr. Delton Coombes (oncology)  Chief Complaint  Patient presents with  . 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  Reported some chills but no headache, blurred vision, chest pain, shortness of breath, abdominal pain, dysuria or diarrhea.  She reports cough with clear sputum for 2 weeks which became greenish on the day of admission.  Also had significant loss of appetite. Patient found to be pancytopenic with platelets of 8 and admitted to hospital service with neutropenic fever.   Subjective:   Patient reports that she still feels weak but does not have nausea this morning or further vomiting.   Assessment  & Plan :    Principal Problem:   Neutropenic fever ([place])   Pancytopenia ([place]) No clear etiology.  Blood culture sent on admission.  COVID-19 ruled out.  Placed on empiric vancomycin and aztreonam.  Follow cultures.  He received chemotherapy recently.  Still has significant neutropenia in a.m. labs (differential pending).  Continue Valtrex. Noted for hemoglobin dropping to 6.9 this morning, ordered 1 unit PRBC. Platelets improved to 43 after transfusion on admission. Monitor CBC with ANC daily.  Oncology  consult as needed. He has .  Active Problems: Nausea and vomiting with dehydration. Secondary to neutropenic fever and pancytopenia.  Continue IV fluids.     type II diabetes mellitus, uncontrolled with hyperglycemia CBG in 300s to 400s.  Patient only on metformin at home.  When she was 5.7 about 4 months back but her CBG in the last 2 weeks has been in 200s to 300s.  I am holding her metformin and  placed her on Lantus 15 units daily with pre-meal aspart.  Check A1c.    Essential hypertension    Lymphoma, peripheral T-cell, spleen (HCC) Currently on chemotherapy, being followed by oncology.  Hypomagnesemia Replenished  Hypothyroidism Continue Synthroid.  Hyperlipidemia Continue statin.  Protein calorie malnutrition, severe Nutrition consult   Code Status : Full code  Family Communication  : None at bedside  Disposition Plan  : Home possibly in the next 48 hours if remains afebrile and ANC improving  Barriers For Discharge : Active symptoms  Consults  : Oncology  Procedures  : None  DVT Prophylaxis  : SCDs  Lab Results  Component Value Date   PLT 43 (L) 05/29/2018    Antibiotics  :    Anti-infectives (From admission, onward)   Start     Dose/Rate Route Frequency Ordered Stop   05/29/18 1500  vancomycin (VANCOCIN) IVPB 750 mg/150 ml premix     750 mg 150 mL/hr over 60 Minutes Intravenous Every 24 hours 05/28/18 1919     05/29/18 1000  valACYclovir (VALTREX) tablet 500 mg     500 mg Oral Daily 05/28/18 2048     05/29/18 0200  aztreonam (AZACTAM) 2 g in sodium chloride 0.9 % 100 mL IVPB     2 g 200 mL/hr over 30 Minutes Intravenous Every 8 hours 05/28/18 1922     05/28/18 1800  aztreonam (AZACTAM) 1 g in sodium chloride 0.9 % 100 mL IVPB  Status:  Discontinued     1 g 200 mL/hr over 30 Minutes Intravenous Every 8 hours 05/28/18 1724 05/28/18 1922        Objective:   Vitals:   05/29/18 0145 05/29/18 0330 05/29/18 0621 05/29/18 0826  BP: 95/63 (!)  96/54 132/74 110/60  Pulse: 100 100 (!) 110 (!) 111  Resp: 16 18 16 19   Temp: 98.3 F (36.8 C) 98.3 F (36.8 C) 99.5 F (37.5 C) (!) 100.6 F (38.1 C)  TempSrc: Oral Oral Oral Oral  SpO2: 100% 100% 100% 100%  Weight:      Height:        Wt Readings from Last 3 Encounters:  05/28/18 52.5 kg  05/22/18 50.9 kg  05/21/18 51.3 kg     Intake/Output Summary (Last 24 hours) at 05/29/2018 0854 Last data filed at 05/29/2018 0130 Gross per 24 hour  Intake 893 ml  Output -  Net 893 ml     Physical Exam  Gen: not in distress, fatigued HEENT: Pallor present, no oral thrush, moist mucosa, supple neck Chest: clear b/l, no added sounds, left-sided port CVS: N S1&S2, no murmurs, rubs or gallop GI: soft, NT, ND, BS+ Musculoskeletal: warm, no edema, no rash or joint tenderness     Data Review:    CBC Recent Labs  Lab 05/26/18 0951 05/28/18 1320 05/29/18 0534  WBC 1.6* 0.2* 0.2*  HGB 9.8* 8.6* 6.9*  HCT 30.0* 25.0* 20.1*  PLT 24* 8* 43*  MCV 106.0* 100.8* 102.6*  MCH 34.6* 34.7* 35.2*  MCHC 32.7 34.4 34.3  RDW 19.6* 18.3* 18.2*  LYMPHSABS 0.2* 0.1*  --   MONOABS 0.0* 0.0*  --   EOSABS 0.0 0.0  --   BASOSABS 0.0 0.0  --     Chemistries  Recent Labs  Lab 05/26/18 0951 05/28/18 1320 05/29/18 0534 05/29/18 0830  NA 138 128* 136  --   K 4.9 3.6 3.5  --   CL 107 95* 107  --   CO2 21* 22 21*  --  GLUCOSE 451* 456* 354*  --   BUN 40* 32* 30*  --   CREATININE 0.97 0.93 0.84  --   CALCIUM 9.2 8.6* 8.3*  --   MG 2.0 1.6*  --  1.8  AST 13* 10*  --   --   ALT 17 13  --   --   ALKPHOS 74 59  --   --   BILITOT 0.8 0.9  --   --    ------------------------------------------------------------------------------------------------------------------ No results for input(s): CHOL, HDL, LDLCALC, TRIG, CHOLHDL, LDLDIRECT in the last 72 hours.  Lab Results  Component Value Date   HGBA1C 5.7 (H) 01/19/2018    ------------------------------------------------------------------------------------------------------------------ No results for input(s): TSH, T4TOTAL, T3FREE, THYROIDAB in the last 72 hours.  Invalid input(s): FREET3 ------------------------------------------------------------------------------------------------------------------ No results for input(s): VITAMINB12, FOLATE, FERRITIN, TIBC, IRON, RETICCTPCT in the last 72 hours.  Coagulation profile No results for input(s): INR, PROTIME in the last 168 hours.  No results for input(s): DDIMER in the last 72 hours.  Cardiac Enzymes No results for input(s): CKMB, TROPONINI, MYOGLOBIN in the last 168 hours.  Invalid input(s): CK ------------------------------------------------------------------------------------------------------------------    Component Value Date/Time   BNP 60.0 01/18/2018 1218    Inpatient Medications  Scheduled Meds: . sodium chloride   Intravenous Once  . atorvastatin  20 mg Oral q morning - 10a  . insulin aspart  0-15 Units Subcutaneous TID WC  . insulin aspart  0-5 Units Subcutaneous QHS  . insulin aspart  3 Units Subcutaneous TID WC  . insulin glargine  15 Units Subcutaneous Daily  . levothyroxine  75 mcg Oral Q0600  . valACYclovir  500 mg Oral Daily   Continuous Infusions: . sodium chloride 100 mL/hr at 05/28/18 2021  . aztreonam 200 mL/hr at 05/29/18 0416  . vancomycin     PRN Meds:.lidocaine-prilocaine, loperamide, prochlorperazine, zolpidem  Micro Results Recent Results (from the past 240 hour(s))  Culture, blood (routine x 2)     Status: None (Preliminary result)   Collection Time: 05/28/18  1:19 PM  Result Value Ref Range Status   Specimen Description BLOOD RIGHT ANTECUBITAL  Final   Special Requests   Final    BOTTLES DRAWN AEROBIC AND ANAEROBIC Blood Culture adequate volume   Culture   Final    NO GROWTH < 24 HOURS Performed at [facility], [address], [place], [place] [address]     Report Status PENDING  Incomplete  Culture, blood (Routine X 2) w Reflex to ID Panel     Status: None (Preliminary result)   Collection Time: 05/28/18  1:20 PM  Result Value Ref Range Status   Specimen Description BLOOD LEFT ANTECUBITAL  Final   Special Requests   Final    BOTTLES DRAWN AEROBIC AND ANAEROBIC Blood Culture adequate volume   Culture   Final    NO GROWTH < 24 HOURS Performed at [facility], [address], [place], [place] [address]    Report Status PENDING  Incomplete  SARS Coronavirus 2 (CEPHEID- Performed in [place] hospital lab), Hosp Order     Status: None   Collection Time: 05/28/18  5:26 PM  Result Value Ref Range Status   SARS Coronavirus 2 NEGATIVE NEGATIVE Final    Comment: (NOTE) If result is NEGATIVE SARS-CoV-2 target nucleic acids are NOT DETECTED. The SARS-CoV-2 RNA is generally detectable in upper and lower  respiratory specimens during the acute phase of infection. The lowest  concentration of SARS-CoV-2 viral copies this assay can detect is 250  copies / mL. A negative result does not preclude SARS-CoV-2 infection  and should not be used as the sole basis for treatment or other  patient management decisions.  A negative result may occur with  improper specimen collection / handling, submission of specimen other  than nasopharyngeal swab, presence of viral mutation(s) within the  areas targeted by this assay, and inadequate number of viral copies  (<250 copies / mL). A negative result must be combined with clinical  observations, patient history, and epidemiological information. If result is POSITIVE SARS-CoV-2 target nucleic acids are DETECTED. The SARS-CoV-2 RNA is generally detectable in upper and lower  respiratory specimens dur ing the acute phase of infection.  Positive  results are indicative of active infection with SARS-CoV-2.  Clinical  correlation with patient history and other diagnostic information is  necessary to determine  patient infection status.  Positive results do  not rule out bacterial infection or co-infection with other viruses. If result is PRESUMPTIVE POSTIVE SARS-CoV-2 nucleic acids MAY BE PRESENT.   A presumptive positive result was obtained on the submitted specimen  and confirmed on repeat testing.  While 2019 novel coronavirus  (SARS-CoV-2) nucleic acids may be present in the submitted sample  additional confirmatory testing may be necessary for epidemiological  and / or clinical management purposes  to differentiate between  SARS-CoV-2 and other Sarbecovirus currently known to infect humans.  If clinically indicated additional testing with an alternate test  methodology [phone]) is advised. The SARS-CoV-2 RNA is generally  detectable in upper and lower respiratory sp ecimens during the acute  phase of infection. The expected result is Negative. Fact Sheet for Patients:  StrictlyIdeas.no Fact Sheet for Healthcare Providers: BankingDealers.co.za This test is not yet approved or cleared by the [place] FDA and has been authorized for detection and/or diagnosis of SARS-CoV-2 by FDA under an Emergency Use Authorization (EUA).  This EUA will remain in effect (meaning this test can be used) for the duration of the COVID-19 declaration under Section 564(b)(1) of the Act, 21 U.S.C. section 360bbb-3(b)(1), unless the authorization is terminated or revoked sooner. Performed at [facility], [address], [place], [place] [address]   MRSA PCR Screening     Status: None   Collection Time: 05/28/18 11:13 PM  Result Value Ref Range Status   MRSA by PCR NEGATIVE NEGATIVE Final    Comment:        The GeneXpert MRSA Assay (FDA approved for NASAL specimens only), is one component of a comprehensive MRSA colonization surveillance program. It is not intended to diagnose MRSA infection nor to guide or monitor treatment for MRSA infections.  Performed at [facility], [address], [place], [place] [address]     Radiology Reports Dg Chest Portable 1 View  Result Date: 05/28/2018 CLINICAL DATA:  Neutropenic patient with productive cough and fever for 2-3 days. EXAM: PORTABLE CHEST 1 VIEW COMPARISON:  Single-view of the chest 04/01/2018. PA and lateral chest 03/18/2018. FINDINGS: Lungs are clear. Heart size is normal. Port-A-Cath noted. No pneumothorax or pleural fluid. No bony abnormality. IMPRESSION: Negative chest. Electronically Signed   By: Inge Rise M.D.   On: 05/28/2018 17:51    Time Spent in minutes 35   Finleigh Cheong M.D on 05/29/2018 at 8:54 AM  Between 7am to 7pm - Pager - [phone]  After 7pm go to [email or web address] - password [facility]  [facility] -  Office  [phone]

## 2018-05-30 DIAGNOSIS — E876 Hypokalemia: Secondary | ICD-10-CM

## 2018-05-30 DIAGNOSIS — I10 Essential (primary) hypertension: Secondary | ICD-10-CM

## 2018-05-30 LAB — CBC WITH DIFFERENTIAL/PLATELET
Abs Immature Granulocytes: 0 10*3/uL (ref 0.00–0.07)
Basophils Absolute: 0 10*3/uL (ref 0.0–0.1)
Basophils Relative: 0 %
Eosinophils Absolute: 0 10*3/uL (ref 0.0–0.5)
Eosinophils Relative: 4 %
HCT: 21.2 % — ABNORMAL LOW (ref 36.0–46.0)
Hemoglobin: 7.4 g/dL — ABNORMAL LOW (ref 12.0–15.0)
Immature Granulocytes: 0 %
Lymphocytes Relative: 52 %
Lymphs Abs: 0.1 10*3/uL — ABNORMAL LOW (ref 0.7–4.0)
MCH: 32.7 pg (ref 26.0–34.0)
MCHC: 34.9 g/dL (ref 30.0–36.0)
MCV: 93.8 fL (ref 80.0–100.0)
Monocytes Absolute: 0 10*3/uL — ABNORMAL LOW (ref 0.1–1.0)
Monocytes Relative: 8 %
Neutro Abs: 0.1 10*3/uL — ABNORMAL LOW (ref 1.7–7.7)
Neutrophils Relative %: 36 %
Platelets: 22 10*3/uL — CL (ref 150–400)
RBC: 2.26 MIL/uL — ABNORMAL LOW (ref 3.87–5.11)
RDW: 20.3 % — ABNORMAL HIGH (ref 11.5–15.5)
WBC: 0.3 10*3/uL — CL (ref 4.0–10.5)
nRBC: 0 % (ref 0.0–0.2)

## 2018-05-30 LAB — GLUCOSE, CAPILLARY: Glucose-Capillary: 172 mg/dL — ABNORMAL HIGH (ref 70–99)

## 2018-05-30 LAB — BASIC METABOLIC PANEL
Anion gap: 8 (ref 5–15)
BUN: 17 mg/dL (ref 6–20)
CO2: 20 mmol/L — ABNORMAL LOW (ref 22–32)
Calcium: 8 mg/dL — ABNORMAL LOW (ref 8.9–10.3)
Chloride: 111 mmol/L (ref 98–111)
Creatinine, Ser: 0.61 mg/dL (ref 0.44–1.00)
GFR calc Af Amer: >60 mL/min (ref >60)
GFR calc non Af Amer: >60 mL/min (ref >60)
Glucose, Bld: 170 mg/dL — ABNORMAL HIGH (ref 70–99)
Potassium: 2.2 mmol/L — CL (ref 3.5–5.1)
Sodium: 139 mmol/L (ref 135–145)

## 2018-05-30 LAB — PREPARE PLATELET PHERESIS: Unit division: 0

## 2018-05-30 LAB — BPAM PLATELET PHERESIS
Blood Product Expiration Date: 202005162359
ISSUE DATE / TIME: 202005150116
Unit Type and Rh: 7300

## 2018-05-30 LAB — TYPE AND SCREEN
ABO/RH(D): B POS
Antibody Screen: NEGATIVE
Unit division: 0

## 2018-05-30 LAB — URINE CULTURE: Culture: NO GROWTH

## 2018-05-30 LAB — BPAM RBC
Blood Product Expiration Date: 202006122359
ISSUE DATE / TIME: 202005151458
Unit Type and Rh: 9500

## 2018-05-30 LAB — PROCALCITONIN: Procalcitonin: 8.59 ng/mL

## 2018-05-30 MED ORDER — POTASSIUM CHLORIDE ER 10 MEQ PO TBCR: EXTENDED_RELEASE_TABLET | ORAL | 0 refills | Status: AC

## 2018-05-30 MED ORDER — TBO-FILGRASTIM 480 MCG/0.8ML ~~LOC~~ SOSY
480.0000 ug | PREFILLED_SYRINGE | Freq: Once | SUBCUTANEOUS | Status: DC
  Filled 2018-05-30: qty 0.8

## 2018-05-30 MED ORDER — FILGRASTIM 480 MCG/1.6ML IJ SOLN
480.0000 ug | Freq: Once | INTRAMUSCULAR | Status: DC
  Filled 2018-05-30: qty 1.6

## 2018-05-30 MED ORDER — POTASSIUM CHLORIDE CRYS ER 20 MEQ PO TBCR
40.0000 meq | EXTENDED_RELEASE_TABLET | ORAL | Status: DC
  Administered 2018-05-30: 40 meq via ORAL
  Filled 2018-05-30: qty 2

## 2018-05-30 MED ORDER — GLUCERNA SHAKE PO LIQD: 237.0000 mL | Freq: Three times a day (TID) | ORAL | 0 refills | Status: AC

## 2018-05-30 MED ORDER — AMOXICILLIN-POT CLAVULANATE 875-125 MG PO TABS: 1.0000 | ORAL_TABLET | Freq: Two times a day (BID) | ORAL | 0 refills | Status: DC

## 2018-05-30 NOTE — Discharge Summary (Signed)
Physician Discharge Summary  SHERYN ALDAZ NWG:[id number] DOB: 06-20-1960 DOA: 05/28/2018  PCP: Marinda Elk, MD  Admit date: 05/28/2018 Discharge date: 05/30/2018  Admitted From: home Disposition:  home   Recommendations for Outpatient Follow-up:  1. F/u on CBC, Bmet and Mg on  Tuesday  Home Health:  none  Equipment/Devices:  none    Discharge Condition:  stable   CODE STATUS:  Full code   Consultations:  Oncology     Discharge Diagnoses:  Principal Problem:   Neutropenic fever ([place]) Active Problems:   Pancytopenia ([place])   Essential hypertension   Lymphoma, peripheral T-cell, spleen (HCC)   Diabetes mellitus due to underlying condition, uncontrolled, with hyperglycemia, without long-term current use of insulin (HCC)   Hypokalemia, Hypomagnesemia      Brief Summary: TERILYN SANO is a 58 y.o.femalewith a history of atypical T-cell lymphoma, hypothyroidism, hypertension, diabetes who presents for fever. She is on chemotherapy for her lymphoma and is found to be neutropenic in the ED. She did receive Neulasta a few days ago. She has had a cough for about a week but states it is improving. No other symptoms.  In ED > WBCV 0.2, Hb 6.9, Plt 43. She was transfused 1 U PRBC and 1 U platelets. Started on Vancomycin and Aztreonam for neutropenic fever.   Hospital Course:  Principal Problem:   Neutropenic fever  - respiratory viral panel + for rhinovirus- she states the cough she has had for a week is actually now resolving - blood cultures are negative thus far  - COVID 19 negative as well - the patient is inisisting on going home today - As her WBC count has not improved and she states she was given Neulasta about 1.5 wks ago, I have ordered a dose of Granix to be given to her today - I have also given her a prescription for Augmentin and advised to return to the ED or call PCP if fever do not resolve - she has an appt with her oncologist at [facility] in 2 days and plans to  have further blood work done then  Active Problems:     Pancytopenia  - Hb has improved to 7.4 - platelets 22 today but no signs of bleeding and thus will hold off of transfusing more platelets today   Hypotension with h/o Essential hypertension - Maxzide and Lasix on hold for now- discussed with patient to not resume these yet  Hypothyroidism - cont synthroid    Lymphoma, peripheral T-cell, spleen  - currently on chemotherapy    Diabetes mellitus due to underlying condition, uncontrolled, with hyperglycemia, without long-term current use of insulin  - she takes Metformin at home    Hypomagnesemia, Hypokalemia - replacing- she will get a total of 80 meq of K+ today (she states she has some KCL at home as well)   Discharge Exam: Vitals:   05/30/18 0540 05/30/18 0817  BP: 139/85   Pulse: 96   Resp:    Temp: 98.7 F (37.1 C)   SpO2: 100% 100%   Vitals:   05/29/18 2008 05/29/18 2100 05/30/18 0540 05/30/18 0817  BP:  136/69 139/85   Pulse: 96 (!) 104 96   Resp: 20 16    Temp:  99.9 F (37.7 C) 98.7 F (37.1 C)   TempSrc:  Oral Oral   SpO2: 98% 100% 100% 100%  Weight:      Height:        General: Pt is alert, awake, not  in acute distress Cardiovascular: RRR, S1/S2 +, no rubs, no gallops Respiratory: CTA bilaterally, no wheezing, no rhonchi Abdominal: Soft, NT, ND, bowel sounds + Extremities: no edema, no cyanosis   Discharge Instructions  Discharge Instructions    Diet - low sodium heart healthy   Complete by:  As directed    Increase activity slowly   Complete by:  As directed      Allergies as of 05/30/2018      Reactions   Doxycycline Anaphylaxis   Penicillins Anaphylaxis   Did it involve swelling of the face/tongue/throat, SOB, or low BP? Yes-SOB Did it involve sudden or severe rash/hives, skin peeling, or any reaction on the inside of your mouth or nose? Unknown Did you need to seek medical attention at a hospital or doctor's office? Yes When  did it last happen?Over 10 years ago If all above answers are "NO", may proceed with cephalosporin use.   Levofloxacin Nausea And Vomiting   Sulfa Antibiotics Itching, Rash      Medication List    STOP taking these medications   triamterene-hydrochlorothiazide 37.5-25 MG tablet Commonly known as:  MAXZIDE-25     TAKE these medications   ADRIAMYCIN IV Inject 84 mg into the vein every 21 ( twenty-one) days.   amoxicillin-clavulanate 875-125 MG tablet Commonly known as:  AUGMENTIN Take 1 tablet by mouth 2 (two) times daily.   atorvastatin 20 MG tablet Commonly known as:  LIPITOR Take 20 mg by mouth every morning.   Calcium 500+D High Potency 500-400 MG-UNIT tablet Generic drug:  calcium-vitamin D Take 1 tablet by mouth 2 (two) times daily.   cyclophosphamide in sodium chloride 0.9 % 250 mL Inject 1,260 mg into the vein every 21 ( twenty-one) days.   ETOPOSIDE IV Inject 170 mg into the vein every 21 ( twenty-one) days. Days 1-3   feeding supplement (GLUCERNA SHAKE) Liqd Take 237 mLs by mouth 3 (three) times daily between meals.   furosemide 20 MG tablet Commonly known as:  LASIX Take 1 tablet (20 mg total) by mouth daily as needed.   GLUCOSAMINE 1500 COMPLEX PO Take 1 capsule by mouth 2 (two) times daily.   levothyroxine 75 MCG tablet Commonly known as:  SYNTHROID Take 75 mcg by mouth daily before breakfast.   lidocaine-prilocaine cream Commonly known as:  EMLA Apply 1 application topically as needed.   loperamide 2 MG capsule Commonly known as:  IMODIUM Take 2 mg by mouth as needed for diarrhea or loose stools.   magic mouthwash Soln Take 5 mLs by mouth 3 (three) times daily as needed for mouth pain. With lidocaine 1% 1:1 solution   magnesium oxide 400 (241.3 Mg) MG tablet Commonly known as:  MAG-OX TAKE 1 TABLET BY MOUTH TWICE DAILY   metFORMIN 1000 MG tablet Commonly known as:  GLUCOPHAGE Take 1,000 mg by mouth 2 (two) times daily with a meal.    Neulasta 6 MG/0.6ML injection Generic drug:  pegfilgrastim Inject 6 mg into the skin See admin instructions. To be administered 3 days after chemo therapy treatment   potassium chloride 10 MEQ tablet Commonly known as:  K-DUR Take 40 meq today and then 10 meq daily.   predniSONE 20 MG tablet Commonly known as:  DELTASONE TAKE 5 TABLETS BY MOUTH ONCE DAILY ON DAYS 1-5 OF CHEMOTHERAPY.   prochlorperazine 10 MG tablet Commonly known as:  COMPAZINE Take 1 tablet (10 mg total) by mouth every 6 (six) hours as needed for nausea or vomiting.  valACYclovir 500 MG tablet Commonly known as:  VALTREX TAKE 1 TABLET BY MOUTH ONCE DAILY.   vinCRIStine 2 mg in sodium chloride 0.9 % 50 mL Inject 2 mg into the vein every 21 ( twenty-one) days.   vitamin B-12 1000 MCG tablet Commonly known as:  CYANOCOBALAMIN Take 1 tablet (1,000 mcg total) by mouth daily.   vitamin C 500 MG tablet Commonly known as:  ASCORBIC ACID Take 500 mg by mouth 2 (two) times daily.   zolpidem 10 MG tablet Commonly known as:  AMBIEN Take 1 tablet (10 mg total) by mouth at bedtime as needed for sleep.       Allergies  Allergen Reactions  . Doxycycline Anaphylaxis  . Penicillins Anaphylaxis    Did it involve swelling of the face/tongue/throat, SOB, or low BP? Yes-SOB Did it involve sudden or severe rash/hives, skin peeling, or any reaction on the inside of your mouth or nose? Unknown Did you need to seek medical attention at a hospital or doctor's office? Yes When did it last happen?Over 10 years ago If all above answers are "NO", may proceed with cephalosporin use.   . Levofloxacin Nausea And Vomiting  . Sulfa Antibiotics Itching and Rash     Procedures/Studies:    Dg Chest Portable 1 View  Result Date: 05/28/2018 CLINICAL DATA:  Neutropenic patient with productive cough and fever for 2-3 days. EXAM: PORTABLE CHEST 1 VIEW COMPARISON:  Single-view of the chest 04/01/2018. PA and lateral chest  03/18/2018. FINDINGS: Lungs are clear. Heart size is normal. Port-A-Cath noted. No pneumothorax or pleural fluid. No bony abnormality. IMPRESSION: Negative chest. Electronically Signed   By: Inge Rise M.D.   On: 05/28/2018 17:51     The results of significant diagnostics from this hospitalization (including imaging, microbiology, ancillary and laboratory) are listed below for reference.     Microbiology: Recent Results (from the past 240 hour(s))  Culture, blood (routine x 2)     Status: None (Preliminary result)   Collection Time: 05/28/18  1:19 PM  Result Value Ref Range Status   Specimen Description BLOOD RIGHT ANTECUBITAL  Final   Special Requests   Final    BOTTLES DRAWN AEROBIC AND ANAEROBIC Blood Culture adequate volume   Culture   Final    NO GROWTH 2 DAYS Performed at [facility], [address], [place], [place] [address]    Report Status PENDING  Incomplete  Culture, blood (Routine X 2) w Reflex to ID Panel     Status: None (Preliminary result)   Collection Time: 05/28/18  1:20 PM  Result Value Ref Range Status   Specimen Description BLOOD LEFT ANTECUBITAL  Final   Special Requests   Final    BOTTLES DRAWN AEROBIC AND ANAEROBIC Blood Culture adequate volume   Culture   Final    NO GROWTH 2 DAYS Performed at [facility], [address], [place], [place] [address]    Report Status PENDING  Incomplete  SARS Coronavirus 2 (CEPHEID- Performed in [place] hospital lab), Hosp Order     Status: None   Collection Time: 05/28/18  5:26 PM  Result Value Ref Range Status   SARS Coronavirus 2 NEGATIVE NEGATIVE Final    Comment: (NOTE) If result is NEGATIVE SARS-CoV-2 target nucleic acids are NOT DETECTED. The SARS-CoV-2 RNA is generally detectable in upper and lower  respiratory specimens during the acute phase of infection. The lowest  concentration of SARS-CoV-2 viral copies this assay can detect is 250  copies /  mL. A negative result does not preclude SARS-CoV-2  infection  and should not be used as the sole basis for treatment or other  patient management decisions.  A negative result may occur with  improper specimen collection / handling, submission of specimen other  than nasopharyngeal swab, presence of viral mutation(s) within the  areas targeted by this assay, and inadequate number of viral copies  (<250 copies / mL). A negative result must be combined with clinical  observations, patient history, and epidemiological information. If result is POSITIVE SARS-CoV-2 target nucleic acids are DETECTED. The SARS-CoV-2 RNA is generally detectable in upper and lower  respiratory specimens dur ing the acute phase of infection.  Positive  results are indicative of active infection with SARS-CoV-2.  Clinical  correlation with patient history and other diagnostic information is  necessary to determine patient infection status.  Positive results do  not rule out bacterial infection or co-infection with other viruses. If result is PRESUMPTIVE POSTIVE SARS-CoV-2 nucleic acids MAY BE PRESENT.   A presumptive positive result was obtained on the submitted specimen  and confirmed on repeat testing.  While 2019 novel coronavirus  (SARS-CoV-2) nucleic acids may be present in the submitted sample  additional confirmatory testing may be necessary for epidemiological  and / or clinical management purposes  to differentiate between  SARS-CoV-2 and other Sarbecovirus currently known to infect humans.  If clinically indicated additional testing with an alternate test  methodology [phone]) is advised. The SARS-CoV-2 RNA is generally  detectable in upper and lower respiratory sp ecimens during the acute  phase of infection. The expected result is Negative. Fact Sheet for Patients:  StrictlyIdeas.no Fact Sheet for Healthcare Providers: BankingDealers.co.za This test is not yet approved or cleared by the [place]  FDA and has been authorized for detection and/or diagnosis of SARS-CoV-2 by FDA under an Emergency Use Authorization (EUA).  This EUA will remain in effect (meaning this test can be used) for the duration of the COVID-19 declaration under Section 564(b)(1) of the Act, 21 U.S.C. section 360bbb-3(b)(1), unless the authorization is terminated or revoked sooner. Performed at [facility], [address], [place], [place] [address]   Respiratory Panel by PCR     Status: Abnormal   Collection Time: 05/28/18 11:13 PM  Result Value Ref Range Status   Adenovirus NOT DETECTED NOT DETECTED Final   Coronavirus 229E NOT DETECTED NOT DETECTED Final    Comment: (NOTE) The Coronavirus on the Respiratory Panel, DOES NOT test for the novel  Coronavirus (2019 nCoV)    Coronavirus HKU1 NOT DETECTED NOT DETECTED Final   Coronavirus NL63 NOT DETECTED NOT DETECTED Final   Coronavirus OC43 NOT DETECTED NOT DETECTED Final   Metapneumovirus NOT DETECTED NOT DETECTED Final   Rhinovirus / Enterovirus DETECTED (A) NOT DETECTED Final   Influenza A NOT DETECTED NOT DETECTED Final   Influenza B NOT DETECTED NOT DETECTED Final   Parainfluenza Virus 1 NOT DETECTED NOT DETECTED Final   Parainfluenza Virus 2 NOT DETECTED NOT DETECTED Final   Parainfluenza Virus 3 NOT DETECTED NOT DETECTED Final   Parainfluenza Virus 4 NOT DETECTED NOT DETECTED Final   Respiratory Syncytial Virus NOT DETECTED NOT DETECTED Final   Bordetella pertussis NOT DETECTED NOT DETECTED Final   Chlamydophila pneumoniae NOT DETECTED NOT DETECTED Final   Mycoplasma pneumoniae NOT DETECTED NOT DETECTED Final    Comment: Performed at [facility], [place] [address] [place], [place] [address]  MRSA PCR Screening  Status: None   Collection Time: 05/28/18 11:13 PM  Result Value Ref Range Status   MRSA by PCR NEGATIVE NEGATIVE Final    Comment:        The GeneXpert MRSA Assay (FDA approved for NASAL specimens only), is one component of  a comprehensive MRSA colonization surveillance program. It is not intended to diagnose MRSA infection nor to guide or monitor treatment for MRSA infections. Performed at [facility], [address] [place], [place] [address]   C difficile quick scan w PCR reflex     Status: None   Collection Time: 05/29/18  6:55 PM  Result Value Ref Range Status   C Diff antigen NEGATIVE NEGATIVE Final   C Diff toxin NEGATIVE NEGATIVE Final   C Diff interpretation No C. difficile detected.  Final    Comment: Performed at [facility], [address], [place], [place] [address]     Labs: BNP (last 3 results) Recent Labs    01/18/18 1218  BNP 02.5   Basic Metabolic Panel: Recent Labs  Lab 05/26/18 0951 05/28/18 1320 05/29/18 0534 05/29/18 0830 05/30/18 0622  NA 138 128* 136  --  139  K 4.9 3.6 3.5  --  2.2*  CL 107 95* 107  --  111  CO2 21* 22 21*  --  20*  GLUCOSE 451* 456* 354*  --  170*  BUN 40* 32* 30*  --  17  CREATININE 0.97 0.93 0.84  --  0.61  CALCIUM 9.2 8.6* 8.3*  --  8.0*  MG 2.0 1.6*  --  1.8  --    Liver Function Tests: Recent Labs  Lab 05/26/18 0951 05/28/18 1320  AST 13* 10*  ALT 17 13  ALKPHOS 74 59  BILITOT 0.8 0.9  PROT 5.6* 5.5*  ALBUMIN 3.4* 3.0*   No results for input(s): LIPASE, AMYLASE in the last 168 hours. No results for input(s): AMMONIA in the last 168 hours. CBC: Recent Labs  Lab 05/26/18 0951 05/28/18 1320 05/29/18 0534 05/30/18 0622  WBC 1.6* 0.2* 0.2* 0.3*  NEUTROABS 0.9* 0.0* 0.0* 0.1*  HGB 9.8* 8.6* 6.9* 7.4*  HCT 30.0* 25.0* 20.1* 21.2*  MCV 106.0* 100.8* 102.6* 93.8  PLT 24* 8* 43* 22*   Cardiac Enzymes: No results for input(s): CKTOTAL, CKMB, CKMBINDEX, TROPONINI in the last 168 hours. BNP: Invalid input(s): POCBNP CBG: Recent Labs  Lab 05/29/18 0821 05/29/18 1108 05/29/18 1611 05/29/18 2102 05/30/18 0739  GLUCAP 308* 318* 141* 170* 172*   D-Dimer No results for input(s): DDIMER in the last 72 hours. Hgb  A1c Recent Labs    05/29/18 0715  HGBA1C 8.5*   Lipid Profile No results for input(s): CHOL, HDL, LDLCALC, TRIG, CHOLHDL, LDLDIRECT in the last 72 hours. Thyroid function studies No results for input(s): TSH, T4TOTAL, T3FREE, THYROIDAB in the last 72 hours.  Invalid input(s): FREET3 Anemia work up No results for input(s): VITAMINB12, FOLATE, FERRITIN, TIBC, IRON, RETICCTPCT in the last 72 hours. Urinalysis    Component Value Date/Time   COLORURINE YELLOW 05/28/2018 2336   APPEARANCEUR CLEAR 05/28/2018 2336   LABSPEC 1.013 05/28/2018 2336   PHURINE 5.0 05/28/2018 2336   GLUCOSEU >=500 (A) 05/28/2018 2336   HGBUR NEGATIVE 05/28/2018 2336   BILIRUBINUR NEGATIVE 05/28/2018 2336   KETONESUR NEGATIVE 05/28/2018 2336   PROTEINUR 30 (A) 05/28/2018 2336   NITRITE NEGATIVE 05/28/2018 2336   LEUKOCYTESUR NEGATIVE 05/28/2018 2336   Sepsis Labs Invalid input(s): PROCALCITONIN,  WBC,  LACTICIDVEN Microbiology Recent Results (from  the past 240 hour(s))  Culture, blood (routine x 2)     Status: None (Preliminary result)   Collection Time: 05/28/18  1:19 PM  Result Value Ref Range Status   Specimen Description BLOOD RIGHT ANTECUBITAL  Final   Special Requests   Final    BOTTLES DRAWN AEROBIC AND ANAEROBIC Blood Culture adequate volume   Culture   Final    NO GROWTH 2 DAYS Performed at [facility], [address], [place], [place] [address]    Report Status PENDING  Incomplete  Culture, blood (Routine X 2) w Reflex to ID Panel     Status: None (Preliminary result)   Collection Time: 05/28/18  1:20 PM  Result Value Ref Range Status   Specimen Description BLOOD LEFT ANTECUBITAL  Final   Special Requests   Final    BOTTLES DRAWN AEROBIC AND ANAEROBIC Blood Culture adequate volume   Culture   Final    NO GROWTH 2 DAYS Performed at [facility], [address] [place], [place] [address]    Report Status PENDING  Incomplete  SARS Coronavirus 2 (CEPHEID- Performed in [place]  hospital lab), Hosp Order     Status: None   Collection Time: 05/28/18  5:26 PM  Result Value Ref Range Status   SARS Coronavirus 2 NEGATIVE NEGATIVE Final    Comment: (NOTE) If result is NEGATIVE SARS-CoV-2 target nucleic acids are NOT DETECTED. The SARS-CoV-2 RNA is generally detectable in upper and lower  respiratory specimens during the acute phase of infection. The lowest  concentration of SARS-CoV-2 viral copies this assay can detect is 250  copies / mL. A negative result does not preclude SARS-CoV-2 infection  and should not be used as the sole basis for treatment or other  patient management decisions.  A negative result may occur with  improper specimen collection / handling, submission of specimen other  than nasopharyngeal swab, presence of viral mutation(s) within the  areas targeted by this assay, and inadequate number of viral copies  (<250 copies / mL). A negative result must be combined with clinical  observations, patient history, and epidemiological information. If result is POSITIVE SARS-CoV-2 target nucleic acids are DETECTED. The SARS-CoV-2 RNA is generally detectable in upper and lower  respiratory specimens dur ing the acute phase of infection.  Positive  results are indicative of active infection with SARS-CoV-2.  Clinical  correlation with patient history and other diagnostic information is  necessary to determine patient infection status.  Positive results do  not rule out bacterial infection or co-infection with other viruses. If result is PRESUMPTIVE POSTIVE SARS-CoV-2 nucleic acids MAY BE PRESENT.   A presumptive positive result was obtained on the submitted specimen  and confirmed on repeat testing.  While 2019 novel coronavirus  (SARS-CoV-2) nucleic acids may be present in the submitted sample  additional confirmatory testing may be necessary for epidemiological  and / or clinical management purposes  to differentiate between  SARS-CoV-2 and other  Sarbecovirus currently known to infect humans.  If clinically indicated additional testing with an alternate test  methodology [phone]) is advised. The SARS-CoV-2 RNA is generally  detectable in upper and lower respiratory sp ecimens during the acute  phase of infection. The expected result is Negative. Fact Sheet for Patients:  StrictlyIdeas.no Fact Sheet for Healthcare Providers: BankingDealers.co.za This test is not yet approved or cleared by the [place] FDA and has been authorized for detection and/or diagnosis of SARS-CoV-2 by FDA under an Emergency Use Authorization (EUA).  This EUA will remain in effect (meaning this test can be used) for the duration of the COVID-19 declaration under Section 564(b)(1) of the Act, 21 U.S.C. section 360bbb-3(b)(1), unless the authorization is terminated or revoked sooner. Performed at [facility], [address] [place], [place] [address]   Respiratory Panel by PCR     Status: Abnormal   Collection Time: 05/28/18 11:13 PM  Result Value Ref Range Status   Adenovirus NOT DETECTED NOT DETECTED Final   Coronavirus 229E NOT DETECTED NOT DETECTED Final    Comment: (NOTE) The Coronavirus on the Respiratory Panel, DOES NOT test for the novel  Coronavirus (2019 nCoV)    Coronavirus HKU1 NOT DETECTED NOT DETECTED Final   Coronavirus NL63 NOT DETECTED NOT DETECTED Final   Coronavirus OC43 NOT DETECTED NOT DETECTED Final   Metapneumovirus NOT DETECTED NOT DETECTED Final   Rhinovirus / Enterovirus DETECTED (A) NOT DETECTED Final   Influenza A NOT DETECTED NOT DETECTED Final   Influenza B NOT DETECTED NOT DETECTED Final   Parainfluenza Virus 1 NOT DETECTED NOT DETECTED Final   Parainfluenza Virus 2 NOT DETECTED NOT DETECTED Final   Parainfluenza Virus 3 NOT DETECTED NOT DETECTED Final   Parainfluenza Virus 4 NOT DETECTED NOT DETECTED Final   Respiratory Syncytial Virus NOT DETECTED NOT DETECTED  Final   Bordetella pertussis NOT DETECTED NOT DETECTED Final   Chlamydophila pneumoniae NOT DETECTED NOT DETECTED Final   Mycoplasma pneumoniae NOT DETECTED NOT DETECTED Final    Comment: Performed at [facility], [place] [address], [place], [place] [address]  MRSA PCR Screening     Status: None   Collection Time: 05/28/18 11:13 PM  Result Value Ref Range Status   MRSA by PCR NEGATIVE NEGATIVE Final    Comment:        The GeneXpert MRSA Assay (FDA approved for NASAL specimens only), is one component of a comprehensive MRSA colonization surveillance program. It is not intended to diagnose MRSA infection nor to guide or monitor treatment for MRSA infections. Performed at [facility], [address] [place], [place] [address]   C difficile quick scan w PCR reflex     Status: None   Collection Time: 05/29/18  6:55 PM  Result Value Ref Range Status   C Diff antigen NEGATIVE NEGATIVE Final   C Diff toxin NEGATIVE NEGATIVE Final   C Diff interpretation No C. difficile detected.  Final    Comment: Performed at [facility], [address] [place], [place] [address]     Time coordinating discharge in minutes: 65  SIGNED:   Debbe Odea, MD  [facility] 05/30/2018, 11:08 AM Pager   If 7PM-7AM, please contact night-coverage [email or web address] Password [email or web address]

## 2018-05-30 NOTE — Progress Notes (Signed)
Patient refused insulin, IV antibiotic and some po meds, she states she's going home and will take her regular then, patient has been educated on her medication that she's refusing to take, she verbalized understanding, requesting for her IV and port be de accessed at this time

## 2018-05-30 NOTE — Discharge Instructions (Signed)
You must have the following blood work done on Tuesday: CBC, Bmet and Magnesium.  You were cared for by a hospitalist during your hospital stay. If you have any questions about your discharge medications or the care you received while you were in the hospital after you are discharged, you can call the unit and asked to speak with the hospitalist on call if the hospitalist that took care of you is not available. Once you are discharged, your primary care physician will handle any further medical issues.   Please note that NO REFILLS for any discharge medications will be authorized once you are discharged, as it is imperative that you return to your primary care physician (or establish a relationship with a primary care physician if you do not have one) for your aftercare needs so that they can reassess your need for medications and monitor your lab values.  Please take all your medications with you for your next visit with your Primary MD. Please ask your Primary MD to get all Hospital records sent to his/her office. Please request your Primary MD to go over all hospital test results at the follow up.   If you experience worsening of your admission symptoms, develop shortness of breath, chest pain, suicidal or homicidal thoughts or a life threatening emergency, you must seek medical attention immediately by calling 911 or calling your MD.   Dennis Bast must read the complete instructions/literature along with all the possible adverse reactions/side effects for all the medicines you take including new medications that have been prescribed to you. Take new medicines after you have completely understood and accpet all the possible adverse reactions/side effects.    Do not drive when taking pain medications or sedatives.     Do not take more than prescribed Pain, Sleep and Anxiety Medications   If you have smoked or chewed Tobacco in the last 2 yrs please stop. Stop any regular alcohol  and or recreational drug  use.   Wear Seat belts while driving.

## 2018-05-30 NOTE — Progress Notes (Signed)
Nsg Discharge Note  Admit Date:  05/28/2018 Discharge date: 05/30/2018   Collene Gobble to be D/C'd Home per MD order.  AVS completed.  Copy for chart, and copy for patient signed, and dated. Patient/caregiver able to verbalize understanding.  Discharge Medication: Allergies as of 05/30/2018      Reactions   Doxycycline Anaphylaxis   Penicillins Anaphylaxis   Did it involve swelling of the face/tongue/throat, SOB, or low BP? Yes-SOB Did it involve sudden or severe rash/hives, skin peeling, or any reaction on the inside of your mouth or nose? Unknown Did you need to seek medical attention at a hospital or doctor's office? Yes When did it last happen?Over 10 years ago If all above answers are "NO", may proceed with cephalosporin use.   Levofloxacin Nausea And Vomiting   Sulfa Antibiotics Itching, Rash      Medication List    STOP taking these medications   triamterene-hydrochlorothiazide 37.5-25 MG tablet Commonly known as:  MAXZIDE-25     TAKE these medications   ADRIAMYCIN IV Inject 84 mg into the vein every 21 ( twenty-one) days.   amoxicillin-clavulanate 875-125 MG tablet Commonly known as:  AUGMENTIN Take 1 tablet by mouth 2 (two) times daily.   atorvastatin 20 MG tablet Commonly known as:  LIPITOR Take 20 mg by mouth every morning.   Calcium 500+D High Potency 500-400 MG-UNIT tablet Generic drug:  calcium-vitamin D Take 1 tablet by mouth 2 (two) times daily.   cyclophosphamide in sodium chloride 0.9 % 250 mL Inject 1,260 mg into the vein every 21 ( twenty-one) days.   ETOPOSIDE IV Inject 170 mg into the vein every 21 ( twenty-one) days. Days 1-3   feeding supplement (GLUCERNA SHAKE) Liqd Take 237 mLs by mouth 3 (three) times daily between meals.   furosemide 20 MG tablet Commonly known as:  LASIX Take 1 tablet (20 mg total) by mouth daily as needed.   GLUCOSAMINE 1500 COMPLEX PO Take 1 capsule by mouth 2 (two) times daily.   levothyroxine 75 MCG  tablet Commonly known as:  SYNTHROID Take 75 mcg by mouth daily before breakfast.   lidocaine-prilocaine cream Commonly known as:  EMLA Apply 1 application topically as needed.   loperamide 2 MG capsule Commonly known as:  IMODIUM Take 2 mg by mouth as needed for diarrhea or loose stools.   magic mouthwash Soln Take 5 mLs by mouth 3 (three) times daily as needed for mouth pain. With lidocaine 1% 1:1 solution   magnesium oxide 400 (241.3 Mg) MG tablet Commonly known as:  MAG-OX TAKE 1 TABLET BY MOUTH TWICE DAILY   metFORMIN 1000 MG tablet Commonly known as:  GLUCOPHAGE Take 1,000 mg by mouth 2 (two) times daily with a meal.   Neulasta 6 MG/0.6ML injection Generic drug:  pegfilgrastim Inject 6 mg into the skin See admin instructions. To be administered 3 days after chemo therapy treatment   potassium chloride 10 MEQ tablet Commonly known as:  K-DUR Take 40 meq today and then 10 meq daily.   predniSONE 20 MG tablet Commonly known as:  DELTASONE TAKE 5 TABLETS BY MOUTH ONCE DAILY ON DAYS 1-5 OF CHEMOTHERAPY.   prochlorperazine 10 MG tablet Commonly known as:  COMPAZINE Take 1 tablet (10 mg total) by mouth every 6 (six) hours as needed for nausea or vomiting.   valACYclovir 500 MG tablet Commonly known as:  VALTREX TAKE 1 TABLET BY MOUTH ONCE DAILY.   vinCRIStine 2 mg in sodium chloride 0.9 % 50  mL Inject 2 mg into the vein every 21 ( twenty-one) days.   vitamin B-12 1000 MCG tablet Commonly known as:  CYANOCOBALAMIN Take 1 tablet (1,000 mcg total) by mouth daily.   vitamin C 500 MG tablet Commonly known as:  ASCORBIC ACID Take 500 mg by mouth 2 (two) times daily.   zolpidem 10 MG tablet Commonly known as:  AMBIEN Take 1 tablet (10 mg total) by mouth at bedtime as needed for sleep.       Discharge Assessment: Vitals:   05/30/18 0540 05/30/18 0817  BP: 139/85   Pulse: 96   Resp:    Temp: 98.7 F (37.1 C)   SpO2: 100% 100%   Skin clean, dry and intact  without evidence of skin break down, no evidence of skin tears noted. IV catheter discontinued intact. Site without signs and symptoms of complications - no redness or edema noted at insertion site, patient denies c/o pain - only slight tenderness at site.  Dressing with slight pressure applied.  D/c Instructions-Education: Discharge instructions given to patient with verbalized understanding. D/c education completed with patient/family including follow up instructions, medication list, d/c activities limitations if indicated, with other d/c instructions as indicated by MD - patient able to verbalize understanding, all questions fully answered. Patient instructed to return to ED, call 911, or call MD for any changes in condition.  Patient escorted via [place], and D/C home via private auto.  Dustyn Armbrister C, RN 05/30/2018 11:22 AM

## 2018-06-01 ENCOUNTER — Other Ambulatory Visit (HOSPITAL_COMMUNITY): Payer: Self-pay | Admitting: *Deleted

## 2018-06-01 ENCOUNTER — Inpatient Hospital Stay (HOSPITAL_COMMUNITY): Payer: Commercial Managed Care - PPO

## 2018-06-01 ENCOUNTER — Encounter (HOSPITAL_COMMUNITY): Payer: Self-pay | Admitting: Hematology

## 2018-06-01 ENCOUNTER — Inpatient Hospital Stay (HOSPITAL_BASED_OUTPATIENT_CLINIC_OR_DEPARTMENT_OTHER): Payer: Commercial Managed Care - PPO | Admitting: Hematology

## 2018-06-01 ENCOUNTER — Other Ambulatory Visit: Payer: Self-pay

## 2018-06-01 DIAGNOSIS — C8447 Peripheral T-cell lymphoma, not classified, spleen: Secondary | ICD-10-CM

## 2018-06-01 DIAGNOSIS — Z5111 Encounter for antineoplastic chemotherapy: Secondary | ICD-10-CM | POA: Diagnosis not present

## 2018-06-01 DIAGNOSIS — I10 Essential (primary) hypertension: Secondary | ICD-10-CM | POA: Diagnosis not present

## 2018-06-01 DIAGNOSIS — R05 Cough: Secondary | ICD-10-CM | POA: Diagnosis not present

## 2018-06-01 DIAGNOSIS — Z79899 Other long term (current) drug therapy: Secondary | ICD-10-CM

## 2018-06-01 DIAGNOSIS — Z85828 Personal history of other malignant neoplasm of skin: Secondary | ICD-10-CM | POA: Diagnosis not present

## 2018-06-01 DIAGNOSIS — R161 Splenomegaly, not elsewhere classified: Secondary | ICD-10-CM

## 2018-06-01 DIAGNOSIS — E119 Type 2 diabetes mellitus without complications: Secondary | ICD-10-CM | POA: Diagnosis not present

## 2018-06-01 DIAGNOSIS — E876 Hypokalemia: Secondary | ICD-10-CM

## 2018-06-01 DIAGNOSIS — E039 Hypothyroidism, unspecified: Secondary | ICD-10-CM | POA: Diagnosis not present

## 2018-06-01 DIAGNOSIS — Z7984 Long term (current) use of oral hypoglycemic drugs: Secondary | ICD-10-CM | POA: Diagnosis not present

## 2018-06-01 DIAGNOSIS — R112 Nausea with vomiting, unspecified: Secondary | ICD-10-CM | POA: Diagnosis not present

## 2018-06-01 LAB — COMPREHENSIVE METABOLIC PANEL
ALT: 22 U/L (ref 0–44)
AST: 15 U/L (ref 15–41)
Albumin: 2.8 g/dL — ABNORMAL LOW (ref 3.5–5.0)
Alkaline Phosphatase: 100 U/L (ref 38–126)
Anion gap: 11 (ref 5–15)
BUN: 18 mg/dL (ref 6–20)
CO2: 24 mmol/L (ref 22–32)
Calcium: 8.8 mg/dL — ABNORMAL LOW (ref 8.9–10.3)
Chloride: 110 mmol/L (ref 98–111)
Creatinine, Ser: 0.67 mg/dL (ref 0.44–1.00)
GFR calc Af Amer: >60 mL/min (ref >60)
GFR calc non Af Amer: >60 mL/min (ref >60)
Glucose, Bld: 233 mg/dL — ABNORMAL HIGH (ref 70–99)
Potassium: 3 mmol/L — ABNORMAL LOW (ref 3.5–5.1)
Sodium: 145 mmol/L (ref 135–145)
Total Bilirubin: 0.5 mg/dL (ref 0.3–1.2)
Total Protein: 5.5 g/dL — ABNORMAL LOW (ref 6.5–8.1)

## 2018-06-01 LAB — CBC WITH DIFFERENTIAL/PLATELET
Abs Immature Granulocytes: 0.06 10*3/uL (ref 0.00–0.07)
Basophils Absolute: 0 10*3/uL (ref 0.0–0.1)
Basophils Relative: 1 %
Eosinophils Absolute: 0 10*3/uL (ref 0.0–0.5)
Eosinophils Relative: 1 %
HCT: 23.9 % — ABNORMAL LOW (ref 36.0–46.0)
Hemoglobin: 8 g/dL — ABNORMAL LOW (ref 12.0–15.0)
Immature Granulocytes: 7 %
Lymphocytes Relative: 17 %
Lymphs Abs: 0.2 10*3/uL — ABNORMAL LOW (ref 0.7–4.0)
MCH: 32.4 pg (ref 26.0–34.0)
MCHC: 33.5 g/dL (ref 30.0–36.0)
MCV: 96.8 fL (ref 80.0–100.0)
Monocytes Absolute: 0.1 10*3/uL (ref 0.1–1.0)
Monocytes Relative: 11 %
Neutro Abs: 0.6 10*3/uL — ABNORMAL LOW (ref 1.7–7.7)
Neutrophils Relative %: 63 %
Platelets: 16 10*3/uL — CL (ref 150–400)
RBC: 2.47 MIL/uL — ABNORMAL LOW (ref 3.87–5.11)
RDW: 20.5 % — ABNORMAL HIGH (ref 11.5–15.5)
WBC: 0.9 10*3/uL — CL (ref 4.0–10.5)
nRBC: 0 % (ref 0.0–0.2)

## 2018-06-01 LAB — SAMPLE TO BLOOD BANK

## 2018-06-01 LAB — MAGNESIUM: Magnesium: 1.6 mg/dL — ABNORMAL LOW (ref 1.7–2.4)

## 2018-06-01 NOTE — Assessment & Plan Note (Signed)
1.  PTCL, NOS: -Presentation with pancytopenia, night sweats and fevers for last 1 to 2 months. - CT CAP shows splenomegaly with no lymphadenopathy. 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  T cells do not express TDT, CD1a, CD10, CD15, CD30, CD34, CD56, CD 117 or CD99.  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 - Prognostic index for PTCL (PIT) has 2 positive factors including serum LDH above normal and bone marrow involvement. - She is not a candidate for brentuximab-based regimen as CD30 was negative. -We talked about first-line therapy with CHOP-based regimen.  I have also talked to her about CHOEP regimen which has shown to be helpful for patients less than 60 and normal LDH. - We will obtain a baseline PET CT scan. - She has a PICC line placed for chemotherapy administration. -2D echo on 01/24/2018 shows ejection fraction of 65 to 70%. -PET/CT scan on 02/02/2018 shows splenomegaly with no hypermetabolism.  No enlarged or hypermetabolic adenopathy in the neck chest abdomen or pelvis. - 4 cycles of CHOEP from 01/28/2018 through 04/01/2018. -She met with Dr. Minna Antis at [facility] for consolidation transplant. -PET/CT scan on 04/13/2018 shows no FDG mass or adenopathy.  Decrease in size of the spleen measuring 13 cm, previously 16 cm.  Considerable heterogeneous uptake throughout the axial and appendicular skeleton most notably in the thoracic and lumbar spine.  I think this is from G-CSF. -Bone marrow biopsy on 04/16/2018 shows atypical T-cell proliferation, persistent.  The extensive nature of T-cell infiltrate is concerning for a neoplastic T-cell lymphoproliferative process.  T-cell receptor PCR is polyclonal.  Additional analysis for expression of BCL 6, CXCL13 and PD1 were sent. -I have also called and talked to Dr. Minna Antis about pathology report.  As there is improvement in  her blood counts and decreasing spleen volume, it is highly likely that it is a malignant process.  Hence she was recommended to proceed with her final cycle.   -6 cycles of CHOEP from 01/28/2018 through 05/20/2018. - Final pathology of bone marrow biopsy: pending .  - She developed neutropenic fever on 05/29/2018. Work up was positive for rhinovirus. She was started on Augmentin 875 BID.  - She denies any further fevers or signs and symptoms of infection. Lab work has improved WBC: 0.9, Hgb: 8.0, Plt: 16; ANC: 600 - She will follow up with Dr. Minna Antis on May 19th at [facility] for consideration of allogeneic transplant. She will RTC in 4 weeks. We will plan to repeat scans at that time, if not ordered at [facility].   2.  Hypomagnesemia: -Magnesium is 1.6 -She will continue magnesium 400 BID  3. Hypokalemia: - Potassium 3.0 - Pt was started on Klor-Con 20 mEq daily in the ER. Continue to monitor.

## 2018-06-01 NOTE — Progress Notes (Signed)
Critical value received.  WBC 0.9 PLT 16  Primary RN notified.

## 2018-06-01 NOTE — Progress Notes (Signed)
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[place])    01/28/2018 -  Chemotherapy    The patient had DOXOrubicin (ADRIAMYCIN) chemo injection 50 mg, 30 mg/m2 = 50 mg (60 % of original dose 50 mg/m2), Intravenous,  Once, 6 of 8 cycles Dose modification: 30 mg/m2 (60 % of original dose 50 mg/m2, Cycle 1, Reason: Other (see comments), Comment: elevated bilirubin) Administration: 50 mg (01/28/2018), 76 mg (03/11/2018), 76 mg (02/18/2018), 76 mg (04/01/2018), 76 mg (04/21/2018), 76 mg (05/20/2018) palonosetron (ALOXI) injection 0.25 mg, 0.25 mg, Intravenous,  Once, 6 of 8 cycles Administration: 0.25 mg (01/28/2018), 0.25 mg (02/18/2018), 0.25 mg (03/11/2018), 0.25 mg (04/01/2018), 0.25 mg (04/21/2018), 0.25 mg (05/20/2018) pegfilgrastim (NEULASTA ONPRO KIT) injection 6 mg, 6 mg, Subcutaneous, Once, 6 of 8 cycles Administration: 6 mg (02/20/2018), 6 mg (04/03/2018), 6 mg (04/23/2018), 6 mg (05/22/2018) vinCRIStine (ONCOVIN) 1 mg in sodium chloride 0.9 % 50 mL chemo infusion, 1 mg (50 % of original dose 2 mg), Intravenous,  Once, 6 of 8 cycles Dose modification: 1 mg (50 % of original dose 2 mg, Cycle 1, Reason: Other (see comments), Comment: liver dysfunctyion) Administration: 1 mg (01/28/2018), 2 mg (03/11/2018), 2 mg (02/18/2018), 2 mg (04/01/2018), 2 mg (04/21/2018), 2 mg (05/20/2018) cyclophosphamide (CYTOXAN) 1,260 mg in sodium chloride 0.9 % 250 mL chemo infusion, 750 mg/m2 = 1,260 mg, Intravenous,  Once, 6 of 8 cycles Administration: 1,260 mg (01/28/2018), 1,140 mg (03/11/2018), 1,140 mg (02/18/2018), 1,140 mg (04/01/2018), 1,140 mg (04/21/2018), 1,140 mg  (05/20/2018) etoposide (VEPESID) 130 mg in sodium chloride 0.9 % 500 mL chemo infusion, 75 mg/m2 = 130 mg (75 % of original dose 100 mg/m2), Intravenous,  Once, 6 of 8 cycles Dose modification: 75 mg/m2 (75 % of original dose 100 mg/m2, Cycle 1, Reason: Change in LFTs), 75 mg/m2 (75 % of original dose 100 mg/m2, Cycle 1, Reason: Change in LFTs) Administration: 130 mg (01/28/2018), 130 mg (01/29/2018), 130 mg (01/30/2018), 150 mg (02/19/2018), 150 mg (02/20/2018), 150 mg (03/12/2018), 150 mg (03/13/2018), 150 mg (04/02/2018), 150 mg (04/03/2018), 150 mg (04/22/2018), 150 mg (04/23/2018), 150 mg (05/21/2018), 150 mg (05/22/2018), 150 mg (03/11/2018), 150 mg (02/18/2018), 150 mg (04/01/2018), 150 mg (04/21/2018), 150 mg (05/20/2018) fosaprepitant (EMEND) 150 mg, dexamethasone (DECADRON) 12 mg in sodium chloride 0.9 % 145 mL IVPB, , Intravenous,  Once, 6 of 6 cycles Administration:  (01/28/2018),  (02/18/2018),  (03/11/2018),  (04/01/2018),  (04/21/2018),  (05/20/2018)  for chemotherapy treatment.       CANCER STAGING: Cancer Staging No matching staging information was found for the patient.   INTERVAL HISTORY:  Kristin Good 58 y.o. female returns for routine follow-up from recent hospitalization. She states that she new mouth sores and trouble swallowing. She states that she feels better but still not 100% Denies any nausea, vomiting, or diarrhea. Denies any new pains. Had not noticed any recent bleeding such as epistaxis, hematuria or hematochezia. Denies recent chest pain on exertion, shortness of breath on minimal exertion, pre-syncopal episodes, or palpitations. Denies any numbness or tingling in hands. Denies any recent fevers, infections, or recent hospitalizations. Patient reports appetite at  75% and energy level at 75%.    REVIEW OF SYSTEMS:  Review of Systems  HENT:   Positive for mouth sores and trouble swallowing.   Neurological: Positive for numbness.     PAST MEDICAL/SURGICAL HISTORY:  Past Medical History:  Diagnosis  Date   Diabetes ([place]) 01/20/2018   HTN (hypertension)    Hypothyroidism    Leukemia ([place])    Atypical T cell lymphoma   Past Surgical History:  Procedure Laterality Date   COLONOSCOPY  07/27/2010   Procedure: COLONOSCOPY;  Surgeon: Kristin Peng, MD;  Location: [facility];  Service: Endoscopy;  Laterality: N/A;   infertility surgery     MOHS SURGERY     basal cell carcinoma   PORTACATH PLACEMENT Left 04/01/2018   Procedure: INSERTION PORT-A-CATH;  Surgeon: Kristin Signs, MD;  Location: [facility] ORS;  Service: General;  Laterality: Left;   REDUCTION MAMMAPLASTY Bilateral 2002     SOCIAL HISTORY:  Social History   Socioeconomic History   Marital status: Married    Spouse name: Not on file   Number of children: 0   Years of education: Not on file   Highest education level: Not on file  Occupational History   Occupation: [employer]    Comment: Nurse  Social Needs   Financial resource strain: Not on file   Food insecurity:    Worry: Not on file    Inability: Not on file   Transportation needs:    Medical: Not on file    Non-medical: Not on file  Tobacco Use   Smoking status: Never Smoker   Smokeless tobacco: Never Used  Substance and Sexual Activity   Alcohol use: Yes    Comment: once or twice a year   Drug use: No   Sexual activity: Not on file  Lifestyle   Physical activity:    Days per week: Not on file    Minutes per session: Not on file   Stress: Not on file  Relationships   Social connections:    Talks on phone: Not on file    Gets together: Not on file    Attends religious service: Not on file    Active member of club or organization: Not on file    Attends meetings of clubs or organizations: Not on file    Relationship status: Not on file   Intimate partner violence:    Fear of current or ex partner: Not on file    Emotionally abused: Not on file    Physically abused: Not on file    Forced sexual activity: Not on  file  Other Topics Concern   Not on file  Social History Narrative   Not on file    FAMILY HISTORY:  Family History  Problem Relation Age of Onset   Heart disease Mother    Diabetes Father    Breast cancer Sister 29   Breast cancer Maternal Aunt    Colon cancer Neg Hx    Liver disease Neg Hx     CURRENT MEDICATIONS:  Outpatient Encounter Medications as of 06/01/2018  Medication Sig Note   amoxicillin-clavulanate (AUGMENTIN) 875-125 MG tablet Take 1 tablet by mouth 2 (two) times daily.    atorvastatin (LIPITOR) 20 MG tablet Take 20 mg by mouth every morning.     cyclophosphamide in sodium chloride 0.9 % 250 mL Inject 1,260 mg into the vein every 21 ( twenty-one) days.    DOXOrubicin HCl (ADRIAMYCIN IV) Inject 84 mg  into the vein every 21 ( twenty-one) days.    ETOPOSIDE IV Inject 170 mg into the vein every 21 ( twenty-one) days. Days 1-3    feeding supplement, GLUCERNA SHAKE, (GLUCERNA SHAKE) LIQD Take 237 mLs by mouth 3 (three) times daily between meals.    furosemide (LASIX) 20 MG tablet Take 1 tablet (20 mg total) by mouth daily as needed.    levothyroxine (SYNTHROID, LEVOTHROID) 75 MCG tablet Take 75 mcg by mouth daily before breakfast.     lidocaine-prilocaine (EMLA) cream Apply 1 application topically as needed.    loperamide (IMODIUM) 2 MG capsule Take 2 mg by mouth as needed for diarrhea or loose stools.    magic mouthwash SOLN Take 5 mLs by mouth 3 (three) times daily as needed for mouth pain. With lidocaine 1% 1:1 solution    magnesium oxide (MAG-OX) 400 (241.3 Mg) MG tablet TAKE 1 TABLET BY MOUTH TWICE DAILY    metFORMIN (GLUCOPHAGE) 1000 MG tablet Take 1,000 mg by mouth 2 (two) times daily with a meal.     pegfilgrastim (NEULASTA) 6 MG/0.6ML injection Inject 6 mg into the skin See admin instructions. To be administered 3 days after chemo therapy treatment    potassium chloride (K-DUR) 10 MEQ tablet Take 40 meq today and then 10 meq daily.     potassium chloride (K-DUR) 10 MEQ tablet     predniSONE (DELTASONE) 20 MG tablet TAKE 5 TABLETS BY MOUTH ONCE DAILY ON DAYS 1-5 OF CHEMOTHERAPY.    prochlorperazine (COMPAZINE) 10 MG tablet Take 1 tablet (10 mg total) by mouth every 6 (six) hours as needed for nausea or vomiting.    valACYclovir (VALTREX) 500 MG tablet TAKE 1 TABLET BY MOUTH ONCE DAILY.    vinCRIStine 2 mg in sodium chloride 0.9 % 50 mL Inject 2 mg into the vein every 21 ( twenty-one) days.    vitamin B-12 (CYANOCOBALAMIN) 1000 MCG tablet Take 1 tablet (1,000 mcg total) by mouth daily.    vitamin C (ASCORBIC ACID) 500 MG tablet Take 500 mg by mouth 2 (two) times daily.    zolpidem (AMBIEN) 10 MG tablet Take 1 tablet (10 mg total) by mouth at bedtime as needed for sleep.    calcium-vitamin D (CALCIUM 500+D HIGH POTENCY) 500-400 MG-UNIT tablet Take 1 tablet by mouth 2 (two) times daily. 03/23/2018: On hold   Glucosamine-Chondroit-Vit C-Mn (GLUCOSAMINE 1500 COMPLEX PO) Take 1 capsule by mouth 2 (two) times daily. 03/23/2018: On hold   Facility-Administered Encounter Medications as of 06/01/2018  Medication   Chlorhexidine Gluconate Cloth 2 % PADS 6 each   And   Chlorhexidine Gluconate Cloth 2 % PADS 6 each   diphenhydrAMINE (BENADRYL) injection 25 mg   heparin lock flush 100 unit/mL   sodium chloride flush (NS) 0.9 % injection 10 mL   sodium chloride flush (NS) 0.9 % injection 10 mL    ALLERGIES:  Allergies  Allergen Reactions   Doxycycline Anaphylaxis   Penicillins Anaphylaxis    Did it involve swelling of the face/tongue/throat, SOB, or low BP? Yes-SOB Did it involve sudden or severe rash/hives, skin peeling, or any reaction on the inside of your mouth or nose? Unknown Did you need to seek medical attention at a hospital or doctor's office? Yes When did it last happen?Over 10 years ago If all above answers are NO, may proceed with cephalosporin use.    Levofloxacin Nausea And Vomiting   Sulfa  Antibiotics Itching and Rash     PHYSICAL EXAM:  ECOG Performance status: 1 Temperature is 97.7.  O2 sats 100%.  Blood pressure is 128/70.  Pulse rate is 104. Physical Exam Vitals Good reviewed.  Constitutional:      Appearance: Normal appearance.  Cardiovascular:     Rate and Rhythm: Normal rate and regular rhythm.     Heart sounds: Normal heart sounds.  Pulmonary:     Effort: Pulmonary effort is normal.     Breath sounds: Normal breath sounds.  Abdominal:     General: There is no distension.     Palpations: Abdomen is soft. There is no mass.  Musculoskeletal:        General: No swelling.  Skin:    General: Skin is warm.  Neurological:     General: No focal deficit present.     Mental Status: She is alert and oriented to person, place, and time.  Psychiatric:        Mood and Affect: Mood normal.        Behavior: Behavior normal.      LABORATORY DATA:  I have reviewed the labs as listed.  CBC    Component Value Date/Time   WBC 0.9 (LL) 06/01/2018 0959   RBC 2.47 (L) 06/01/2018 0959   HGB 8.0 (L) 06/01/2018 0959   HCT 23.9 (L) 06/01/2018 0959   HCT 21.1 (L) 01/19/2018 0747   PLT 16 (LL) 06/01/2018 0959   MCV 96.8 06/01/2018 0959   MCH 32.4 06/01/2018 0959   MCHC 33.5 06/01/2018 0959   RDW 20.5 (H) 06/01/2018 0959   LYMPHSABS 0.2 (L) 06/01/2018 0959   MONOABS 0.1 06/01/2018 0959   EOSABS 0.0 06/01/2018 0959   BASOSABS 0.0 06/01/2018 0959   CMP Latest Ref Rng & Units 06/01/2018 05/30/2018 05/29/2018  Glucose 70 - 99 mg/dL 233(H) 170(H) 354(H)  BUN 6 - 20 mg/dL 18 17 30(H)  Creatinine 0.44 - 1.00 mg/dL 0.67 0.61 0.84  Sodium 135 - 145 mmol/L 145 139 136  Potassium 3.5 - 5.1 mmol/L 3.0(L) 2.2(LL) 3.5  Chloride 98 - 111 mmol/L 110 111 107  CO2 22 - 32 mmol/L 24 20(L) 21(L)  Calcium 8.9 - 10.3 mg/dL 8.8(L) 8.0(L) 8.3(L)  Total Protein 6.5 - 8.1 g/dL 5.5(L) - -  Total Bilirubin 0.3 - 1.2 mg/dL 0.5 - -  Alkaline Phos 38 - 126 U/L 100 - -  AST 15 - 41 U/L 15 - -    ALT 0 - 44 U/L 22 - -       DIAGNOSTIC IMAGING:  I have independently reviewed the scans and discussed with the patient.   I have reviewed Venita Lick LPN's note and agree with the documentation.  I personally performed a face-to-face visit, made revisions and my assessment and plan is as follows.    ASSESSMENT & PLAN:   Lymphoma, peripheral T-cell, spleen ([facility]) 1.  PTCL, NOS: -Presentation with pancytopenia, night sweats and fevers for last 1 to 2 months. - CT CAP shows splenomegaly with no lymphadenopathy. 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  T cells do not express TDT, CD1a, CD10, CD15, CD30, CD34, CD56, CD 117 or CD99.  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 - Prognostic index for PTCL (PIT) has 2 positive factors including serum LDH above normal and bone marrow involvement. - She is not a candidate for brentuximab-based regimen as CD30 was negative. -We talked about first-line therapy with CHOP-based  regimen.  I have also talked to her about CHOEP regimen which has shown to be helpful for patients less than 60 and normal LDH. - We will obtain a baseline PET CT scan. - She has a PICC line placed for chemotherapy administration. -2D echo on 01/24/2018 shows ejection fraction of 65 to 70%. -PET/CT scan on 02/02/2018 shows splenomegaly with no hypermetabolism.  No enlarged or hypermetabolic adenopathy in the neck chest abdomen or pelvis. - 4 cycles of CHOEP from 01/28/2018 through 04/01/2018. -She met with Dr. Minna Antis at [facility] for consolidation transplant. -PET/CT scan on 04/13/2018 shows no FDG mass or adenopathy.  Decrease in size of the spleen measuring 13 cm, previously 16 cm.  Considerable heterogeneous uptake throughout the axial and appendicular skeleton most notably in the thoracic and lumbar spine.  I think this is from G-CSF. -Bone  marrow biopsy on 04/16/2018 shows atypical T-cell proliferation, persistent.  The extensive nature of T-cell infiltrate is concerning for a neoplastic T-cell lymphoproliferative process.  T-cell receptor PCR is polyclonal.  Additional analysis for expression of BCL 6, CXCL13 and PD1 were sent. -I have also called and talked to Dr. Minna Antis about pathology report.  As there is improvement in her blood counts and decreasing spleen volume, it is highly likely that it is a malignant process.  Hence she was recommended to proceed with her final cycle.   -6 cycles of CHOEP from 01/28/2018 through 05/20/2018. - Final pathology of bone marrow biopsy: pending .  - She developed neutropenic fever on 05/29/2018. Work up was positive for rhinovirus. She was started on Augmentin 875 BID.  - She denies any further fevers or Good and symptoms of infection. Lab work has improved WBC: 0.9, Hgb: 8.0, Plt: 16; ANC: 600 - She will follow up with Dr. Minna Antis on May 19th at [facility] for consideration of allogeneic transplant. She will RTC in 4 weeks. We will plan to repeat scans at that time, if not ordered at [facility].   2.  Hypomagnesemia: -Magnesium is 1.6 -She will continue magnesium 400 BID  3. Hypokalemia: - Potassium 3.0 - Pt was started on Klor-Con 20 mEq daily in the ER. Continue to monitor.       Total time spent is 25 minutes with more than 50% of the time spent face-to-face discussing and reinforcing treatment plan and coordination of care.    Orders placed this encounter:  Orders Placed This Encounter  Procedures   NM PET Image Restag (PS) Skull Base To Thigh      Derek Jack, MD [place] [phone]

## 2018-06-01 NOTE — Patient Instructions (Addendum)
[facility] at [facility] Discharge Instructions  You were seen today by Dr. Katragadda. He went over your recent lab results. He will see you back in 10 days  for labs and follow up.   Thank you for choosing [facility] at [facility] to provide your oncology and hematology care.  To afford each patient quality time with our provider, please arrive at least 15 minutes before your scheduled appointment time.   If you have a lab appointment with the Cancer Center please come in thru the  Main Entrance and check in at the main information desk  You need to re-schedule your appointment should you arrive 10 or more minutes late.  We strive to give you quality time with our providers, and arriving late affects you and other patients whose appointments are after yours.  Also, if you no show three or more times for appointments you may be dismissed from the clinic at the providers discretion.     Again, thank you for choosing [facility].  Our hope is that these requests will decrease the amount of time that you wait before being seen by our physicians.       _____________________________________________________________  Should you have questions after your visit to [facility], please contact our office at [phone] between the hours of 8:00 a.m. and 4:30 p.m.  Voicemails left after 4:00 p.m. will not be returned until the following business day.  For prescription refill requests, have your pharmacy contact our office and allow 72 hours.    Cancer Center Support Programs:   > Cancer Support Group  2nd Tuesday of the month 1pm-2pm, Journey Room    

## 2018-06-01 NOTE — ED Provider Notes (Signed)
[facility] Provider Note   CSN: [id number] Arrival date & time: 05/28/18  1716    History   Chief Complaint Chief Complaint  Patient presents with  . Fever    HPI Kristin Good is a 58 y.o. female.     HPI   62yF with neutropenic fever. 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 She has had fever, cough and fatigue for 203 days. Noted to be severely neutropenic at appointment today. Given IV vanc and also platelets for thrombocytopenia. Oncology tried to direct admit but pt needing COVID testing prior to admission and referred to the ED.   Past Medical History:  Diagnosis Date  . Diabetes ([place]) 01/20/2018  . HTN (hypertension)   . Hypothyroidism   . 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 Diabetes mellitus due to underlying condition, uncontrolled, with hyperglycemia, without long-term current use of insulin ([place]) 05/29/2018  . Hypomagnesemia 05/29/2018  . Neutropenic fever ([place]) 05/28/2018  . Febrile neutropenia ([place]) 02/08/2018  . 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 Protein-calorie malnutrition, severe 01/20/2018  . Transaminasemia   . Pancytopenia ([place]) 01/18/2018  . Essential hypertension 01/18/2018  . AKI (acute kidney injury) ([place])   . Dehydration   . Transaminitis   . IBS (irritable bowel syndrome) 07/03/2010  . Colon cancer screening 07/03/2010    Past Surgical History:  Procedure Laterality Date  . COLONOSCOPY  07/27/2010   Procedure: COLONOSCOPY;  Surgeon: Dorothyann Peng, MD;  Location: [facility] ENDO [facility]ITE;  Service: Endoscopy;  Laterality: N/A;  . infertility surgery    . MOHS SURGERY     basal cell carcinoma  . PORTACATH PLACEMENT Left 04/01/2018   Procedure: INSERTION PORT-A-CATH;  Surgeon: Aviva Signs, MD;  Location: [facility] ORS;  Service: General;  Laterality: Left;  . REDUCTION MAMMAPLASTY Bilateral 2002     OB History   No  obstetric history on file.      Home Medications    Prior to Admission medications   Medication Sig Start Date End Date Taking? Authorizing Provider  atorvastatin (LIPITOR) 20 MG tablet Take 20 mg by mouth every morning.  04/17/17  Yes [provider]  cyclophosphamide in sodium chloride 0.9 % 250 mL Inject 1,260 mg into the vein every 21 ( twenty-one) days.   Yes [provider]  DOXOrubicin HCl (ADRIAMYCIN IV) Inject 84 mg into the vein every 21 ( twenty-one) days.   Yes [provider]  ETOPOSIDE IV Inject 170 mg into the vein every 21 ( twenty-one) days. Days 1-3   Yes [provider]  furosemide (LASIX) 20 MG tablet Take 1 tablet (20 mg total) by mouth daily as needed. 01/28/18  Yes Derek Jack, MD  Glucosamine-Chondroit-Vit C-Mn (GLUCOSAMINE 1500 COMPLEX PO) Take 1 capsule by mouth 2 (two) times daily.   Yes [provider]  levothyroxine (SYNTHROID, LEVOTHROID) 75 MCG tablet Take 75 mcg by mouth daily before breakfast.  10/17/17  Yes [provider]  lidocaine-prilocaine (EMLA) cream Apply 1 application topically as needed. 04/01/18  Yes Derek Jack, MD  loperamide (IMODIUM) 2 MG capsule Take 2 mg by mouth as needed for diarrhea or loose stools.   Yes [provider]  magic mouthwash SOLN Take 5 mLs by mouth 3 (three) times daily as needed for mouth pain. With lidocaine 1% 1:1 solution 02/16/18  Yes Francene Finders  L, NP-C  magnesium oxide (MAG-OX) 400 (241.3 Mg) MG tablet TAKE 1 TABLET BY MOUTH TWICE DAILY 05/21/18  Yes Lockamy, Randi L, NP-C  metFORMIN (GLUCOPHAGE) 1000 MG tablet Take 1,000 mg by mouth 2 (two) times daily with a meal.  10/17/17  Yes [provider]  pegfilgrastim (NEULASTA) 6 MG/0.6ML injection Inject 6 mg into the skin See admin instructions. To be administered 3 days after chemo therapy treatment   Yes [provider]  predniSONE (DELTASONE) 20 MG tablet TAKE 5 TABLETS BY MOUTH  ONCE DAILY ON DAYS 1-5 OF CHEMOTHERAPY. 05/01/18  Yes Derek Jack, MD  prochlorperazine (COMPAZINE) 10 MG tablet Take 1 tablet (10 mg total) by mouth every 6 (six) hours as needed for nausea or vomiting. 01/28/18  Yes Derek Jack, MD  valACYclovir (VALTREX) 500 MG tablet TAKE 1 TABLET BY MOUTH ONCE DAILY. 04/30/18  Yes Derek Jack, MD  vinCRIStine 2 mg in sodium chloride 0.9 % 50 mL Inject 2 mg into the vein every 21 ( twenty-one) days.   Yes [provider]  vitamin B-12 (CYANOCOBALAMIN) 1000 MCG tablet Take 1 tablet (1,000 mcg total) by mouth daily. 01/24/18  Yes Gherghe, Vella Redhead, MD  vitamin C (ASCORBIC ACID) 500 MG tablet Take 500 mg by mouth 2 (two) times daily.   Yes [provider]  zolpidem (AMBIEN) 10 MG tablet Take 1 tablet (10 mg total) by mouth at bedtime as needed for sleep. 05/18/18  Yes Derek Jack, MD  amoxicillin-clavulanate (AUGMENTIN) 875-125 MG tablet Take 1 tablet by mouth 2 (two) times daily. 05/30/18   Debbe Odea, MD  calcium-vitamin D (CALCIUM 500+D HIGH POTENCY) 500-400 MG-UNIT tablet Take 1 tablet by mouth 2 (two) times daily.    [provider]  feeding supplement, GLUCERNA SHAKE, (GLUCERNA SHAKE) LIQD Take 237 mLs by mouth 3 (three) times daily between meals. 05/30/18   Debbe Odea, MD  potassium chloride (K-DUR) 10 MEQ tablet Take 40 meq today and then 10 meq daily. 05/30/18   Debbe Odea, MD  potassium chloride (K-DUR) 10 MEQ tablet  05/30/18   [provider]    Family History Family History  Problem Relation Age of Onset  . Heart disease Mother   . Diabetes Father   . Breast cancer Sister 52  . Breast cancer Maternal Aunt   . Colon cancer Neg Hx   . Liver disease Neg Hx     Social History Social History   Tobacco Use  . Smoking status: Never Smoker  . Smokeless tobacco: Never Used  Substance Use Topics  . Alcohol use: Yes    Comment: once or twice a year  . Drug use: No      Allergies   Doxycycline; Penicillins; Levofloxacin; and Sulfa antibiotics   Review of Systems Review of Systems  All systems reviewed and negative, other than as noted in HPI.  Physical Exam Updated Vital Signs BP 139/85   Pulse 96   Temp 98.7 F (37.1 C) (Oral)   Resp 16   Ht 5\' 3"  (1.6 m)   Wt 52.5 kg   SpO2 100%   BMI 20.50 kg/m   Physical Exam Vitals signs and nursing note reviewed.  Constitutional:      General: She is not in acute distress.    Appearance: She is well-developed. She is ill-appearing.     Comments: Chronically ill appearing but not distressed. Cachectic.   HENT:     Head: Normocephalic and atraumatic.  Eyes:  General:        Right eye: No discharge.        Left eye: No discharge.     Conjunctiva/sclera: Conjunctivae normal.  Neck:     Musculoskeletal: Neck supple.  Cardiovascular:     Rate and Rhythm: Normal rate and regular rhythm.     Heart sounds: Normal heart sounds. No murmur. No friction rub. No gallop.   Pulmonary:     Effort: Pulmonary effort is normal. No respiratory distress.     Breath sounds: Normal breath sounds.  Abdominal:     General: There is no distension.     Palpations: Abdomen is soft.     Tenderness: There is no abdominal tenderness.  Musculoskeletal:        General: No tenderness.  Skin:    General: Skin is warm and dry.  Neurological:     Mental Status: She is alert.  Psychiatric:        Behavior: Behavior normal.        Thought Content: Thought content normal.      ED Treatments / Results  Labs (all labs ordered are listed, but only abnormal results are displayed) Labs Reviewed  RESPIRATORY PANEL BY PCR - Abnormal; Notable for the following components:      Result Value   Rhinovirus / Enterovirus DETECTED (*)    All other components within normal limits  URINALYSIS, ROUTINE W REFLEX MICROSCOPIC - Abnormal; Notable for the following components:   Glucose, UA >=500 (*)    Protein, ur 30 (*)    All  other components within normal limits  LACTIC ACID, PLASMA - Abnormal; Notable for the following components:   Lactic Acid, Venous 2.0 (*)    All other components within normal limits  CBC - Abnormal; Notable for the following components:   WBC 0.2 (*)    RBC 1.96 (*)    Hemoglobin 6.9 (*)    HCT 20.1 (*)    MCV 102.6 (*)    MCH 35.2 (*)    RDW 18.2 (*)    Platelets 43 (*)    All other components within normal limits  BASIC METABOLIC PANEL - Abnormal; Notable for the following components:   CO2 21 (*)    Glucose, Bld 354 (*)    BUN 30 (*)    Calcium 8.3 (*)    All other components within normal limits  GLUCOSE, CAPILLARY - Abnormal; Notable for the following components:   Glucose-Capillary 308 (*)    All other components within normal limits  DIFFERENTIAL - Abnormal; Notable for the following components:   Neutro Abs 0.0 (*)    Lymphs Abs 0.1 (*)    Monocytes Absolute 0.0 (*)    All other components within normal limits  HEMOGLOBIN A1C - Abnormal; Notable for the following components:   Hgb A1c MFr Bld 8.5 (*)    All other components within normal limits  GLUCOSE, CAPILLARY - Abnormal; Notable for the following components:   Glucose-Capillary 318 (*)    All other components within normal limits  GLUCOSE, CAPILLARY - Abnormal; Notable for the following components:   Glucose-Capillary 141 (*)    All other components within normal limits  CBC WITH DIFFERENTIAL/PLATELET - Abnormal; Notable for the following components:   WBC 0.3 (*)    RBC 2.26 (*)    Hemoglobin 7.4 (*)    HCT 21.2 (*)    RDW 20.3 (*)    Platelets 22 (*)    Neutro Abs 0.1 (*)  Lymphs Abs 0.1 (*)    Monocytes Absolute 0.0 (*)    All other components within normal limits  BASIC METABOLIC PANEL - Abnormal; Notable for the following components:   Potassium 2.2 (*)    CO2 20 (*)    Glucose, Bld 170 (*)    Calcium 8.0 (*)    All other components within normal limits  GLUCOSE, CAPILLARY - Abnormal;  Notable for the following components:   Glucose-Capillary 170 (*)    All other components within normal limits  GLUCOSE, CAPILLARY - Abnormal; Notable for the following components:   Glucose-Capillary 172 (*)    All other components within normal limits  SARS CORONAVIRUS 2 (HOSPITAL ORDER, [facility])  URINE CULTURE  MRSA PCR SCREENING  C DIFFICILE QUICK SCREEN W PCR REFLEX  LACTIC ACID, PLASMA  PROCALCITONIN  PROCALCITONIN  MAGNESIUM  PROCALCITONIN  CBC WITH DIFFERENTIAL/PLATELET  PREPARE PLATELET PHERESIS  TYPE AND SCREEN  PREPARE RBC (CROSSMATCH)    EKG None  Radiology No results found.  Procedures Procedures (including critical care time)  Medications Ordered in ED Medications  0.9 %  sodium chloride infusion (Manually program via Guardrails IV Fluids) ( Intravenous New Bag/Given 05/28/18 2103)  acetaminophen (TYLENOL) tablet 1,000 mg (1,000 mg Oral Given 05/28/18 2330)  0.9 %  sodium chloride infusion (Manually program via Guardrails IV Fluids) ( Intravenous New Bag/Given 05/29/18 1135)  magnesium sulfate IVPB 2 g 50 mL (2 g Intravenous New Bag/Given 05/29/18 1136)  potassium chloride SA (K-DUR) CR tablet 40 mEq (40 mEq Oral Given 05/29/18 0955)  acetaminophen (TYLENOL) tablet 650 mg (650 mg Oral Given 05/29/18 1420)  diphenhydrAMINE (BENADRYL) capsule 25 mg (25 mg Oral Given 05/29/18 1419)     Initial Impression / Assessment and Plan / ED Course  I have reviewed the triage vital signs and the nursing notes.  Pertinent labs & imaging results that were available during my care of the patient were reviewed by me and considered in my medical decision making (see chart for details).        57yF with neutropenic fever. Empiric abx. Admit. Transfused platelets for thrombocytopenia.   Final Clinical Impressions(s) / ED Diagnoses   Final diagnoses:  Neutropenic fever ([place])  Thrombocytopenia [facility])    ED Discharge Orders         Ordered     feeding supplement, GLUCERNA SHAKE, (GLUCERNA SHAKE) LIQD  3 times daily between meals     05/30/18 1003    amoxicillin-clavulanate (AUGMENTIN) 875-125 MG tablet  2 times daily     05/30/18 1003    Increase activity slowly     05/30/18 1003    Diet - low sodium heart healthy     05/30/18 1003    potassium chloride (K-DUR) 10 MEQ tablet     05/30/18 1108           Virgel Manifold, MD 06/01/18 1312

## 2018-06-02 LAB — CULTURE, BLOOD (ROUTINE X 2)
Culture: NO GROWTH
Culture: NO GROWTH
Special Requests: ADEQUATE
Special Requests: ADEQUATE

## 2018-06-03 ENCOUNTER — Other Ambulatory Visit (HOSPITAL_COMMUNITY): Payer: Commercial Managed Care - PPO

## 2018-06-05 ENCOUNTER — Other Ambulatory Visit (HOSPITAL_COMMUNITY): Payer: Commercial Managed Care - PPO

## 2018-06-09 ENCOUNTER — Other Ambulatory Visit (HOSPITAL_COMMUNITY): Payer: Self-pay | Admitting: Hematology

## 2018-06-10 ENCOUNTER — Inpatient Hospital Stay (HOSPITAL_COMMUNITY): Payer: Commercial Managed Care - PPO

## 2018-06-10 ENCOUNTER — Other Ambulatory Visit: Payer: Self-pay

## 2018-06-10 DIAGNOSIS — C8447 Peripheral T-cell lymphoma, not classified, spleen: Secondary | ICD-10-CM

## 2018-06-10 LAB — CBC WITH DIFFERENTIAL/PLATELET
Abs Immature Granulocytes: 0.15 10*3/uL — ABNORMAL HIGH (ref 0.00–0.07)
Basophils Absolute: 0 10*3/uL (ref 0.0–0.1)
Basophils Relative: 0 %
Eosinophils Absolute: 0 10*3/uL (ref 0.0–0.5)
Eosinophils Relative: 0 %
HCT: 27.1 % — ABNORMAL LOW (ref 36.0–46.0)
Hemoglobin: 8.8 g/dL — ABNORMAL LOW (ref 12.0–15.0)
Immature Granulocytes: 5 %
Lymphocytes Relative: 11 %
Lymphs Abs: 0.3 10*3/uL — ABNORMAL LOW (ref 0.7–4.0)
MCH: 33.2 pg (ref 26.0–34.0)
MCHC: 32.5 g/dL (ref 30.0–36.0)
MCV: 102.3 fL — ABNORMAL HIGH (ref 80.0–100.0)
Monocytes Absolute: 0.4 10*3/uL (ref 0.1–1.0)
Monocytes Relative: 11 %
Neutro Abs: 2.3 10*3/uL (ref 1.7–7.7)
Neutrophils Relative %: 73 %
Platelets: 44 10*3/uL — ABNORMAL LOW (ref 150–400)
RBC: 2.65 MIL/uL — ABNORMAL LOW (ref 3.87–5.11)
RDW: 21.4 % — ABNORMAL HIGH (ref 11.5–15.5)
WBC: 3.2 10*3/uL — ABNORMAL LOW (ref 4.0–10.5)
nRBC: 0.6 % — ABNORMAL HIGH (ref 0.0–0.2)

## 2018-06-10 LAB — COMPREHENSIVE METABOLIC PANEL
ALT: 17 U/L (ref 0–44)
AST: 19 U/L (ref 15–41)
Albumin: 3.6 g/dL (ref 3.5–5.0)
Alkaline Phosphatase: 90 U/L (ref 38–126)
Anion gap: 14 (ref 5–15)
BUN: 24 mg/dL — ABNORMAL HIGH (ref 6–20)
CO2: 26 mmol/L (ref 22–32)
Calcium: 9.4 mg/dL (ref 8.9–10.3)
Chloride: 102 mmol/L (ref 98–111)
Creatinine, Ser: 0.7 mg/dL (ref 0.44–1.00)
GFR calc Af Amer: >60 mL/min (ref >60)
GFR calc non Af Amer: >60 mL/min (ref >60)
Glucose, Bld: 197 mg/dL — ABNORMAL HIGH (ref 70–99)
Potassium: 4.6 mmol/L (ref 3.5–5.1)
Sodium: 142 mmol/L (ref 135–145)
Total Bilirubin: 0.7 mg/dL (ref 0.3–1.2)
Total Protein: 5.9 g/dL — ABNORMAL LOW (ref 6.5–8.1)

## 2018-06-10 LAB — MAGNESIUM: Magnesium: 1.6 mg/dL — ABNORMAL LOW (ref 1.7–2.4)

## 2018-06-10 MED ORDER — LEVOFLOXACIN 500 MG PO TABS: 500.0000 mg | ORAL_TABLET | Freq: Every day | ORAL | 0 refills | Status: DC

## 2018-06-15 ENCOUNTER — Other Ambulatory Visit: Payer: Self-pay

## 2018-06-15 ENCOUNTER — Inpatient Hospital Stay (HOSPITAL_COMMUNITY): Payer: Commercial Managed Care - PPO | Attending: Hematology

## 2018-06-15 ENCOUNTER — Ambulatory Visit (HOSPITAL_COMMUNITY)
Admission: RE | Admit: 2018-06-15 | Discharge: 2018-06-15 | Disposition: A | Discharge status: Home / Self Care | Payer: Commercial Managed Care - PPO | Source: Ambulatory Visit | Attending: Hematology | Admitting: Hematology

## 2018-06-15 DIAGNOSIS — Z7984 Long term (current) use of oral hypoglycemic drugs: Secondary | ICD-10-CM | POA: Insufficient documentation

## 2018-06-15 DIAGNOSIS — C8447 Peripheral T-cell lymphoma, not classified, spleen: Secondary | ICD-10-CM | POA: Insufficient documentation

## 2018-06-15 DIAGNOSIS — E039 Hypothyroidism, unspecified: Secondary | ICD-10-CM | POA: Insufficient documentation

## 2018-06-15 DIAGNOSIS — Z803 Family history of malignant neoplasm of breast: Secondary | ICD-10-CM | POA: Insufficient documentation

## 2018-06-15 DIAGNOSIS — E119 Type 2 diabetes mellitus without complications: Secondary | ICD-10-CM | POA: Diagnosis not present

## 2018-06-15 DIAGNOSIS — I10 Essential (primary) hypertension: Secondary | ICD-10-CM | POA: Diagnosis not present

## 2018-06-15 DIAGNOSIS — E876 Hypokalemia: Secondary | ICD-10-CM | POA: Diagnosis not present

## 2018-06-15 DIAGNOSIS — Z79899 Other long term (current) drug therapy: Secondary | ICD-10-CM | POA: Diagnosis not present

## 2018-06-15 LAB — GLUCOSE, CAPILLARY: Glucose-Capillary: 174 mg/dL — ABNORMAL HIGH (ref 70–99)

## 2018-06-15 MED ORDER — STERILE WATER FOR INJECTION IJ SOLN
INTRAMUSCULAR | Status: AC
  Filled 2018-06-15: qty 10

## 2018-06-15 MED ORDER — FLUDEOXYGLUCOSE F - 18 (FDG) INJECTION
5.7500 | Freq: Once | INTRAVENOUS | Status: AC | PRN
  Administered 2018-06-15: 5.75 via INTRAVENOUS

## 2018-06-15 MED ORDER — SODIUM CHLORIDE 0.9% FLUSH
20.0000 mL | Freq: Once | INTRAVENOUS | Status: AC
  Administered 2018-06-15: 20 mL via INTRAVENOUS

## 2018-06-15 MED ORDER — ALTEPLASE 2 MG IJ SOLR
2.0000 mg | Freq: Once | INTRAMUSCULAR | Status: DC
  Filled 2018-06-15: qty 2

## 2018-06-15 MED ORDER — HEPARIN SOD (PORK) LOCK FLUSH 100 UNIT/ML IV SOLN
500.0000 [IU] | Freq: Once | INTRAVENOUS | Status: AC
  Administered 2018-06-15: 12:00:00 500 [IU] via INTRAVENOUS

## 2018-06-15 NOTE — Progress Notes (Signed)
Patient presents today from [place], where they accessed her port but were unable to flush it. Per the patient, they could get blood but it would not flush. When I attempted to flush the port, I might slight resistance. I repositioned the needle slightly as it was tilted in the port. I attempted again to flush the port and it flushed without resistance. No swelling or pain. Positive blood return noted. Port flushed with 20cc NS and 500units Heparin. Deaccessed and bandaid applied. Pt discharged ambulatory in satisfactory condition.

## 2018-06-17 ENCOUNTER — Inpatient Hospital Stay (HOSPITAL_BASED_OUTPATIENT_CLINIC_OR_DEPARTMENT_OTHER): Payer: Commercial Managed Care - PPO | Admitting: Hematology

## 2018-06-17 ENCOUNTER — Other Ambulatory Visit: Payer: Self-pay

## 2018-06-17 ENCOUNTER — Inpatient Hospital Stay (HOSPITAL_COMMUNITY): Payer: Commercial Managed Care - PPO

## 2018-06-17 ENCOUNTER — Encounter (HOSPITAL_COMMUNITY): Payer: Self-pay | Admitting: Hematology

## 2018-06-17 DIAGNOSIS — E119 Type 2 diabetes mellitus without complications: Secondary | ICD-10-CM

## 2018-06-17 DIAGNOSIS — I10 Essential (primary) hypertension: Secondary | ICD-10-CM | POA: Diagnosis not present

## 2018-06-17 DIAGNOSIS — C8447 Peripheral T-cell lymphoma, not classified, spleen: Secondary | ICD-10-CM | POA: Diagnosis not present

## 2018-06-17 DIAGNOSIS — E876 Hypokalemia: Secondary | ICD-10-CM

## 2018-06-17 DIAGNOSIS — E039 Hypothyroidism, unspecified: Secondary | ICD-10-CM

## 2018-06-17 DIAGNOSIS — Z803 Family history of malignant neoplasm of breast: Secondary | ICD-10-CM

## 2018-06-17 DIAGNOSIS — Z7984 Long term (current) use of oral hypoglycemic drugs: Secondary | ICD-10-CM

## 2018-06-17 DIAGNOSIS — Z79899 Other long term (current) drug therapy: Secondary | ICD-10-CM

## 2018-06-17 LAB — CBC WITH DIFFERENTIAL/PLATELET
Abs Immature Granulocytes: 0.06 10*3/uL (ref 0.00–0.07)
Basophils Absolute: 0 10*3/uL (ref 0.0–0.1)
Basophils Relative: 0 %
Eosinophils Absolute: 0 10*3/uL (ref 0.0–0.5)
Eosinophils Relative: 0 %
HCT: 27.4 % — ABNORMAL LOW (ref 36.0–46.0)
Hemoglobin: 8.7 g/dL — ABNORMAL LOW (ref 12.0–15.0)
Immature Granulocytes: 2 %
Lymphocytes Relative: 20 %
Lymphs Abs: 0.5 10*3/uL — ABNORMAL LOW (ref 0.7–4.0)
MCH: 32.7 pg (ref 26.0–34.0)
MCHC: 31.8 g/dL (ref 30.0–36.0)
MCV: 103 fL — ABNORMAL HIGH (ref 80.0–100.0)
Monocytes Absolute: 0.4 10*3/uL (ref 0.1–1.0)
Monocytes Relative: 15 %
Neutro Abs: 1.6 10*3/uL — ABNORMAL LOW (ref 1.7–7.7)
Neutrophils Relative %: 63 %
Platelets: 55 10*3/uL — ABNORMAL LOW (ref 150–400)
RBC: 2.66 MIL/uL — ABNORMAL LOW (ref 3.87–5.11)
RDW: 20.7 % — ABNORMAL HIGH (ref 11.5–15.5)
WBC: 2.6 10*3/uL — ABNORMAL LOW (ref 4.0–10.5)
nRBC: 0 % (ref 0.0–0.2)

## 2018-06-17 LAB — COMPREHENSIVE METABOLIC PANEL
ALT: 11 U/L (ref 0–44)
AST: 10 U/L — ABNORMAL LOW (ref 15–41)
Albumin: 3.7 g/dL (ref 3.5–5.0)
Alkaline Phosphatase: 88 U/L (ref 38–126)
Anion gap: 13 (ref 5–15)
BUN: 17 mg/dL (ref 6–20)
CO2: 23 mmol/L (ref 22–32)
Calcium: 8.8 mg/dL — ABNORMAL LOW (ref 8.9–10.3)
Chloride: 103 mmol/L (ref 98–111)
Creatinine, Ser: 0.74 mg/dL (ref 0.44–1.00)
GFR calc Af Amer: >60 mL/min (ref >60)
GFR calc non Af Amer: >60 mL/min (ref >60)
Glucose, Bld: 281 mg/dL — ABNORMAL HIGH (ref 70–99)
Potassium: 3.8 mmol/L (ref 3.5–5.1)
Sodium: 139 mmol/L (ref 135–145)
Total Bilirubin: 0.6 mg/dL (ref 0.3–1.2)
Total Protein: 5.8 g/dL — ABNORMAL LOW (ref 6.5–8.1)

## 2018-06-17 NOTE — Assessment & Plan Note (Addendum)
1.  PTCL, NOS: -Presentation with pancytopenia, night sweats and fevers for last 1 to 2 months. - CT CAP shows splenomegaly with no lymphadenopathy. 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  T cells do not express TDT, CD1a, CD10, CD15, CD30, CD34, CD56, CD 117 or CD99.  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 - Prognostic index for PTCL (PIT) has 2 positive factors including serum LDH above normal and bone marrow involvement. - She is not a candidate for brentuximab-based regimen as CD30 was negative. -We talked about first-line therapy with CHOP-based regimen.  I have also talked to her about CHOEP regimen which has shown to be helpful for patients less than 60 and normal LDH. - We will obtain a baseline PET CT scan. - She has a PICC line placed for chemotherapy administration. -2D echo on 01/24/2018 shows ejection fraction of 65 to 70%. -PET/CT scan on 02/02/2018 shows splenomegaly with no hypermetabolism.  No enlarged or hypermetabolic adenopathy in the neck chest abdomen or pelvis. -PET/CT scan on 04/13/2018 shows no FDG mass or adenopathy.  Decrease in size of the spleen measuring 13 cm, previously 16 cm.  Considerable heterogeneous uptake throughout the axial and appendicular skeleton most notably in the thoracic and lumbar spine.  I think this is from G-CSF. -Bone marrow biopsy on 04/16/2018 shows atypical T-cell proliferation, persistent.  The extensive nature of T-cell infiltrate is concerning for a neoplastic T-cell lymphoproliferative process.  T-cell receptor PCR is polyclonal.  Additional analysis for expression of BCL 6, CXCL13 and PD1 were sent. -6 cycles of CHOEP from 01/28/2018 through 05/20/2018. -She was recently hospitalized on 05/29/2018.  She required antibiotics including Levaquin which she completed yesterday. -Her cough is better.  She is walking at  least 1 mile per day.  She is also gained 45 pounds. - She has an appointment to see Dr. Minna Antis at [facility] on 07/07/2018.  -We reviewed results of the PET CT scan dated 06/15/2018 which did not show any evidence of recurrent lymphoma.  Splenomegaly measuring about 17 cm.  Bilateral renal stones present.  We have also reviewed her blood counts.  They are improving slowly. -We will see her back in 5 weeks for follow-up.  2.  Hypomagnesemia: She will continue magnesium 400 mg twice daily.  3.  Hypokalemia: -Potassium is normal today.  She will continue potassium 20 mg daily.

## 2018-06-17 NOTE — Progress Notes (Signed)
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[place])    01/28/2018 -  Chemotherapy    The patient had DOXOrubicin (ADRIAMYCIN) chemo injection 50 mg, 30 mg/m2 = 50 mg (60 % of original dose 50 mg/m2), Intravenous,  Once, 6 of 8 cycles Dose modification: 30 mg/m2 (60 % of original dose 50 mg/m2, Cycle 1, Reason: Other (see comments), Comment: elevated bilirubin) Administration: 50 mg (01/28/2018), 76 mg (03/11/2018), 76 mg (02/18/2018), 76 mg (04/01/2018), 76 mg (04/21/2018), 76 mg (05/20/2018) palonosetron (ALOXI) injection 0.25 mg, 0.25 mg, Intravenous,  Once, 6 of 8 cycles Administration: 0.25 mg (01/28/2018), 0.25 mg (02/18/2018), 0.25 mg (03/11/2018), 0.25 mg (04/01/2018), 0.25 mg (04/21/2018), 0.25 mg (05/20/2018) pegfilgrastim (NEULASTA ONPRO KIT) injection 6 mg, 6 mg, Subcutaneous, Once, 6 of 8 cycles Administration: 6 mg (02/20/2018), 6 mg (04/03/2018), 6 mg (04/23/2018), 6 mg (05/22/2018) vinCRIStine (ONCOVIN) 1 mg in sodium chloride 0.9 % 50 mL chemo infusion, 1 mg (50 % of original dose 2 mg), Intravenous,  Once, 6 of 8 cycles Dose modification: 1 mg (50 % of original dose 2 mg, Cycle 1, Reason: Other (see comments), Comment: liver dysfunctyion) Administration: 1 mg (01/28/2018), 2 mg (03/11/2018), 2 mg (02/18/2018), 2 mg (04/01/2018), 2 mg (04/21/2018), 2 mg (05/20/2018) cyclophosphamide (CYTOXAN) 1,260 mg in sodium chloride 0.9 % 250 mL chemo infusion, 750 mg/m2 = 1,260 mg, Intravenous,  Once, 6 of 8 cycles Administration: 1,260 mg (01/28/2018), 1,140 mg (03/11/2018), 1,140 mg (02/18/2018), 1,140 mg (04/01/2018), 1,140 mg (04/21/2018), 1,140 mg (05/20/2018) etoposide  (VEPESID) 130 mg in sodium chloride 0.9 % 500 mL chemo infusion, 75 mg/m2 = 130 mg (75 % of original dose 100 mg/m2), Intravenous,  Once, 6 of 8 cycles Dose modification: 75 mg/m2 (75 % of original dose 100 mg/m2, Cycle 1, Reason: Change in LFTs), 75 mg/m2 (75 % of original dose 100 mg/m2, Cycle 1, Reason: Change in LFTs) Administration: 130 mg (01/28/2018), 130 mg (01/29/2018), 130 mg (01/30/2018), 150 mg (02/19/2018), 150 mg (02/20/2018), 150 mg (03/12/2018), 150 mg (03/13/2018), 150 mg (04/02/2018), 150 mg (04/03/2018), 150 mg (04/22/2018), 150 mg (04/23/2018), 150 mg (05/21/2018), 150 mg (05/22/2018), 150 mg (03/11/2018), 150 mg (02/18/2018), 150 mg (04/01/2018), 150 mg (04/21/2018), 150 mg (05/20/2018) fosaprepitant (EMEND) 150 mg, dexamethasone (DECADRON) 12 mg in sodium chloride 0.9 % 145 mL IVPB, , Intravenous,  Once, 6 of 6 cycles Administration:  (01/28/2018),  (02/18/2018),  (03/11/2018),  (04/01/2018),  (04/21/2018),  (05/20/2018)  for chemotherapy treatment.       CANCER STAGING: Cancer Staging No matching staging information was found for the patient.   INTERVAL HISTORY:  Kristin Good 58 y.o. female returns for routine follow-up. She is here today alone. She states that she has been walking at home but not every day. She states that she continues to try to gain weight. She continues to have some swelling in her ankles. Denies any nausea, vomiting, or diarrhea. Denies any new pains. Had not noticed any recent bleeding such as epistaxis, hematuria or hematochezia. Denies recent chest pain on exertion, shortness of breath on minimal exertion, pre-syncopal episodes, or palpitations. Denies any numbness or tingling in hands  or feet. Denies any recent fevers, infections, or recent hospitalizations. Patient reports appetite at 100% and energy level at 100%.     REVIEW OF SYSTEMS:  Review of Systems  Cardiovascular: Positive for leg swelling.     PAST MEDICAL/SURGICAL HISTORY:  Past Medical History:  Diagnosis Date  .  Diabetes ([place]) 01/20/2018  . HTN (hypertension)   . Hypothyroidism   . 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 COLONOSCOPY  07/27/2010   Procedure: COLONOSCOPY;  Surgeon: Kristin Peng, MD;  Location: [facility];  Service: Endoscopy;  Laterality: N/A;  . infertility surgery    . MOHS SURGERY     basal cell carcinoma  . PORTACATH PLACEMENT Left 04/01/2018   Procedure: INSERTION PORT-A-CATH;  Surgeon: Kristin Signs, MD;  Location: [facility] ORS;  Service: General;  Laterality: Left;  . REDUCTION MAMMAPLASTY Bilateral 2002     SOCIAL HISTORY:  Social History   Socioeconomic History  . Marital status: Married    Spouse name: Not on file  . Number of children: 0  . Years of education: Not on file  . Highest education level: Not on file  Occupational History  . Occupation: [employer]    Comment: Nurse  Social Needs  . Financial resource strain: Not on file  . Food insecurity:    Worry: Not on file    Inability: Not on file  . Transportation needs:    Medical: Not on file    Non-medical: Not on file  Tobacco Use  . Smoking status: Never Smoker  . Smokeless tobacco: Never Used  Substance and Sexual Activity  . Alcohol use: Yes    Comment: once or twice a year  . Drug use: No  . Sexual activity: Not on file  Lifestyle  . Physical activity:    Days per week: Not on file    Minutes per session: Not on file  . Stress: Not on file  Relationships  . Social connections:    Talks on phone: Not on file    Gets together: Not on file    Attends religious service: Not on file    Active member of club or organization: Not on file    Attends meetings of clubs or organizations: Not on file    Relationship status: Not on file  . Intimate partner violence:    Fear of current or ex partner: Not on file    Emotionally abused: Not on file    Physically abused: Not on file    Forced sexual activity: Not on file   Other Topics Concern  . Not on file  Social History Narrative  . Not on file    FAMILY HISTORY:  Family History  Problem Relation Age of Onset  . Heart disease Mother   . Diabetes Father   . Breast cancer Sister 73  . Breast cancer Maternal Aunt   . Colon cancer Neg Hx   . Liver disease Neg Hx     CURRENT MEDICATIONS:  Outpatient Encounter Medications as of 06/17/2018  Medication Sig Note  . atorvastatin (LIPITOR) 20 MG tablet Take 20 mg by mouth every morning.    . calcium-vitamin D (CALCIUM 500+D HIGH POTENCY) 500-400 MG-UNIT tablet Take 1 tablet by mouth 2 (two) times daily. 03/23/2018: On hold  . cyclophosphamide in sodium chloride 0.9 % 250 mL Inject 1,260 mg into the vein every 21 ( twenty-one) days.   Marland Kitchen  DOXOrubicin HCl (ADRIAMYCIN IV) Inject 84 mg into the vein every 21 ( twenty-one) days.   . ETOPOSIDE IV Inject 170 mg into the vein every 21 ( twenty-one) days. Days 1-3   . feeding supplement, GLUCERNA SHAKE, (GLUCERNA SHAKE) LIQD Take 237 mLs by mouth 3 (three) times daily between meals.   . furosemide (LASIX) 20 MG tablet Take 1 tablet (20 mg total) by mouth daily as needed.   Marland Kitchen levothyroxine (SYNTHROID) 75 MCG tablet TAKE 1 TABLT BY MOUTH ONCE DAILY IN THE MORNING ON AN EMPTY STOMACH WITH A GLASS OF WATER. 30-60 MINUTES BEFORE BREAKFAST.   Marland Kitchen lidocaine-prilocaine (EMLA) cream Apply 1 application topically as needed.   . loperamide (IMODIUM) 2 MG capsule Take 2 mg by mouth as needed for diarrhea or loose stools.   . magic mouthwash SOLN Take 5 mLs by mouth 3 (three) times daily as needed for mouth pain. With lidocaine 1% 1:1 solution   . magnesium oxide (MAG-OX) 400 (241.3 Mg) MG tablet TAKE 1 TABLET BY MOUTH TWICE DAILY   . metFORMIN (GLUCOPHAGE) 1000 MG tablet Take 1,000 mg by mouth 2 (two) times daily with a meal.    . pegfilgrastim (NEULASTA) 6 MG/0.6ML injection Inject 6 mg into the skin See admin instructions. To be administered 3 days after chemo therapy treatment   .  potassium chloride (K-DUR) 10 MEQ tablet Take 40 meq today and then 10 meq daily.   . potassium chloride (K-DUR) 10 MEQ tablet    . predniSONE (DELTASONE) 20 MG tablet TAKE 5 TABLETS BY MOUTH ONCE DAILY ON DAYS 1-5 OF CHEMOTHERAPY.   Marland Kitchen prochlorperazine (COMPAZINE) 10 MG tablet Take 1 tablet (10 mg total) by mouth every 6 (six) hours as needed for nausea or vomiting.   . valACYclovir (VALTREX) 500 MG tablet TAKE 1 TABLET BY MOUTH ONCE DAILY.   . vinCRIStine 2 mg in sodium chloride 0.9 % 50 mL Inject 2 mg into the vein every 21 ( twenty-one) days.   . vitamin B-12 (CYANOCOBALAMIN) 1000 MCG tablet Take 1 tablet (1,000 mcg total) by mouth daily.   . vitamin C (ASCORBIC ACID) 500 MG tablet Take 500 mg by mouth 2 (two) times daily.   Marland Kitchen zolpidem (AMBIEN) 10 MG tablet Take 1 tablet (10 mg total) by mouth at bedtime as needed for sleep.   . [DISCONTINUED] amoxicillin-clavulanate (AUGMENTIN) 875-125 MG tablet Take 1 tablet by mouth 2 (two) times daily.   . [DISCONTINUED] Glucosamine-Chondroit-Vit C-Mn (GLUCOSAMINE 1500 COMPLEX PO) Take 1 capsule by mouth 2 (two) times daily. 03/23/2018: On hold  . [DISCONTINUED] levofloxacin (LEVAQUIN) 500 MG tablet Take 1 tablet (500 mg total) by mouth daily for 7 days.    Facility-Administered Encounter Medications as of 06/17/2018  Medication  . Chlorhexidine Gluconate Cloth 2 % PADS 6 each   And  . Chlorhexidine Gluconate Cloth 2 % PADS 6 each  . diphenhydrAMINE (BENADRYL) injection 25 mg  . heparin lock flush 100 unit/mL  . sodium chloride flush (NS) 0.9 % injection 10 mL  . sodium chloride flush (NS) 0.9 % injection 10 mL    ALLERGIES:  Allergies  Allergen Reactions  . Doxycycline Anaphylaxis  . Penicillins Anaphylaxis    Did it involve swelling of the face/tongue/throat, SOB, or low BP? Yes-SOB Did it involve sudden or severe rash/hives, skin peeling, or any reaction on the inside of your mouth or nose? Unknown Did you need to seek medical attention at a  hospital or doctor's office? Yes When did it  last happen?Over 10 years ago If all above answers are "NO", may proceed with cephalosporin use.   . Levofloxacin Nausea And Vomiting  . Sulfa Antibiotics Itching and Rash     PHYSICAL EXAM:  ECOG Performance status: 1  Vitals:   06/17/18 1400  BP: 116/70  Pulse: (!) 123  Resp: 18  Temp: 98.2 F (36.8 C)  SpO2: 98%   Filed Weights   06/17/18 1400  Weight: 117 lb (53.1 kg)    Physical Exam Vitals Good reviewed.  Constitutional:      Appearance: Normal appearance.  Cardiovascular:     Rate and Rhythm: Tachycardia present.     Heart sounds: Normal heart sounds.  Pulmonary:     Effort: Pulmonary effort is normal.     Breath sounds: Normal breath sounds.  Abdominal:     General: There is no distension.     Palpations: Abdomen is soft. There is no mass.  Musculoskeletal:        General: Swelling present.  Skin:    General: Skin is warm.  Neurological:     General: No focal deficit present.     Mental Status: She is alert and oriented to person, place, and time.  Psychiatric:        Mood and Affect: Mood normal.        Behavior: Behavior normal.      LABORATORY DATA:  I have reviewed the labs as listed.  CBC    Component Value Date/Time   WBC 2.6 (L) 06/17/2018 1336   RBC 2.66 (L) 06/17/2018 1336   HGB 8.7 (L) 06/17/2018 1336   HCT 27.4 (L) 06/17/2018 1336   HCT 21.1 (L) 01/19/2018 0747   PLT 55 (L) 06/17/2018 1336   MCV 103.0 (H) 06/17/2018 1336   MCH 32.7 06/17/2018 1336   MCHC 31.8 06/17/2018 1336   RDW 20.7 (H) 06/17/2018 1336   LYMPHSABS 0.5 (L) 06/17/2018 1336   MONOABS 0.4 06/17/2018 1336   EOSABS 0.0 06/17/2018 1336   BASOSABS 0.0 06/17/2018 1336   CMP Latest Ref Rng & Units 06/17/2018 06/10/2018 06/01/2018  Glucose 70 - 99 mg/dL 281(H) 197(H) 233(H)  BUN 6 - 20 mg/dL 17 24(H) 18  Creatinine 0.44 - 1.00 mg/dL 0.74 0.70 0.67  Sodium 135 - 145 mmol/L 139 142 145  Potassium 3.5 - 5.1 mmol/L 3.8  4.6 3.0(L)  Chloride 98 - 111 mmol/L 103 102 110  CO2 22 - 32 mmol/L '23 26 24  '$ Calcium 8.9 - 10.3 mg/dL 8.8(L) 9.4 8.8(L)  Total Protein 6.5 - 8.1 g/dL 5.8(L) 5.9(L) 5.5(L)  Total Bilirubin 0.3 - 1.2 mg/dL 0.6 0.7 0.5  Alkaline Phos 38 - 126 U/L 88 90 100  AST 15 - 41 U/L 10(L) 19 15  ALT 0 - 44 U/L '11 17 22       '$ DIAGNOSTIC IMAGING:  I have independently reviewed the scans and discussed with the patient.   I have reviewed Venita Lick LPN's note and agree with the documentation.  I personally performed a face-to-face visit, made revisions and my assessment and plan is as follows.    ASSESSMENT & PLAN:   Lymphoma, peripheral T-cell, spleen ([facility]) 1.  PTCL, NOS: -Presentation with pancytopenia, night sweats and fevers for last 1 to 2 months. - CT CAP shows splenomegaly with no lymphadenopathy. 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  T cells do not express TDT,  CD1a, CD10, CD15, CD30, CD34, CD56, CD 117 or CD99.  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 - Prognostic index for PTCL (PIT) has 2 positive factors including serum LDH above normal and bone marrow involvement. - She is not a candidate for brentuximab-based regimen as CD30 was negative. -We talked about first-line therapy with CHOP-based regimen.  I have also talked to her about CHOEP regimen which has shown to be helpful for patients less than 60 and normal LDH. - We will obtain a baseline PET CT scan. - She has a PICC line placed for chemotherapy administration. -2D echo on 01/24/2018 shows ejection fraction of 65 to 70%. -PET/CT scan on 02/02/2018 shows splenomegaly with no hypermetabolism.  No enlarged or hypermetabolic adenopathy in the neck chest abdomen or pelvis. -PET/CT scan on 04/13/2018 shows no FDG mass or adenopathy.  Decrease in size of the spleen measuring 13 cm, previously 16  cm.  Considerable heterogeneous uptake throughout the axial and appendicular skeleton most notably in the thoracic and lumbar spine.  I think this is from G-CSF. -Bone marrow biopsy on 04/16/2018 shows atypical T-cell proliferation, persistent.  The extensive nature of T-cell infiltrate is concerning for a neoplastic T-cell lymphoproliferative process.  T-cell receptor PCR is polyclonal.  Additional analysis for expression of BCL 6, CXCL13 and PD1 were sent. -6 cycles of CHOEP from 01/28/2018 through 05/20/2018. -She was recently hospitalized on 05/29/2018.  She required antibiotics including Levaquin which she completed yesterday. -Her cough is better.  She is walking at least 1 mile per day.  She is also gained 45 pounds. - She has an appointment to see Dr. Minna Antis at [facility] on 07/07/2018.  -We reviewed results of the PET CT scan dated 06/15/2018 which did not show any evidence of recurrent lymphoma.  Splenomegaly measuring about 17 cm.  Bilateral renal stones present.  We have also reviewed her blood counts.  They are improving slowly. -We will see her back in 5 weeks for follow-up.  2.  Hypomagnesemia: She will continue magnesium 400 mg twice daily.  3.  Hypokalemia: -Potassium is normal today.  She will continue potassium 20 mg daily.       Total time spent is 25 minutes with more than 50% of the time spent face-to-face discussing scan results and coordination of care.    Orders placed this encounter:  No orders of the defined types were placed in this encounter.     Derek Jack, MD [place] [phone]

## 2018-06-17 NOTE — Patient Instructions (Signed)
[facility] at [facility] Discharge Instructions  You were seen today by Dr. Katragadda. He went over your recent lab results. He will see you back in 5 weeks for labs and follow up.   Thank you for choosing  Cancer Center at [facility] to provide your oncology and hematology care.  To afford each patient quality time with our provider, please arrive at least 15 minutes before your scheduled appointment time.   If you have a lab appointment with the Cancer Center please come in thru the  Main Entrance and check in at the main information desk  You need to re-schedule your appointment should you arrive 10 or more minutes late.  We strive to give you quality time with our providers, and arriving late affects you and other patients whose appointments are after yours.  Also, if you no show three or more times for appointments you may be dismissed from the clinic at the providers discretion.     Again, thank you for choosing [facility].  Our hope is that these requests will decrease the amount of time that you wait before being seen by our physicians.       _____________________________________________________________  Should you have questions after your visit to  Cancer Center, please contact our office at [phone] between the hours of 8:00 a.m. and 4:30 p.m.  Voicemails left after 4:00 p.m. will not be returned until the following business day.  For prescription refill requests, have your pharmacy contact our office and allow 72 hours.    Cancer Center Support Programs:   > Cancer Support Group  2nd Tuesday of the month 1pm-2pm, Journey Room    

## 2018-06-29 ENCOUNTER — Telehealth (HOSPITAL_COMMUNITY): Payer: Self-pay | Admitting: Hematology

## 2018-07-08 ENCOUNTER — Other Ambulatory Visit (HOSPITAL_COMMUNITY): Payer: Self-pay | Admitting: Hematology

## 2018-07-14 ENCOUNTER — Other Ambulatory Visit (HOSPITAL_COMMUNITY): Payer: Self-pay | Admitting: Hematology

## 2018-07-15 ENCOUNTER — Other Ambulatory Visit (HOSPITAL_COMMUNITY): Payer: Self-pay | Admitting: *Deleted

## 2018-07-22 ENCOUNTER — Ambulatory Visit (HOSPITAL_COMMUNITY): Payer: Commercial Managed Care - PPO | Admitting: Hematology

## 2018-07-22 ENCOUNTER — Other Ambulatory Visit (HOSPITAL_COMMUNITY): Payer: Commercial Managed Care - PPO

## 2018-08-06 ENCOUNTER — Other Ambulatory Visit (HOSPITAL_COMMUNITY): Payer: Self-pay | Admitting: Nurse Practitioner

## 2018-08-10 ENCOUNTER — Other Ambulatory Visit (HOSPITAL_COMMUNITY): Payer: Self-pay | Admitting: *Deleted

## 2018-08-10 DIAGNOSIS — C8447 Peripheral T-cell lymphoma, not classified, spleen: Secondary | ICD-10-CM

## 2018-08-10 MED ORDER — MAGIC MOUTHWASH: 5.0000 mL | Freq: Three times a day (TID) | ORAL | 0 refills | Status: AC | PRN

## 2018-08-14 ENCOUNTER — Other Ambulatory Visit (HOSPITAL_COMMUNITY): Payer: Self-pay | Admitting: Hematology

## 2018-09-09 ENCOUNTER — Other Ambulatory Visit (HOSPITAL_COMMUNITY): Payer: Self-pay | Admitting: Hematology

## 2018-09-23 ENCOUNTER — Inpatient Hospital Stay: Admission: RE | Admit: 2018-09-23 | Payer: Commercial Managed Care - PPO | Source: Ambulatory Visit

## 2018-09-28 ENCOUNTER — Other Ambulatory Visit: Payer: Commercial Managed Care - PPO

## 2018-11-16 ENCOUNTER — Other Ambulatory Visit: Payer: Self-pay | Admitting: Family Medicine

## 2019-01-11 ENCOUNTER — Other Ambulatory Visit (HOSPITAL_COMMUNITY): Payer: Self-pay | Admitting: *Deleted

## 2019-04-30 ENCOUNTER — Other Ambulatory Visit (HOSPITAL_COMMUNITY): Payer: Self-pay | Admitting: Nurse Practitioner

## 2019-04-30 DIAGNOSIS — C8447 Peripheral T-cell lymphoma, not classified, spleen: Secondary | ICD-10-CM

## 2019-06-11 ENCOUNTER — Other Ambulatory Visit (HOSPITAL_COMMUNITY): Payer: Self-pay | Admitting: Nurse Practitioner

## 2020-01-29 IMAGING — US US ABDOMEN COMPLETE
1 series · 13 of 25 positions shown · non-contrast
Comparison: 08/09/10

CLINICAL DATA: Acute kidney injury.

EXAM:
ABDOMEN ULTRASOUND COMPLETE

[Series 1: us abdomen complete · 13 of 228 slices shown]
[im 1/228]
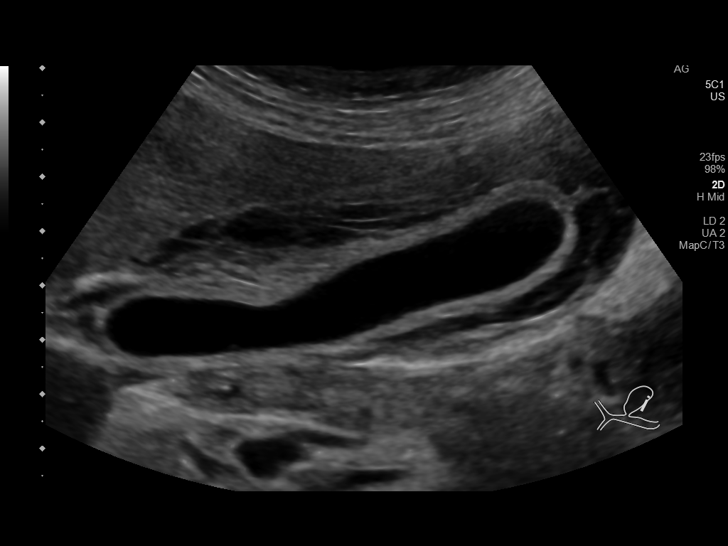
[im 19/228]
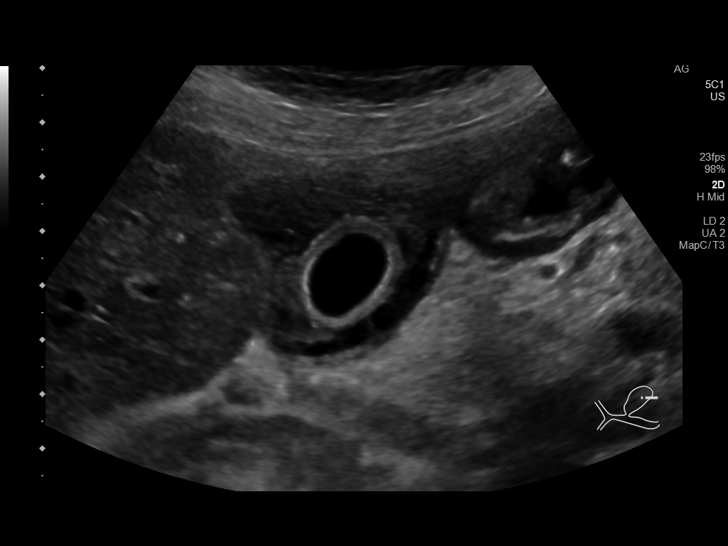
[im 38/228]
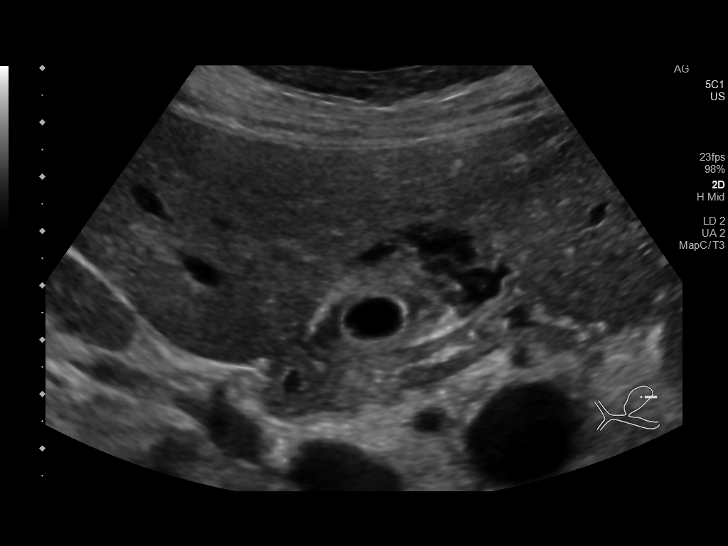
[im 57/228]
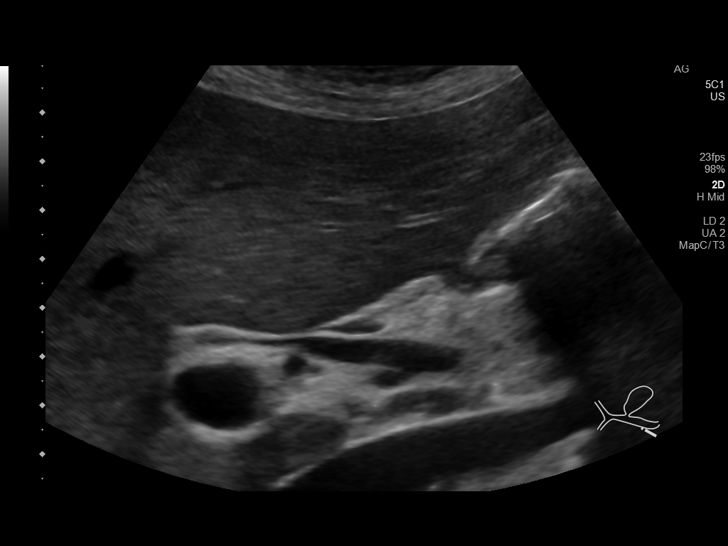
[im 76/228]
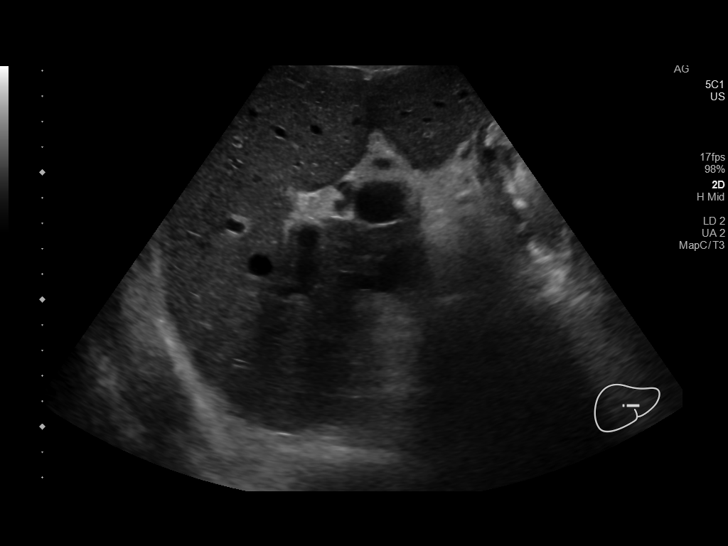
[im 95/228]
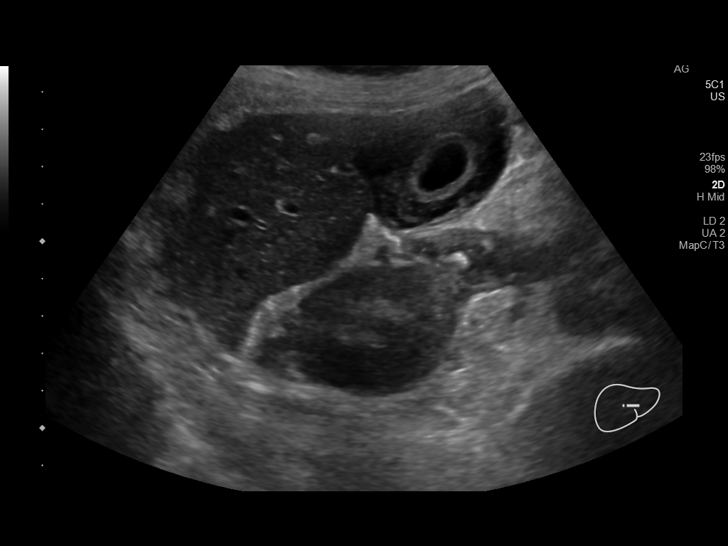
[im 114/228]
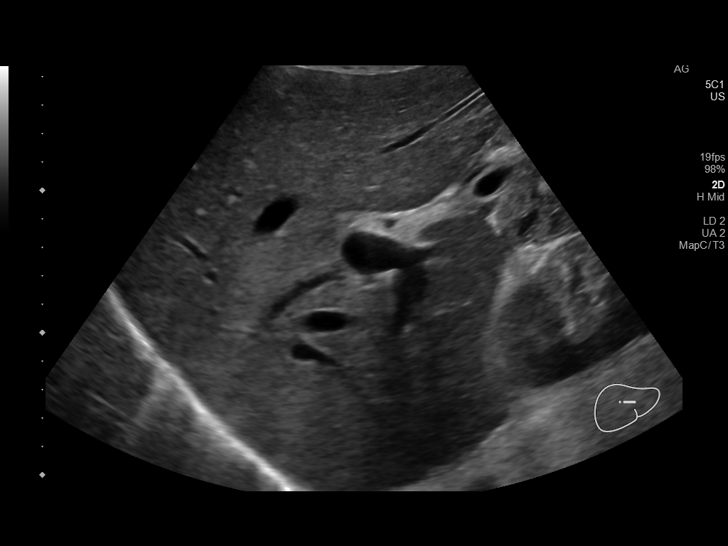
[im 133/228]
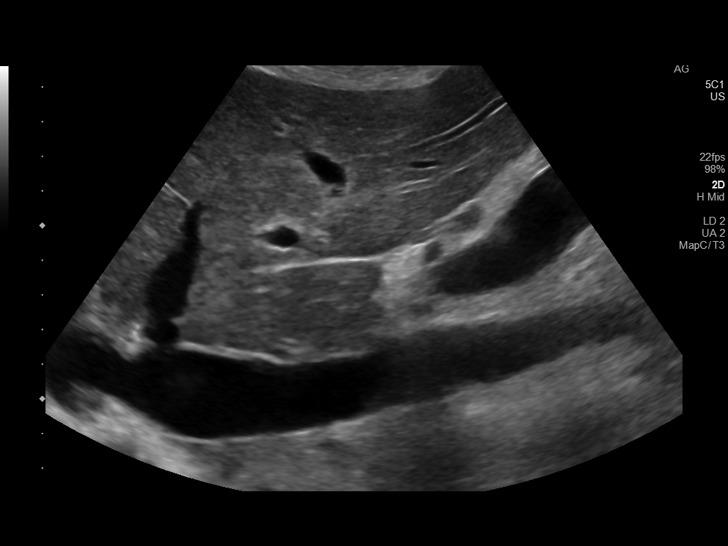
[im 152/228]
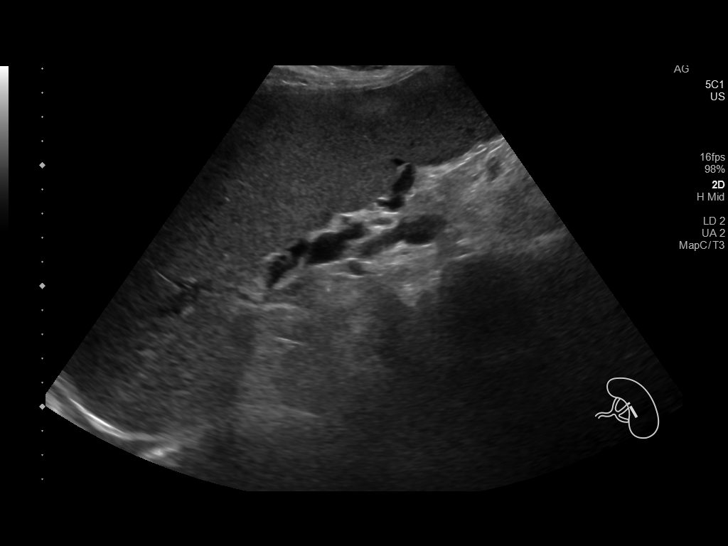
[im 171/228]
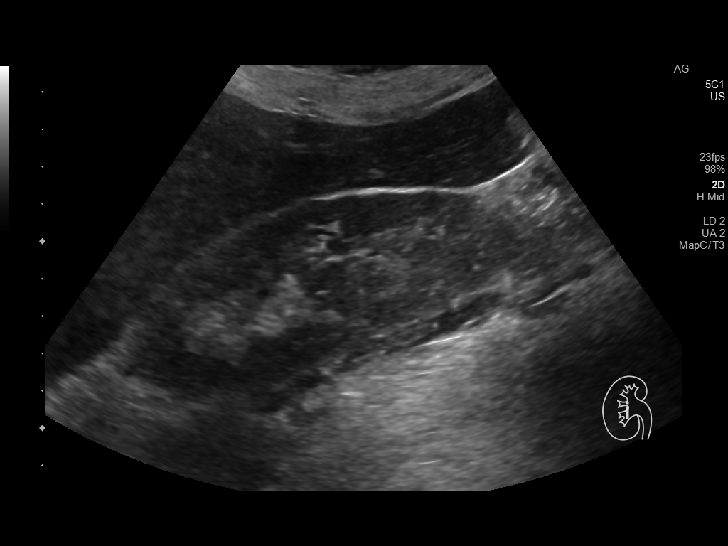
[im 190/228]
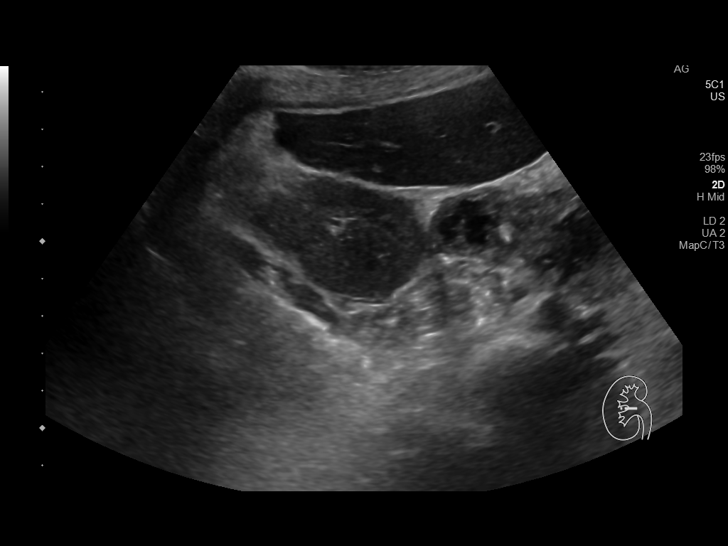
[im 209/228]
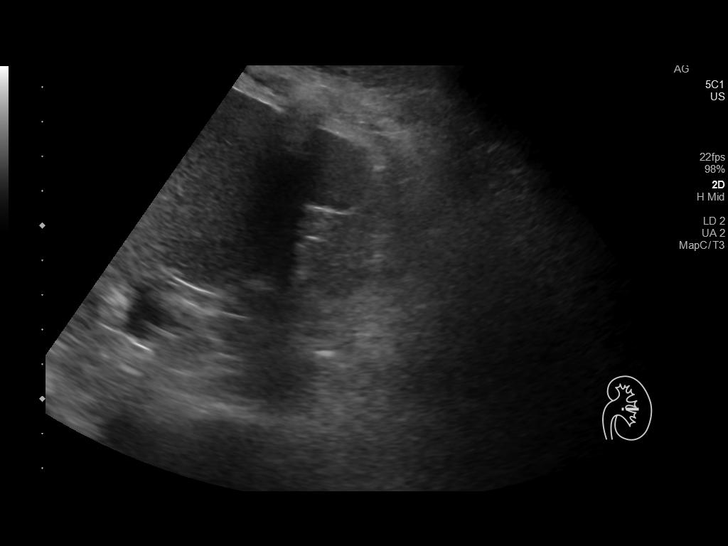
[im 228/228]
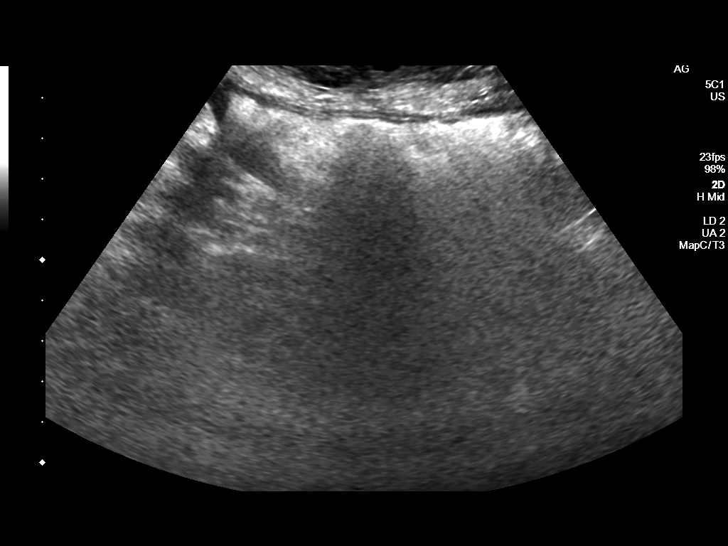

[13 of 25 positions shown; findings below may reference images not displayed]

FINDINGS: Gallbladder: The gallbladder wall is diffusely thickened measuring
13.4 mm in thickness. No gallstones or pericholecystic fluid.
Negative sonographic Murphy's sign.

Common bile duct: Diameter: 4.4 mm.

Liver: No focal lesion identified. There is increased echogenicity
of the portal triads diffusely.. Portal vein is patent on color
Doppler imaging with normal direction of blood flow towards the
liver.

IVC: No abnormality visualized.

Pancreas: Visualized portion unremarkable.

Spleen: 20.1 cm in length with a volume of 2105 cc

Right Kidney: Length: 11.8 cm. Echogenicity within normal limits. No
mass or hydronephrosis visualized.

Left Kidney: Length: 11.8 cm. Echogenicity within normal limits. No
mass or hydronephrosis visualized.

Abdominal aorta: No aneurysm visualized.

Other findings: None.
IMPRESSION: 1. There is marked splenomegaly.
2. Diffuse increased echogenicity of the portal triads which may be
seen with liver inflammation. The
3. Marked diffuse gallbladder wall thickening. No gallstones,
gallbladder sludge or sonographic Murphy's sign. This is a
nonspecific finding and although may be seen with cholecystitis if
there is a history of portal venous hypertension due to cirrhosis or
fluid overload this could result in edematous appearing gallbladder.
Clinical correlation is advised.
4. No hydronephrosis identified bilaterally.

## 2020-02-02 IMAGING — CR DG CHEST 1V PORT
1 series · 1 of 1 positions shown · non-contrast
Comparison: 01/18/2017

CLINICAL DATA: PICC line

EXAM:
PORTABLE CHEST 1 VIEW

[ap portable]
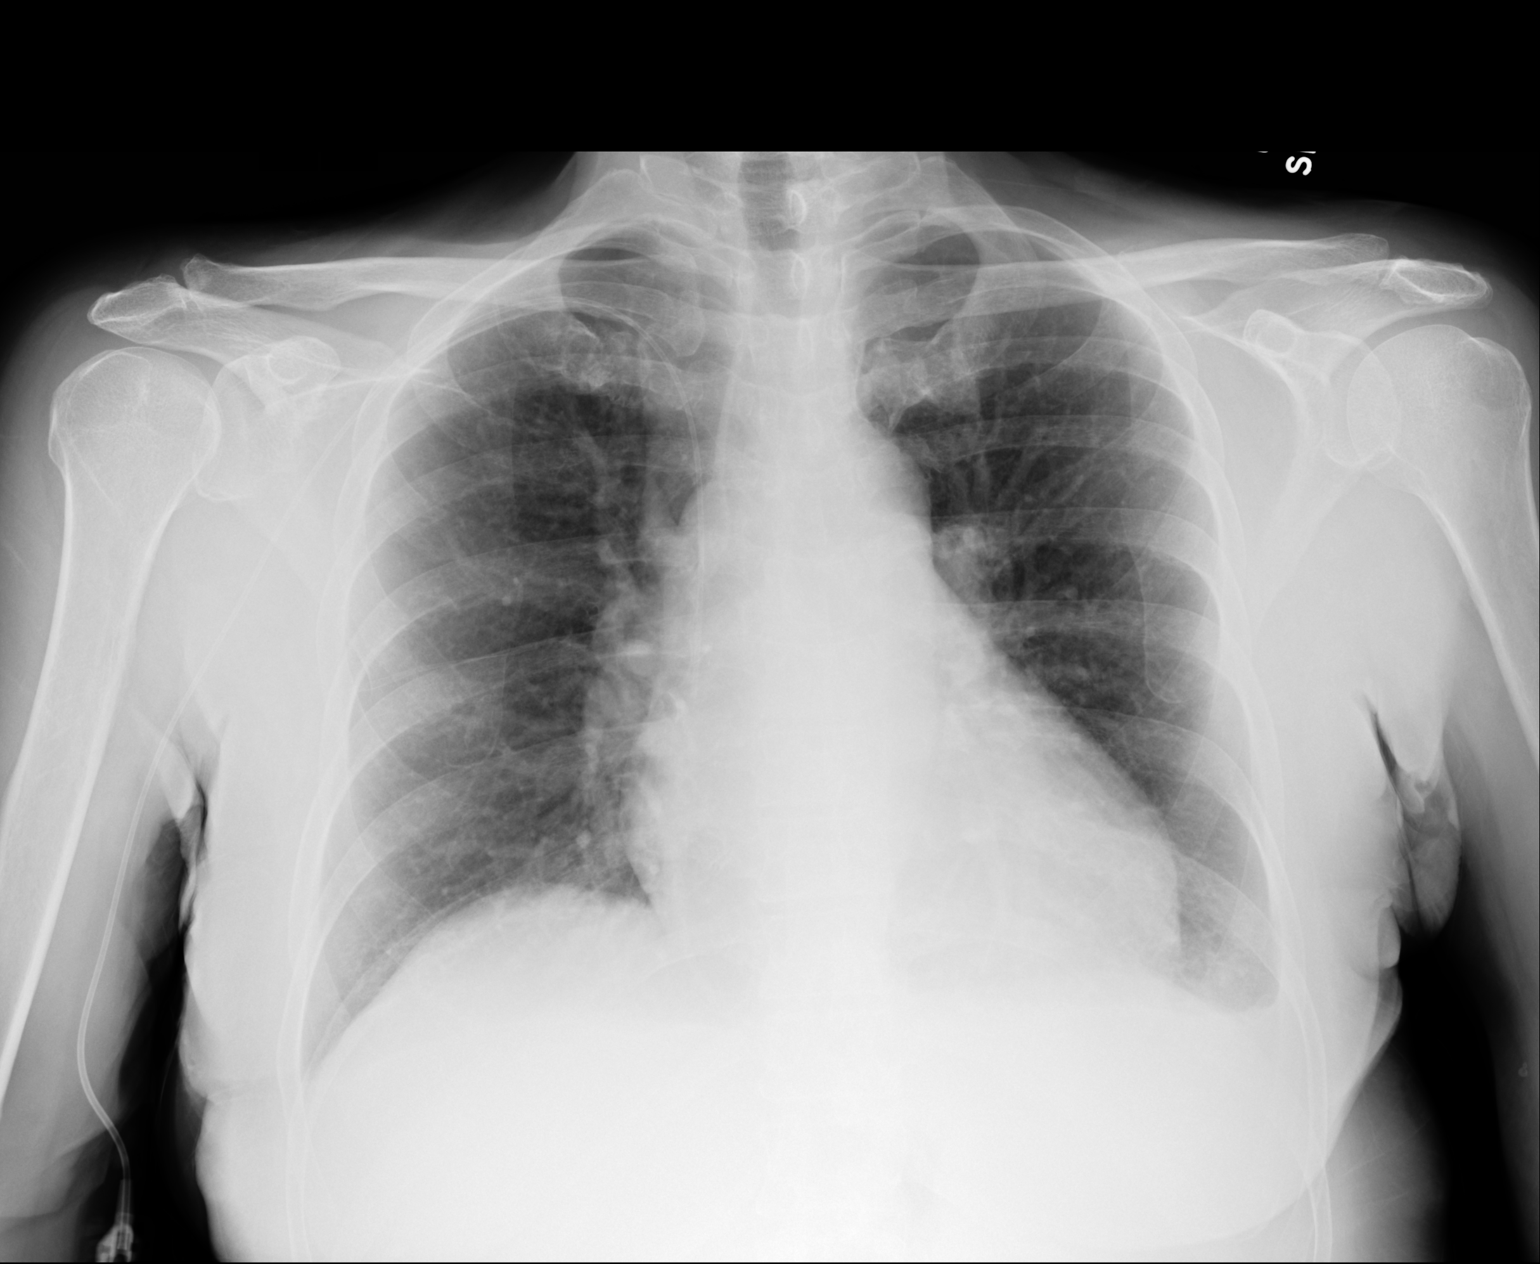

[1 of 1 positions shown; findings below may reference images not displayed]

FINDINGS: Right upper extremity catheter tip projects over the cavoatrial
region. There is a small left-sided pleural effusion. No focal
consolidation. Normal heart size. No pneumothorax.
IMPRESSION: 1. Right upper extremity catheter tip over the cavoatrial region
2. Small left effusion

## 2020-02-12 IMAGING — CT NM PET TUM IMG INITIAL (PI) SKULL BASE T - THIGH
1 of 9 series · 1 of 25 positions shown · non-contrast
Comparison: CT scan 01/20/2018

CLINICAL DATA: Initial treatment strategy for T-cell lymphoma of
the spleen..

EXAM:
NUCLEAR MEDICINE PET SKULL BASE TO THIGH
TECHNIQUE: 6.62 mCi F-18 FDG was injected intravenously. Full-ring PET imaging
was performed from the skull base to thigh after the radiotracer. CT
data was obtained and used for attenuation correction and anatomic
localization.
Fasting blood glucose: 94 mg/dl

[Series 3: ct wb 5.0 b30f · axial · 5.0mm · 0.98mm/px · 1 of 290 slices shown]
[im 290/290  brain]
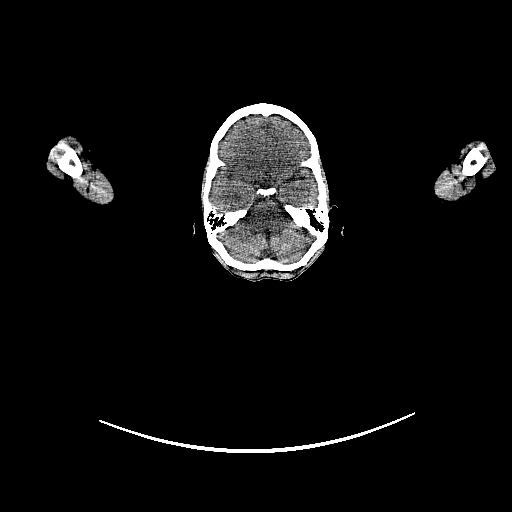

[1 of 25 positions shown; findings below may reference images not displayed]

FINDINGS: Mediastinal blood pool activity: SUV max

NECK: No hypermetabolic lymph nodes in the neck.

Incidental CT findings: Multinodular thyroid goiter noted.

CHEST: No hypermetabolic mediastinal or hilar nodes. No suspicious
pulmonary nodules on the CT scan.

Incidental CT findings: Three-vessel coronary artery calcifications
are noted. Right-sided PICC line in good position without
complicating features. Aortic calcifications but no aneurysm.

ABDOMEN/PELVIS: No abnormal hypermetabolic activity within the
liver, pancreas, adrenal glands, or spleen. No hypermetabolic lymph
nodes in the abdomen or pelvis.

Splenomegaly but no focal splenic lesions or areas of
hypermetabolism. The spleen is self is not hypermetabolic.

Incidental CT findings: Mild gallbladder distension.

Moderate atherosclerotic calcifications involving the aorta.

Enlarged fibroid uterus.

SKELETON: No focal hypermetabolic activity to suggest skeletal
metastasis.

Incidental CT findings: none
IMPRESSION: 1. Splenomegaly but the spleen is not hypermetabolic and there are
no focal hypermetabolic lesions.
2. No enlarged or hypermetabolic lymphadenopathy in the neck, chest,
abdomen or pelvis.

## 2020-07-11 ENCOUNTER — Encounter: Payer: Self-pay | Admitting: Internal Medicine

## 2021-01-12 ENCOUNTER — Other Ambulatory Visit: Payer: Self-pay

## 2021-01-12 ENCOUNTER — Encounter: Payer: Self-pay | Admitting: Ophthalmology

## 2021-01-15 ENCOUNTER — Encounter (HOSPITAL_COMMUNITY): Payer: Self-pay | Admitting: Hematology

## 2021-01-17 ENCOUNTER — Encounter (HOSPITAL_COMMUNITY): Payer: Self-pay | Admitting: Hematology

## 2021-01-17 NOTE — Discharge Instructions (Signed)

## 2021-01-22 ENCOUNTER — Encounter: Payer: Self-pay | Admitting: Ophthalmology

## 2021-01-22 ENCOUNTER — Ambulatory Visit: Payer: Medicare Other | Admitting: Anesthesiology

## 2021-01-22 ENCOUNTER — Ambulatory Visit
Admission: RE | Admit: 2021-01-22 | Discharge: 2021-01-22 | Disposition: A | Discharge status: Home / Self Care | Payer: Medicare Other | Attending: Ophthalmology | Admitting: Ophthalmology

## 2021-01-22 ENCOUNTER — Other Ambulatory Visit: Payer: Self-pay

## 2021-01-22 ENCOUNTER — Encounter
Admission: RE | Disposition: A | Discharge status: Home / Self Care | Payer: Self-pay | Source: Home / Self Care | Attending: Ophthalmology

## 2021-01-22 DIAGNOSIS — E039 Hypothyroidism, unspecified: Secondary | ICD-10-CM | POA: Insufficient documentation

## 2021-01-22 DIAGNOSIS — E1136 Type 2 diabetes mellitus with diabetic cataract: Secondary | ICD-10-CM | POA: Insufficient documentation

## 2021-01-22 DIAGNOSIS — I10 Essential (primary) hypertension: Secondary | ICD-10-CM | POA: Insufficient documentation

## 2021-01-22 DIAGNOSIS — Z856 Personal history of leukemia: Secondary | ICD-10-CM | POA: Insufficient documentation

## 2021-01-22 DIAGNOSIS — H2511 Age-related nuclear cataract, right eye: Secondary | ICD-10-CM | POA: Diagnosis not present

## 2021-01-22 HISTORY — PX: CATARACT EXTRACTION W/PHACO: SHX586

## 2021-01-22 LAB — GLUCOSE, CAPILLARY
Glucose-Capillary: 106 mg/dL — ABNORMAL HIGH (ref 70–99)
Glucose-Capillary: 105 mg/dL — ABNORMAL HIGH (ref 70–99)

## 2021-01-22 SURGERY — PHACOEMULSIFICATION, CATARACT, WITH IOL INSERTION
Anesthesia: Monitor Anesthesia Care | Site: Eye | Laterality: Right

## 2021-01-22 MED ORDER — FENTANYL CITRATE (PF) 100 MCG/2ML IJ SOLN
INTRAMUSCULAR | Status: DC | PRN
  Administered 2021-01-22: 50 ug via INTRAVENOUS

## 2021-01-22 MED ORDER — SIGHTPATH DOSE#1 SODIUM HYALURONATE 10 MG/ML IO SOLUTION
PREFILLED_SYRINGE | INTRAOCULAR | Status: DC | PRN
  Administered 2021-01-22: 0.85 mL via INTRAOCULAR

## 2021-01-22 MED ORDER — ONDANSETRON HCL 4 MG/2ML IJ SOLN: 4.0000 mg | Freq: Once | INTRAMUSCULAR | Status: DC | PRN

## 2021-01-22 MED ORDER — MIDAZOLAM HCL 2 MG/2ML IJ SOLN
INTRAMUSCULAR | Status: DC | PRN
  Administered 2021-01-22: 1 mg via INTRAVENOUS

## 2021-01-22 MED ORDER — LIDOCAINE HCL (PF) 2 % IJ SOLN
INTRAOCULAR | Status: DC | PRN
  Administered 2021-01-22: 1 mL via INTRAOCULAR

## 2021-01-22 MED ORDER — SIGHTPATH DOSE#1 SODIUM HYALURONATE 23 MG/ML IO SOLUTION
PREFILLED_SYRINGE | INTRAOCULAR | Status: DC | PRN
  Administered 2021-01-22: 0.6 mL via INTRAOCULAR

## 2021-01-22 MED ORDER — SIGHTPATH DOSE#1 BSS IO SOLN
INTRAOCULAR | Status: DC | PRN
  Administered 2021-01-22: 15 mL

## 2021-01-22 MED ORDER — ACETAMINOPHEN 160 MG/5ML PO SOLN: 975.0000 mg | Freq: Once | ORAL | Status: DC | PRN

## 2021-01-22 MED ORDER — SIGHTPATH DOSE#1 BSS IO SOLN
INTRAOCULAR | Status: DC | PRN
  Administered 2021-01-22: 74 mL via OPHTHALMIC

## 2021-01-22 MED ORDER — TETRACAINE HCL 0.5 % OP SOLN
1.0000 [drp] | OPHTHALMIC | Status: DC | PRN
  Administered 2021-01-22 (×3): 1 [drp] via OPHTHALMIC

## 2021-01-22 MED ORDER — LACTATED RINGERS IV SOLN: INTRAVENOUS | Status: DC

## 2021-01-22 MED ORDER — ARMC OPHTHALMIC DILATING DROPS
1.0000 "application " | OPHTHALMIC | Status: DC | PRN
  Administered 2021-01-22 (×3): 1 via OPHTHALMIC

## 2021-01-22 MED ORDER — MOXIFLOXACIN HCL 0.5 % OP SOLN
OPHTHALMIC | Status: DC | PRN
  Administered 2021-01-22: 0.2 mL via OPHTHALMIC

## 2021-01-22 MED ORDER — ACETAMINOPHEN 500 MG PO TABS: 1000.0000 mg | ORAL_TABLET | Freq: Once | ORAL | Status: DC | PRN

## 2021-01-22 SURGICAL SUPPLY — 14 items
CATARACT SUITE SIGHTPATH (MISCELLANEOUS) ×2 IMPLANT
DISSECTOR HYDRO NUCLEUS 50X22 (MISCELLANEOUS) ×2 IMPLANT
FEE CATARACT SUITE SIGHTPATH (MISCELLANEOUS) ×1 IMPLANT
GLOVE SURG GAMMEX PI TX LF 7.5 (GLOVE) ×2 IMPLANT
GLOVE SURG SYN 8.5  E (GLOVE) ×2
GLOVE SURG SYN 8.5 E (GLOVE) ×1 IMPLANT
GLOVE SURG SYN 8.5 PF PI (GLOVE) ×1 IMPLANT
LENS IOL TECNIS EYHANCE 21.0 (Intraocular Lens) ×1 IMPLANT
NDL FILTER BLUNT 18X1 1/2 (NEEDLE) ×1 IMPLANT
NEEDLE FILTER BLUNT 18X 1/2SAF (NEEDLE) ×1
NEEDLE FILTER BLUNT 18X1 1/2 (NEEDLE) ×1 IMPLANT
SYR 3ML LL SCALE MARK (SYRINGE) ×2 IMPLANT
SYR 5ML LL (SYRINGE) ×2 IMPLANT
WATER STERILE IRR 250ML POUR (IV SOLUTION) ×2 IMPLANT

## 2021-01-22 NOTE — Transfer of Care (Signed)
Immediate Anesthesia Transfer of Care Note  Patient: Kristin Good  Procedure(s) Performed: CATARACT EXTRACTION PHACO AND INTRAOCULAR LENS PLACEMENT (IOC) DM RIGHT INTRAVITREAL KENALOG INJECTION 4.34 00:33.4 (Right: Eye)  Patient Location: PACU  Anesthesia Type: MAC  Level of Consciousness: awake, alert  and patient cooperative  Airway and Oxygen Therapy: Patient Spontanous Breathing and Patient connected to supplemental oxygen  Post-op Assessment: Post-op Vital signs reviewed, Patient's Cardiovascular Status Stable, Respiratory Function Stable, Patent Airway and No signs of Nausea or vomiting  Post-op Vital Signs: Reviewed and stable  Complications: No notable events documented.

## 2021-01-22 NOTE — Anesthesia Preprocedure Evaluation (Signed)
Anesthesia Evaluation  Patient identified by MRN, date of birth, ID band Patient awake    Reviewed: Allergy & Precautions, H&P , NPO status , Patient's Chart, lab work & pertinent test results, reviewed documented beta blocker date and time   Airway Mallampati: II  TM Distance: >3 FB Neck ROM: full    Dental no notable dental hx.    Pulmonary neg pulmonary ROS,    Pulmonary exam normal breath sounds clear to auscultation       Cardiovascular Exercise Tolerance: Good hypertension, negative cardio ROS   Rhythm:regular Rate:Normal     Neuro/Psych negative neurological ROS  negative psych ROS   GI/Hepatic negative GI ROS, Neg liver ROS,   Endo/Other  negative endocrine ROSdiabetesHypothyroidism   Renal/GU negative Renal ROS  negative genitourinary   Musculoskeletal   Abdominal   Peds  Hematology negative hematology ROS (+) Blood dyscrasia (h/o leukemia (atypical Tcell)), ,   Anesthesia Other Findings   Reproductive/Obstetrics negative OB ROS                             Anesthesia Physical Anesthesia Plan  ASA: 3  Anesthesia Plan: MAC   Post-op Pain Management:    Induction:   PONV Risk Score and Plan: 2 and TIVA, Midazolam and Treatment may vary due to age or medical condition  Airway Management Planned:   Additional Equipment:   Intra-op Plan:   Post-operative Plan:   Informed Consent: I have reviewed the patients History and Physical, chart, labs and discussed the procedure including the risks, benefits and alternatives for the proposed anesthesia with the patient or authorized representative who has indicated his/her understanding and acceptance.     Dental Advisory Given  Plan Discussed with: CRNA  Anesthesia Plan Comments:         Anesthesia Quick Evaluation

## 2021-01-22 NOTE — Op Note (Signed)
OPERATIVE NOTE  Kristin Good [id number] 01/22/2021   PREOPERATIVE DIAGNOSIS:   Nuclear sclerotic cataract right eye.  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 L39.0300   POSTOPERATIVE DIAGNOSIS:    same     PROCEDURE:   CPT [phone] Phacoemusification with posterior chamber intraocular lens placement of the right eye  CPT 67028 Intravitreal injection kenalog, right eye.  LENS:   Implant Name Type Inv. Item Serial No. Manufacturer Lot No. LRB No. Used Action  LENS IOL TECNIS EYHANCE 21.0 - [id number] Intraocular Lens LENS IOL TECNIS EYHANCE 21.0 [id number] SIGHTPATH  Right 1 Implanted       Procedure(s) with comments: CATARACT EXTRACTION PHACO AND INTRAOCULAR LENS PLACEMENT (IOC) DM RIGHT INTRAVITREAL KENALOG INJECTION 4.34 00:33.4 (Right) - Diabetic  DIB00 +21.0   ULTRASOUND TIME: 0 minutes 33 seconds.  CDE 4.34   SURGEON:  Benay Pillow, MD, MPH  ANESTHESIOLOGIST: Anesthesiologist: April Manson, MD CRNA: Cameron Ali, CRNA   ANESTHESIA:  Topical with tetracaine drops augmented with 1% preservative-free intracameral lidocaine.  ESTIMATED BLOOD LOSS: less than 1 mL.   COMPLICATIONS:  None.   DESCRIPTION OF PROCEDURE:  The patient was identified in the holding room and transported to the operating room and placed in the supine position under the operating microscope.  The right eye was identified as the operative eye and it was prepped and draped in the usual sterile ophthalmic fashion.   A 1.0 millimeter clear-corneal paracentesis was made at the 10:30 position. 0.5 ml of preservative-free 1% lidocaine with epinephrine was injected into the anterior chamber.  The anterior chamber was filled with Healon 5 viscoelastic.  A 2.4 millimeter keratome was used to make a near-clear corneal incision at the 8:00 position.  A curvilinear capsulorrhexis was made with a cystotome and capsulorrhexis forceps.  Balanced salt solution was used to hydrodissect and hydrodelineate the  nucleus.   Phacoemulsification was then used in stop and chop fashion to remove the lens nucleus and epinucleus.  The remaining cortex was then removed using the irrigation and aspiration handpiece. Healon was then placed into the capsular bag to distend it for lens placement.  A lens was then injected into the capsular bag.    Calipers were used to mark 3.5 mm posterior to the limbus in the inferotemporal quadrant.  0.1 mL of Kenalog 40 mg/mL was injected into the vitreous cavity.   The remaining viscoelastic was aspirated.   Wounds were hydrated with balanced salt solution.  The anterior chamber was inflated to a physiologic pressure with balanced salt solution.   Intracameral vigamox 0.1 mL undiluted was injected into the eye and a drop placed onto the ocular surface.  No wound leaks were noted.  The patient was taken to the recovery room in stable condition without complications of anesthesia or surgery  Benay Pillow 01/22/2021, 7:59 AM

## 2021-01-22 NOTE — Anesthesia Postprocedure Evaluation (Signed)
Anesthesia Post Note  Patient: Kristin Good  Procedure(s) Performed: CATARACT EXTRACTION PHACO AND INTRAOCULAR LENS PLACEMENT (IOC) DM RIGHT INTRAVITREAL KENALOG INJECTION 4.34 00:33.4 (Right: Eye)     Patient location during evaluation: PACU Anesthesia Type: MAC Level of consciousness: awake and alert Pain management: pain level controlled Vital Signs Assessment: post-procedure vital signs reviewed and stable Respiratory status: spontaneous breathing, nonlabored ventilation and respiratory function stable Cardiovascular status: stable and blood pressure returned to baseline Postop Assessment: no apparent nausea or vomiting Anesthetic complications: no   No notable events documented.  April Manson

## 2021-01-22 NOTE — Anesthesia Procedure Notes (Signed)
Procedure Name: MAC Date/Time: 01/22/2021 7:46 AM Performed by: Cameron Ali, CRNA Pre-anesthesia Checklist: Patient identified, Emergency Drugs available, Suction available, Timeout performed and Patient being monitored Patient Re-evaluated:Patient Re-evaluated prior to induction Oxygen Delivery Method: Nasal cannula Placement Confirmation: positive ETCO2

## 2021-01-22 NOTE — H&P (Signed)
[facility]   Primary Care Physician:  Marinda Elk, MD Ophthalmologist: Dr. Benay Pillow  Pre-Procedure History & Physical: HPI:  Kristin Good is a 61 y.o. female here for cataract surgery.   Past Medical History:  Diagnosis Date   Diabetes ([place]) 01/20/2018   Heart murmur    "   HTN (hypertension)    Hypothyroidism    Leukemia ([place])    Atypical T cell lymphoma    Past Surgical History:  Procedure Laterality Date   COLONOSCOPY  07/27/2010   Procedure: COLONOSCOPY;  Surgeon: Dorothyann Peng, MD;  [facility];  Service: Endoscopy;  Laterality: N/A;   infertility surgery     MOHS SURGERY     basal cell carcinoma   PORTACATH PLACEMENT Left 04/01/2018   Procedure: INSERTION PORT-A-CATH;  Surgeon: Aviva Signs, MD;  Location: [facility];  Service: General;  Laterality: Left;   REDUCTION MAMMAPLASTY Bilateral 2002    Prior to Admission medications   Medication Sig Start Date End Date Taking? Authorizing Provider  acetaminophen (TYLENOL) 325 MG tablet Take 650 mg by mouth every 6 (six) hours as needed.   Yes [provider]  acyclovir (ZOVIRAX) 400 MG tablet Take 400 mg by mouth 5 (five) times daily.   Yes [provider]  Alemtuzumab (CAMPATH IV) Inject into the vein. Every 3 weeks   Yes [provider]  ALPRAZolam (XANAX) 1 MG tablet Take 1 mg by mouth at bedtime as needed for anxiety.   Yes [provider]  atorvastatin (LIPITOR) 20 MG tablet Take 20 mg by mouth every morning.  04/17/17  Yes [provider]  calcium-vitamin D (OSCAL-500) 500-400 MG-UNIT tablet Take 1 tablet by mouth 2 (two) times daily.   Yes [provider]  dapsone 100 MG tablet Take 100 mg by mouth daily.   Yes [provider]  folic acid (FOLVITE) 1 MG tablet Take 1 mg by mouth daily.   Yes [provider]  glucose monitoring kit (FREESTYLE) monitoring kit 1 each by Does not apply route as needed for other.   Yes  [provider]  insulin detemir (LEVEMIR) 100 UNIT/ML injection Inject 20 Units into the skin at bedtime.   Yes [provider]  insulin lispro (HUMALOG) 100 UNIT/ML injection Inject 6 Units into the skin 3 (three) times daily before meals.   Yes [provider]  levothyroxine (SYNTHROID) 75 MCG tablet TAKE 1 TABLT BY MOUTH ONCE DAILY IN THE MORNING ON AN EMPTY STOMACH WITH A GLASS OF WATER. 30-60 MINUTES BEFORE BREAKFAST. 08/14/18  Yes Derek Jack, MD  lidocaine 2 % SOLN, M-Dryl 12.5 MG/5ML LIQD, nystatin 100000 UNIT/ML SUSP solution SWISH WITH 5MLS THREE TIMES DAILY AS NEEDED FOR MOUTH PAIN. 06/15/19  Yes Lockamy, Randi L, NP-C  loperamide (IMODIUM) 2 MG capsule Take 2 mg by mouth as needed for diarrhea or loose stools.   Yes [provider]  magnesium oxide (MAG-OX) 400 (241.3 Mg) MG tablet TAKE 1 TABLET BY MOUTH TWICE DAILY 05/21/18  Yes Lockamy, Randi L, NP-C  metFORMIN (GLUCOPHAGE) 1000 MG tablet Take 1,000 mg by mouth 2 (two) times daily with a meal.  10/17/17  Yes [provider]  Posaconazole (NOXAFIL PO) Take 300 mg by mouth.   Yes [provider]  promethazine (PHENERGAN) 25 MG tablet Take 25 mg by mouth every 6 (six) hours as needed for nausea or vomiting.   Yes [provider]  triamterene-hydrochlorothiazide (DYAZIDE) 37.5-25 MG capsule  Take 1 capsule by mouth daily.   Yes [provider]  vitamin B-12 (CYANOCOBALAMIN) 1000 MCG tablet Take 1 tablet (1,000 mcg total) by mouth daily. 01/24/18  Yes Gherghe, Vella Redhead, MD  vitamin C (ASCORBIC ACID) 500 MG tablet Take 500 mg by mouth 2 (two) times daily.   Yes [provider]  cyclophosphamide in sodium chloride 0.9 % 250 mL Inject 1,260 mg into the vein every 21 ( twenty-one) days. Patient not taking: Reported on 01/12/2021    [provider]  DOXOrubicin HCl (ADRIAMYCIN IV) Inject 84 mg into the vein every 21 ( twenty-one) days. Patient not taking:  Reported on 01/12/2021    [provider]  ETOPOSIDE IV Inject 170 mg into the vein every 21 ( twenty-one) days. Days 1-3 Patient not taking: Reported on 01/12/2021    [provider]  feeding supplement, GLUCERNA SHAKE, (GLUCERNA SHAKE) LIQD Take 237 mLs by mouth 3 (three) times daily between meals. Patient not taking: Reported on 01/12/2021 05/30/18   Debbe Odea, MD  furosemide (LASIX) 20 MG tablet Take 1 tablet (20 mg total) by mouth daily as needed. 01/28/18   Derek Jack, MD  lidocaine-prilocaine (EMLA) cream Apply 1 application topically as needed. 04/01/18   Derek Jack, MD  magic mouthwash SOLN Take 5 mLs by mouth 3 (three) times daily as needed for mouth pain. With lidocaine 1% 1:1 solution Patient not taking: Reported on 01/12/2021 08/10/18   Francene Finders L, NP-C  pegfilgrastim (NEULASTA) 6 MG/0.6ML injection Inject 6 mg into the skin See admin instructions. To be administered 3 days after chemo therapy treatment Patient not taking: Reported on 01/12/2021    [provider]  potassium chloride (K-DUR) 10 MEQ tablet Take 40 meq today and then 10 meq daily. Patient not taking: Reported on 01/12/2021 05/30/18   Debbe Odea, MD  potassium chloride (K-DUR) 10 MEQ tablet  05/30/18   [provider]  potassium chloride SA (KLOR-CON) 20 MEQ tablet TAKE 1 TABLET BY MOUTH ONCE DAILY. Patient not taking: Reported on 01/12/2021 04/30/19   Francene Finders L, NP-C  predniSONE (DELTASONE) 20 MG tablet TAKE 5 TABLETS BY MOUTH ONCE DAILY ON DAYS 1-5 OF CHEMOTHERAPY. Patient not taking: Reported on 01/12/2021 05/01/18   Derek Jack, MD  prochlorperazine (COMPAZINE) 10 MG tablet Take 1 tablet (10 mg total) by mouth every 6 (six) hours as needed for nausea or vomiting. Patient not taking: Reported on 01/12/2021 01/28/18   Derek Jack, MD  valACYclovir (VALTREX) 500 MG tablet TAKE 1 TABLET BY MOUTH ONCE DAILY. Patient not taking: Reported  on 01/12/2021 04/30/18   Derek Jack, MD  vinCRIStine 2 mg in sodium chloride 0.9 % 50 mL Inject 2 mg into the vein every 21 ( twenty-one) days. Patient not taking: Reported on 01/12/2021    [provider]  zolpidem (AMBIEN) 10 MG tablet TAKE 1 TABLET BY MOUTH AT BEDTIME AS NEEDED FOR SLEEP Patient not taking: Reported on 01/12/2021 07/14/18   Derek Jack, MD    Allergies as of 01/01/2021 - Review Complete 06/17/2018  Allergen Reaction Noted   Doxycycline Anaphylaxis 01/18/2018   Penicillins Anaphylaxis 07/03/2010   Levofloxacin Nausea And Vomiting 01/09/2016   Sulfa antibiotics Itching and Rash 07/03/2010    Family History  Problem Relation Age of Onset   Heart disease Mother    Diabetes Father    Breast cancer Sister 30   Breast cancer Maternal Aunt    Colon cancer Neg Hx    Liver disease  Neg Hx     Social History   Socioeconomic History   Marital status: Married    Spouse name: Not on file   Number of children: 0   Years of education: Not on file   Highest education level: Not on file  Occupational History   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  Tobacco Use   Smoking status: Never   Smokeless tobacco: Never  Vaping Use   Vaping Use: Former  Substance and Sexual Activity   Alcohol use: Yes    Comment: once or twice a year   Drug use: No   Sexual activity: Not on file  Other Topics Concern   Not on file  Social History Narrative   Not on file   Social Determinants of Health   Financial Resource Strain: Not on file  Food Insecurity: Not on file  Transportation Needs: Not on file  Physical Activity: Not on file  Stress: Not on file  Social Connections: Not on file  Intimate Partner Violence: Not on file    Review of Systems: See HPI, otherwise negative ROS  Physical Exam: BP (!) 143/78    Pulse 79    Temp (!) 97.3 F (36.3 C) (Temporal)    Resp 16    Ht $R'5\' 3"'oF$  (1.6 m)    Wt 53.1 kg    SpO2 97%    BMI 20.73  kg/m  General:   Alert, cooperative in NAD Head:  Normocephalic and atraumatic. Respiratory:  Normal work of breathing. Cardiovascular:  RRR  Impression/Plan: Kristin Good is here for cataract surgery.  Risks, benefits, limitations, and alternatives regarding cataract surgery have been reviewed with the patient.  Questions have been answered.  All parties agreeable.   Benay Pillow, MD  01/22/2021, 7:17 AM

## 2021-01-23 ENCOUNTER — Encounter: Payer: Self-pay | Admitting: Ophthalmology

## 2021-01-24 ENCOUNTER — Encounter: Payer: Self-pay | Admitting: Ophthalmology

## 2021-02-01 NOTE — Discharge Instructions (Signed)

## 2021-02-05 ENCOUNTER — Ambulatory Visit
Admission: RE | Admit: 2021-02-05 | Discharge: 2021-02-05 | Disposition: A | Discharge status: Home / Self Care | Payer: Medicare Other | Attending: Ophthalmology | Admitting: Ophthalmology

## 2021-02-05 ENCOUNTER — Other Ambulatory Visit: Payer: Self-pay

## 2021-02-05 ENCOUNTER — Ambulatory Visit: Payer: Medicare Other | Admitting: Anesthesiology

## 2021-02-05 ENCOUNTER — Encounter
Admission: RE | Disposition: A | Discharge status: Home / Self Care | Payer: Self-pay | Source: Home / Self Care | Attending: Ophthalmology

## 2021-02-05 DIAGNOSIS — Z794 Long term (current) use of insulin: Secondary | ICD-10-CM | POA: Diagnosis not present

## 2021-02-05 DIAGNOSIS — E039 Hypothyroidism, unspecified: Secondary | ICD-10-CM | POA: Diagnosis not present

## 2021-02-05 DIAGNOSIS — E1136 Type 2 diabetes mellitus with diabetic cataract: Secondary | ICD-10-CM | POA: Insufficient documentation

## 2021-02-05 DIAGNOSIS — E113212 Type 2 diabetes mellitus with mild nonproliferative diabetic retinopathy with macular edema, left eye: Secondary | ICD-10-CM | POA: Insufficient documentation

## 2021-02-05 DIAGNOSIS — H2512 Age-related nuclear cataract, left eye: Secondary | ICD-10-CM | POA: Diagnosis present

## 2021-02-05 DIAGNOSIS — I10 Essential (primary) hypertension: Secondary | ICD-10-CM | POA: Insufficient documentation

## 2021-02-05 HISTORY — PX: CATARACT EXTRACTION W/PHACO: SHX586

## 2021-02-05 LAB — GLUCOSE, CAPILLARY
Glucose-Capillary: 125 mg/dL — ABNORMAL HIGH (ref 70–99)
Glucose-Capillary: 131 mg/dL — ABNORMAL HIGH (ref 70–99)

## 2021-02-05 SURGERY — PHACOEMULSIFICATION, CATARACT, WITH IOL INSERTION
Anesthesia: Monitor Anesthesia Care | Site: Eye | Laterality: Left

## 2021-02-05 MED ORDER — SIGHTPATH DOSE#1 BSS IO SOLN
INTRAOCULAR | Status: DC | PRN
  Administered 2021-02-05: 15 mL

## 2021-02-05 MED ORDER — SIGHTPATH DOSE#1 SODIUM HYALURONATE 10 MG/ML IO SOLUTION
PREFILLED_SYRINGE | INTRAOCULAR | Status: DC | PRN
Start: 2021-02-05 — End: 2021-02-05
  Administered 2021-02-05: 0.85 mL via INTRAOCULAR

## 2021-02-05 MED ORDER — SIGHTPATH DOSE#1 BSS IO SOLN
INTRAOCULAR | Status: DC | PRN
  Administered 2021-02-05: 68 mL via OPHTHALMIC

## 2021-02-05 MED ORDER — TETRACAINE HCL 0.5 % OP SOLN
1.0000 [drp] | OPHTHALMIC | Status: DC | PRN
  Administered 2021-02-05 (×3): 1 [drp] via OPHTHALMIC

## 2021-02-05 MED ORDER — MOXIFLOXACIN HCL 0.5 % OP SOLN
OPHTHALMIC | Status: DC | PRN
Start: 2021-02-05 — End: 2021-02-05
  Administered 2021-02-05: 0.2 mL via OPHTHALMIC

## 2021-02-05 MED ORDER — ACETAMINOPHEN 160 MG/5ML PO SOLN: 325.0000 mg | ORAL | Status: DC | PRN

## 2021-02-05 MED ORDER — ARMC OPHTHALMIC DILATING DROPS
1.0000 "application " | OPHTHALMIC | Status: DC | PRN
  Administered 2021-02-05 (×3): 1 via OPHTHALMIC

## 2021-02-05 MED ORDER — ACETAMINOPHEN 325 MG PO TABS: 325.0000 mg | ORAL_TABLET | ORAL | Status: DC | PRN

## 2021-02-05 MED ORDER — SIGHTPATH DOSE#1 SODIUM HYALURONATE 23 MG/ML IO SOLUTION
PREFILLED_SYRINGE | INTRAOCULAR | Status: DC | PRN
  Administered 2021-02-05: 0.6 mL via INTRAOCULAR

## 2021-02-05 MED ORDER — TRIAMCINOLONE ACETONIDE 40 MG/ML IJ SUSP
INTRAMUSCULAR | Status: DC | PRN
  Administered 2021-02-05: 40 mg

## 2021-02-05 MED ORDER — LIDOCAINE HCL (PF) 2 % IJ SOLN
INTRAOCULAR | Status: DC | PRN
  Administered 2021-02-05: 1 mL via INTRAOCULAR

## 2021-02-05 MED ORDER — MIDAZOLAM HCL 2 MG/2ML IJ SOLN
INTRAMUSCULAR | Status: DC | PRN
  Administered 2021-02-05: 2 mg via INTRAVENOUS

## 2021-02-05 MED ORDER — FENTANYL CITRATE (PF) 100 MCG/2ML IJ SOLN
INTRAMUSCULAR | Status: DC | PRN
  Administered 2021-02-05: 50 ug via INTRAVENOUS

## 2021-02-05 MED ORDER — LACTATED RINGERS IV SOLN: INTRAVENOUS | Status: DC

## 2021-02-05 SURGICAL SUPPLY — 16 items
CATARACT SUITE SIGHTPATH (MISCELLANEOUS) ×3 IMPLANT
DISSECTOR HYDRO NUCLEUS 50X22 (MISCELLANEOUS) ×3 IMPLANT
FEE CATARACT SUITE SIGHTPATH (MISCELLANEOUS) ×1 IMPLANT
GLOVE SURG GAMMEX PI TX LF 7.5 (GLOVE) ×3 IMPLANT
GLOVE SURG SYN 8.5  E (GLOVE) ×3
GLOVE SURG SYN 8.5 E (GLOVE) ×1 IMPLANT
GLOVE SURG SYN 8.5 PF PI (GLOVE) ×1 IMPLANT
LENS IOL EYHANCE TORIC II 22.5 ×3 IMPLANT
LENS IOL EYHANCE TRC 150 22.5 IMPLANT
LENS IOL EYHNC TORIC 150 22.5 ×1 IMPLANT
NDL FILTER BLUNT 18X1 1/2 (NEEDLE) ×1 IMPLANT
NEEDLE FILTER BLUNT 18X 1/2SAF (NEEDLE) ×2
NEEDLE FILTER BLUNT 18X1 1/2 (NEEDLE) ×1 IMPLANT
SYR 3ML LL SCALE MARK (SYRINGE) ×3 IMPLANT
SYR 5ML LL (SYRINGE) ×3 IMPLANT
WATER STERILE IRR 250ML POUR (IV SOLUTION) ×3 IMPLANT

## 2021-02-05 NOTE — Anesthesia Postprocedure Evaluation (Signed)
Anesthesia Post Note  Patient: Kristin Good  Procedure(s) Performed: CATARACT EXTRACTION PHACO AND INTRAOCULAR LENS PLACEMENT (IOC) DM LEFT INTRAVITREAL KENALOG INJECTION (Left: Eye)     Patient location during evaluation: PACU Anesthesia Type: MAC Level of consciousness: awake and alert Pain management: pain level controlled Vital Signs Assessment: post-procedure vital signs reviewed and stable Respiratory status: spontaneous breathing, nonlabored ventilation, respiratory function stable and patient connected to nasal cannula oxygen Cardiovascular status: stable and blood pressure returned to baseline Postop Assessment: no apparent nausea or vomiting Anesthetic complications: no   No notable events documented.  Trecia Rogers

## 2021-02-05 NOTE — H&P (Signed)
[facility]   Primary Care Physician:  Marinda Elk, MD Ophthalmologist: Dr. Benay Pillow  Pre-Procedure History & Physical: HPI:  Kristin Good is a 61 y.o. female here for cataract surgery.   Past Medical History:  Diagnosis Date   Diabetes ([place]) 01/20/2018   Heart murmur    "   HTN (hypertension)    Hypothyroidism    Leukemia ([place])    Atypical T cell lymphoma    Past Surgical History:  Procedure Laterality Date   CATARACT EXTRACTION W/PHACO Right 01/22/2021   Procedure: CATARACT EXTRACTION PHACO AND INTRAOCULAR LENS PLACEMENT (IOC) DM RIGHT INTRAVITREAL KENALOG INJECTION 4.34 00:33.4;  Surgeon: Eulogio Bear, MD;  Location: [place];  Service: Ophthalmology;  Laterality: Right;  Diabetic   COLONOSCOPY  07/27/2010   Procedure: COLONOSCOPY;  Surgeon: Dorothyann Peng, MD;  [facility];  Service: Endoscopy;  Laterality: N/A;   infertility surgery     MOHS SURGERY     basal cell carcinoma   PORTACATH PLACEMENT Left 04/01/2018   Procedure: INSERTION PORT-A-CATH;  Surgeon: Aviva Signs, MD;  Location: [facility];  Service: General;  Laterality: Left;   REDUCTION MAMMAPLASTY Bilateral 2002    Prior to Admission medications   Medication Sig Start Date End Date Taking? Authorizing Provider  acetaminophen (TYLENOL) 325 MG tablet Take 650 mg by mouth every 6 (six) hours as needed.   Yes [provider]  acyclovir (ZOVIRAX) 400 MG tablet Take 400 mg by mouth 5 (five) times daily.   Yes [provider]  Alemtuzumab (CAMPATH IV) Inject into the vein. Every 3 weeks   Yes [provider]  ALPRAZolam (XANAX) 1 MG tablet Take 1 mg by mouth at bedtime as needed for anxiety.   Yes [provider]  atorvastatin (LIPITOR) 20 MG tablet Take 20 mg by mouth every morning.  04/17/17  Yes [provider]  dapsone 100 MG tablet Take 100 mg by mouth daily.   Yes [provider]  folic acid (FOLVITE) 1 MG tablet Take  1 mg by mouth daily.   Yes [provider]  insulin detemir (LEVEMIR) 100 UNIT/ML injection Inject 20 Units into the skin at bedtime.   Yes [provider]  insulin lispro (HUMALOG) 100 UNIT/ML injection Inject 6 Units into the skin 3 (three) times daily before meals.   Yes [provider]  levothyroxine (SYNTHROID) 75 MCG tablet TAKE 1 TABLT BY MOUTH ONCE DAILY IN THE MORNING ON AN EMPTY STOMACH WITH A GLASS OF WATER. 30-60 MINUTES BEFORE BREAKFAST. 08/14/18  Yes Derek Jack, MD  loperamide (IMODIUM) 2 MG capsule Take 2 mg by mouth as needed for diarrhea or loose stools.   Yes [provider]  magnesium oxide (MAG-OX) 400 (241.3 Mg) MG tablet TAKE 1 TABLET BY MOUTH TWICE DAILY 05/21/18  Yes Lockamy, Randi L, NP-C  metFORMIN (GLUCOPHAGE) 1000 MG tablet Take 1,000 mg by mouth 2 (two) times daily with a meal.  10/17/17  Yes [provider]  Posaconazole (NOXAFIL PO) Take 300 mg by mouth.   Yes [provider]  promethazine (PHENERGAN) 25 MG tablet Take 25 mg by mouth every 6 (six) hours as needed for nausea or vomiting.   Yes [provider]  triamterene-hydrochlorothiazide (DYAZIDE) 37.5-25 MG capsule Take 1 capsule by mouth daily.   Yes [provider]  vitamin B-12 (CYANOCOBALAMIN) 1000 MCG tablet Take 1 tablet (1,000 mcg total) by mouth daily. 01/24/18  Yes Gherghe, Costin M,  MD  vitamin C (ASCORBIC ACID) 500 MG tablet Take 500 mg by mouth 2 (two) times daily.   Yes [provider]  calcium-vitamin D (OSCAL-500) 500-400 MG-UNIT tablet Take 1 tablet by mouth 2 (two) times daily.    [provider]  cyclophosphamide in sodium chloride 0.9 % 250 mL Inject 1,260 mg into the vein every 21 ( twenty-one) days. Patient not taking: Reported on 01/12/2021    [provider]  DOXOrubicin HCl (ADRIAMYCIN IV) Inject 84 mg into the vein every 21 ( twenty-one) days. Patient not taking: Reported on 01/12/2021     [provider]  ETOPOSIDE IV Inject 170 mg into the vein every 21 ( twenty-one) days. Days 1-3 Patient not taking: Reported on 01/12/2021    [provider]  feeding supplement, GLUCERNA SHAKE, (GLUCERNA SHAKE) LIQD Take 237 mLs by mouth 3 (three) times daily between meals. Patient not taking: Reported on 01/12/2021 05/30/18   Debbe Odea, MD  furosemide (LASIX) 20 MG tablet Take 1 tablet (20 mg total) by mouth daily as needed. 01/28/18   Derek Jack, MD  glucose monitoring kit (FREESTYLE) monitoring kit 1 each by Does not apply route as needed for other.    [provider]  lidocaine 2 % SOLN, M-Dryl 12.5 MG/5ML LIQD, nystatin 100000 UNIT/ML SUSP solution SWISH WITH 5MLS THREE TIMES DAILY AS NEEDED FOR MOUTH PAIN. 06/15/19   Lockamy, Randi L, NP-C  lidocaine-prilocaine (EMLA) cream Apply 1 application topically as needed. 04/01/18   Derek Jack, MD  magic mouthwash SOLN Take 5 mLs by mouth 3 (three) times daily as needed for mouth pain. With lidocaine 1% 1:1 solution Patient not taking: Reported on 01/12/2021 08/10/18   Francene Finders L, NP-C  pegfilgrastim (NEULASTA) 6 MG/0.6ML injection Inject 6 mg into the skin See admin instructions. To be administered 3 days after chemo therapy treatment Patient not taking: Reported on 01/12/2021    [provider]  potassium chloride (K-DUR) 10 MEQ tablet Take 40 meq today and then 10 meq daily. Patient not taking: Reported on 01/12/2021 05/30/18   Debbe Odea, MD  potassium chloride (K-DUR) 10 MEQ tablet  05/30/18   [provider]  potassium chloride SA (KLOR-CON) 20 MEQ tablet TAKE 1 TABLET BY MOUTH ONCE DAILY. Patient not taking: Reported on 01/12/2021 04/30/19   Francene Finders L, NP-C  predniSONE (DELTASONE) 20 MG tablet TAKE 5 TABLETS BY MOUTH ONCE DAILY ON DAYS 1-5 OF CHEMOTHERAPY. Patient not taking: Reported on 01/12/2021 05/01/18   Derek Jack, MD  prochlorperazine (COMPAZINE) 10  MG tablet Take 1 tablet (10 mg total) by mouth every 6 (six) hours as needed for nausea or vomiting. Patient not taking: Reported on 01/12/2021 01/28/18   Derek Jack, MD  valACYclovir (VALTREX) 500 MG tablet TAKE 1 TABLET BY MOUTH ONCE DAILY. Patient not taking: Reported on 01/12/2021 04/30/18   Derek Jack, MD  vinCRIStine 2 mg in sodium chloride 0.9 % 50 mL Inject 2 mg into the vein every 21 ( twenty-one) days. Patient not taking: Reported on 01/12/2021    [provider]  zolpidem (AMBIEN) 10 MG tablet TAKE 1 TABLET BY MOUTH AT BEDTIME AS NEEDED FOR SLEEP Patient not taking: Reported on 01/12/2021 07/14/18   Derek Jack, MD    Allergies as of 01/01/2021 - Review Complete 06/17/2018  Allergen Reaction Noted   Doxycycline Anaphylaxis 01/18/2018   Penicillins Anaphylaxis 07/03/2010   Levofloxacin Nausea And Vomiting 01/09/2016   Sulfa antibiotics Itching and Rash 07/03/2010  Family History  Problem Relation Age of Onset   Heart disease Mother    Diabetes Father    Breast cancer Sister 66   Breast cancer Maternal Aunt    Colon cancer Neg Hx    Liver disease Neg Hx     Social History   Socioeconomic History   Marital status: Married    Spouse name: Not on file   Number of children: 0   Years of education: Not on file   Highest education level: Not on file  Occupational History   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  Tobacco Use   Smoking status: Never   Smokeless tobacco: Never  Vaping Use   Vaping Use: Former  Substance and Sexual Activity   Alcohol use: Yes    Comment: once or twice a year   Drug use: No   Sexual activity: Not on file  Other Topics Concern   Not on file  Social History Narrative   Not on file   Social Determinants of Health   Financial Resource Strain: Not on file  Food Insecurity: Not on file  Transportation Needs: Not on file  Physical Activity: Not on file  Stress: Not on file  Social  Connections: Not on file  Intimate Partner Violence: Not on file    Review of Systems: See HPI, otherwise negative ROS  Physical Exam: BP 132/73    Pulse 75    Temp (!) 97.5 F (36.4 C) (Temporal)    Resp 16    Ht _0  (1.6 m)    Wt 51.3 kg    SpO2 100%    BMI 20.02 kg/m  General:   Alert, cooperative in NAD Head:  Normocephalic and atraumatic. Respiratory:  Normal work of breathing. Cardiovascular:  RRR  Impression/Plan: Kristin Good is here for cataract surgery.  Risks, benefits, limitations, and alternatives regarding cataract surgery have been reviewed with the patient.  Questions have been answered.  All parties agreeable.   Benay Pillow, MD  02/05/2021, 8:51 AM

## 2021-02-05 NOTE — Op Note (Signed)
OPERATIVE NOTE  KARN DERK [id number] 02/05/2021   PREOPERATIVE DIAGNOSIS:   1.  Nuclear sclerotic cataract left eye.  H25.12 2.  Nonproliferative diabetic retinopathy with macular edema, left eye. E08.1448   POSTOPERATIVE DIAGNOSIS:     Same.   PROCEDURE:    Phacoemusification with posterior chamber intraocular lens placement of the left eye  Intravitreal injection of kenalog CPT [id number]  LENS:   Implant Name Type Inv. Item Serial No. Manufacturer Lot No. LRB No. Used Action  LENS IOL EYHANCE TORIC II 22.5 - [id number]  LENS IOL Tyler Deis II 22.5 [id number] SIGHTPATH  Left 1 Implanted      Procedure(s) with comments: CATARACT EXTRACTION PHACO AND INTRAOCULAR LENS PLACEMENT (IOC) DM LEFT INTRAVITREAL KENALOG INJECTION (Left) - 1.68 00:28.5  [id number] +22.5   ULTRASOUND TIME: 0 minutes 28 seconds.  CDE 1.68   SURGEON:  Benay Pillow, MD, MPH   ANESTHESIA:  Topical with tetracaine drops augmented with 1% preservative-free intracameral lidocaine.  ESTIMATED BLOOD LOSS: <1 mL   COMPLICATIONS:  None.   DESCRIPTION OF PROCEDURE:  The patient was identified in the holding room and transported to the operating room and placed in the supine position under the operating microscope.  The left eye was identified as the operative eye and it was prepped and draped in the usual sterile ophthalmic fashion.  The verion system was registered without difficulty.   A 1.0 millimeter clear-corneal paracentesis was made at the 5:00 position. 0.5 ml of preservative-free 1% lidocaine with epinephrine was injected into the anterior chamber.  The anterior chamber was filled with Healon 5 viscoelastic.  A 2.4 millimeter keratome was used to make a near-clear corneal incision at the 2:00 position.  A curvilinear capsulorrhexis was made with a cystotome and capsulorrhexis forceps.  Balanced salt solution was used to hydrodissect and hydrodelineate the nucleus.   Phacoemulsification was then used in  stop and chop fashion to remove the lens nucleus and epinucleus.  The remaining cortex was then removed using the irrigation and aspiration handpiece. Healon was then placed into the capsular bag to distend it for lens placement.  A lens was then injected into the capsular bag.  The remaining viscoelastic was aspirated.  The lens was rotated with guidance from the [place] system and well aligned at 095.  A photo was taken for verification.     Wounds were hydrated with balanced salt solution.  The anterior chamber was inflated to a physiologic pressure with balanced salt solution.  Calipers were used to mark 3.5 mm posterior to the limbus in the inferotemporal quadrant.  0.1 mL of Kenalog 40 mg/mL was injected into the vitreous cavity.  Intracameral vigamox 0.1 mL undiltued was injected into the eye and a drop placed onto the ocular surface.  No wound leaks were noted.  The patient was taken to the recovery room in stable condition without complications of anesthesia or surgery  Benay Pillow 02/05/2021, 9:32 AM

## 2021-02-05 NOTE — Transfer of Care (Signed)
Immediate Anesthesia Transfer of Care Note  Patient: Kristin Good  Procedure(s) Performed: CATARACT EXTRACTION PHACO AND INTRAOCULAR LENS PLACEMENT (IOC) DM LEFT INTRAVITREAL KENALOG INJECTION (Left: Eye)  Patient Location: PACU  Anesthesia Type: MAC  Level of Consciousness: awake, alert  and patient cooperative  Airway and Oxygen Therapy: Patient Spontanous Breathing and Patient connected to supplemental oxygen  Post-op Assessment: Post-op Vital signs reviewed, Patient's Cardiovascular Status Stable, Respiratory Function Stable, Patent Airway and No signs of Nausea or vomiting  Post-op Vital Signs: Reviewed and stable  Complications: No notable events documented.

## 2021-02-05 NOTE — Anesthesia Preprocedure Evaluation (Signed)
Anesthesia Evaluation  Patient identified by MRN, date of birth, ID band Patient awake    Reviewed: Allergy & Precautions, H&P , NPO status , Patient's Chart, lab work & pertinent test results, reviewed documented beta blocker date and time   Airway Mallampati: II  TM Distance: >3 FB Neck ROM: full    Dental no notable dental hx.    Pulmonary neg pulmonary ROS,    Pulmonary exam normal breath sounds clear to auscultation       Cardiovascular Exercise Tolerance: Good hypertension, Normal cardiovascular exam Rhythm:regular Rate:Normal     Neuro/Psych negative neurological ROS  negative psych ROS   GI/Hepatic negative GI ROS, Neg liver ROS,   Endo/Other  diabetes, Insulin DependentHypothyroidism   Renal/GU negative Renal ROS  negative genitourinary   Musculoskeletal   Abdominal   Peds  Hematology Hx of lymphoma   Anesthesia Other Findings   Reproductive/Obstetrics negative OB ROS                             Anesthesia Physical Anesthesia Plan  ASA: 3  Anesthesia Plan: MAC   Post-op Pain Management:    Induction:   PONV Risk Score and Plan:   Airway Management Planned:   Additional Equipment:   Intra-op Plan:   Post-operative Plan:   Informed Consent: I have reviewed the patients History and Physical, chart, labs and discussed the procedure including the risks, benefits and alternatives for the proposed anesthesia with the patient or authorized representative who has indicated his/her understanding and acceptance.     Dental Advisory Given  Plan Discussed with: CRNA and Anesthesiologist  Anesthesia Plan Comments:         Anesthesia Quick Evaluation

## 2021-02-06 ENCOUNTER — Encounter: Payer: Self-pay | Admitting: Ophthalmology

## 2021-08-06 ENCOUNTER — Other Ambulatory Visit: Payer: Self-pay

## 2021-08-23 ENCOUNTER — Other Ambulatory Visit: Payer: Self-pay
